# Patient Record
Sex: Female | Born: 1965 | Race: Black or African American | Hispanic: No | Marital: Single | Smoking: Former smoker
Health system: Southern US, Community
[De-identification: ages and names within clinical notes are randomized; demographics above are authoritative.]

## PROBLEM LIST (undated history)

## (undated) DIAGNOSIS — K219 Gastro-esophageal reflux disease without esophagitis: Secondary | ICD-10-CM

## (undated) DIAGNOSIS — G63 Polyneuropathy in diseases classified elsewhere: Secondary | ICD-10-CM

## (undated) DIAGNOSIS — Z21 Asymptomatic human immunodeficiency virus [HIV] infection status: Secondary | ICD-10-CM

## (undated) DIAGNOSIS — B20 Human immunodeficiency virus [HIV] disease: Secondary | ICD-10-CM

## (undated) DIAGNOSIS — F1411 Cocaine abuse, in remission: Secondary | ICD-10-CM

## (undated) DIAGNOSIS — R51 Headache: Secondary | ICD-10-CM

## (undated) DIAGNOSIS — I219 Acute myocardial infarction, unspecified: Secondary | ICD-10-CM

## (undated) DIAGNOSIS — I639 Cerebral infarction, unspecified: Secondary | ICD-10-CM

## (undated) DIAGNOSIS — D649 Anemia, unspecified: Secondary | ICD-10-CM

## (undated) DIAGNOSIS — K430 Incisional hernia with obstruction, without gangrene: Secondary | ICD-10-CM

## (undated) DIAGNOSIS — I10 Essential (primary) hypertension: Secondary | ICD-10-CM

## (undated) DIAGNOSIS — B029 Zoster without complications: Secondary | ICD-10-CM

## (undated) DIAGNOSIS — R519 Headache, unspecified: Secondary | ICD-10-CM

## (undated) DIAGNOSIS — M9989 Other biomechanical lesions of abdomen and other regions: Secondary | ICD-10-CM

## (undated) DIAGNOSIS — M62838 Other muscle spasm: Secondary | ICD-10-CM

## (undated) DIAGNOSIS — R221 Localized swelling, mass and lump, neck: Secondary | ICD-10-CM

## (undated) HISTORY — DX: Anemia, unspecified: D64.9

## (undated) HISTORY — DX: Human immunodeficiency virus (HIV) disease: B20

## (undated) HISTORY — DX: Localized swelling, mass and lump, neck: R22.1

## (undated) HISTORY — DX: Polyneuropathy in diseases classified elsewhere: G63

## (undated) HISTORY — DX: Other biomechanical lesions of abdomen and other regions: M99.89

## (undated) HISTORY — DX: Cerebral infarction, unspecified: I63.9

## (undated) HISTORY — DX: Other muscle spasm: M62.838

---

## 1986-11-01 HISTORY — PX: TONSILLECTOMY: SUR1361

## 1991-11-02 HISTORY — PX: ABDOMINAL HYSTERECTOMY: SHX81

## 2004-01-02 ENCOUNTER — Encounter (INDEPENDENT_AMBULATORY_CARE_PROVIDER_SITE_OTHER): Payer: Self-pay | Admitting: Infectious Diseases

## 2004-01-02 ENCOUNTER — Encounter (INDEPENDENT_AMBULATORY_CARE_PROVIDER_SITE_OTHER): Payer: Self-pay | Admitting: *Deleted

## 2004-01-02 ENCOUNTER — Encounter: Admission: RE | Admit: 2004-01-02 | Discharge: 2004-01-02 | Payer: Self-pay | Admitting: Infectious Diseases

## 2004-01-02 ENCOUNTER — Inpatient Hospital Stay (HOSPITAL_COMMUNITY): Admission: RE | Admit: 2004-01-02 | Discharge: 2004-01-20 | Payer: Self-pay | Admitting: Infectious Diseases

## 2004-01-02 LAB — CONVERTED CEMR LAB
CD4 Count: 60 microliters
CD4 T Cell Abs: 60

## 2004-01-04 IMAGING — CR DG CHEST 2V
2 series · 2 of 2 positions shown · non-contrast
Comparison: none

CLINICAL DATA: Chest pain with cough. 
 PA AND LATERAL CHEST [DATE]
 Without comparison studies. 
 Bronchial thickening with bibasilar subsegmental atelectasis.  Negative for focal infiltrates or effusions.  The heart is normal.  Mediastinum and hila are negative for adenopathy.  
 IMPRESSION
 Bronchitis, acute versus chronic with basilar subsegmental atelectasis.

[view not recorded (1 of 2)]
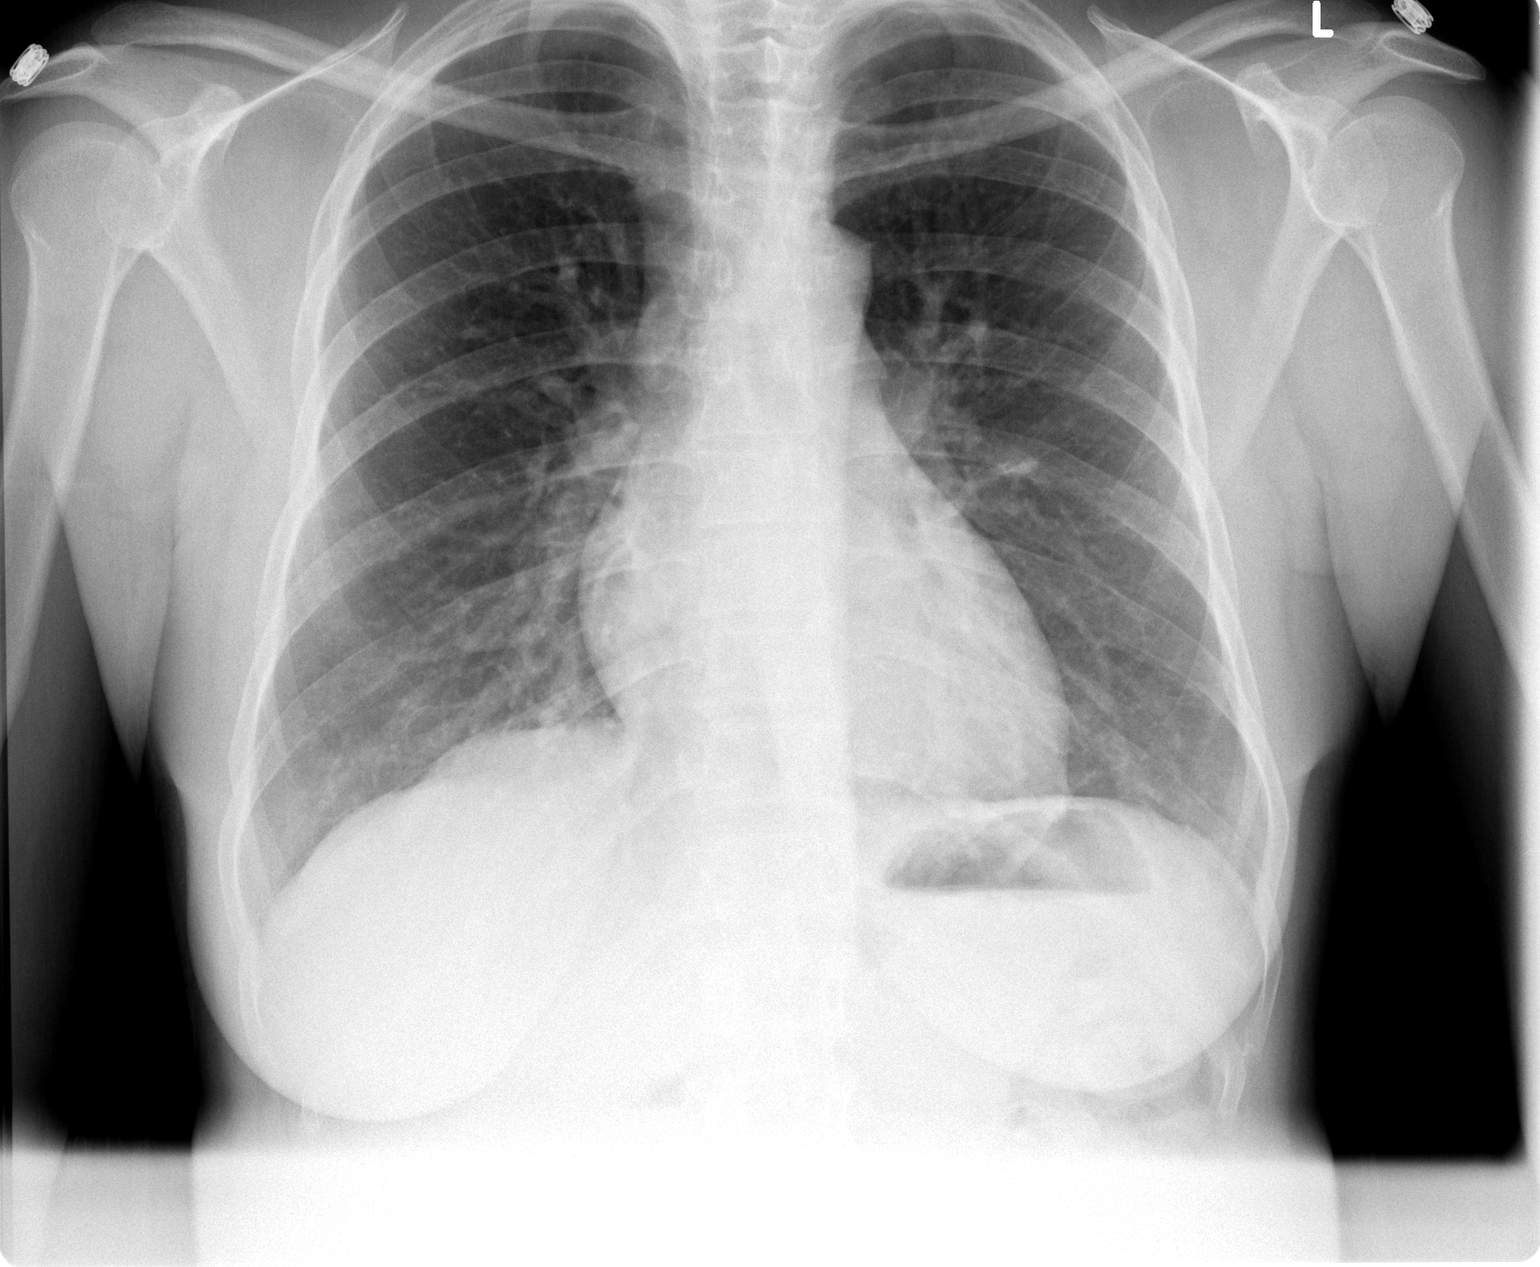

[view not recorded (2 of 2)]
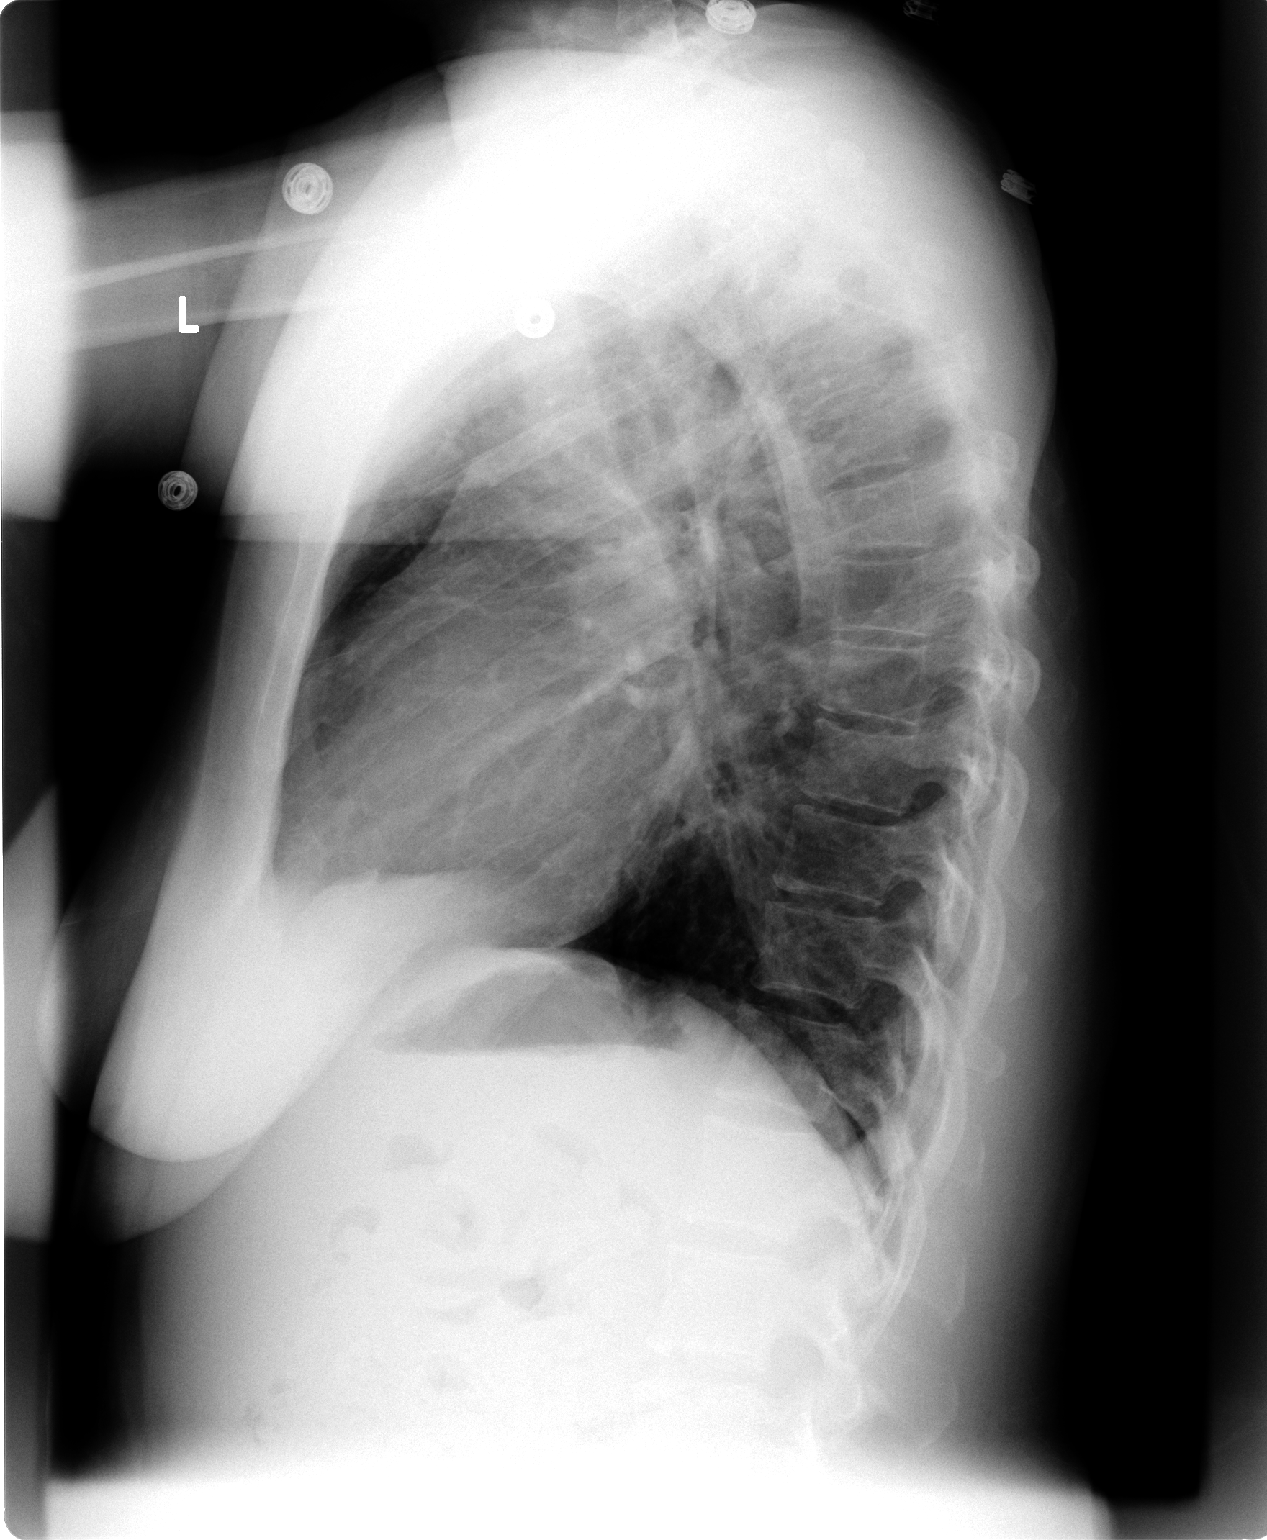

[2 of 2 positions shown; findings below may reference images not displayed]

## 2004-01-10 IMAGING — CR DG CHEST 2V
2 series · 2 of 2 positions shown · non-contrast
Comparison: none

CLINICAL DATA: Staph infection.
 PA AND LATERAL CHEST, [DATE]
 Compare study [DATE]
 The lungs are clear.  Cardiac and mediastinal contours are normal.  Osseous structures unremarkable.
 IMPRESSION 
 Negative for acute cardiac or pulmonary process.

[view not recorded (1 of 2)]
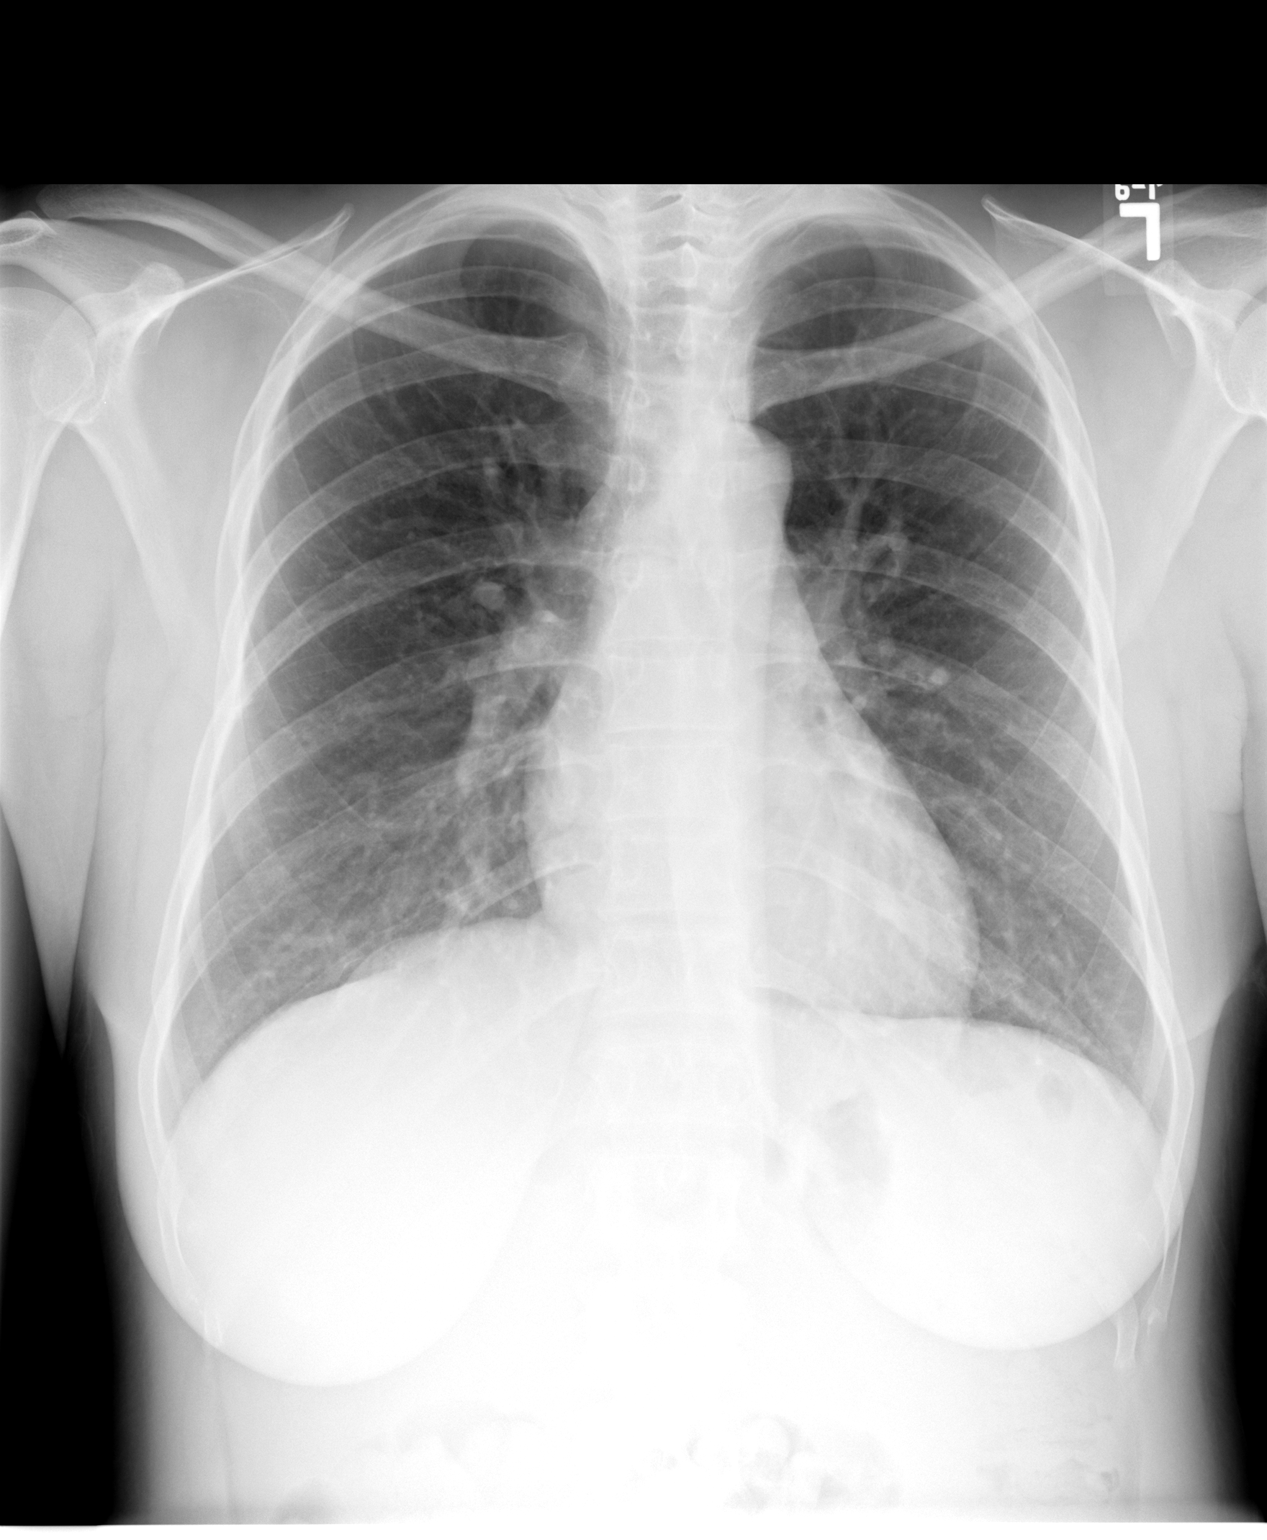

[view not recorded (2 of 2)]
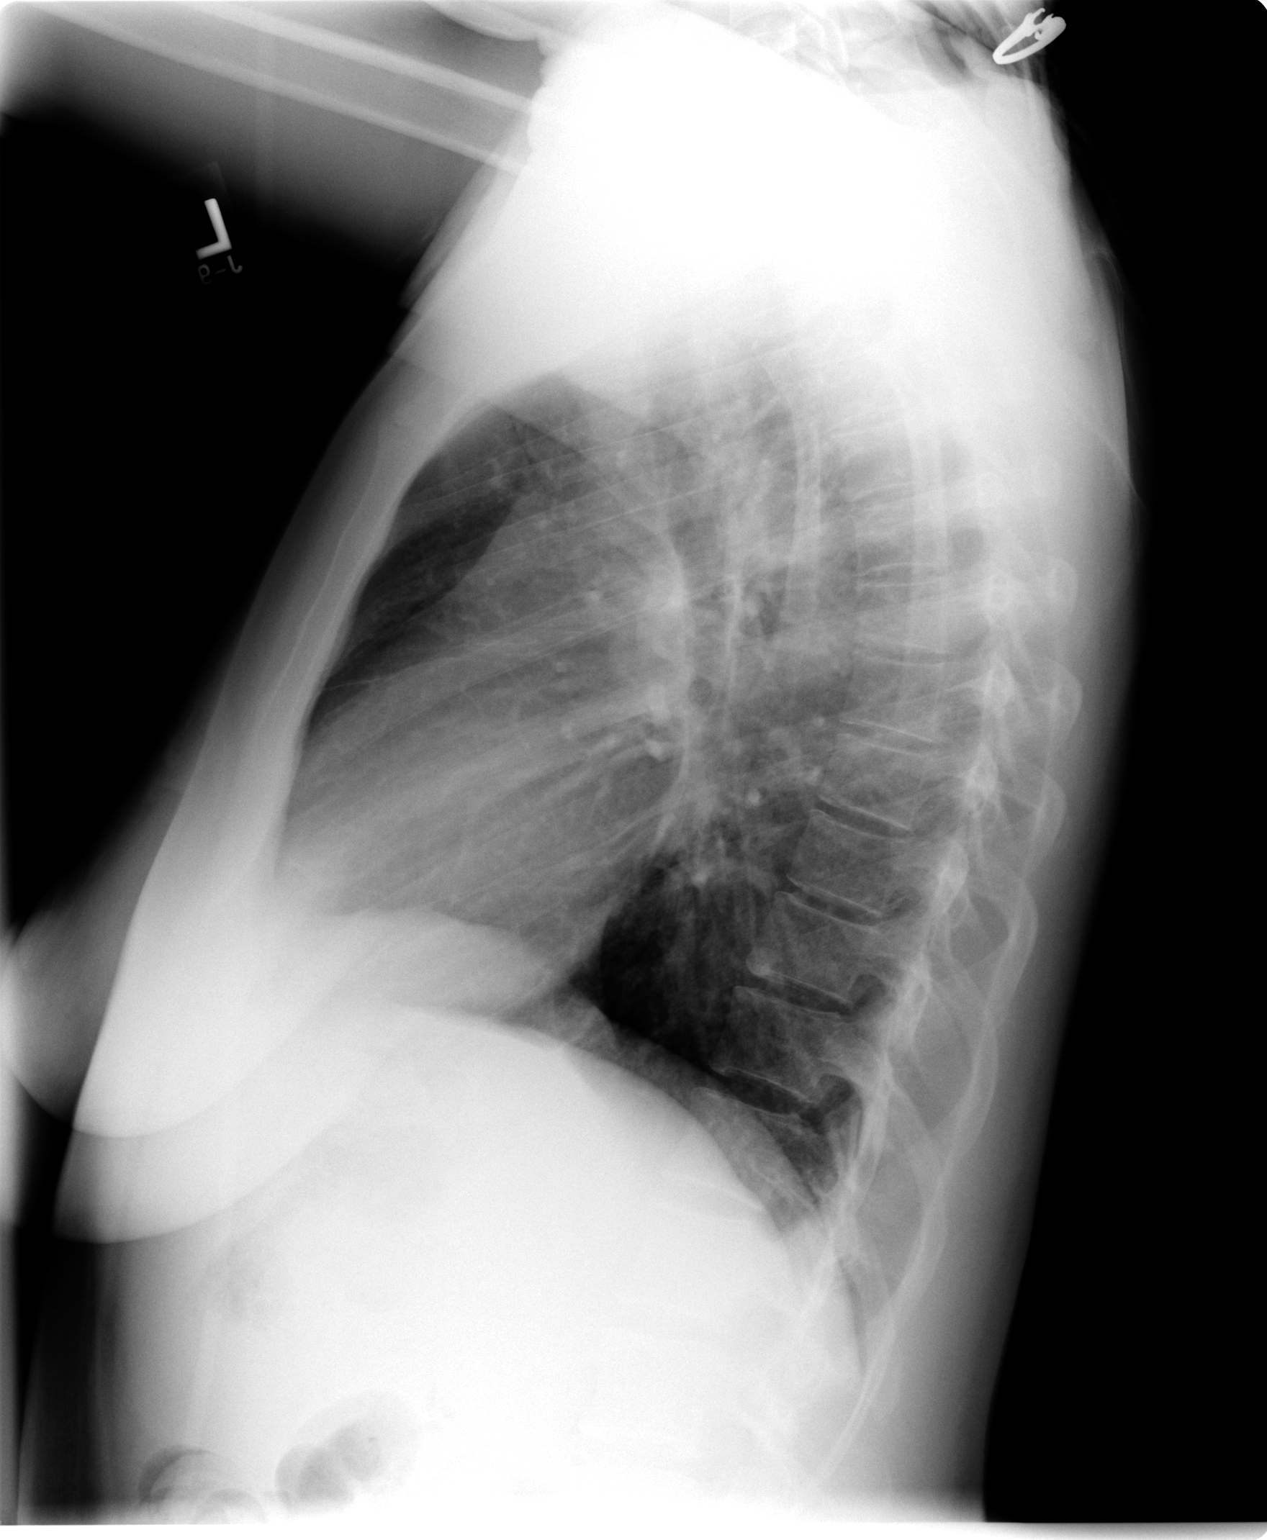

[2 of 2 positions shown; findings below may reference images not displayed]

## 2004-01-11 IMAGING — US US ABDOMEN COMPLETE
1 series · 14 of 25 positions shown · non-contrast
Comparison: none

CLINICAL DATA: Right upper abdominal pain.  
 ABDOMEN ULTRASOUND 
 There is no evidence of gallstones or gallbladder wall thickening. There is no evidence of biliary ductal dilatation. The liver is within normal limits in echogenicity and no focal parenchymal lesions are identified. The visualized portion of the pancreas is unremarkable in appearance.

[Series 1: abdomen · 0.27mm/px · 14 of 68 slices shown]
[im 1/68]
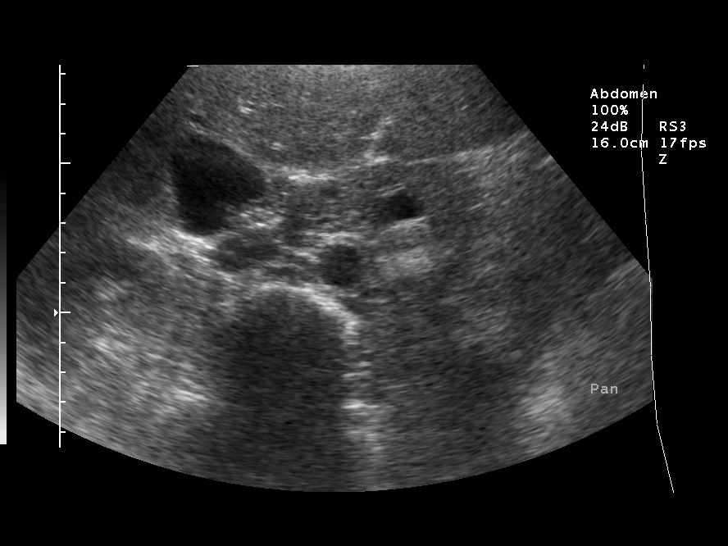
[im 6/68]
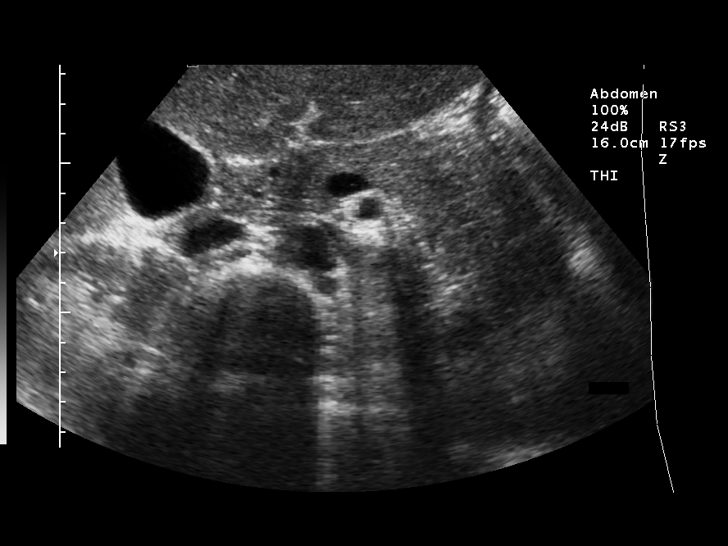
[im 12/68]
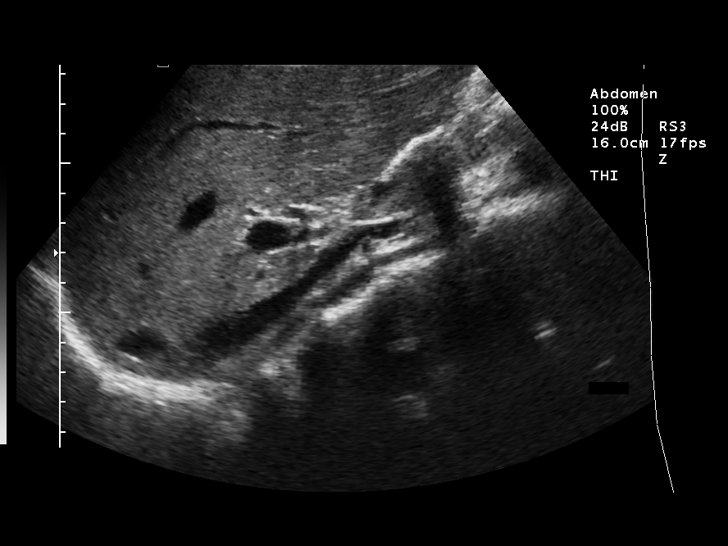
[im 17/68]
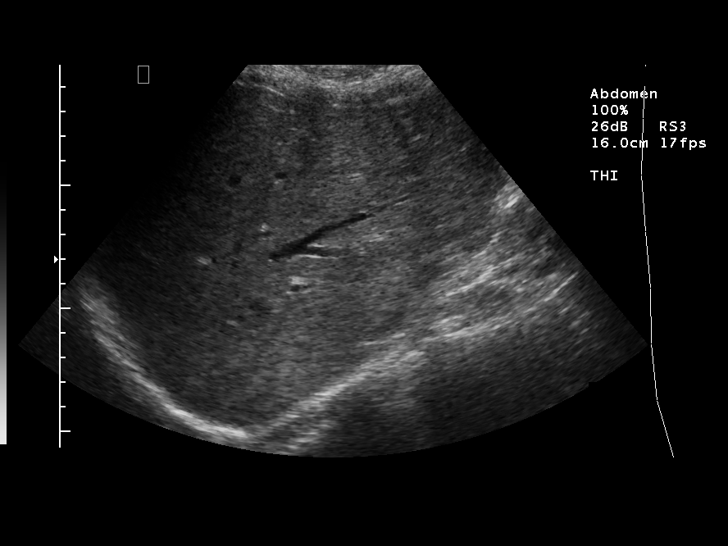
[im 23/68]
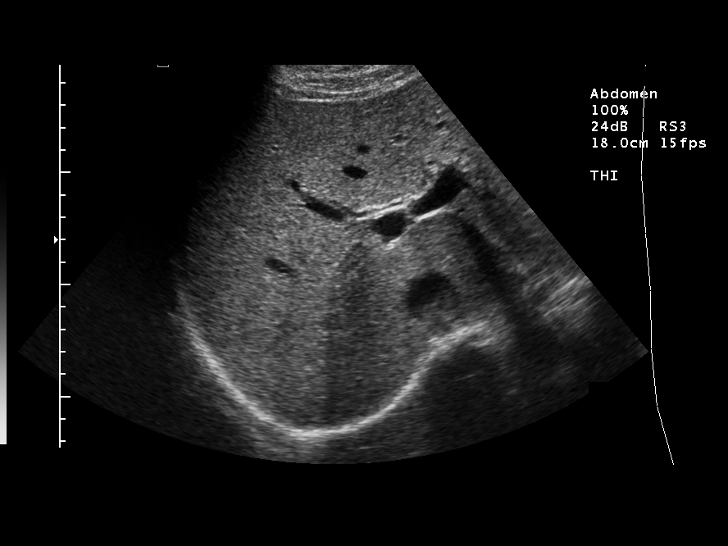
[im 26/68]
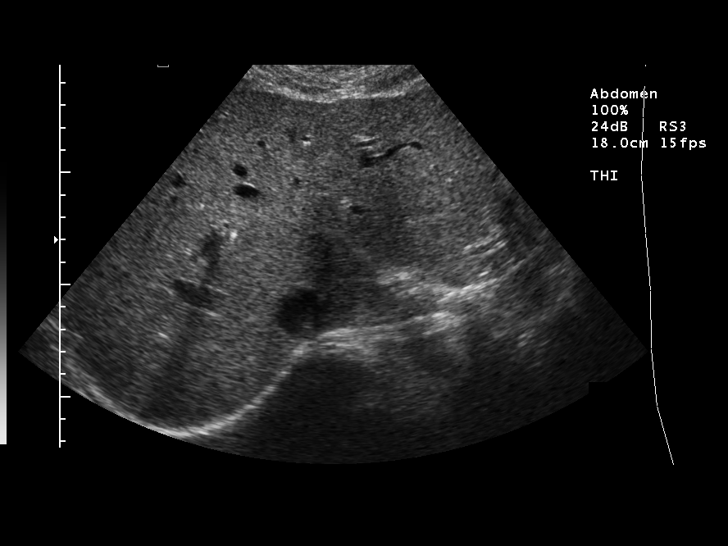
[im 31/68]
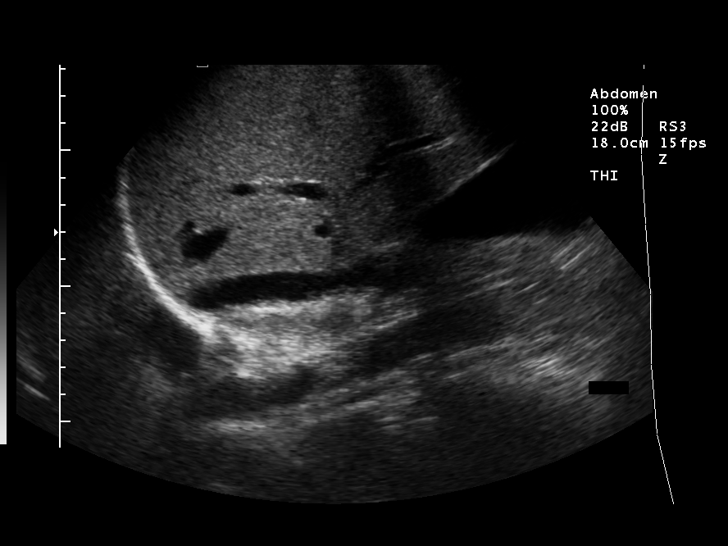
[im 37/68]
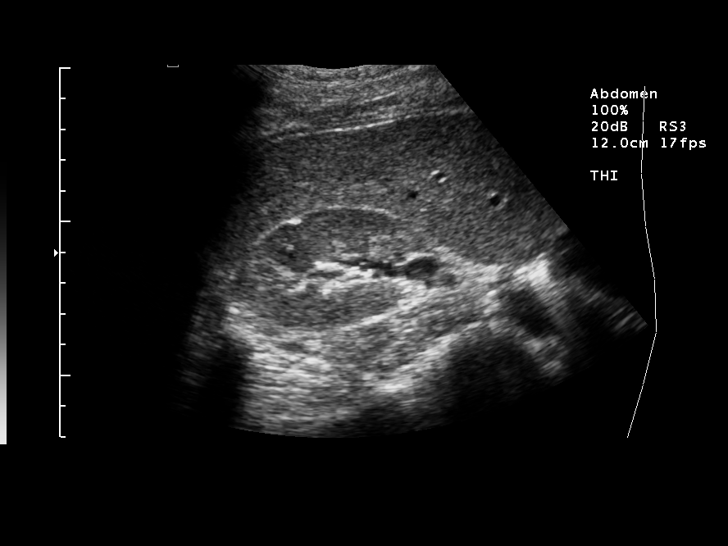
[im 42/68]
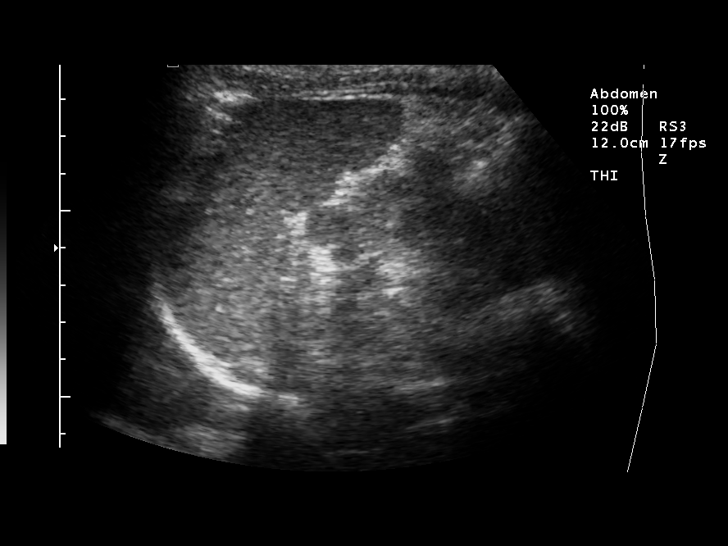
[im 45/68]
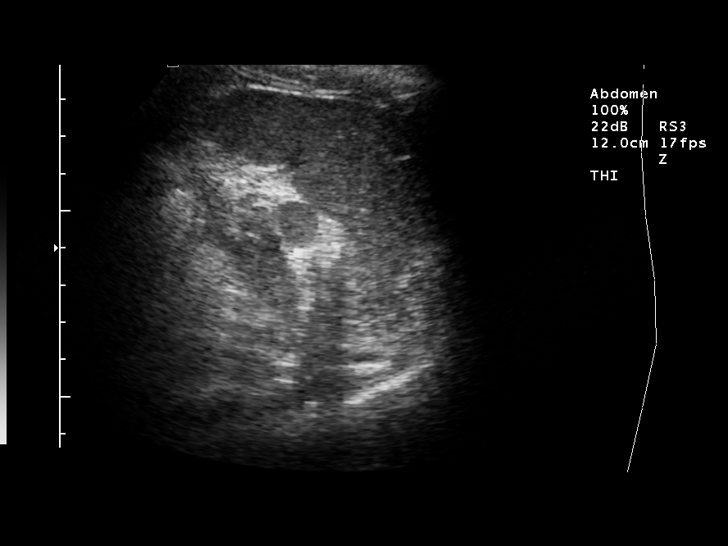
[im 51/68]
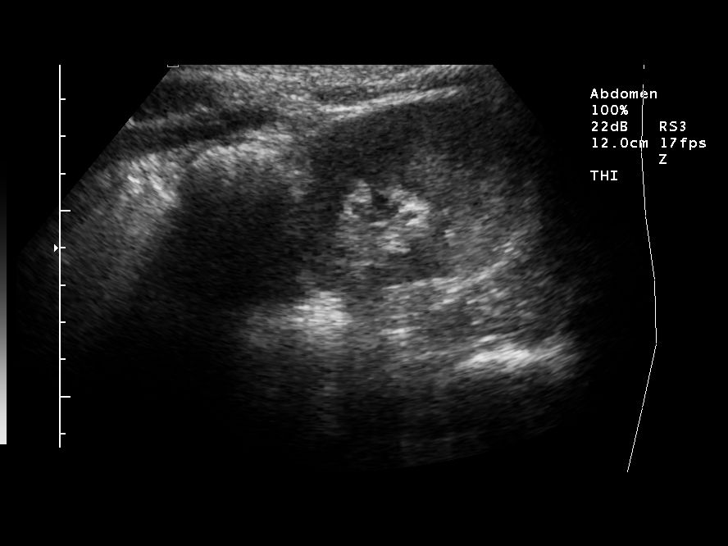
[im 56/68]
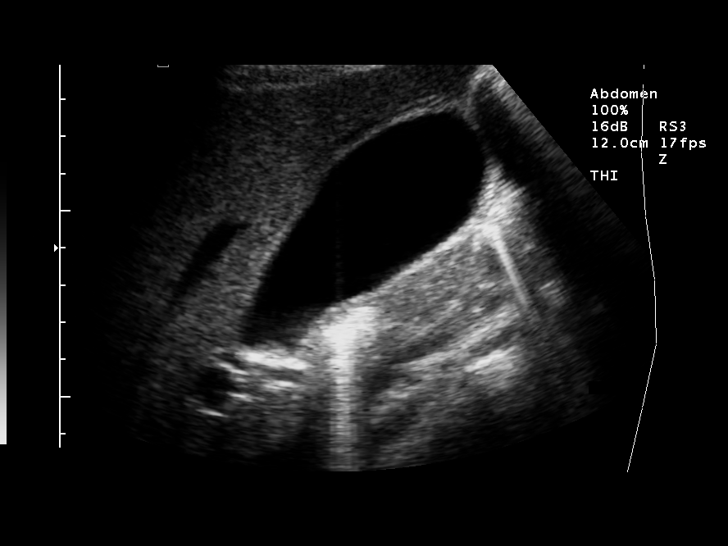
[im 62/68]
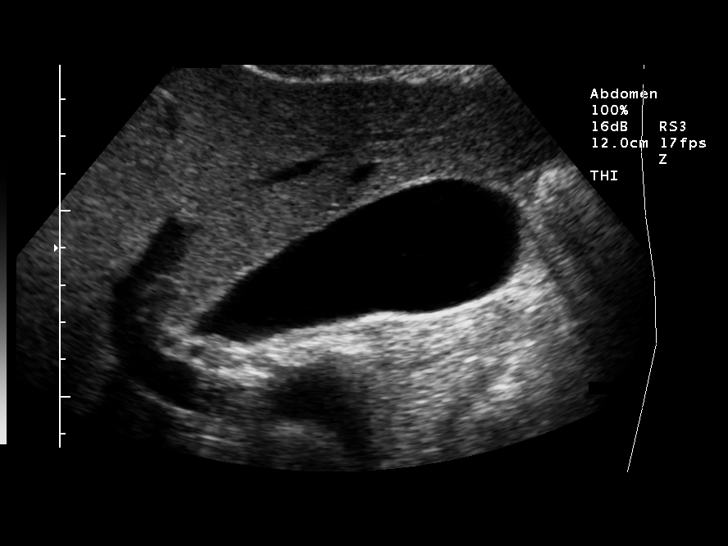
[im 68/68]
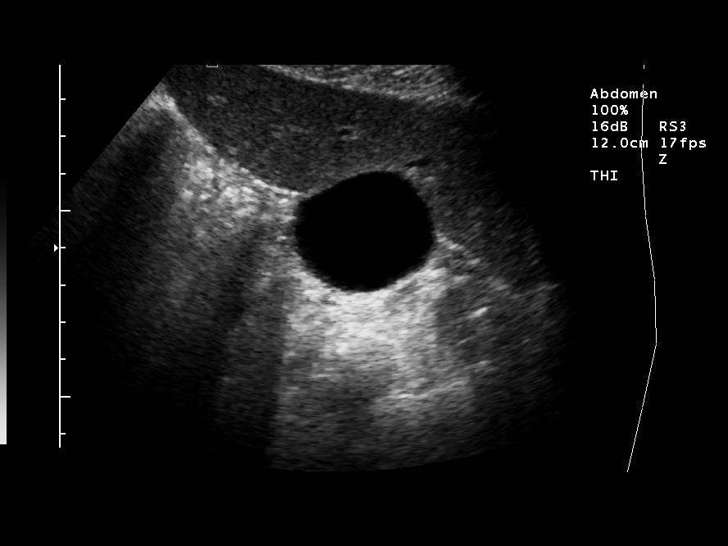

[14 of 25 positions shown; findings below may reference images not displayed]

The kidneys are within normal limits in size and echogenicity and there is no evidence of masses or hydronephrosis.  There is no evidence of splenomegaly or abdominal aortic aneurysm.  Images of the inferior vena cava are unremarkable, and there is no evidence of ascites.

 IMPRESSION
 Negative abdominal ultrasound.

## 2004-01-14 IMAGING — CT CT PELVIS W/ CM
1 series · 15 of 32 positions shown, 19 images · IV contrast (GASTROGRAFIN & 100 ML OMNI 300)
Comparison: none

CLINICAL DATA: Liver dysfunction and lymphadenopathy.  
CT ABDOMEN AND PELVIS WITH CONTRAST
TECHNIQUE: Multidetector helical imaging carried out through the abdomen and pelvis utilizing oral and IV contrast (100 cc Omnipaque 300).  No prior studies for comparison.  
After scanning the patient initially, the decision was made to re-scan her after additional oral contrast.  
ABDOMEN WITH CONTRAST
Lung bases clear.  No focal lesions of the liver or spleen.  No calcified gallstones or bile duct dilatation.  Adrenals and kidneys unremarkable.  I do not see any significant retroperitoneal adenopathy.  Initially there was a question of adenopathy but the delayed CT with additional oral contrast showed that these are small bowel loops that are now contrast filled.  No ascites.  
IMPRESSION
No significant findings in the upper abdomen ? there was initially a question of retroperitoneal adenopathy, but delayed scans after additional oral contrast show that these areas are bowel. 
PELVIS WITH CONTRAST
Just above the bladder, seen on images 63 through 70 of the initial scan, there appear to be multiloculated cystic lesions.  These are likely ovarian in etiology.  Cannot rule out multiloculated pelvic or tubo-ovarian  abscesses.  Pelvic ultrasound is indicated to further assess these abnormalities.  No free ascites or other focal abnormality.  
Multiseptated cystic lesions in the deep pelvis bilaterally.  Probably ovarian.  Pelvic ultrasound is needed for further assessment.

[Series 2: abd pelvis · axial · 0.62mm/px · z∈[-428,-93]mm · 15 of 175 slices shown, 19 images]
[im 12/175  soft-tissue]
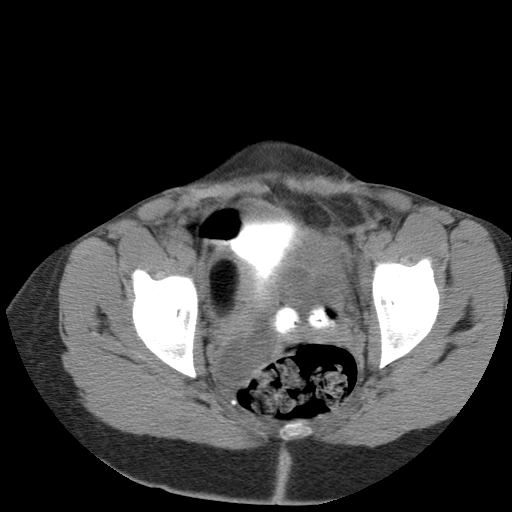
[im 12/175  bone]
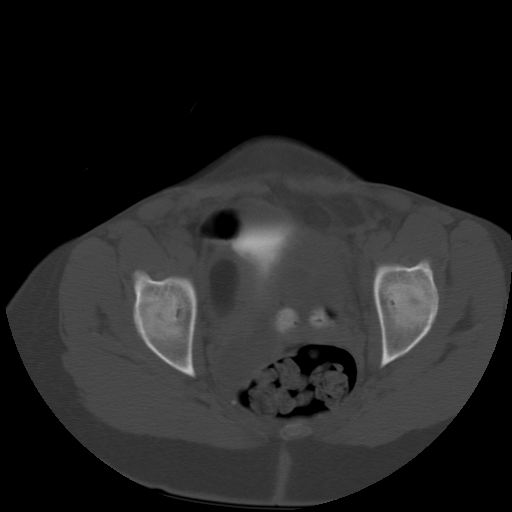
[im 23/175  soft-tissue]
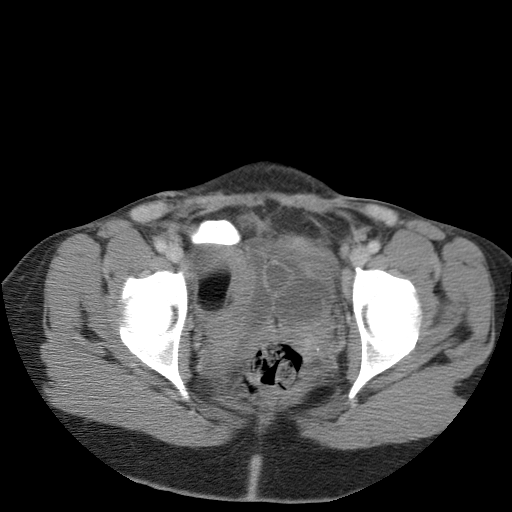
[im 34/175  soft-tissue]
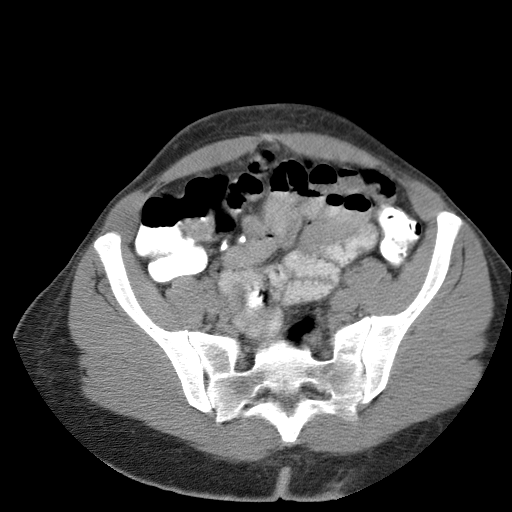
[im 51/175  soft-tissue]
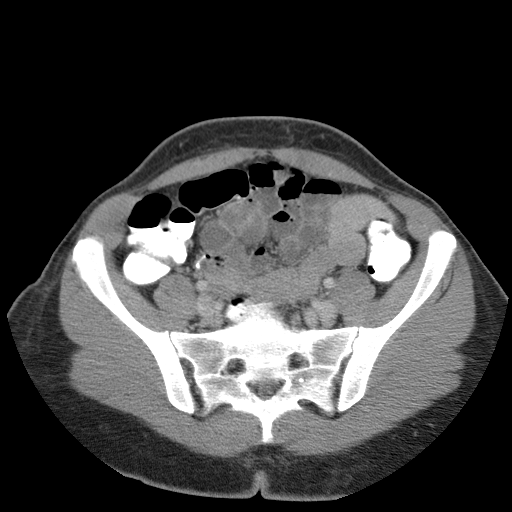
[im 62/175  soft-tissue]
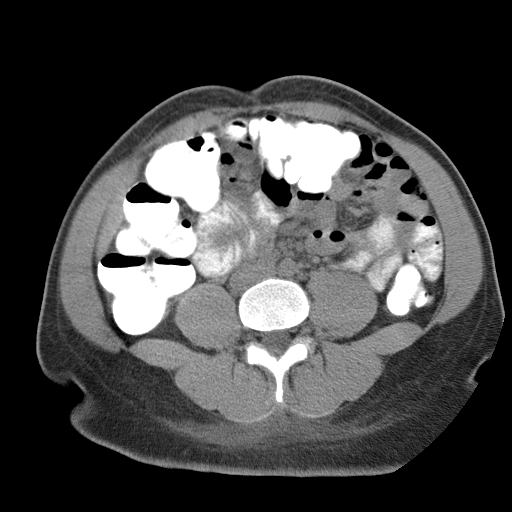
[im 73/175  soft-tissue]
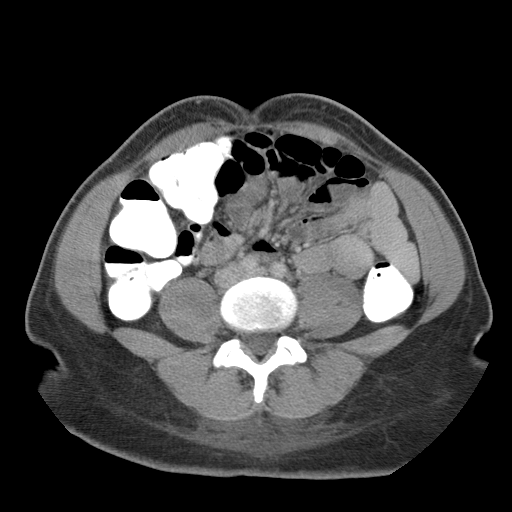
[im 90/175  soft-tissue]
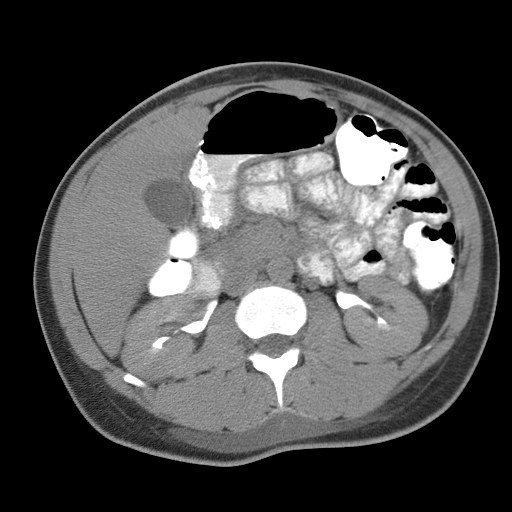
[im 102/175  soft-tissue]
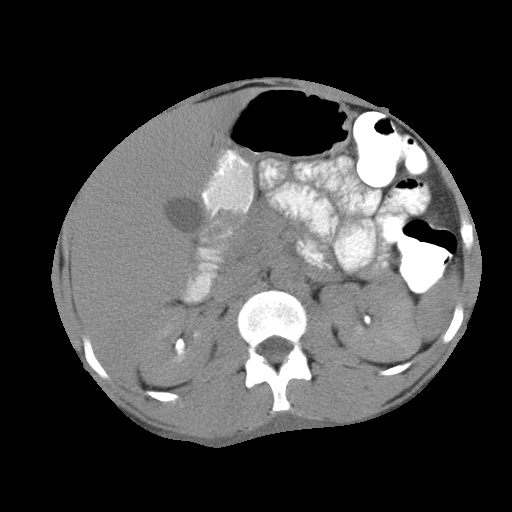
[im 113/175  soft-tissue]
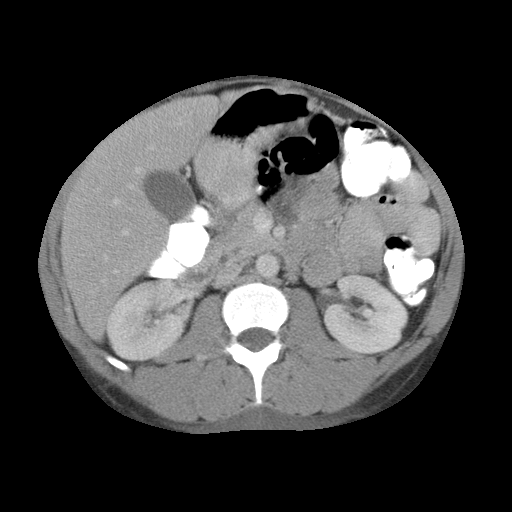
[im 113/175  bone]
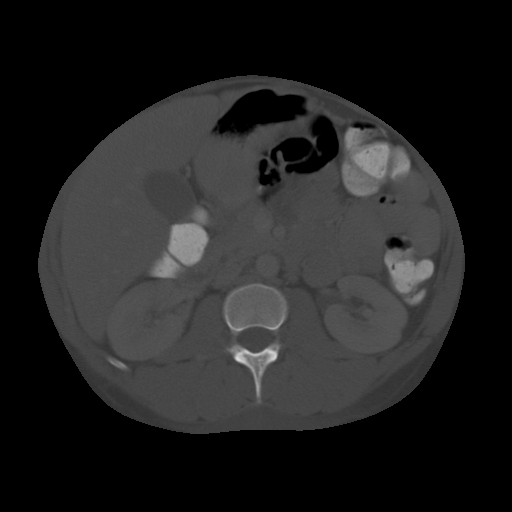
[im 124/175  soft-tissue]
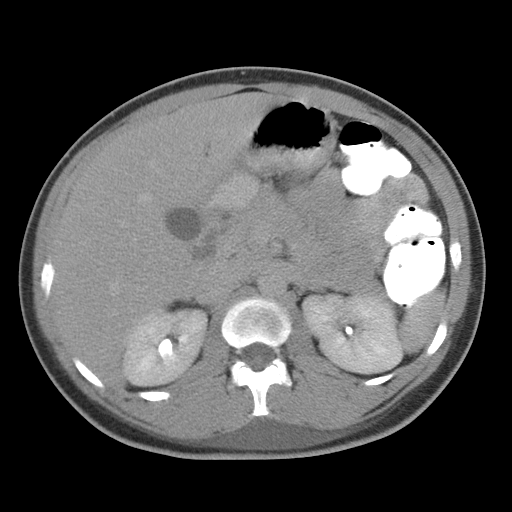
[im 141/175  soft-tissue]
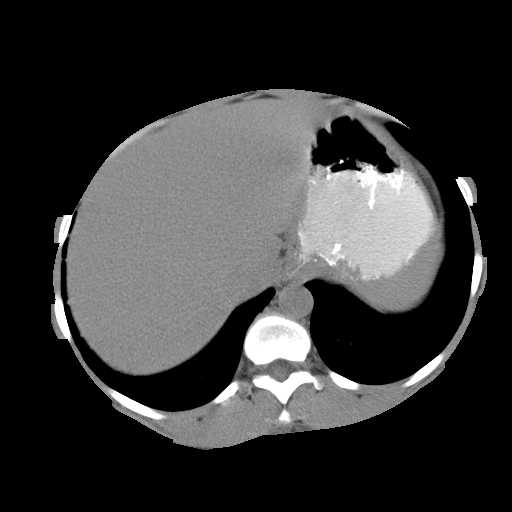
[im 152/175  soft-tissue]
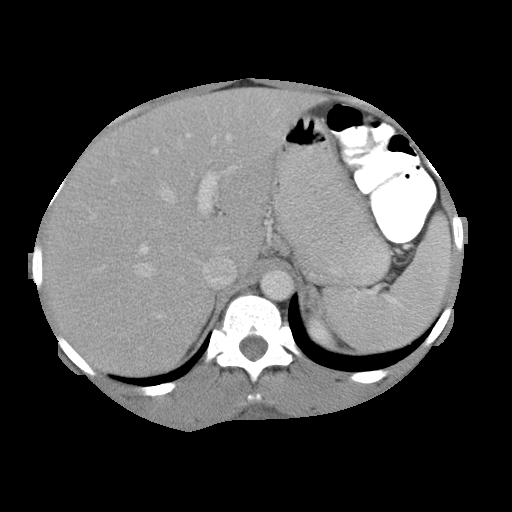
[im 152/175  lung]
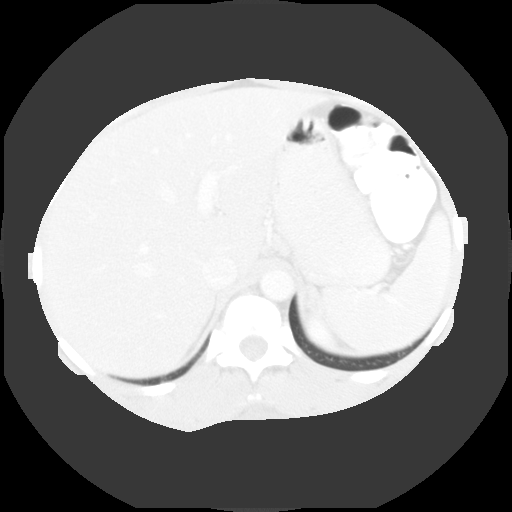
[im 158/175  lung]
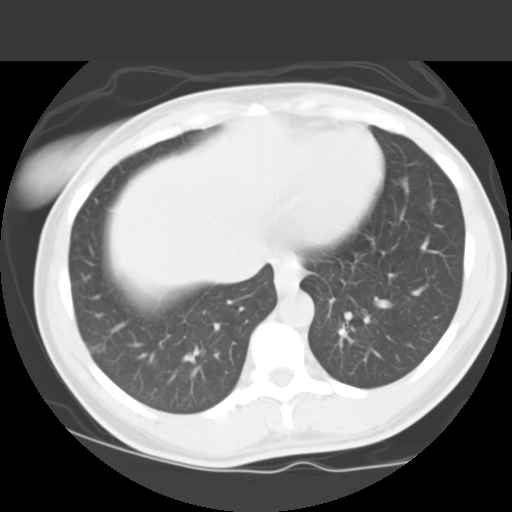
[im 163/175  soft-tissue]
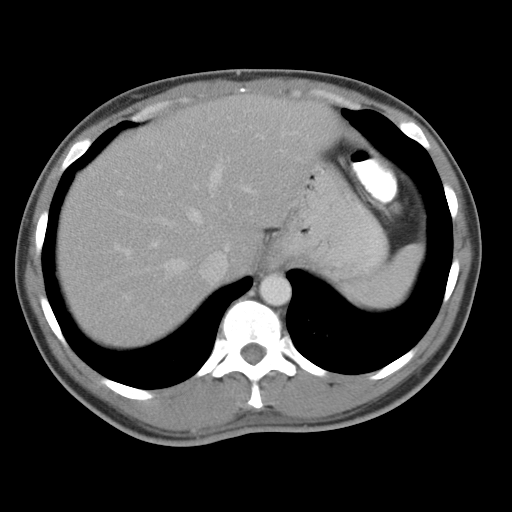
[im 163/175  lung]
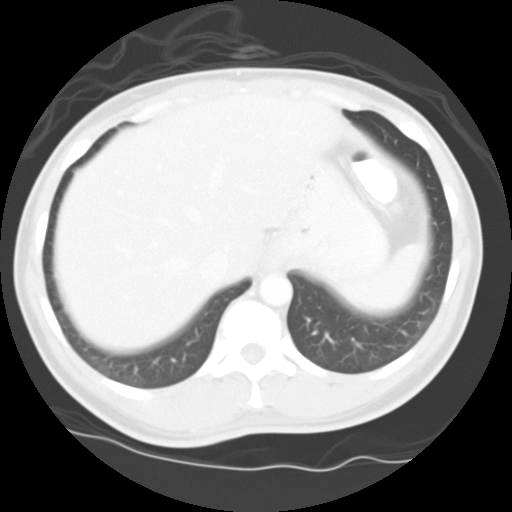
[im 169/175  lung]
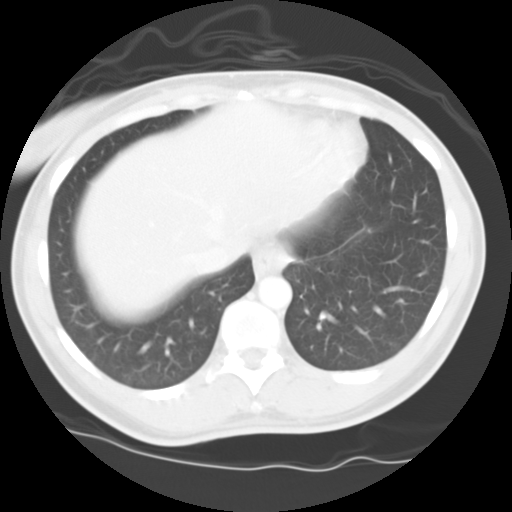

[15 of 32 positions shown; findings below may reference images not displayed]

## 2004-01-14 IMAGING — XA IR CV CATH FLUORO GUIDE
1 series · 1 of 1 positions shown · non-contrast
Comparison: none

CLINICAL DATA: HIV and MRSA infection and sepsis.  The patient requires PICC line for long-term IV antibiotics. 
 Procedure:  
 SINGLE LUMEN RIGHT UPPER EXTREMITY PICC LINE WITH FLUOROSCOPIC AND ULTRASOUND GUIDANCE
TECHNIQUE: The right arm was prepped with Betadine, draped in the usual sterile fashion, and infiltrated locally with 1% Lidocaine.  Ultrasound demonstrated patency of the right basilic vein.  Under real-time ultrasound guidance, this vein was accessed with a 21 gauge micropuncture needle.  Ultrasound image documentation was performed.  The needle was exchanged over a guidewire for a peel-away sheath, through which a 5 French single lumen PICC catheter trimmed to 36 cm was advanced, positioned with its tip at the distal SVC/right atrial junction.  Fluoroscopy during the procedure and fluoro spot radiograph confirms appropriate catheter tip position.  The catheter was flushed, secured to the skin with Prolene sutures, and covered with a sterile dressing.  No immediate complication.   Fluoro time:  0.6 minutes. 
 IMPRESSION
 Technically successful right arm PICC placement with ultrasound and fluoroscopic guidance.  Ready for routine use.

[Series 1: run · 1 of 1 slices shown]
[im 1/1]
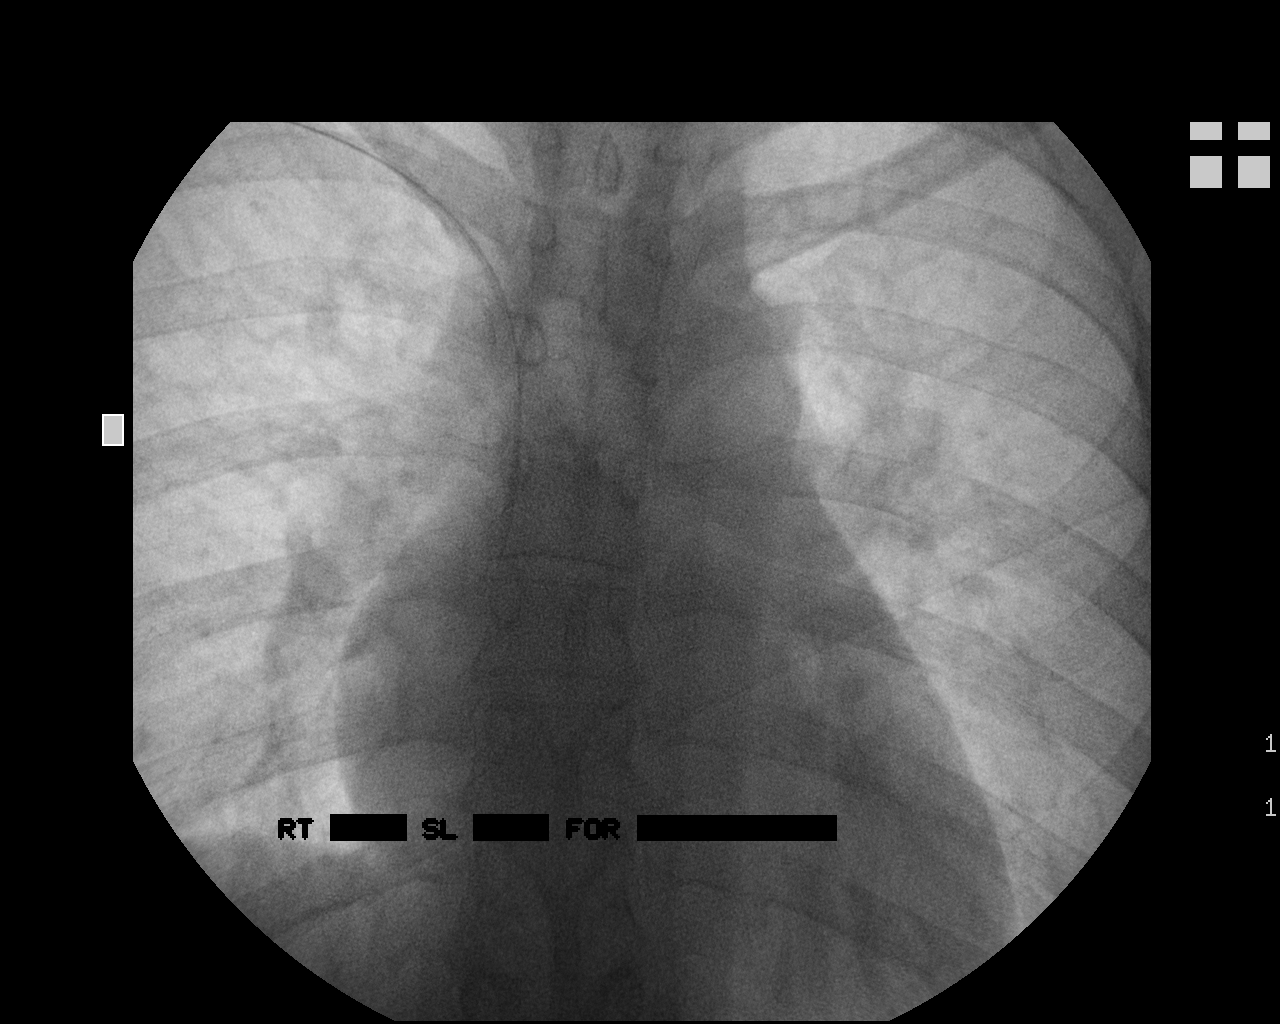

[1 of 1 positions shown; findings below may reference images not displayed]

## 2004-01-15 ENCOUNTER — Encounter (INDEPENDENT_AMBULATORY_CARE_PROVIDER_SITE_OTHER): Payer: Self-pay | Admitting: *Deleted

## 2004-01-15 IMAGING — US US TRANSVAGINAL NON-OB
1 series · 14 of 25 positions shown · non-contrast
Comparison: none

CLINICAL DATA: Cystic left pelvic structure on CT examination yesterday. 
 TRANSABDOMINAL AND TRANSVAGINAL PELVIC ULTRASOUND 
 Initially, transabdominal imaging was performed using the bladder as an acoustical window.  There is a tubular structure in the left adnexa consistent with a fluid-filled fallopian tube.  No adnexal masses or free fluid are identified. 
 Subsequently, the bladder was emptied and transvaginal imaging was performed.  The uterus is surgically absent.  The transvaginal images confirm a hydrosalpinx on the left.  The left ovary was not identified.  The right ovary was identified and contains a complex cyst which measures 3.3 x 1.6 x 1.9 cm.  The ovary itself is normal in size measuring 3.8 x 2.5 x 2.7 cm.  
 IMPRESSION
 1.  Left hydrosalpinx.
 2.  Probable hemorrhagic cyst involving the right ovary. 
 3.  Nonvisualization of the left ovary.
 4.  Status-post hysterectomy.

[Series 1: unknown · 0.35mm/px · 14 of 47 slices shown]
[im 1/47]
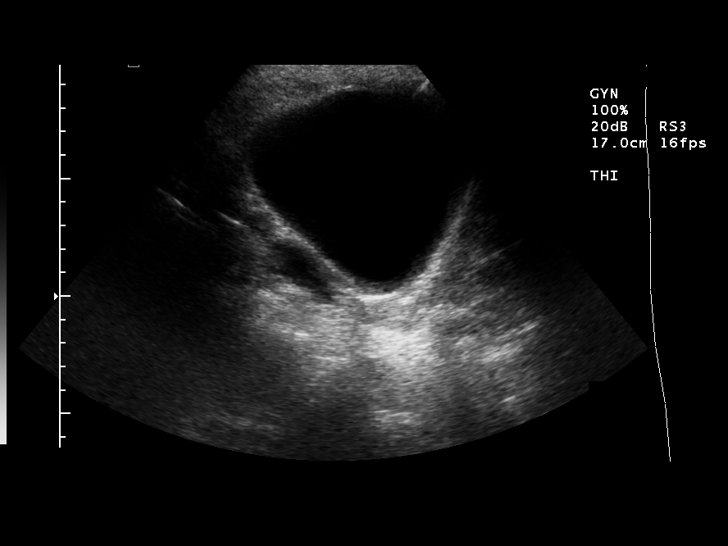
[im 4/47]
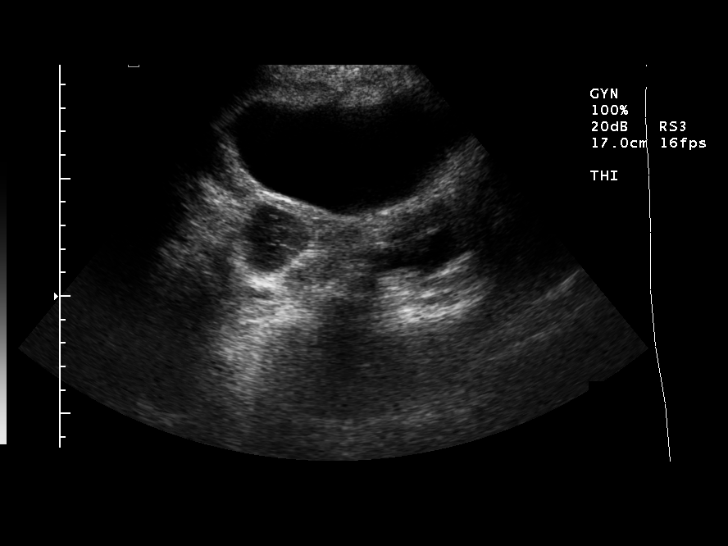
[im 8/47]
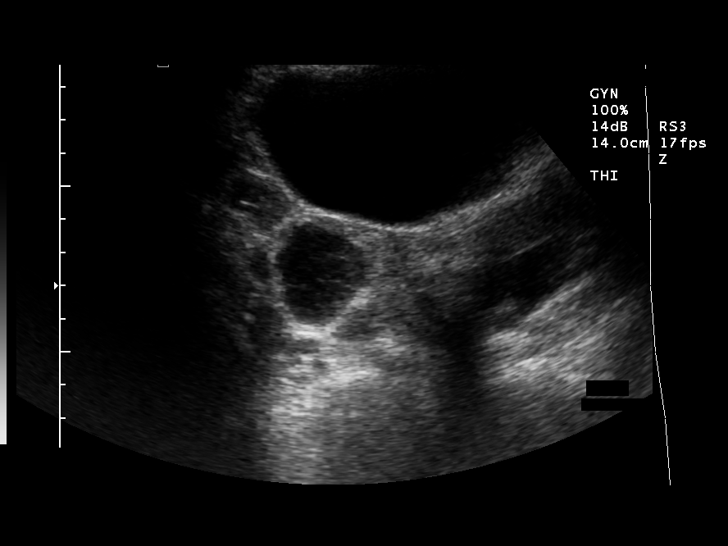
[im 12/47]
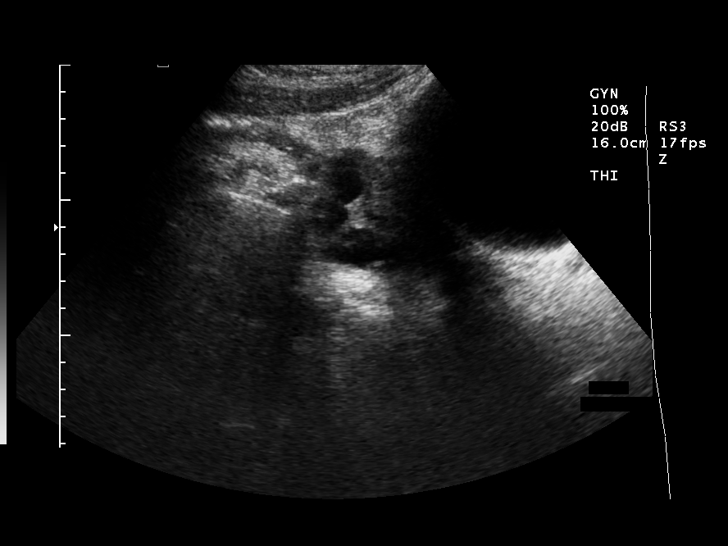
[im 16/47]
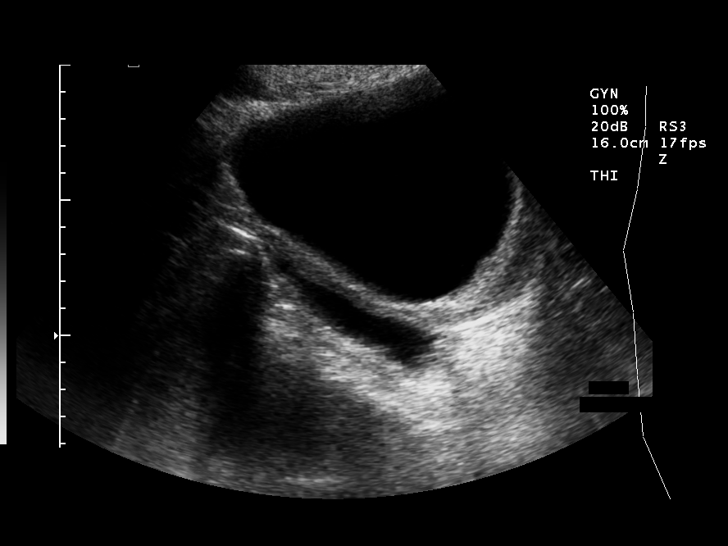
[im 18/47]
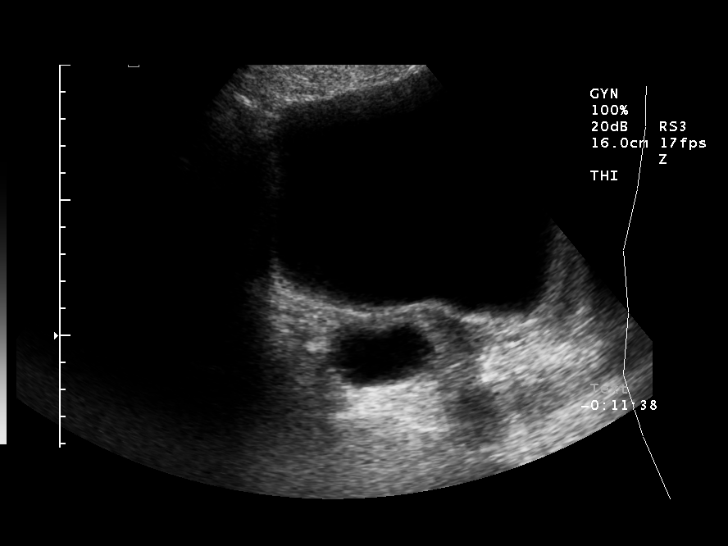
[im 22/47]
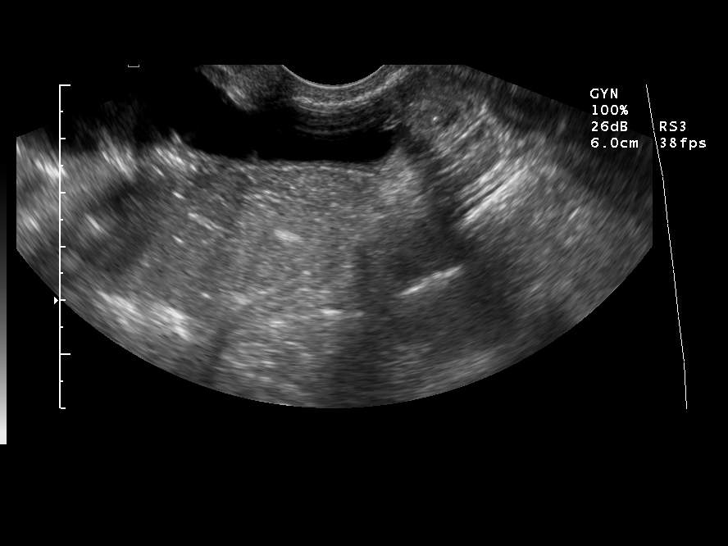
[im 25/47]
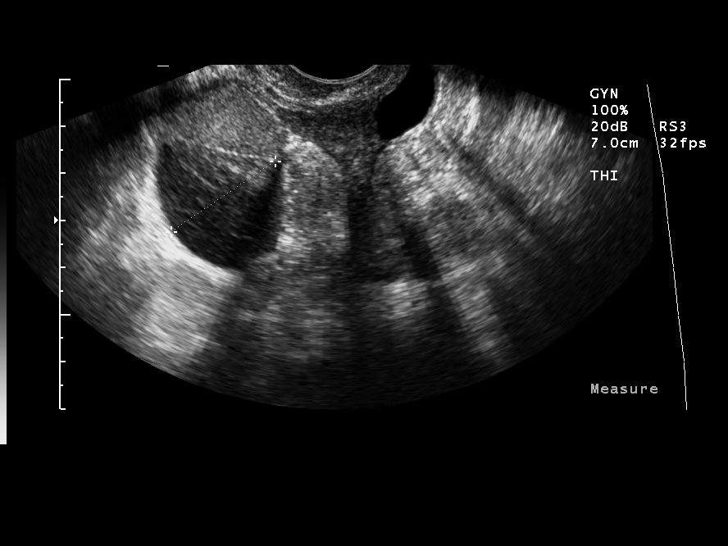
[im 29/47]
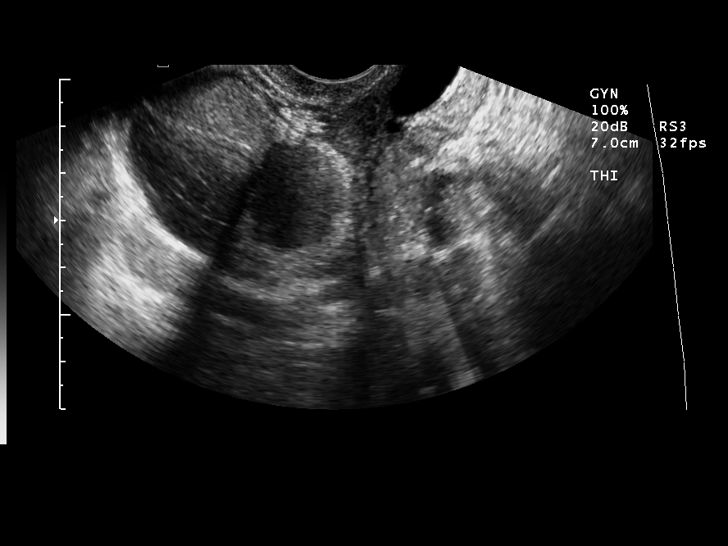
[im 31/47]
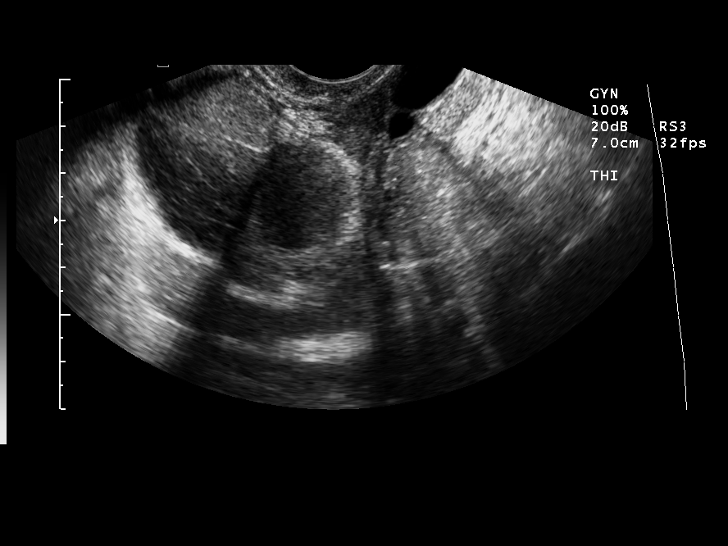
[im 35/47]
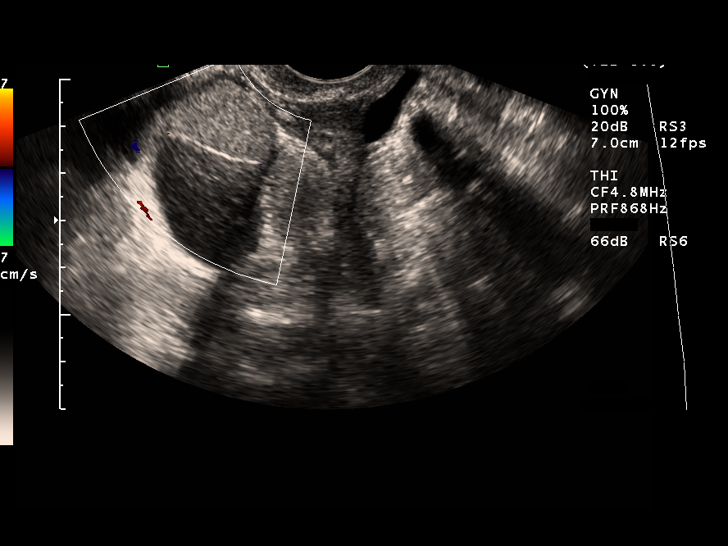
[im 39/47]
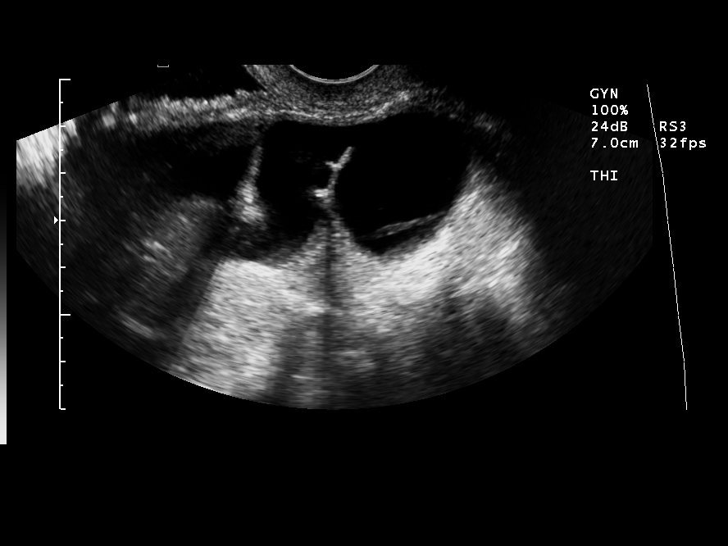
[im 43/47]
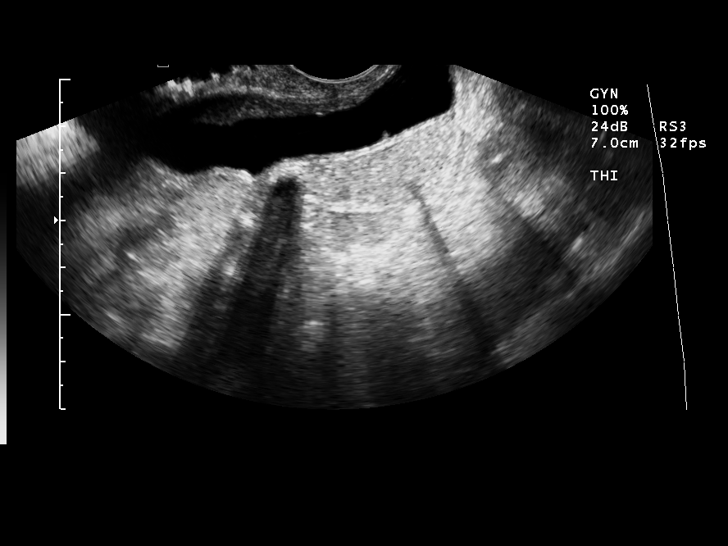
[im 47/47]
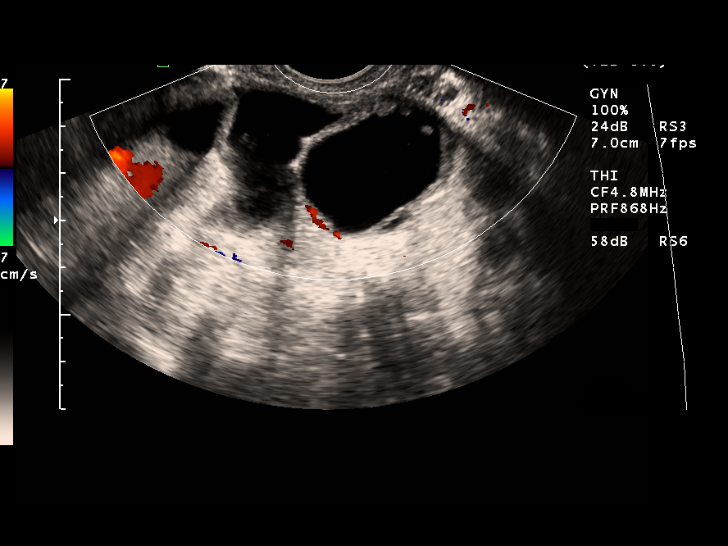

[14 of 25 positions shown; findings below may reference images not displayed]

## 2004-03-11 ENCOUNTER — Encounter: Admission: RE | Admit: 2004-03-11 | Discharge: 2004-03-11 | Payer: Self-pay | Admitting: Infectious Diseases

## 2004-04-06 ENCOUNTER — Encounter: Admission: RE | Admit: 2004-04-06 | Discharge: 2004-04-06 | Payer: Self-pay | Admitting: Infectious Diseases

## 2004-08-03 ENCOUNTER — Ambulatory Visit: Payer: Self-pay | Admitting: Infectious Diseases

## 2004-08-03 ENCOUNTER — Ambulatory Visit (HOSPITAL_COMMUNITY): Admission: RE | Admit: 2004-08-03 | Discharge: 2004-08-03 | Payer: Self-pay | Admitting: Infectious Diseases

## 2004-08-17 ENCOUNTER — Ambulatory Visit: Payer: Self-pay | Admitting: Infectious Diseases

## 2004-09-23 ENCOUNTER — Ambulatory Visit: Payer: Self-pay | Admitting: Internal Medicine

## 2004-09-23 ENCOUNTER — Ambulatory Visit: Payer: Self-pay | Admitting: Infectious Diseases

## 2004-09-23 ENCOUNTER — Inpatient Hospital Stay (HOSPITAL_COMMUNITY): Admission: AD | Admit: 2004-09-23 | Discharge: 2004-09-28 | Payer: Self-pay | Admitting: Internal Medicine

## 2004-09-24 ENCOUNTER — Ambulatory Visit: Payer: Self-pay | Admitting: Infectious Diseases

## 2004-09-24 IMAGING — CR DG CHEST 2V
2 series · 2 of 2 positions shown · non-contrast
Comparison: none

CLINICAL DATA: MRSA, chest pain, dyspnea.  
 TWO VIEW CHEST ? [DATE]:
 Comparison to [DATE].

[view not recorded (1 of 2)]
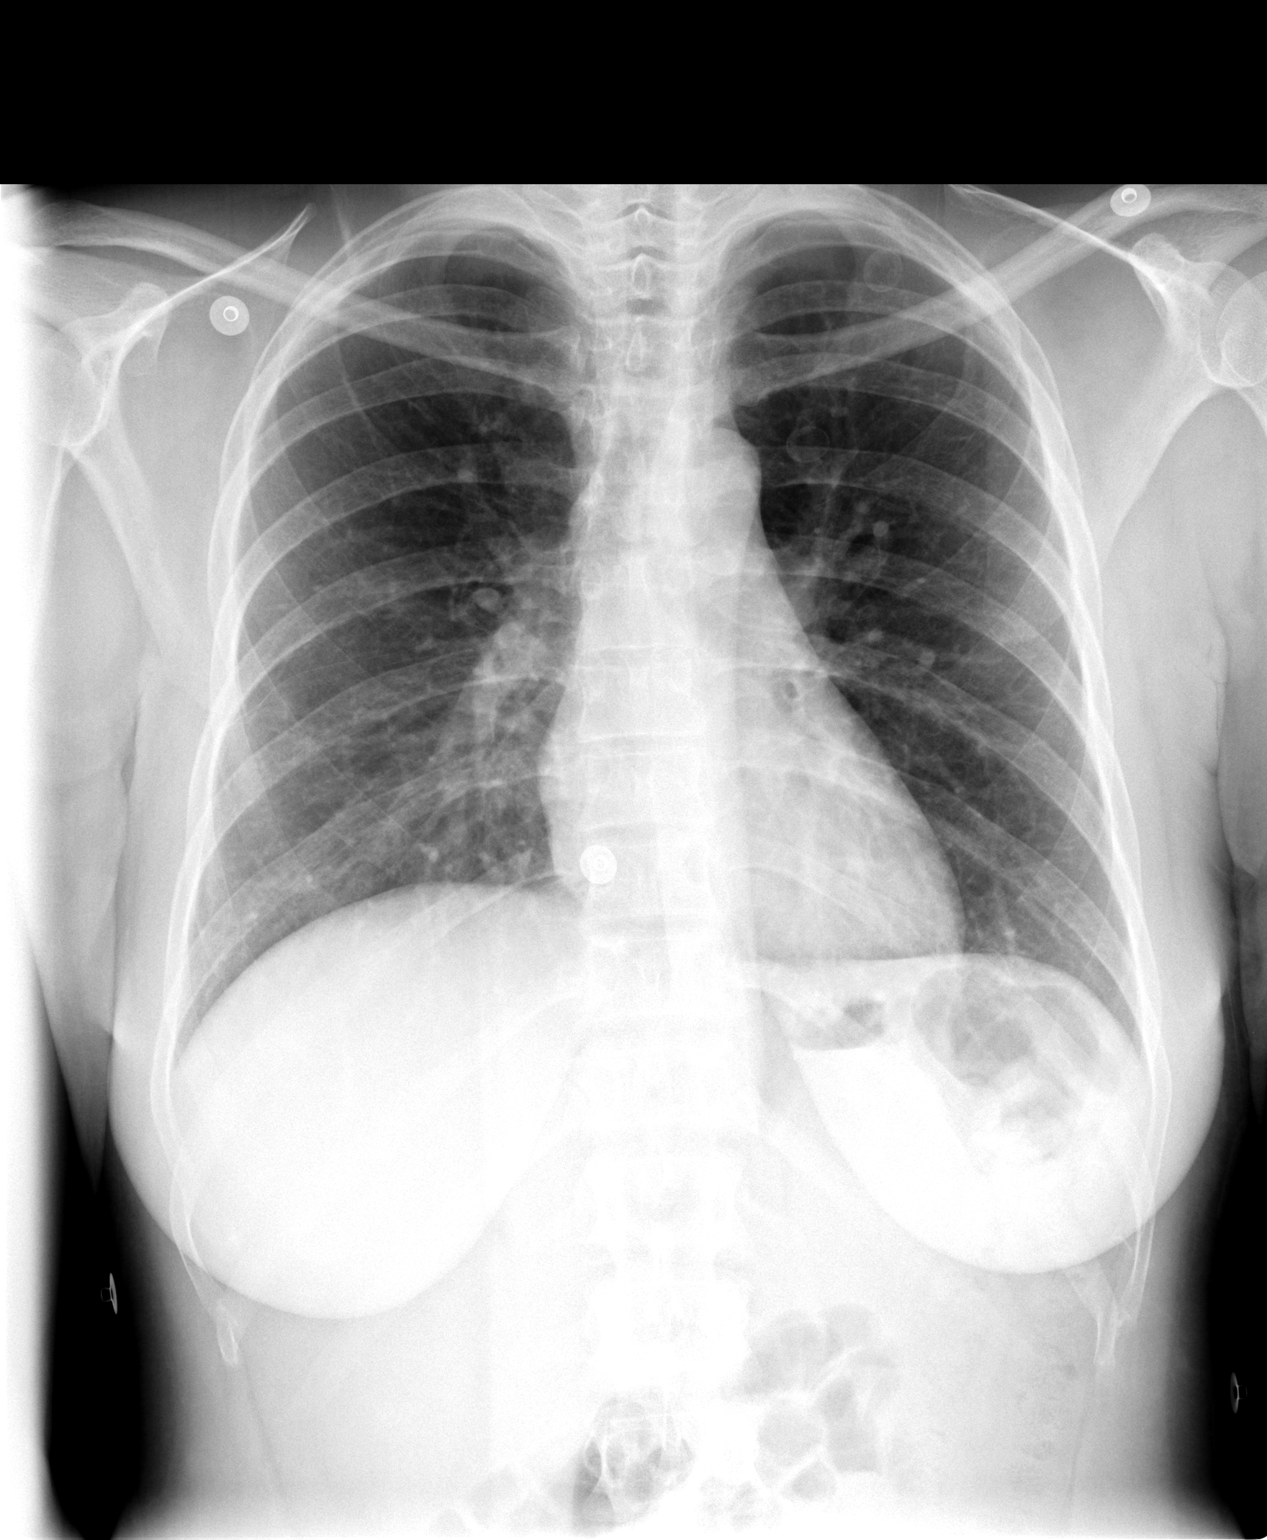

[view not recorded (2 of 2)]
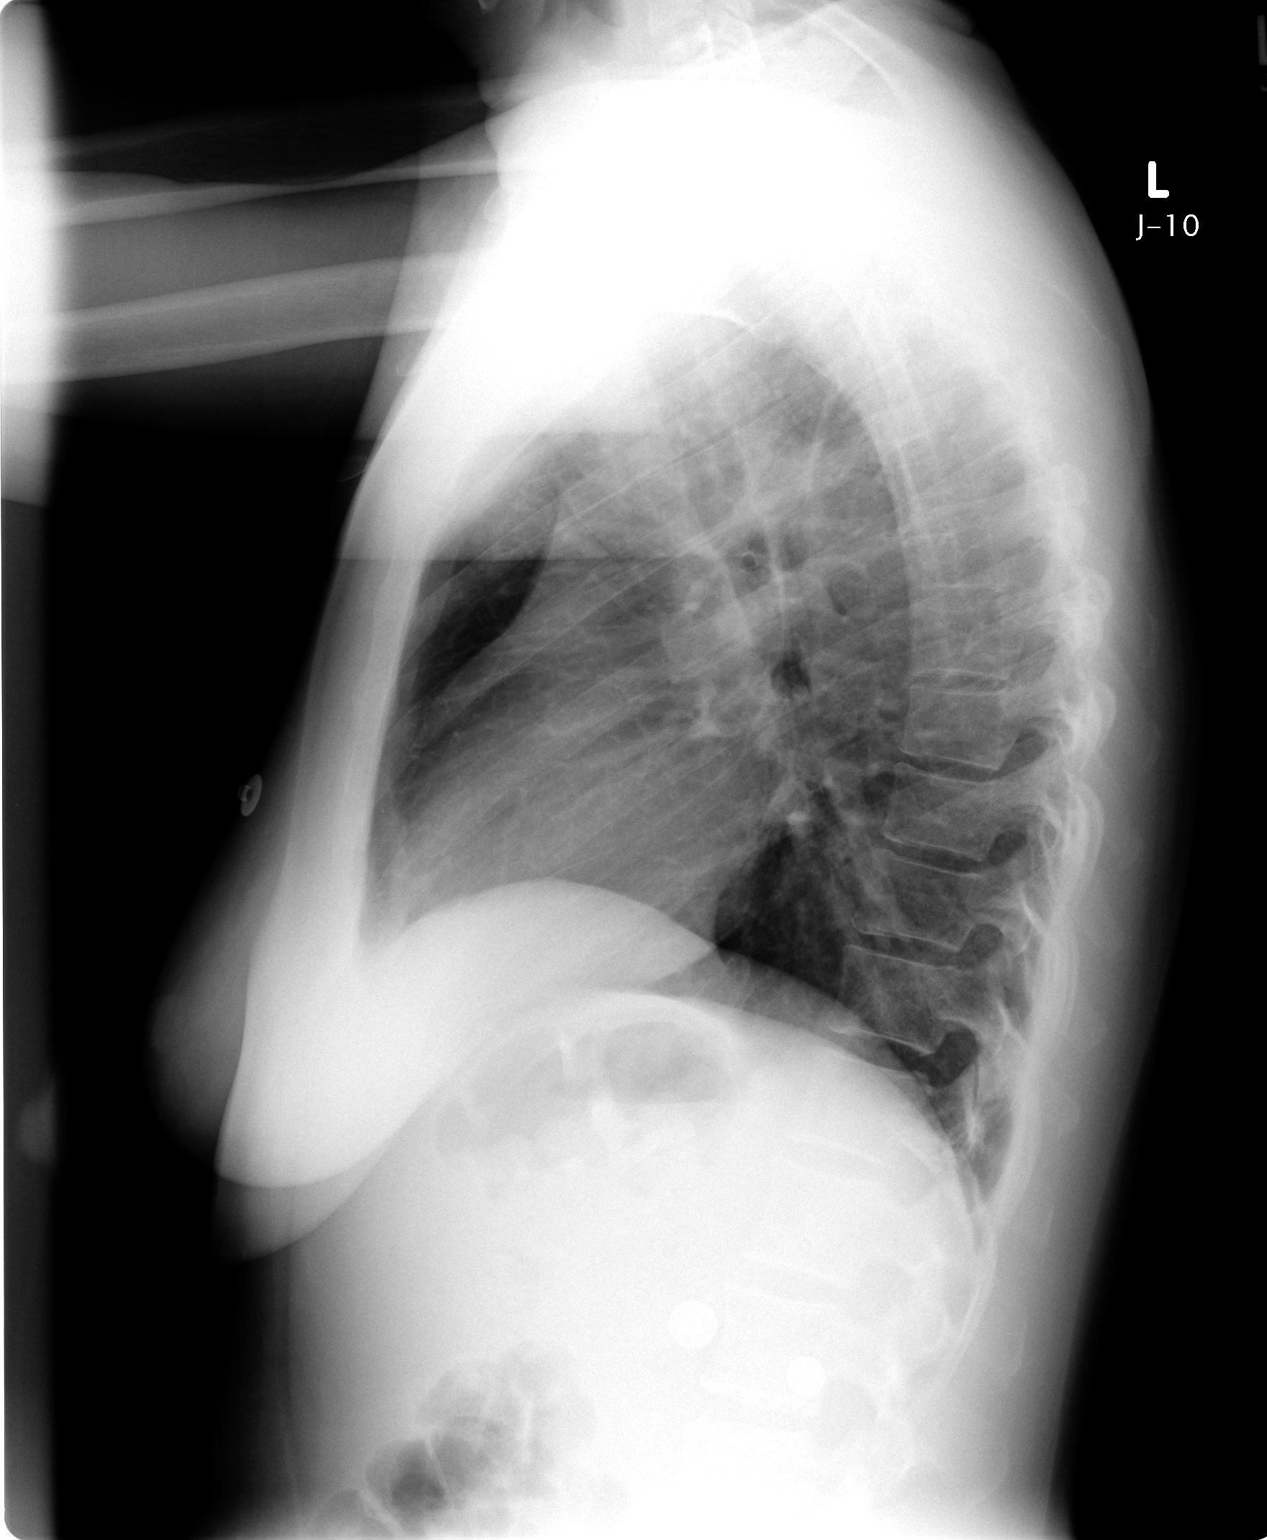

[2 of 2 positions shown; findings below may reference images not displayed]

FINDINGS: Normal cardiomediastinal silhouette.  Mild interstitial prominence is stable.  The lungs are otherwise clear and unchanged.  The bony thorax and upper abdomen are unremarkable.
IMPRESSION: 1.  No evidence of acute cardiopulmonary disease.
 2.  Stable mild interstitial prominence.

## 2004-10-12 ENCOUNTER — Ambulatory Visit: Payer: Self-pay | Admitting: Infectious Diseases

## 2004-11-09 ENCOUNTER — Ambulatory Visit: Payer: Self-pay | Admitting: Infectious Diseases

## 2004-11-09 ENCOUNTER — Ambulatory Visit (HOSPITAL_COMMUNITY): Admission: RE | Admit: 2004-11-09 | Discharge: 2004-11-09 | Payer: Self-pay | Admitting: Infectious Diseases

## 2004-11-30 ENCOUNTER — Ambulatory Visit: Payer: Self-pay | Admitting: Infectious Diseases

## 2005-02-24 ENCOUNTER — Ambulatory Visit (HOSPITAL_COMMUNITY): Admission: RE | Admit: 2005-02-24 | Discharge: 2005-02-24 | Payer: Self-pay | Admitting: Infectious Diseases

## 2005-02-24 ENCOUNTER — Ambulatory Visit: Payer: Self-pay | Admitting: Infectious Diseases

## 2005-03-03 IMAGING — CR DG CHEST 1V PORT
1 series · 1 of 1 positions shown · non-contrast
Comparison: none

CLINICAL DATA: 37-year-old, staph infection.  Fever.
 PORTABLE CHEST 
 A single portable view of the chest is compared to previous study from [DATE].  The cardiac silhouette, mediastinal and hilar contours are within normal limits and stable.  No acute pulmonary findings.
 IMPRESSION
 No acute cardiopulmonary findings.

[view not recorded]
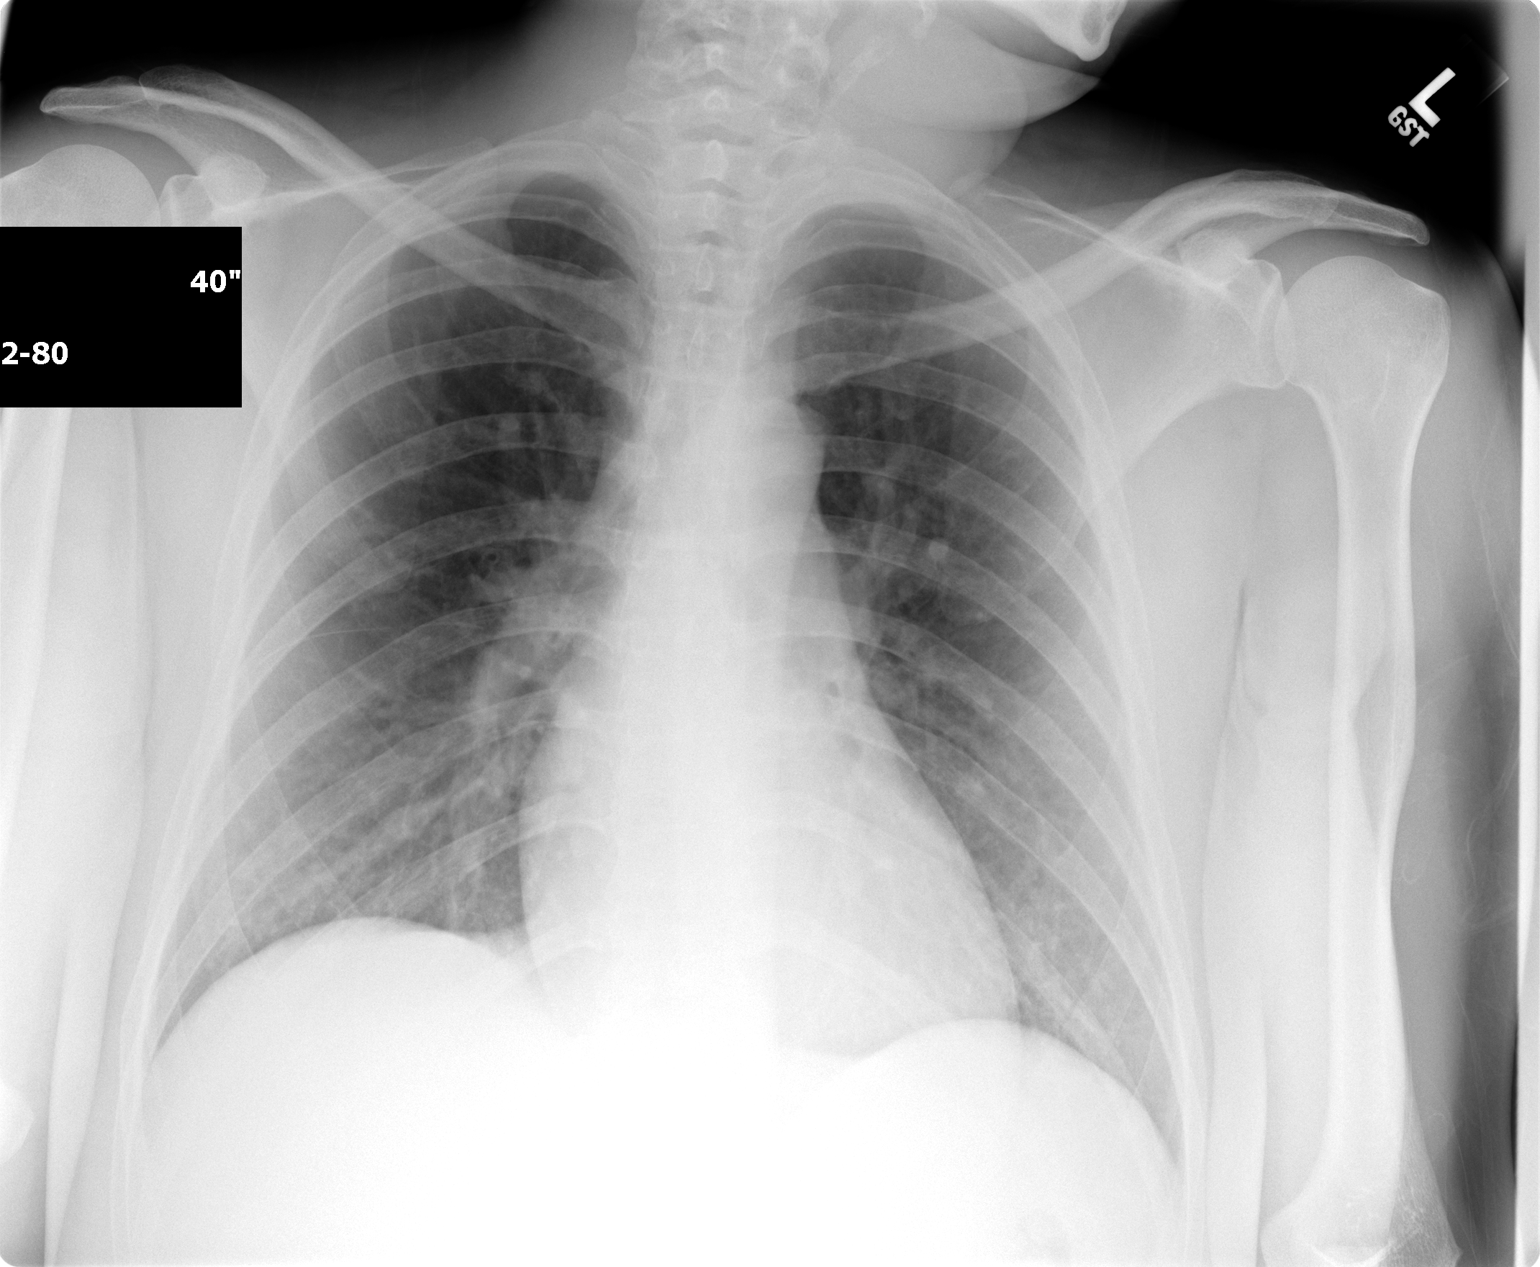

[1 of 1 positions shown; findings below may reference images not displayed]

## 2005-03-10 ENCOUNTER — Ambulatory Visit: Payer: Self-pay | Admitting: Infectious Diseases

## 2005-05-17 ENCOUNTER — Ambulatory Visit (HOSPITAL_COMMUNITY): Admission: RE | Admit: 2005-05-17 | Discharge: 2005-05-17 | Payer: Self-pay | Admitting: Infectious Diseases

## 2005-05-17 ENCOUNTER — Ambulatory Visit: Payer: Self-pay | Admitting: Infectious Diseases

## 2005-05-17 ENCOUNTER — Encounter (INDEPENDENT_AMBULATORY_CARE_PROVIDER_SITE_OTHER): Payer: Self-pay | Admitting: *Deleted

## 2005-05-17 LAB — CONVERTED CEMR LAB: CD4 Count: 330 microliters

## 2005-07-14 ENCOUNTER — Ambulatory Visit: Payer: Self-pay | Admitting: Infectious Diseases

## 2005-07-23 ENCOUNTER — Ambulatory Visit (HOSPITAL_COMMUNITY): Admission: RE | Admit: 2005-07-23 | Discharge: 2005-07-23 | Payer: Self-pay | Admitting: *Deleted

## 2005-09-28 ENCOUNTER — Encounter (INDEPENDENT_AMBULATORY_CARE_PROVIDER_SITE_OTHER): Payer: Self-pay | Admitting: *Deleted

## 2005-09-28 ENCOUNTER — Ambulatory Visit: Payer: Self-pay | Admitting: Infectious Diseases

## 2005-09-28 ENCOUNTER — Ambulatory Visit (HOSPITAL_COMMUNITY): Admission: RE | Admit: 2005-09-28 | Discharge: 2005-09-28 | Payer: Self-pay | Admitting: Infectious Diseases

## 2005-09-28 LAB — CONVERTED CEMR LAB
CD4 Count: 420 microliters
HIV 1 RNA Quant: 49 copies/mL

## 2006-04-06 ENCOUNTER — Encounter: Admission: RE | Admit: 2006-04-06 | Discharge: 2006-04-06 | Payer: Self-pay | Admitting: Infectious Diseases

## 2006-04-06 ENCOUNTER — Ambulatory Visit: Payer: Self-pay | Admitting: Infectious Diseases

## 2006-04-06 ENCOUNTER — Encounter (INDEPENDENT_AMBULATORY_CARE_PROVIDER_SITE_OTHER): Payer: Self-pay | Admitting: *Deleted

## 2006-04-06 LAB — CONVERTED CEMR LAB
CD4 Count: 340 microliters
HIV 1 RNA Quant: 8590 copies/mL

## 2006-05-30 ENCOUNTER — Ambulatory Visit: Payer: Self-pay | Admitting: Infectious Diseases

## 2006-05-30 ENCOUNTER — Encounter (INDEPENDENT_AMBULATORY_CARE_PROVIDER_SITE_OTHER): Payer: Self-pay | Admitting: *Deleted

## 2006-05-30 LAB — CONVERTED CEMR LAB: HIV 1 RNA Quant: 11000 copies/mL

## 2006-07-27 ENCOUNTER — Encounter (INDEPENDENT_AMBULATORY_CARE_PROVIDER_SITE_OTHER): Payer: Self-pay | Admitting: Infectious Diseases

## 2006-12-26 ENCOUNTER — Encounter (INDEPENDENT_AMBULATORY_CARE_PROVIDER_SITE_OTHER): Payer: Self-pay | Admitting: *Deleted

## 2006-12-26 LAB — CONVERTED CEMR LAB

## 2007-01-08 ENCOUNTER — Encounter (INDEPENDENT_AMBULATORY_CARE_PROVIDER_SITE_OTHER): Payer: Self-pay | Admitting: *Deleted

## 2007-01-30 ENCOUNTER — Encounter: Admission: RE | Admit: 2007-01-30 | Discharge: 2007-01-30 | Payer: Self-pay | Admitting: Infectious Diseases

## 2007-01-30 ENCOUNTER — Ambulatory Visit: Payer: Self-pay | Admitting: Infectious Diseases

## 2007-01-30 DIAGNOSIS — B009 Herpesviral infection, unspecified: Secondary | ICD-10-CM | POA: Insufficient documentation

## 2007-01-30 DIAGNOSIS — B20 Human immunodeficiency virus [HIV] disease: Secondary | ICD-10-CM | POA: Insufficient documentation

## 2007-02-13 ENCOUNTER — Telehealth (INDEPENDENT_AMBULATORY_CARE_PROVIDER_SITE_OTHER): Payer: Self-pay | Admitting: Infectious Diseases

## 2007-04-03 ENCOUNTER — Ambulatory Visit: Payer: Self-pay | Admitting: Infectious Diseases

## 2007-04-03 LAB — CONVERTED CEMR LAB

## 2007-04-18 ENCOUNTER — Telehealth (INDEPENDENT_AMBULATORY_CARE_PROVIDER_SITE_OTHER): Payer: Self-pay | Admitting: Infectious Diseases

## 2007-07-04 ENCOUNTER — Telehealth: Payer: Self-pay | Admitting: Internal Medicine

## 2008-02-29 ENCOUNTER — Encounter: Payer: Self-pay | Admitting: Infectious Disease

## 2008-04-22 ENCOUNTER — Encounter: Admission: RE | Admit: 2008-04-22 | Discharge: 2008-04-22 | Payer: Self-pay | Admitting: Infectious Disease

## 2008-04-22 ENCOUNTER — Ambulatory Visit: Payer: Self-pay | Admitting: Infectious Disease

## 2008-04-22 DIAGNOSIS — B957 Other staphylococcus as the cause of diseases classified elsewhere: Secondary | ICD-10-CM | POA: Insufficient documentation

## 2008-04-22 DIAGNOSIS — R21 Rash and other nonspecific skin eruption: Secondary | ICD-10-CM | POA: Insufficient documentation

## 2008-04-22 DIAGNOSIS — N951 Menopausal and female climacteric states: Secondary | ICD-10-CM | POA: Insufficient documentation

## 2008-04-22 DIAGNOSIS — R61 Generalized hyperhidrosis: Secondary | ICD-10-CM | POA: Insufficient documentation

## 2008-04-22 LAB — CONVERTED CEMR LAB
ALT: 18 units/L (ref 0–35)
AST: 16 units/L (ref 0–37)
Basophils Absolute: 0 10*3/uL (ref 0.0–0.1)
Basophils Relative: 0 % (ref 0–1)
Bilirubin Urine: NEGATIVE
Chlamydia, Swab/Urine, PCR: NEGATIVE
Chloride: 105 meq/L (ref 96–112)
Creatinine, Ser: 0.87 mg/dL (ref 0.40–1.20)
Eosinophils Relative: 6 % — ABNORMAL HIGH (ref 0–5)
GC Probe Amp, Urine: NEGATIVE
HIV 1 RNA Quant: 23200 copies/mL — ABNORMAL HIGH (ref <50)
HIV-1 RNA Quant, Log: 4.37 — ABNORMAL HIGH (ref <1.70)
Hemoglobin: 12.9 g/dL (ref 12.0–15.0)
Lymphocytes Relative: 38 % (ref 12–46)
MCHC: 32.6 g/dL (ref 30.0–36.0)
Monocytes Absolute: 0.6 10*3/uL (ref 0.1–1.0)
Neutro Abs: 3.2 10*3/uL (ref 1.7–7.7)
Platelets: 373 10*3/uL (ref 150–400)
RDW: 13.8 % (ref 11.5–15.5)
Sodium: 141 meq/L (ref 135–145)
Total Bilirubin: 0.3 mg/dL (ref 0.3–1.2)
Total Protein: 7.5 g/dL (ref 6.0–8.3)
Urine Glucose: NEGATIVE mg/dL
pH: 6 (ref 5.0–8.0)

## 2008-05-08 ENCOUNTER — Telehealth: Payer: Self-pay | Admitting: Infectious Disease

## 2008-05-16 ENCOUNTER — Encounter: Payer: Self-pay | Admitting: Infectious Disease

## 2008-05-16 ENCOUNTER — Ambulatory Visit: Payer: Self-pay | Admitting: Infectious Disease

## 2008-05-16 LAB — CONVERTED CEMR LAB: Pap Smear: NORMAL

## 2008-06-03 ENCOUNTER — Ambulatory Visit (HOSPITAL_COMMUNITY): Admission: RE | Admit: 2008-06-03 | Discharge: 2008-06-03 | Payer: Self-pay | Admitting: Infectious Disease

## 2008-06-03 ENCOUNTER — Encounter: Admission: RE | Admit: 2008-06-03 | Discharge: 2008-06-03 | Payer: Self-pay | Admitting: Infectious Disease

## 2008-06-03 ENCOUNTER — Ambulatory Visit: Payer: Self-pay | Admitting: Infectious Disease

## 2008-06-03 DIAGNOSIS — K644 Residual hemorrhoidal skin tags: Secondary | ICD-10-CM | POA: Insufficient documentation

## 2008-06-03 DIAGNOSIS — L293 Anogenital pruritus, unspecified: Secondary | ICD-10-CM | POA: Insufficient documentation

## 2008-06-03 DIAGNOSIS — A599 Trichomoniasis, unspecified: Secondary | ICD-10-CM | POA: Insufficient documentation

## 2008-06-03 LAB — CONVERTED CEMR LAB
AST: 21 units/L (ref 0–37)
Albumin: 4.2 g/dL (ref 3.5–5.2)
Alkaline Phosphatase: 86 units/L (ref 39–117)
BUN: 10 mg/dL (ref 6–23)
Basophils Absolute: 0.1 10*3/uL (ref 0.0–0.1)
Basophils Relative: 1 % (ref 0–1)
Eosinophils Absolute: 0.5 10*3/uL (ref 0.0–0.7)
MCHC: 32.1 g/dL (ref 30.0–36.0)
MCV: 85.9 fL (ref 78.0–100.0)
Monocytes Relative: 8 % (ref 3–12)
Neutrophils Relative %: 42 % — ABNORMAL LOW (ref 43–77)
Platelets: 376 10*3/uL (ref 150–400)
Potassium: 4.3 meq/L (ref 3.5–5.3)
RDW: 14.5 % (ref 11.5–15.5)
Sodium: 138 meq/L (ref 135–145)

## 2008-06-03 IMAGING — MG MM DIGITAL SCREENING BILAT
4 series · 4 of 4 positions shown · non-contrast
Comparison: Prior studies.

DG SCREEN MAMMOGRAM BILATERAL
Bilateral CC and MLO view(s) were taken.
Prior study comparison: [DATE], bilateral screening mammogram.

DIGITAL SCREENING MAMMOGRAM WITH CAD:

[R CC]
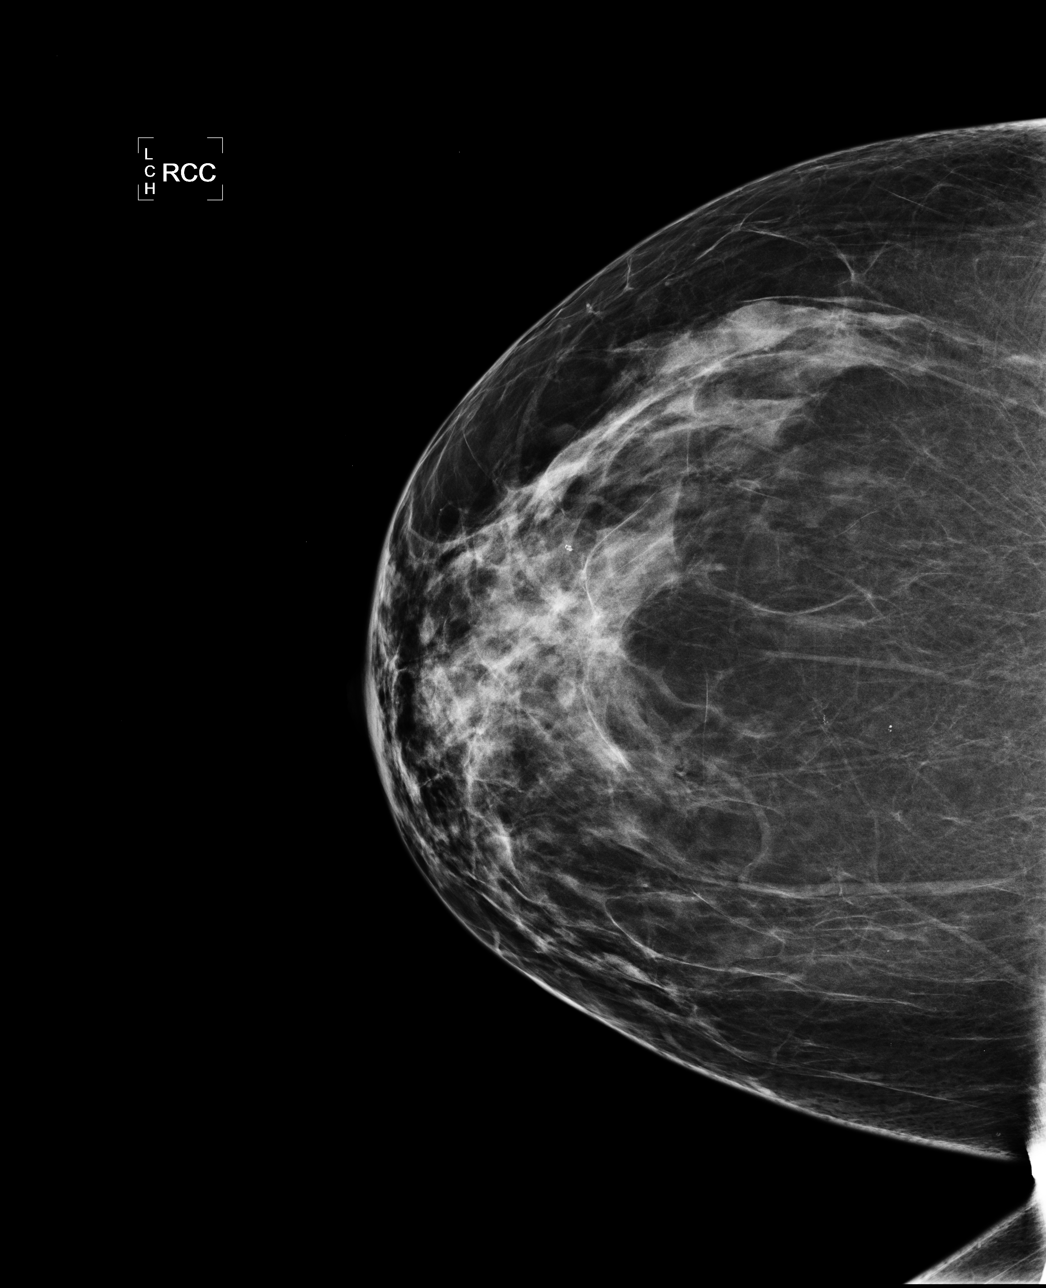

[R MLO]
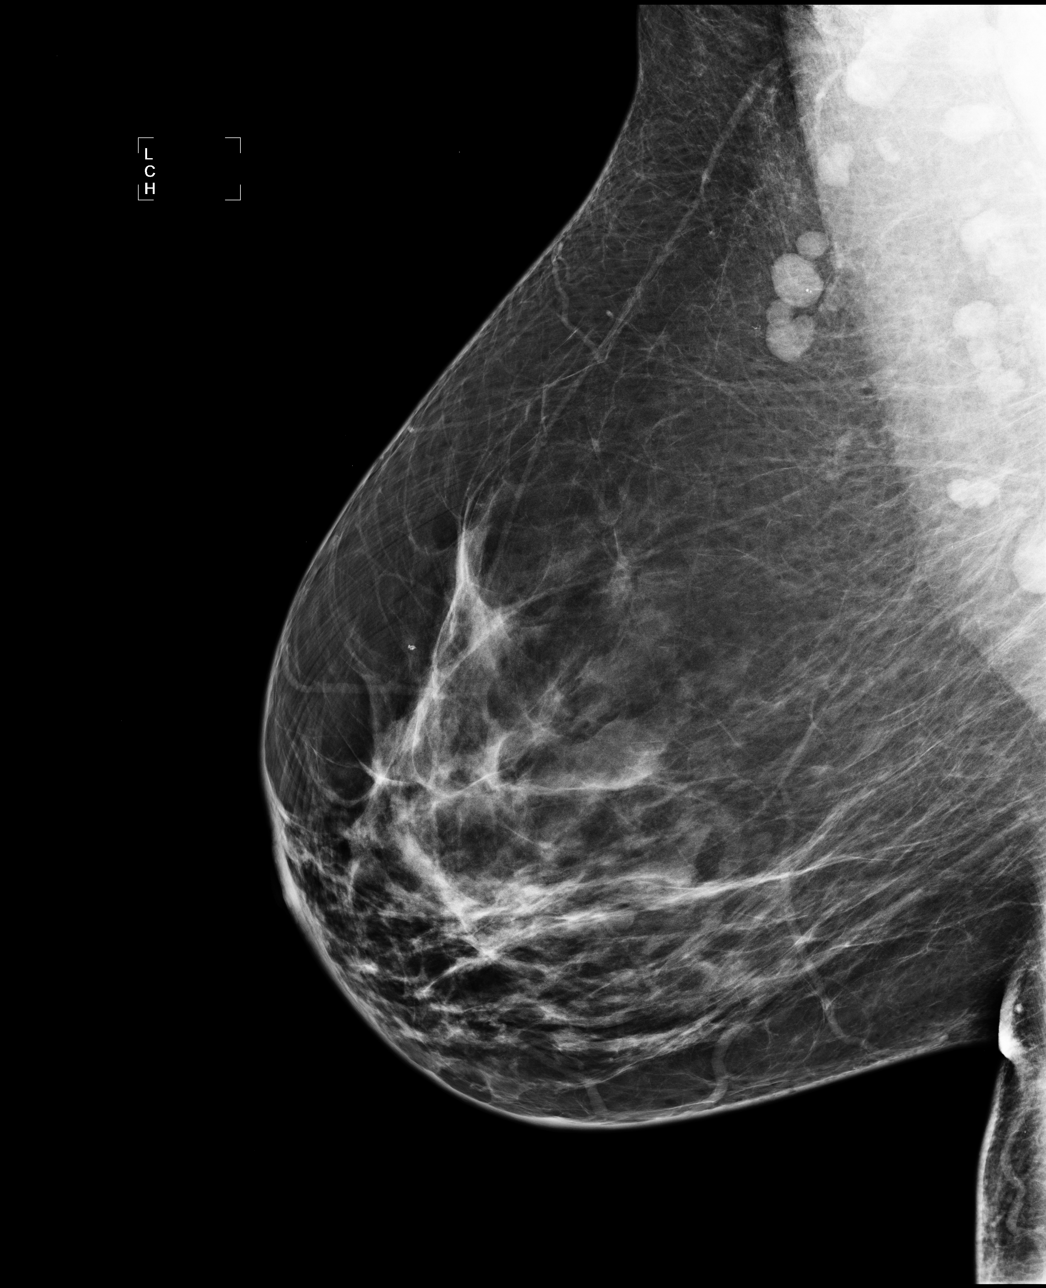

[L CC]
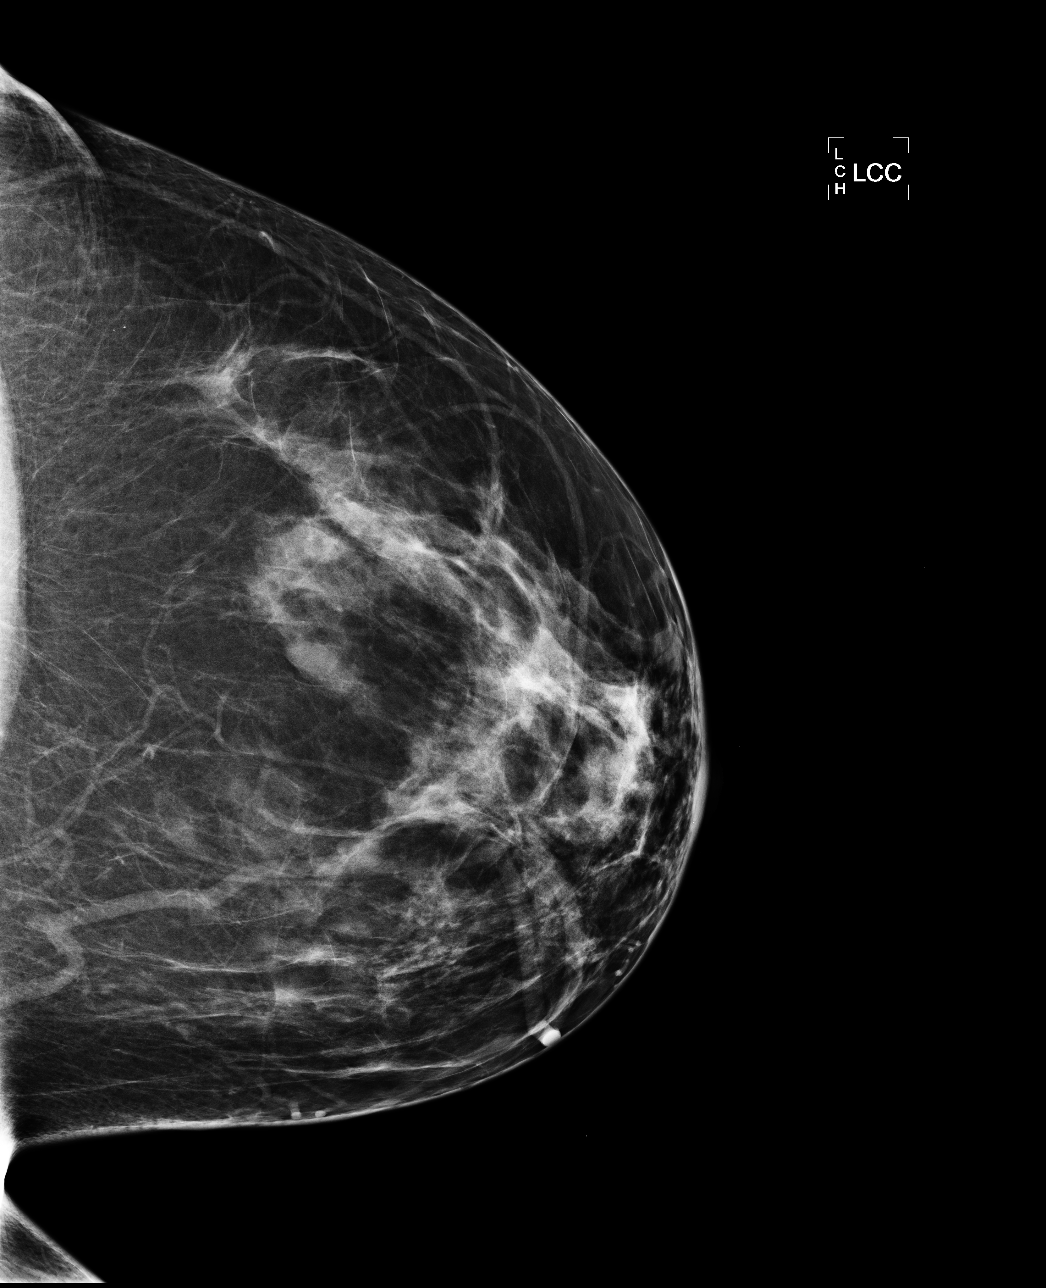

[L MLO]
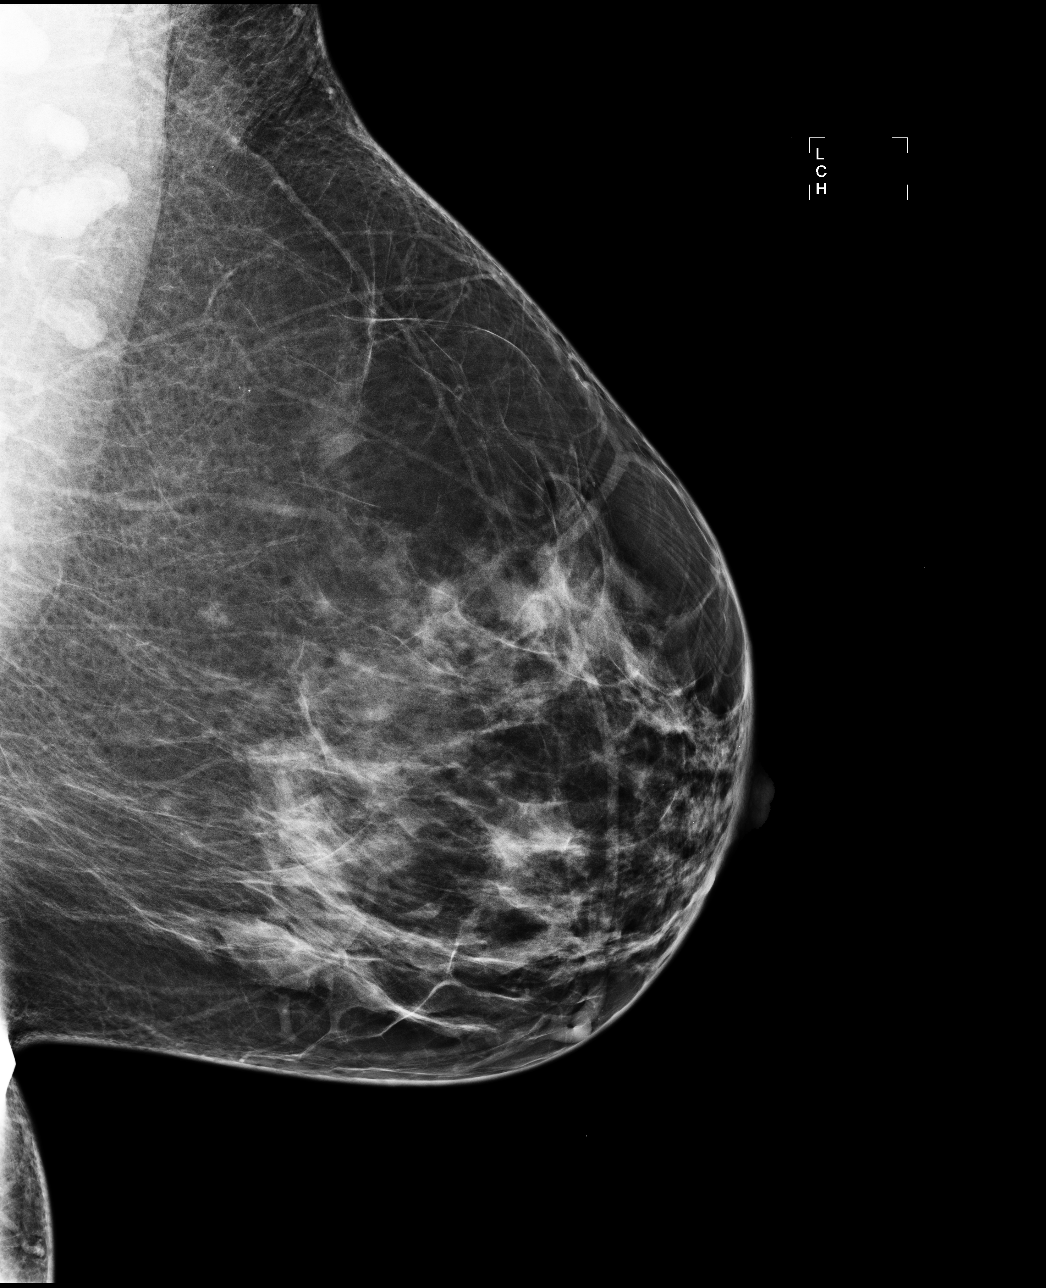

[4 of 4 positions shown; findings below may reference images not displayed]

The breast tissue is heterogeneously dense.  There is no dominant mass, architectural distortion or
calcification to suggest malignancy.
IMPRESSION: No mammographic evidence of malignancy.  Suggest yearly screening mammography.

ASSESSMENT: Negative - BI-RADS 1

Screening mammogram in 1 year.
ANALYZED BY COMPUTER AIDED DETECTION. , THIS PROCEDURE WAS A DIGITAL MAMMOGRAM.

## 2008-06-04 ENCOUNTER — Encounter: Payer: Self-pay | Admitting: Infectious Disease

## 2008-08-14 ENCOUNTER — Telehealth: Payer: Self-pay

## 2008-11-28 ENCOUNTER — Telehealth: Payer: Self-pay | Admitting: Infectious Disease

## 2008-12-02 ENCOUNTER — Telehealth: Payer: Self-pay

## 2008-12-04 ENCOUNTER — Ambulatory Visit: Payer: Self-pay | Admitting: Infectious Disease

## 2008-12-04 LAB — CONVERTED CEMR LAB
ALT: 26 units/L (ref 0–35)
AST: 18 units/L (ref 0–37)
Albumin: 4.1 g/dL (ref 3.5–5.2)
Alkaline Phosphatase: 77 units/L (ref 39–117)
BUN: 9 mg/dL (ref 6–23)
Basophils Absolute: 0 10*3/uL (ref 0.0–0.1)
Basophils Relative: 1 % (ref 0–1)
CO2: 21 meq/L (ref 19–32)
Calcium: 8.9 mg/dL (ref 8.4–10.5)
Chloride: 105 meq/L (ref 96–112)
Creatinine, Ser: 0.87 mg/dL (ref 0.40–1.20)
Eosinophils Absolute: 0.6 10*3/uL (ref 0.0–0.7)
Eosinophils Relative: 10 % — ABNORMAL HIGH (ref 0–5)
Glucose, Bld: 86 mg/dL (ref 70–99)
HCT: 40.3 % (ref 36.0–46.0)
Hemoglobin: 12.9 g/dL (ref 12.0–15.0)
Lymphocytes Relative: 37 % (ref 12–46)
Lymphs Abs: 2.2 10*3/uL (ref 0.7–4.0)
MCHC: 32 g/dL (ref 30.0–36.0)
MCV: 86.1 fL (ref 78.0–100.0)
Monocytes Absolute: 0.7 10*3/uL (ref 0.1–1.0)
Monocytes Relative: 13 % — ABNORMAL HIGH (ref 3–12)
Neutro Abs: 2.4 10*3/uL (ref 1.7–7.7)
Neutrophils Relative %: 40 % — ABNORMAL LOW (ref 43–77)
Platelets: 330 10*3/uL (ref 150–400)
Potassium: 4.3 meq/L (ref 3.5–5.3)
RBC: 4.68 M/uL (ref 3.87–5.11)
RDW: 13.1 % (ref 11.5–15.5)
Sodium: 140 meq/L (ref 135–145)
Total Bilirubin: 0.3 mg/dL (ref 0.3–1.2)
Total Protein: 7.1 g/dL (ref 6.0–8.3)
WBC: 5.9 10*3/uL (ref 4.0–10.5)

## 2008-12-11 ENCOUNTER — Encounter: Payer: Self-pay | Admitting: Infectious Disease

## 2008-12-11 LAB — CONVERTED CEMR LAB: HIV-1 RNA Quant, Log: 4.85 — ABNORMAL HIGH (ref <1.68)

## 2008-12-23 ENCOUNTER — Ambulatory Visit: Payer: Self-pay | Admitting: Infectious Disease

## 2008-12-23 ENCOUNTER — Other Ambulatory Visit: Admission: RE | Admit: 2008-12-23 | Discharge: 2008-12-23 | Payer: Self-pay | Admitting: Infectious Disease

## 2008-12-23 ENCOUNTER — Encounter: Payer: Self-pay | Admitting: Infectious Disease

## 2009-01-14 ENCOUNTER — Encounter: Payer: Self-pay | Admitting: Infectious Disease

## 2009-01-14 LAB — CONVERTED CEMR LAB
Candida species: NEGATIVE
GC Probe Amp, Genital: NEGATIVE

## 2009-01-15 ENCOUNTER — Encounter (INDEPENDENT_AMBULATORY_CARE_PROVIDER_SITE_OTHER): Payer: Self-pay | Admitting: Licensed Clinical Social Worker

## 2009-08-29 ENCOUNTER — Ambulatory Visit: Payer: Self-pay | Admitting: Infectious Disease

## 2009-08-29 LAB — CONVERTED CEMR LAB
ALT: 29 units/L (ref 0–35)
BUN: 10 mg/dL (ref 6–23)
Basophils Relative: 1 % (ref 0–1)
CO2: 24 meq/L (ref 19–32)
Creatinine, Ser: 1.14 mg/dL (ref 0.40–1.20)
Eosinophils Absolute: 1.4 10*3/uL — ABNORMAL HIGH (ref 0.0–0.7)
Eosinophils Relative: 15 % — ABNORMAL HIGH (ref 0–5)
HIV-1 RNA Quant, Log: 5.24 — ABNORMAL HIGH (ref <1.68)
MCHC: 31.6 g/dL (ref 30.0–36.0)
MCV: 86.1 fL (ref >=78.0)
Monocytes Relative: 8 % (ref 3–12)
Neutrophils Relative %: 52 % (ref 43–77)
Platelets: 492 10*3/uL — ABNORMAL HIGH (ref 150–400)
Total Bilirubin: 0.2 mg/dL — ABNORMAL LOW (ref 0.3–1.2)

## 2009-09-18 ENCOUNTER — Ambulatory Visit: Payer: Self-pay | Admitting: Infectious Disease

## 2009-09-18 DIAGNOSIS — F0630 Mood disorder due to known physiological condition, unspecified: Secondary | ICD-10-CM | POA: Insufficient documentation

## 2009-09-18 DIAGNOSIS — L538 Other specified erythematous conditions: Secondary | ICD-10-CM | POA: Insufficient documentation

## 2009-09-19 ENCOUNTER — Encounter: Payer: Self-pay | Admitting: Infectious Disease

## 2009-09-23 ENCOUNTER — Ambulatory Visit: Payer: Self-pay | Admitting: Infectious Disease

## 2009-09-23 ENCOUNTER — Other Ambulatory Visit: Admission: RE | Admit: 2009-09-23 | Discharge: 2009-09-23 | Payer: Self-pay | Admitting: Infectious Disease

## 2009-09-23 ENCOUNTER — Ambulatory Visit: Payer: Self-pay | Admitting: Internal Medicine

## 2009-09-23 ENCOUNTER — Encounter: Payer: Self-pay | Admitting: Internal Medicine

## 2009-09-23 ENCOUNTER — Inpatient Hospital Stay (HOSPITAL_COMMUNITY): Admission: AD | Admit: 2009-09-23 | Discharge: 2009-09-26 | Payer: Self-pay | Admitting: Infectious Disease

## 2009-09-23 DIAGNOSIS — K602 Anal fissure, unspecified: Secondary | ICD-10-CM | POA: Insufficient documentation

## 2009-09-23 DIAGNOSIS — B373 Candidiasis of vulva and vagina: Secondary | ICD-10-CM | POA: Insufficient documentation

## 2009-09-24 ENCOUNTER — Encounter: Payer: Self-pay | Admitting: Internal Medicine

## 2009-09-24 IMAGING — US US EXTREM LOW NON VASC*L*
1 series · 14 of 16 positions shown · non-contrast
Comparison: None

CLINICAL DATA: History given of previous abscess in the axillary
region.  History given of staphylococcal infection.  Evaluation of
axillary area for abscess.

LEFT LOWER EXTREMITY SOFT TISSUE ULTRASOUND
TECHNIQUE: Ultrasound examination of the soft tissues was
performed in the area of clinical concern.

[Series 1: us extrem low non vasc*left* · 0.10mm/px · 14 of 16 slices shown]
[im 1/16]
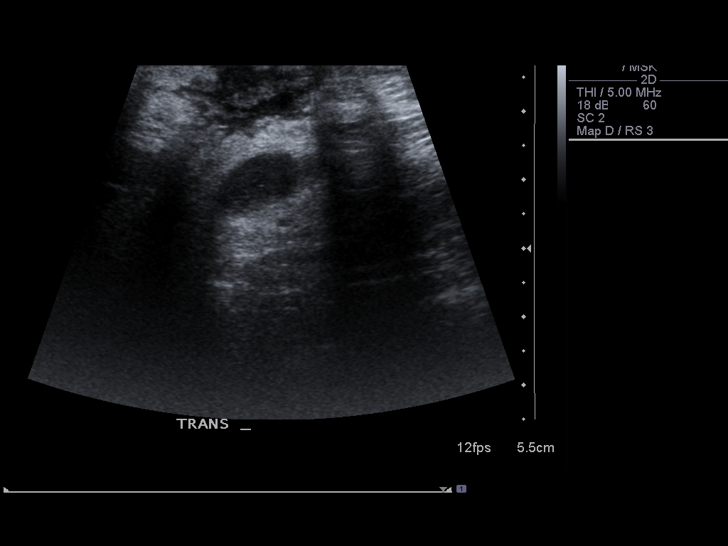
[im 2/16]
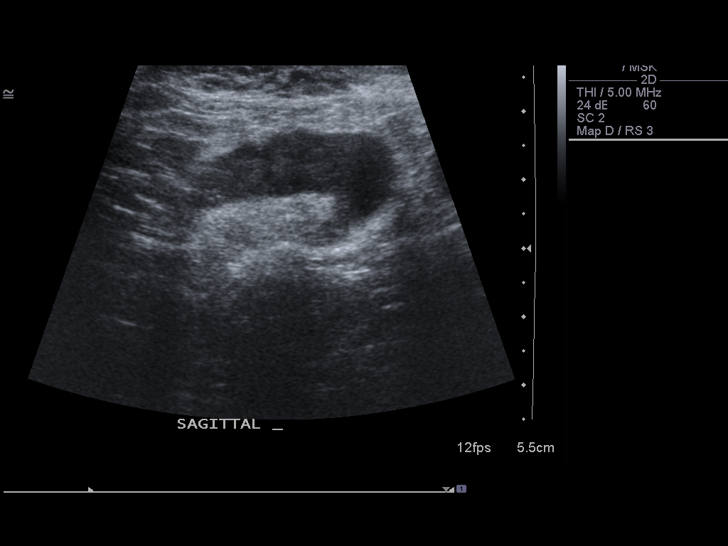
[im 3/16]
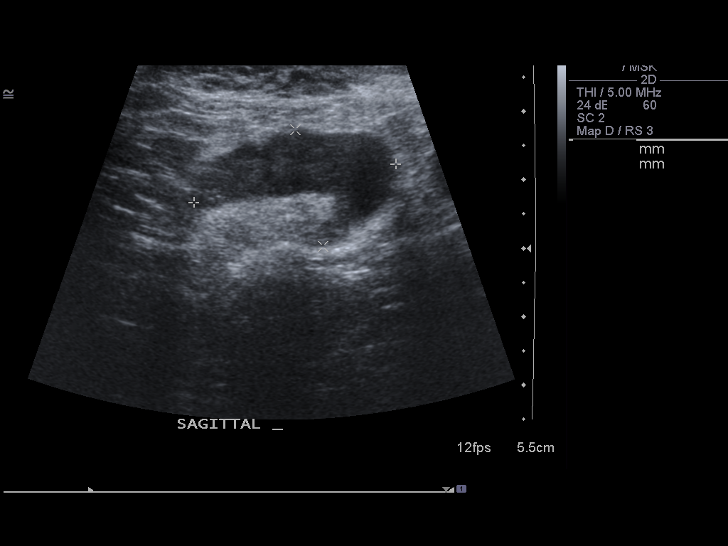
[im 5/16]
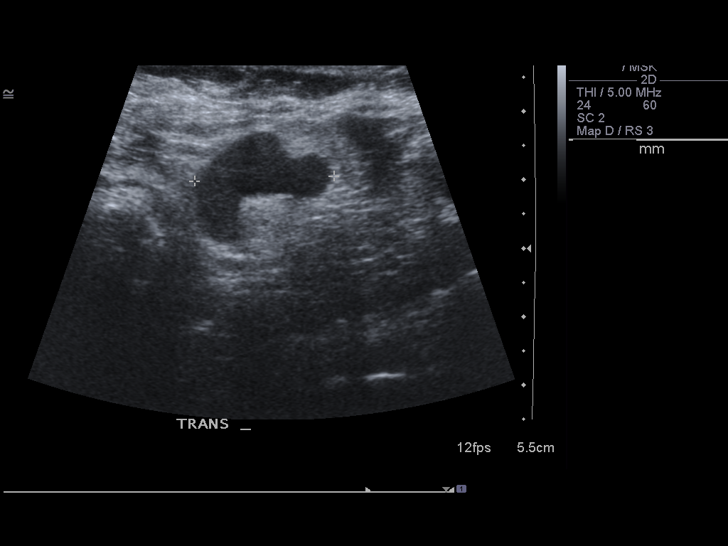
[im 6/16]
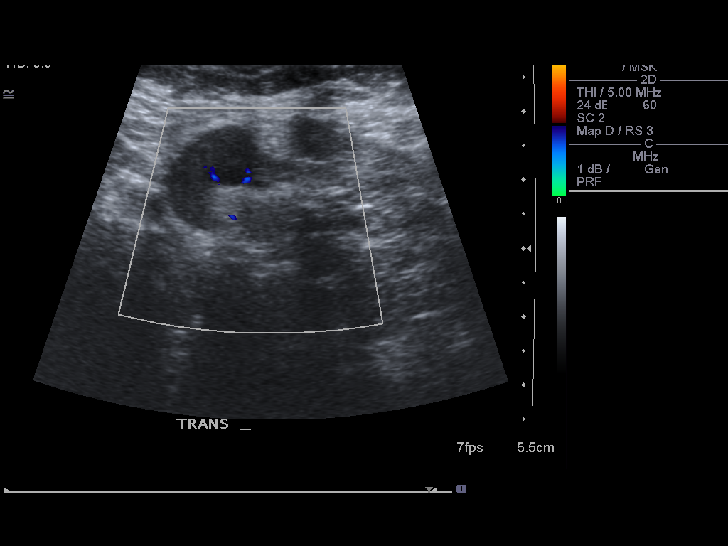
[im 7/16]
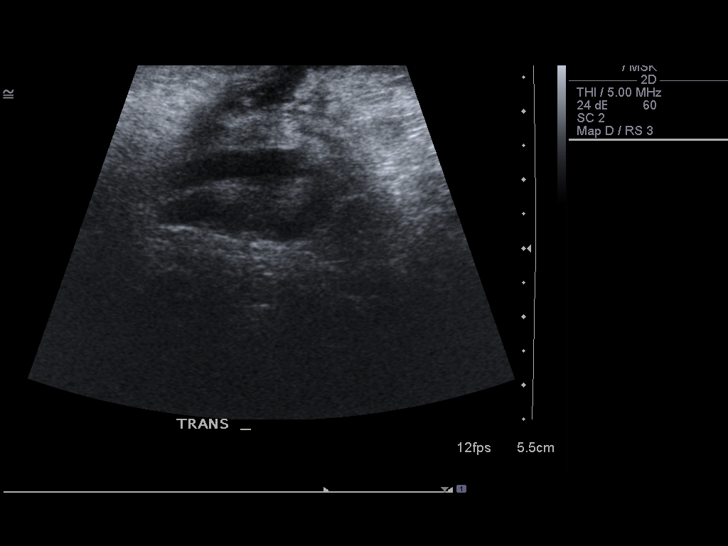
[im 8/16]
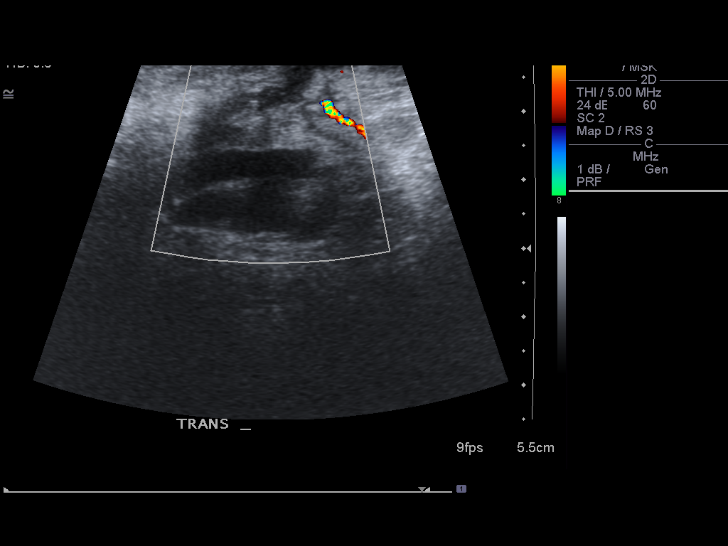
[im 9/16]
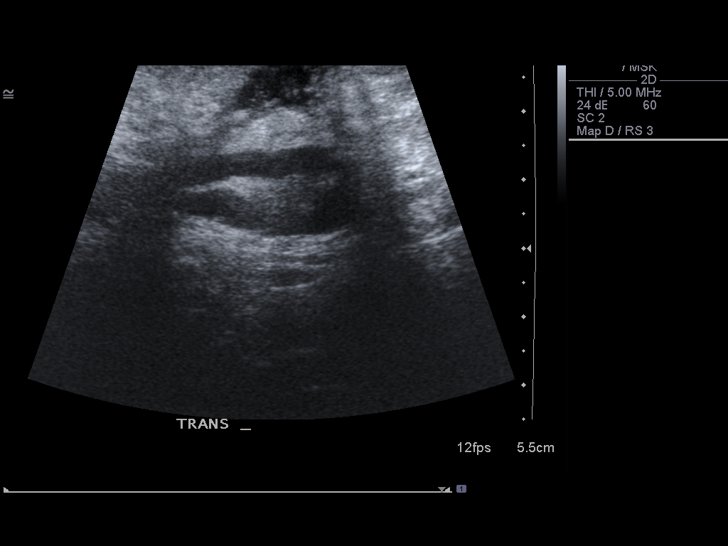
[im 10/16]
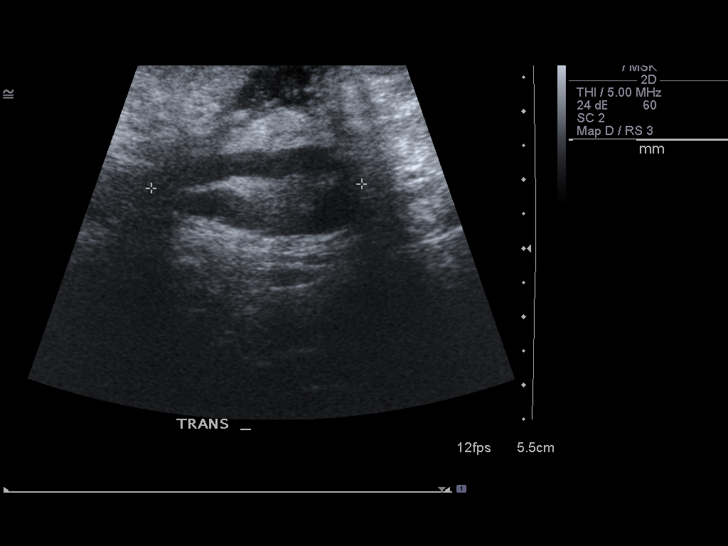
[im 11/16]
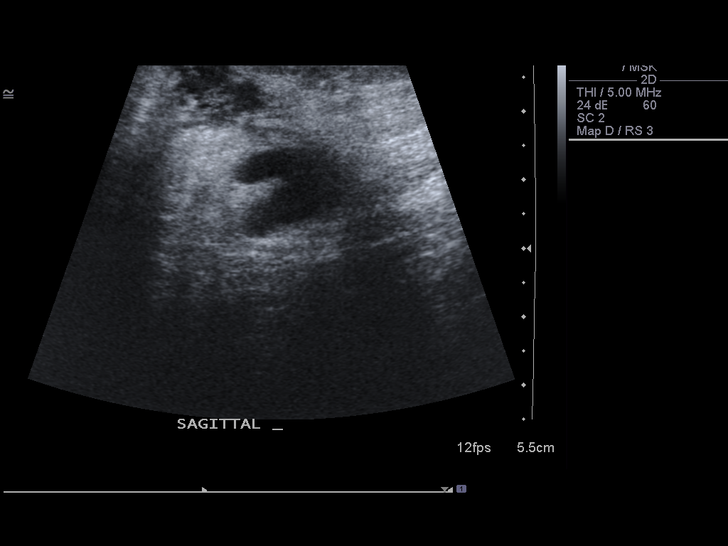
[im 13/16]
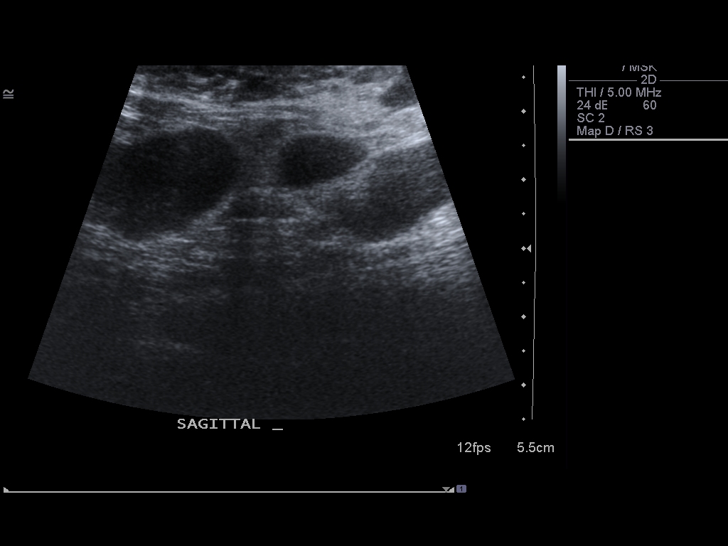
[im 14/16]
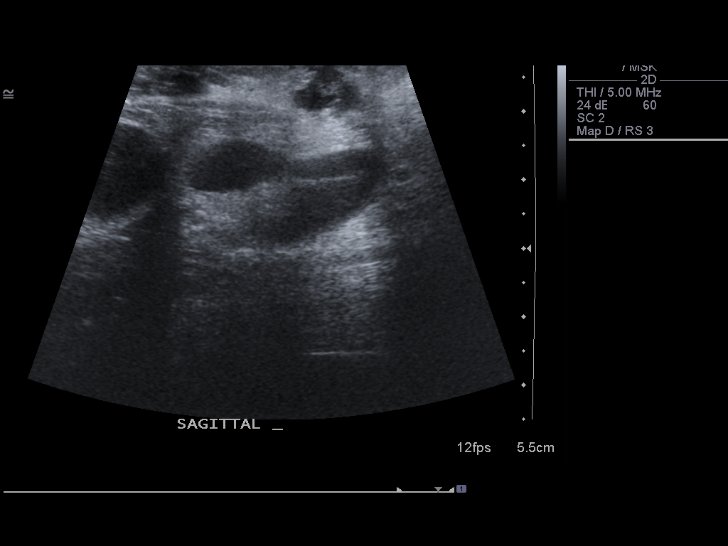
[im 15/16]
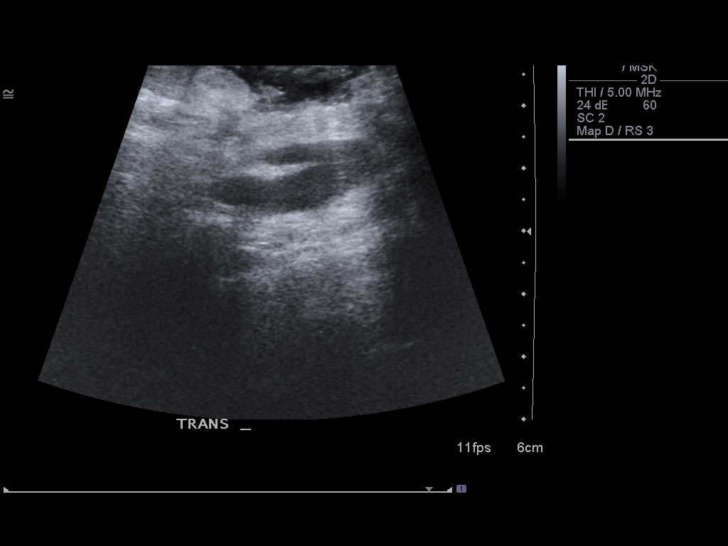
[im 16/16]
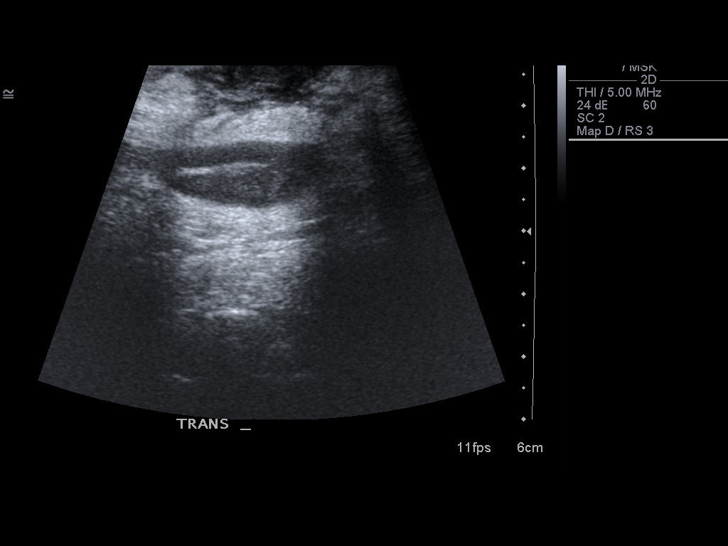

[14 of 16 positions shown; findings below may reference images not displayed]

FINDINGS: No discrete abscess could be identified and axillary
region.  Multiple lymph nodes are identified.  The largest measured
at 3.1 x 1.8 x 2.0 cm. There are several other smaller lymph nodes
present.
IMPRESSION: There are some prominent lymph nodes in the axillary region.  No
discrete abscess could be identified.

## 2009-09-25 IMAGING — CT CT CHEST W/ CM
3 series · 17 of 30 positions shown, 18 images · IV contrast (80 ml omni 300)
Comparison: Chest radiograph [DATE]

CLINICAL DATA: Staphylococcus infection.  Question abscess in the
left axilla.

CT CHEST WITH CONTRAST
TECHNIQUE: Multidetector CT imaging of the chest was performed
following the standard protocol during bolus administration of
intravenous contrast.
Contrast: 280 ml [V5]

[Series 2: routine chest · axial · 0.91mm/px · z∈[-55,+45]mm · 3 of 61 slices shown, 4 images]
[im 21/61  mediastinal]
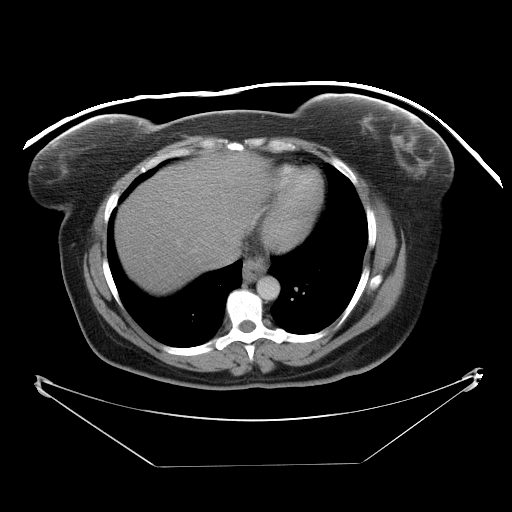
[im 21/61  lung]
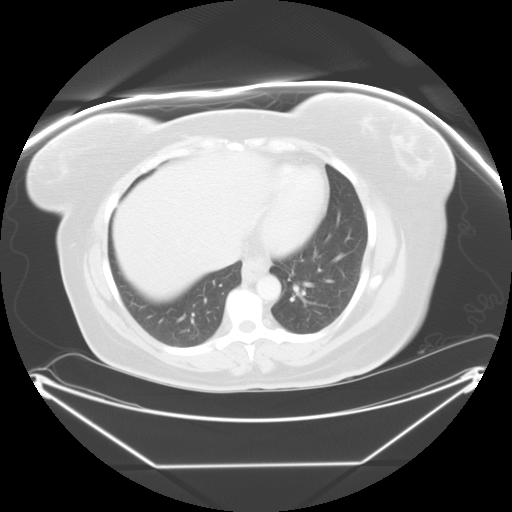
[im 38/61  lung]
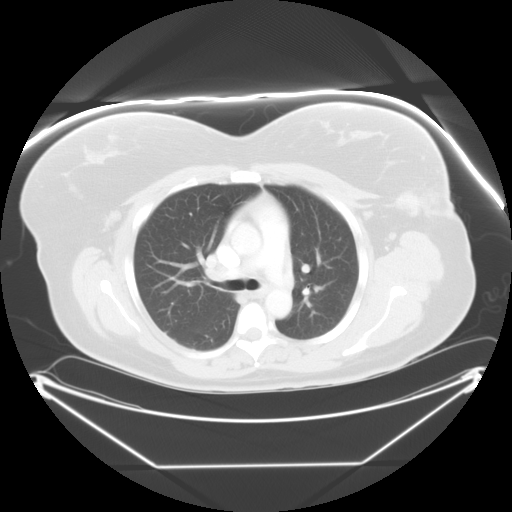
[im 41/61  lung]
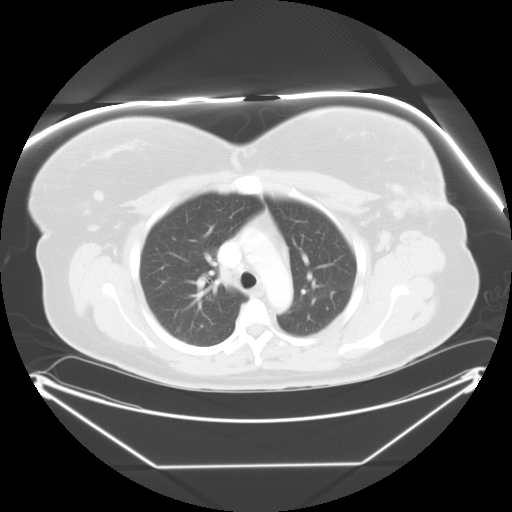

[Series 400: reformatted · sagittal · 0.91mm/px · 8 of 226 slices shown (1 of 2)]
[im 18/226  lung]
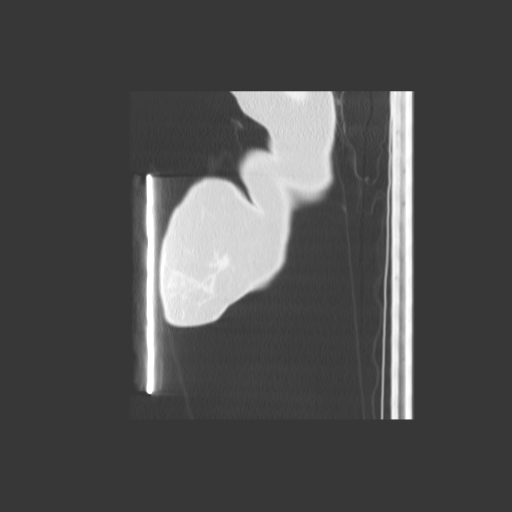
[im 52/226  lung]
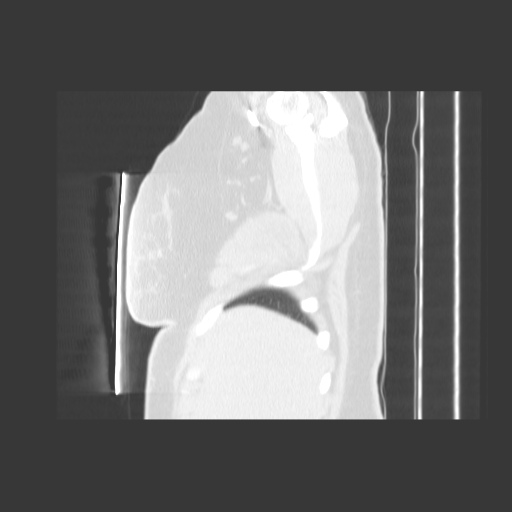
[im 87/226  lung]
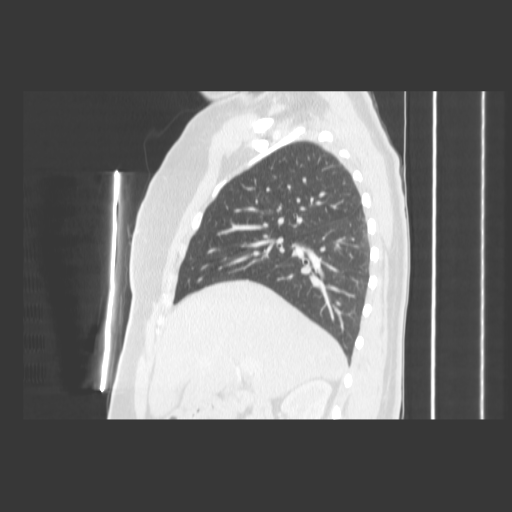
[im 104/226  lung]
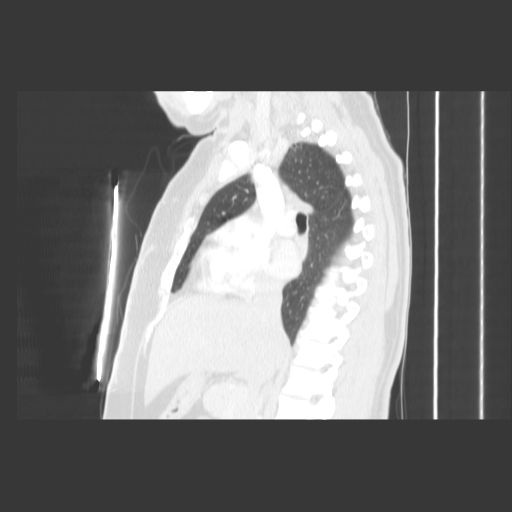
[im 122/226  lung]
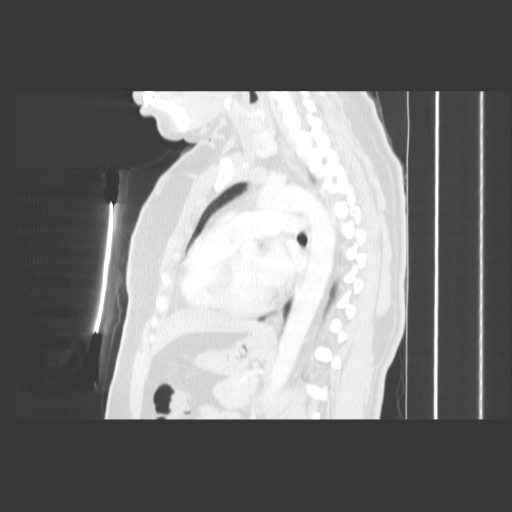
[im 139/226  lung]
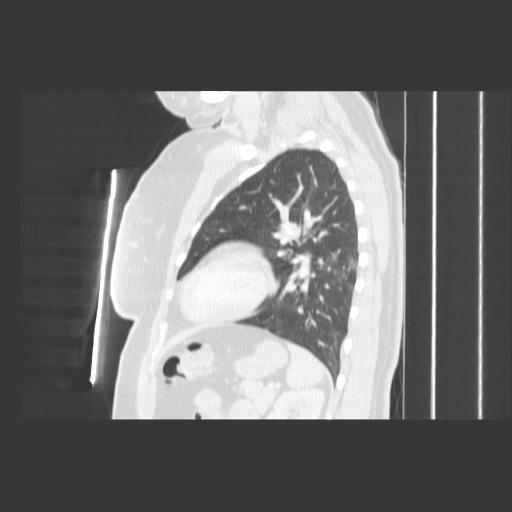
[im 174/226  lung]
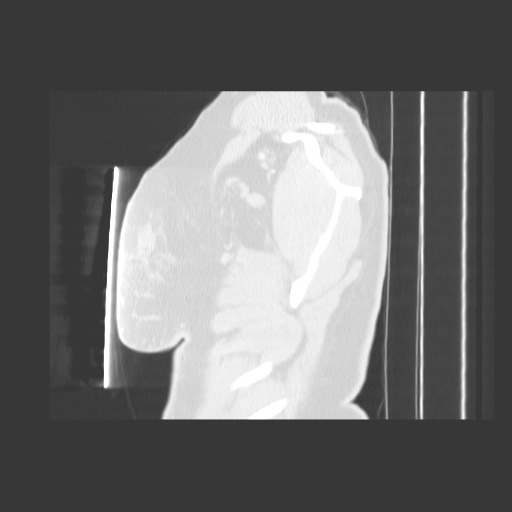
[im 208/226  lung]
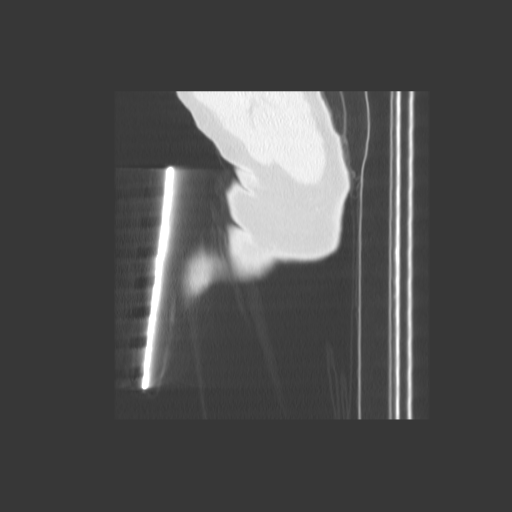

[Series 401: reformatted · coronal · 0.91mm/px · 6 of 163 slices shown (2 of 2)]
[im 19/163  lung]
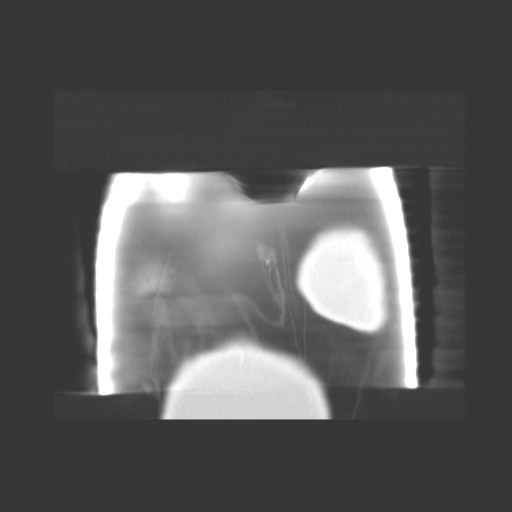
[im 37/163  lung]
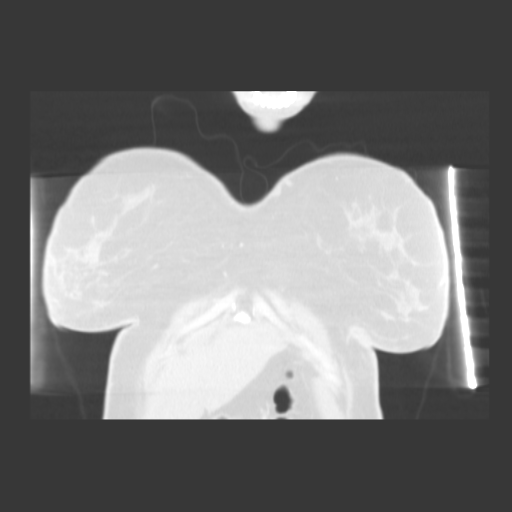
[im 55/163  lung]
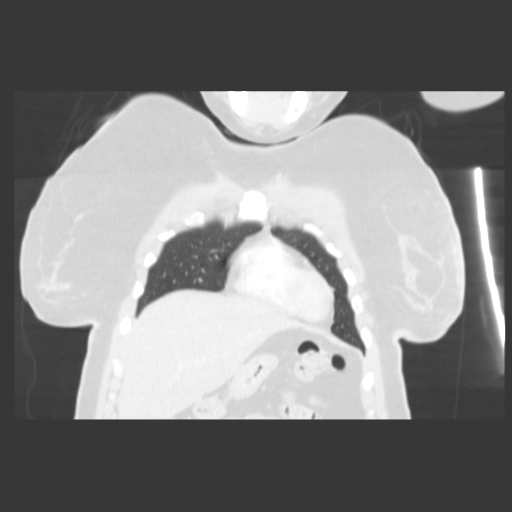
[im 73/163  lung]
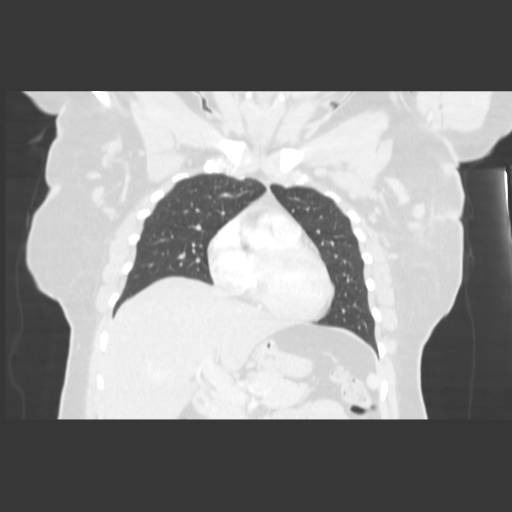
[im 91/163  lung]
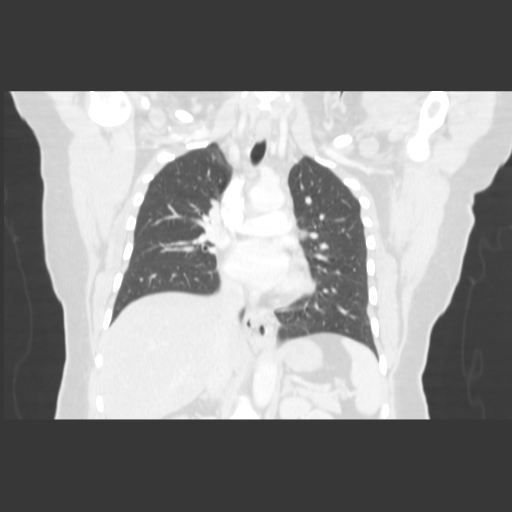
[im 109/163  lung]
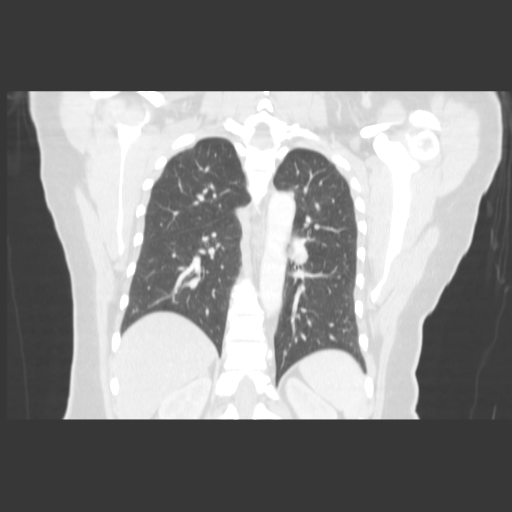

[17 of 30 positions shown; findings below may reference images not displayed]

FINDINGS: Bilateral supraclavicular lymph nodes are mildly
prominent number but not enlarged by size criteria.  Right
supraclavicular lymph node measures 7 mm.  The largest left
supraclavicular lymph node is 5 mm.

The there is bilateral axillary lymphadenopathy, left greater than
right. The overall number of lymph nodes is prominent bilaterally,
and there are several enlarged lymph nodes bilaterally.  On the
left, enlarged lymph nodes involve the level I, level II and level
III stations. Largest left retropectoral lymph node measures 10 mm
short axis.  The largest level 1 lymph node in the left axilla
measures approximately 18 mm short axis (image #25).  Adjacent to
this largest lymph node in the inferior left axilla there is
subcutaneous fat stranding which extends inferiorly and laterally,
involving the inferior left axilla/upper outer quadrant of the left
breast.  No drainable fluid collection or rim enhancing fluid
collection to suggest abscess is seen.

The largest right axillary lymph node measures 11 mm short axis.

Thyroid gland unremarkable.  No mediastinal or hilar
lymphadenopathy.  Heart size normal.

Negative for pleural or pericardial effusion.

Small hiatal hernia.

Except for mild dependent atelectasis, the lungs are clear.  The
trachea and mainstem bronchi are patent.

No suspicious osseous lesion.  Vertebral bodies are normal in
height and alignment.

Visualized portion of the upper abdomen demonstrates fatty
infiltration of the liver.
IMPRESSION: 1.  Bilateral axillary lymphadenopathy, left greater than right.
2.  Inflammatory changes of the subcutaneous fat of the inferior
left axilla/ upper outer quadrant of the left breast.  This may
reflect cellulitis/mastitis.  No drainable fluid collection or
discrete abscess is identified.
3.  Supraclavicular lymph nodes are mildly prominent in number but
not enlarged.

## 2009-09-26 ENCOUNTER — Encounter (INDEPENDENT_AMBULATORY_CARE_PROVIDER_SITE_OTHER): Payer: Self-pay | Admitting: Internal Medicine

## 2009-10-02 ENCOUNTER — Telehealth: Payer: Self-pay

## 2009-10-06 ENCOUNTER — Ambulatory Visit: Payer: Self-pay | Admitting: Infectious Disease

## 2009-10-06 ENCOUNTER — Telehealth (INDEPENDENT_AMBULATORY_CARE_PROVIDER_SITE_OTHER): Payer: Self-pay | Admitting: *Deleted

## 2009-11-03 ENCOUNTER — Ambulatory Visit: Payer: Self-pay | Admitting: Infectious Disease

## 2009-11-04 ENCOUNTER — Encounter: Payer: Self-pay | Admitting: Infectious Disease

## 2009-11-04 LAB — CONVERTED CEMR LAB
ALT: 41 units/L — ABNORMAL HIGH (ref 0–35)
AST: 26 units/L (ref 0–37)
CO2: 21 meq/L (ref 19–32)
Calcium: 9.5 mg/dL (ref 8.4–10.5)
Chloride: 107 meq/L (ref 96–112)
Lymphocytes Relative: 36 % (ref 12–46)
Lymphs Abs: 2.7 10*3/uL (ref 0.7–4.0)
MCV: 87.6 fL (ref >=78.0)
Monocytes Relative: 10 % (ref 3–12)
Neutro Abs: 2.6 10*3/uL (ref 1.7–7.7)
Neutrophils Relative %: 36 % — ABNORMAL LOW (ref 43–77)
Platelets: 425 10*3/uL — ABNORMAL HIGH (ref 150–400)
Potassium: 4.3 meq/L (ref 3.5–5.3)
RBC: 4.34 M/uL (ref 3.87–5.11)
Sodium: 143 meq/L (ref 135–145)
Total Protein: 7.2 g/dL (ref 6.0–8.3)
WBC: 7.3 10*3/uL (ref 4.0–10.5)

## 2010-02-10 ENCOUNTER — Ambulatory Visit: Payer: Self-pay | Admitting: Infectious Disease

## 2010-02-10 ENCOUNTER — Encounter (INDEPENDENT_AMBULATORY_CARE_PROVIDER_SITE_OTHER): Payer: Self-pay | Admitting: Licensed Clinical Social Worker

## 2010-02-10 LAB — CONVERTED CEMR LAB
AST: 15 units/L (ref 0–37)
Albumin: 4.5 g/dL (ref 3.5–5.2)
Alkaline Phosphatase: 89 units/L (ref 39–117)
Basophils Relative: 1 % (ref 0–1)
CO2: 25 meq/L (ref 19–32)
Chloride: 104 meq/L (ref 96–112)
Eosinophils Absolute: 0.4 10*3/uL (ref 0.0–0.7)
Eosinophils Relative: 8 % — ABNORMAL HIGH (ref 0–5)
HCT: 39.5 % (ref 36.0–46.0)
HIV 1 RNA Quant: 63200 copies/mL — ABNORMAL HIGH (ref <48)
HIV-1 RNA Quant, Log: 4.8 — ABNORMAL HIGH (ref <1.68)
Lymphs Abs: 2.6 10*3/uL (ref 0.7–4.0)
MCHC: 32.2 g/dL (ref 30.0–36.0)
MCV: 84.2 fL (ref 78.0–100.0)
Monocytes Relative: 10 % (ref 3–12)
Neutrophils Relative %: 32 % — ABNORMAL LOW (ref 43–77)
Platelets: 368 10*3/uL (ref 150–400)
Total Protein: 7.2 g/dL (ref 6.0–8.3)

## 2010-02-23 ENCOUNTER — Ambulatory Visit: Payer: Self-pay | Admitting: Infectious Disease

## 2010-04-02 ENCOUNTER — Ambulatory Visit: Payer: Self-pay | Admitting: Infectious Disease

## 2010-04-02 LAB — CONVERTED CEMR LAB
AST: 14 units/L (ref 0–37)
Alkaline Phosphatase: 114 units/L (ref 39–117)
BUN: 11 mg/dL (ref 6–23)
Basophils Absolute: 0.1 10*3/uL (ref 0.0–0.1)
Basophils Relative: 1 % (ref 0–1)
Calcium: 10 mg/dL (ref 8.4–10.5)
Creatinine, Ser: 0.94 mg/dL (ref 0.40–1.20)
Eosinophils Absolute: 0.6 10*3/uL (ref 0.0–0.7)
Eosinophils Relative: 11 % — ABNORMAL HIGH (ref 0–5)
HCT: 39.7 % (ref 36.0–46.0)
HIV 1 RNA Quant: 253 copies/mL — ABNORMAL HIGH (ref <48)
HIV-1 RNA Quant, Log: 2.4 — ABNORMAL HIGH (ref <1.68)
MCHC: 32 g/dL (ref 30.0–36.0)
MCV: 84.8 fL (ref 78.0–100.0)
Platelets: 381 10*3/uL (ref 150–400)
RDW: 14.5 % (ref 11.5–15.5)

## 2010-04-16 ENCOUNTER — Ambulatory Visit: Payer: Self-pay | Admitting: Infectious Disease

## 2010-04-16 DIAGNOSIS — R519 Headache, unspecified: Secondary | ICD-10-CM | POA: Insufficient documentation

## 2010-04-16 DIAGNOSIS — R112 Nausea with vomiting, unspecified: Secondary | ICD-10-CM | POA: Insufficient documentation

## 2010-04-16 DIAGNOSIS — R51 Headache: Secondary | ICD-10-CM

## 2010-04-16 LAB — CONVERTED CEMR LAB: Chlamydia, Swab/Urine, PCR: NEGATIVE

## 2010-05-08 ENCOUNTER — Encounter: Payer: Self-pay | Admitting: Infectious Disease

## 2010-08-06 ENCOUNTER — Emergency Department (HOSPITAL_BASED_OUTPATIENT_CLINIC_OR_DEPARTMENT_OTHER): Admission: EM | Admit: 2010-08-06 | Discharge: 2010-08-06 | Payer: Self-pay | Admitting: Emergency Medicine

## 2010-08-06 ENCOUNTER — Ambulatory Visit: Payer: Self-pay | Admitting: Interventional Radiology

## 2010-08-06 IMAGING — CR DG ABDOMEN ACUTE W/ 1V CHEST
4 series · 4 of 4 positions shown · non-contrast
Comparison: [DATE], [DATE]

CLINICAL DATA: Nausea, vomiting, HIV positive

ACUTE ABDOMEN SERIES (ABDOMEN 2 VIEW & CHEST 1 VIEW)

[w chest pa]
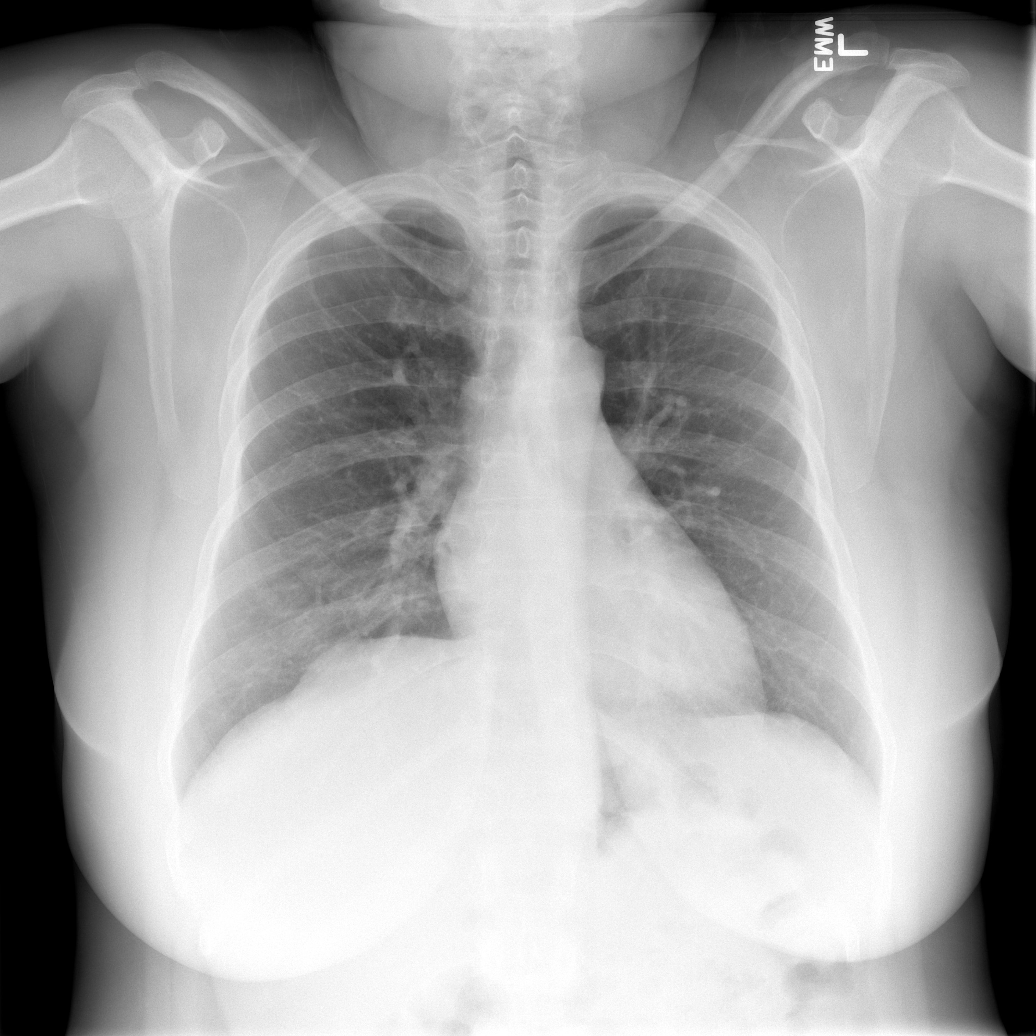

[w abdomen upright]
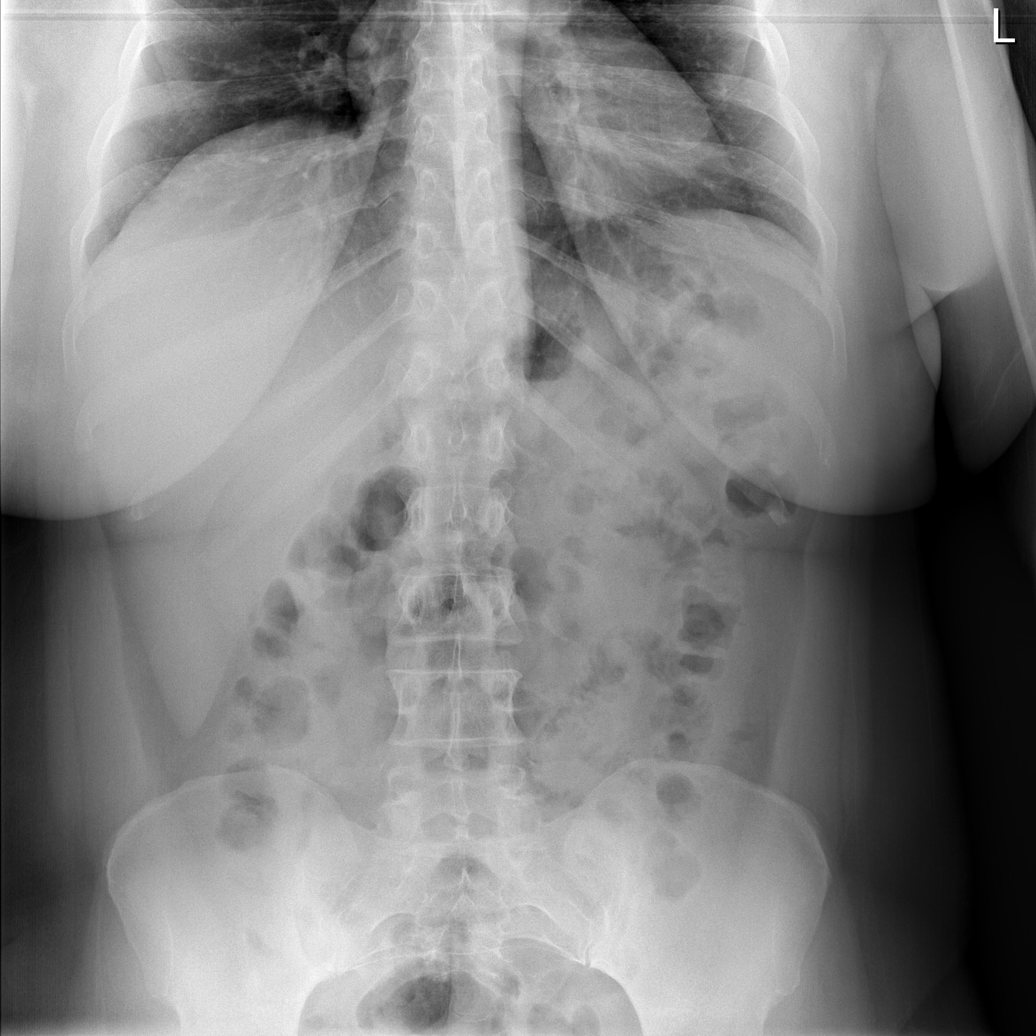

[t abdomen supine (1 of 2)]
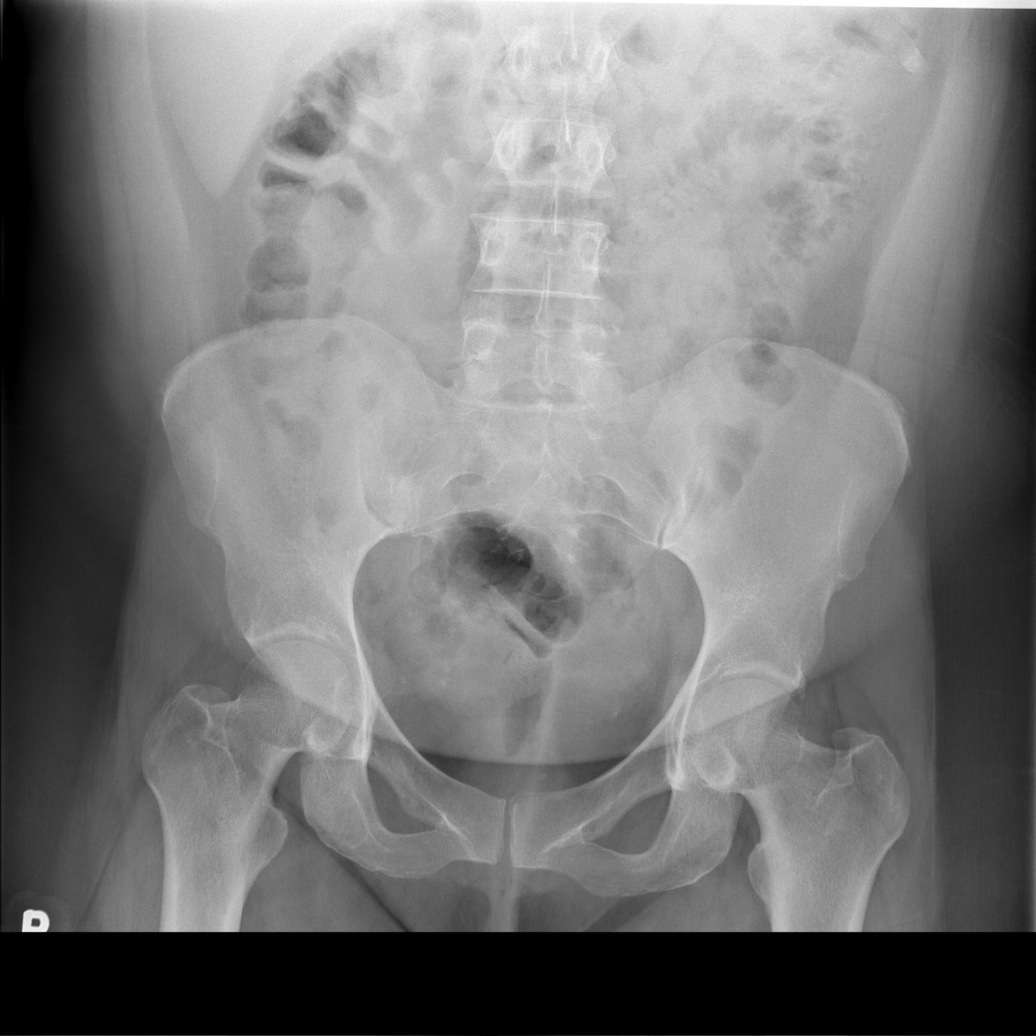

[t abdomen supine (2 of 2)]
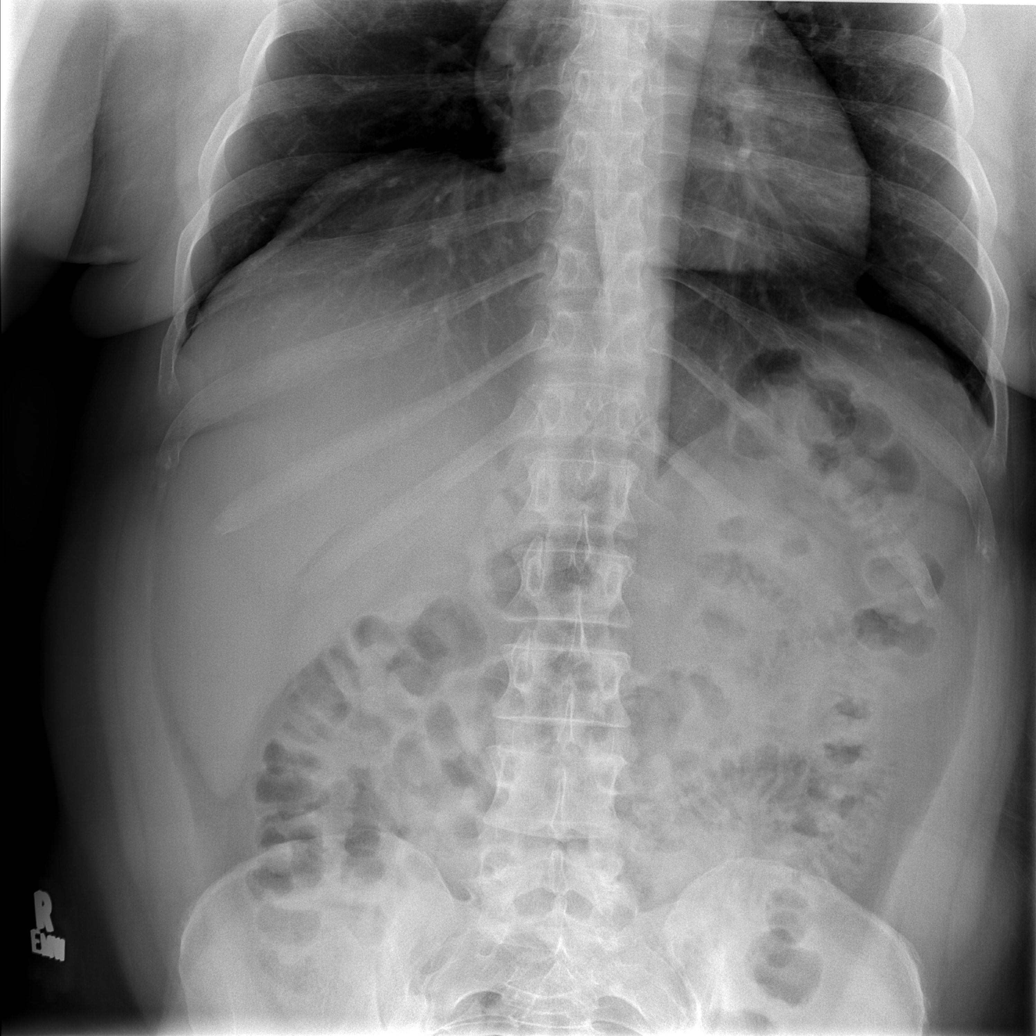

[4 of 4 positions shown; findings below may reference images not displayed]

FINDINGS: Normal heart size and vascularity.  No focal pneumonia,
edema, effusion or pneumothorax.  Midline trachea.

Normal bowel gas pattern.  Negative for obstruction, dilatation, or
ileus.  No free air demonstrated.  No abnormal calcifications.  No
acute osseous finding.
IMPRESSION: No acute finding by plain radiography.

## 2010-09-29 ENCOUNTER — Encounter (INDEPENDENT_AMBULATORY_CARE_PROVIDER_SITE_OTHER): Payer: Self-pay | Admitting: *Deleted

## 2010-11-20 ENCOUNTER — Encounter (INDEPENDENT_AMBULATORY_CARE_PROVIDER_SITE_OTHER): Payer: Self-pay | Admitting: *Deleted

## 2010-11-29 LAB — CONVERTED CEMR LAB
ALT: 19 units/L (ref 0–35)
AST: 15 units/L (ref 0–37)
Albumin: 4.5 g/dL (ref 3.5–5.2)
Alkaline Phosphatase: 75 units/L (ref 39–117)
BUN: 9 mg/dL (ref 6–23)
Basophils Absolute: 0 10*3/uL (ref 0.0–0.1)
Basophils Relative: 0 % (ref 0–1)
CD4 Count: 70 microliters
CO2: 23 meq/L (ref 19–32)
Calcium: 9.3 mg/dL (ref 8.4–10.5)
Chloride: 104 meq/L (ref 96–112)
Cholesterol: 179 mg/dL (ref 0–200)
Creatinine, Ser: 0.99 mg/dL (ref 0.40–1.20)
Eosinophils Absolute: 0.1 10*3/uL (ref 0.0–0.7)
Eosinophils Relative: 3 % (ref 0–5)
Glucose, Bld: 92 mg/dL (ref 70–99)
HCT: 45.8 % (ref 36.0–46.0)
HDL: 36 mg/dL — ABNORMAL LOW (ref >39)
HIV 1 RNA Quant: 33400 copies/mL — ABNORMAL HIGH (ref <50)
HIV-1 RNA Quant, Log: 4.52 — ABNORMAL HIGH (ref <1.70)
Hemoglobin: 14.9 g/dL (ref 12.0–15.0)
LDL Cholesterol: 131 mg/dL — ABNORMAL HIGH (ref 0–99)
Lymphocytes Relative: 26 % (ref 12–46)
Lymphs Abs: 1.2 10*3/uL (ref 0.7–3.3)
MCHC: 32.5 g/dL (ref 30.0–36.0)
MCV: 86.4 fL (ref 78.0–100.0)
Monocytes Absolute: 0.4 10*3/uL (ref 0.2–0.7)
Monocytes Relative: 10 % (ref 3–11)
Neutro Abs: 2.8 10*3/uL (ref 1.7–7.7)
Neutrophils Relative %: 61 % (ref 43–77)
Platelets: 334 10*3/uL (ref 150–400)
Potassium: 3.7 meq/L (ref 3.5–5.3)
RBC: 5.3 M/uL — ABNORMAL HIGH (ref 3.87–5.11)
RDW: 12.7 % (ref 11.5–14.0)
Sodium: 140 meq/L (ref 135–145)
Total Bilirubin: 0.5 mg/dL (ref 0.3–1.2)
Total CHOL/HDL Ratio: 5
Total Protein: 7.6 g/dL (ref 6.0–8.3)
Triglycerides: 62 mg/dL (ref <150)
VLDL: 12 mg/dL (ref 0–40)
WBC: 4.6 10*3/uL (ref 4.0–10.5)

## 2010-12-01 NOTE — Letter (Signed)
Summary: [facility]  [address]   [place], [place] [address]   Phone: [phone]  Fax: [phone]          May 08, 2010  [facility] [address] [place], [place]  [address]  Dear Ms. Toppin,  I attempted to call you by phone to schedule your appointment for your annual PAP smear but was unable to reach you.  Please call the number above to make an appointment for your PAP smear.   Dr. Daiva Eves and I hope to see you soon.  Thank you.   Sincerely,    Jennet Maduro [facility]

## 2010-12-01 NOTE — Miscellaneous (Signed)
  Clinical Lists Changes  Observations: Added new observation of YEARAIDSPOS: 2005  (09/29/2010 9:41)
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
## 2010-12-01 NOTE — Assessment & Plan Note (Signed)
Summary: added orders/TY
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
## 2010-12-03 NOTE — Miscellaneous (Signed)
Summary: [facility] referral  Clinical Lists Changes  Orders: Added new Referral order of Misc. Referral (Misc. Ref) - Signed 

## 2010-12-23 ENCOUNTER — Telehealth: Payer: Self-pay | Admitting: Infectious Disease

## 2010-12-25 ENCOUNTER — Encounter: Payer: Self-pay | Admitting: *Deleted

## 2010-12-29 NOTE — Progress Notes (Signed)
Summary: Patient needs FLU shot and labs drawn  Phone Note Outgoing Call   Call placed by: Acey Lav MD,  December 23, 2010 3:20 PM Details for Reason: Update Pneumococcal and flu vaccine status Summary of Call: [name] this is one of the patients who is due for flu shot. She has not been seen since June. Can you call her to come in for labs and a flu shot.? Initial call taken by: Acey Lav MD,  December 23, 2010 3:20 PM  Follow-up for Phone Call        Patient's phone was disconnected, Kennon Rounds, RN will be sending her a letter. Follow-up by: Starleen Arms CMA,  December 25, 2010 9:28 AM

## 2010-12-29 NOTE — Letter (Signed)
Summary: Generic Letter  [facility]  [address]   [place], [place] [address]   Phone: [phone]  Fax: [phone]    12/25/2010  [facility] [address] [place], [place]  [address]  Dear Ms. Platas,           Sincerely,   Golden Circle RN

## 2010-12-29 NOTE — Letter (Signed)
Summary: Generic Letter  [facility]  [address]   [place], [place] [address]   Phone: [phone]  Fax: [phone]    12/25/2010  [facility] [address] [place], [place]  [address]  Dear Ms. Presas,    I have been unable to reach you by phone. I do not see that you had your flu shot this year. Please call the office & let us know if you already had one elsewhere or if you can swing by here & get a flu shot. Thanks for your time & attention to this.       Sincerely,   Golden Circle RN

## 2011-01-07 ENCOUNTER — Encounter: Payer: Self-pay | Admitting: Infectious Disease

## 2011-01-14 ENCOUNTER — Encounter: Payer: Self-pay | Admitting: Infectious Disease

## 2011-01-14 LAB — DIFFERENTIAL
Basophils Absolute: 0.1 10*3/uL (ref 0.0–0.1)
Basophils Relative: 1 % (ref 0–1)
Eosinophils Absolute: 0.8 10*3/uL — ABNORMAL HIGH (ref 0.0–0.7)
Monocytes Relative: 5 % (ref 3–12)
Neutro Abs: 5.4 10*3/uL (ref 1.7–7.7)
Neutrophils Relative %: 65 % (ref 43–77)

## 2011-01-14 LAB — CBC
HCT: 46 % (ref 36.0–46.0)
Hemoglobin: 14.8 g/dL (ref 12.0–15.0)
MCHC: 32.2 g/dL (ref 30.0–36.0)
MCV: 88.7 fL (ref 78.0–100.0)
RDW: 12.6 % (ref 11.5–15.5)
WBC: 8.4 10*3/uL (ref 4.0–10.5)

## 2011-01-14 LAB — COMPREHENSIVE METABOLIC PANEL
ALT: 36 U/L — ABNORMAL HIGH (ref 0–35)
Alkaline Phosphatase: 120 U/L — ABNORMAL HIGH (ref 39–117)
BUN: 10 mg/dL (ref 6–23)
CO2: 26 mEq/L (ref 19–32)
GFR calc non Af Amer: >60 mL/min (ref >60)
Glucose, Bld: 86 mg/dL (ref 70–99)
Potassium: 4.6 mEq/L (ref 3.5–5.1)
Total Bilirubin: 0.9 mg/dL (ref 0.3–1.2)
Total Protein: 8.9 g/dL — ABNORMAL HIGH (ref 6.0–8.3)

## 2011-01-14 LAB — URINE CULTURE
Culture  Setup Time: 201110061830
Culture: NO GROWTH

## 2011-01-14 LAB — URINALYSIS, ROUTINE W REFLEX MICROSCOPIC
Bilirubin Urine: NEGATIVE
Hgb urine dipstick: NEGATIVE
Ketones, ur: NEGATIVE mg/dL
Specific Gravity, Urine: 1.009 (ref 1.005–1.030)
Urobilinogen, UA: 0.2 mg/dL (ref 0.0–1.0)
pH: 6 (ref 5.0–8.0)

## 2011-01-17 LAB — T-HELPER CELL (CD4) - (RCID CLINIC ONLY)
CD4 % Helper T Cell: 4 % — ABNORMAL LOW (ref 33–55)
CD4 T Cell Abs: 110 uL — ABNORMAL LOW (ref 400–2700)

## 2011-01-18 LAB — T-HELPER CELL (CD4) - (RCID CLINIC ONLY): CD4 T Cell Abs: 170 uL — ABNORMAL LOW (ref 400–2700)

## 2011-01-20 ENCOUNTER — Other Ambulatory Visit: Payer: Medicare Other | Admitting: Infectious Disease

## 2011-01-20 DIAGNOSIS — B20 Human immunodeficiency virus [HIV] disease: Secondary | ICD-10-CM

## 2011-01-20 LAB — CBC WITH DIFFERENTIAL/PLATELET
Basophils Relative: 1 % (ref 0–1)
Eosinophils Absolute: 0.5 10*3/uL (ref 0.0–0.7)
Eosinophils Relative: 10 % — ABNORMAL HIGH (ref 0–5)
Lymphs Abs: 1.4 10*3/uL (ref 0.7–4.0)
MCH: 27.3 pg (ref 26.0–34.0)
MCHC: 31.4 g/dL (ref 30.0–36.0)
MCV: 87 fL (ref 78.0–100.0)
Neutrophils Relative %: 51 % (ref 43–77)
Platelets: 305 10*3/uL (ref 150–400)
RDW: 13.3 % (ref 11.5–15.5)

## 2011-01-20 LAB — T-HELPER CELL (CD4) - (RCID CLINIC ONLY)
CD4 % Helper T Cell: 2 % — ABNORMAL LOW (ref 33–55)
CD4 T Cell Abs: 50 uL — ABNORMAL LOW (ref 400–2700)

## 2011-01-21 LAB — COMPLETE METABOLIC PANEL WITH GFR
ALT: 41 U/L — ABNORMAL HIGH (ref 0–35)
Alkaline Phosphatase: 96 U/L (ref 39–117)
CO2: 25 mEq/L (ref 19–32)
Creat: 0.82 mg/dL (ref 0.40–1.20)
GFR, Est African American: >60 mL/min (ref >60)
GFR, Est Non African American: >60 mL/min (ref >60)
Sodium: 144 mEq/L (ref 135–145)
Total Bilirubin: 0.4 mg/dL (ref 0.3–1.2)
Total Protein: 7.1 g/dL (ref 6.0–8.3)

## 2011-01-21 LAB — T-HELPER CELLS (CD4) COUNT (NOT AT ARMC)
Absolute CD4: 28 per uL — ABNORMAL LOW (ref 381–1469)
CD4 T Helper %: 2 % — ABNORMAL LOW (ref 32–62)
Total Lymphocyte: 29 % (ref 12–46)
Total lymphocyte count: 1421 per uL (ref 700–3300)
WBC, lymph enumeration: 4.9 10*3/uL (ref 4.0–10.5)

## 2011-01-22 LAB — HIV-1 RNA QUANT-NO REFLEX-BLD: HIV-1 RNA Quant, Log: 4.97 {Log} — ABNORMAL HIGH (ref <1.30)

## 2011-02-03 ENCOUNTER — Ambulatory Visit (INDEPENDENT_AMBULATORY_CARE_PROVIDER_SITE_OTHER): Payer: Medicare Other | Admitting: Infectious Disease

## 2011-02-03 VITALS — BP 135/95 | HR 91 | Temp 98.2°F | Wt 189.0 lb

## 2011-02-03 DIAGNOSIS — B009 Herpesviral infection, unspecified: Secondary | ICD-10-CM

## 2011-02-03 DIAGNOSIS — R61 Generalized hyperhidrosis: Secondary | ICD-10-CM

## 2011-02-03 DIAGNOSIS — B20 Human immunodeficiency virus [HIV] disease: Secondary | ICD-10-CM

## 2011-02-03 DIAGNOSIS — F0630 Mood disorder due to known physiological condition, unspecified: Secondary | ICD-10-CM

## 2011-02-03 DIAGNOSIS — R51 Headache: Secondary | ICD-10-CM

## 2011-02-03 DIAGNOSIS — Z780 Asymptomatic menopausal state: Secondary | ICD-10-CM | POA: Insufficient documentation

## 2011-02-03 LAB — COMPREHENSIVE METABOLIC PANEL
BUN: 8 mg/dL (ref 6–23)
CO2: 26 mEq/L (ref 19–32)
Calcium: 9.1 mg/dL (ref 8.4–10.5)
Chloride: 106 mEq/L (ref 96–112)
Creatinine, Ser: 0.99 mg/dL (ref 0.4–1.2)
GFR calc non Af Amer: >60 mL/min (ref >60)
Total Bilirubin: 0.2 mg/dL — ABNORMAL LOW (ref 0.3–1.2)

## 2011-02-03 LAB — BASIC METABOLIC PANEL
BUN: 5 mg/dL — ABNORMAL LOW (ref 6–23)
BUN: 7 mg/dL (ref 6–23)
CO2: 26 mEq/L (ref 19–32)
Calcium: 9.1 mg/dL (ref 8.4–10.5)
Chloride: 105 mEq/L (ref 96–112)
Chloride: 110 mEq/L (ref 96–112)
Creatinine, Ser: 1.22 mg/dL — ABNORMAL HIGH (ref 0.4–1.2)
Creatinine, Ser: 1.24 mg/dL — ABNORMAL HIGH (ref 0.4–1.2)
GFR calc Af Amer: 58 mL/min — ABNORMAL LOW (ref >60)
GFR calc Af Amer: >60 mL/min (ref >60)
Potassium: 4.1 mEq/L (ref 3.5–5.1)

## 2011-02-03 LAB — DIFFERENTIAL
Basophils Absolute: 0 10*3/uL (ref 0.0–0.1)
Eosinophils Relative: 14 % — ABNORMAL HIGH (ref 0–5)
Lymphocytes Relative: 32 % (ref 12–46)
Neutro Abs: 2.3 10*3/uL (ref 1.7–7.7)
Neutrophils Relative %: 42 % — ABNORMAL LOW (ref 43–77)

## 2011-02-03 LAB — URINALYSIS, MICROSCOPIC ONLY
Bilirubin Urine: NEGATIVE
Glucose, UA: NEGATIVE mg/dL
Nitrite: NEGATIVE
Specific Gravity, Urine: 1.025 (ref 1.005–1.030)
pH: 6 (ref 5.0–8.0)

## 2011-02-03 LAB — CBC
HCT: 34.9 % — ABNORMAL LOW (ref 36.0–46.0)
HCT: 36.2 % (ref 36.0–46.0)
MCHC: 33.3 g/dL (ref 30.0–36.0)
MCHC: 33.6 g/dL (ref 30.0–36.0)
MCHC: 33.8 g/dL (ref 30.0–36.0)
MCV: 85.1 fL (ref 78.0–100.0)
MCV: 85.8 fL (ref 78.0–100.0)
MCV: 85.9 fL (ref 78.0–100.0)
Platelets: 344 10*3/uL (ref 150–400)
RBC: 4.06 MIL/uL (ref 3.87–5.11)
RBC: 4.22 MIL/uL (ref 3.87–5.11)
RBC: 4.28 MIL/uL (ref 3.87–5.11)
WBC: 4.9 10*3/uL (ref 4.0–10.5)
WBC: 5.3 10*3/uL (ref 4.0–10.5)
WBC: 5.5 10*3/uL (ref 4.0–10.5)

## 2011-02-03 LAB — URINE CULTURE

## 2011-02-03 LAB — CULTURE, BLOOD (ROUTINE X 2): Culture: NO GROWTH

## 2011-02-03 MED ORDER — HYDROCODONE-ACETAMINOPHEN 5-500 MG PO TABS: 1.0000 | ORAL_TABLET | Freq: Two times a day (BID) | ORAL | Status: DC | PRN

## 2011-02-03 MED ORDER — EMTRICITABINE-TENOFOVIR DF 200-300 MG PO TABS: 1.0000 | ORAL_TABLET | Freq: Every day | ORAL | Status: DC

## 2011-02-03 MED ORDER — RALTEGRAVIR POTASSIUM 400 MG PO TABS: 400.0000 mg | ORAL_TABLET | Freq: Two times a day (BID) | ORAL | Status: DC

## 2011-02-03 MED ORDER — ESTROGENS CONJUGATED 0.3 MG PO TABS: 0.3000 mg | ORAL_TABLET | Freq: Every day | ORAL | Status: DC

## 2011-02-03 NOTE — Progress Notes (Signed)
  Subjective:    Patient ID: [name], female    DOB: 04-Apr-1966, 45 y.o.   MRN: [id number]  HPI 45 year old [place] American lady with HIV, AIDS who has been intermittently compliant with ARVwho llost her 1 yr old son this past summer. She had been compliant when numbers were last checked but she has since stopped taking her ARVs. She notes that she develops headaches when she takes these medications and in the past has taken hydrocodone for them. She also complains of hot flashes and what she interprets as postmenopausal ssx. She continues to have a diffuse rash which she thinks is HSV but which I have explained is more likely due to her HIV itself. We spent quite a bit of time to discuss an alternative antiretroviral options. I explained that while the current therapy she was on was more likely cause side effects of of gastrointestinal distress but would be more for giving of noncompliance. After further discussion we felt that a triple once daily at  [place] . poor choice for her due to her the recent death of her son and concerns that she might have the nightmares about his demise. I discussed the option of changing her to isentress twice daily which her vital once daily. This regimen we've much more free of side effects. It will have a lower threat they're threshold for element of resistance. I emphasized to her that she be hyaline here to this regimen I prescribed for her but would only one refill.   Review of Systems Patient's 12 point review of systems as per history of present illness otherwise her review of systems was negative on 12 point review for any cardiovascular hematologic endocrinologic gastrointestinal or cutaneous symptoms or psychiatric or neurological symptoms as mentioned in history of present illness    Objective:   Physical Exam [name] was alert and oriented x4 her hCG exam is also normocephalic her pupils equal round and reactive to light, oropharynx is clear  without erythema or exudate her lungs were clear to auscultation bilaterally without wheeze or rales abdomen soft nondistended nontender positive bowel sounds. Her extremities with 1+ pretibial edema. Her psychiatric exam revealed her to have normal affect. She was alert and oriented x4. A neurological exam was nonfocal. Her skin was exam was significant for an acneiform rash on her abdomen as well as more confluent macular lesions on her lower extremities.       Assessment & Plan:  HIV DISEASE As described above unwilling to change her to twice daily as  well isentress daily Truvada. She needs to be highly highly adherent to this regimen. Will bring her back to clinic in one month's time to recheck her viral load and CD4 count. If she cannot control her virus with isentress and Truvada I will change  her back to 8 for ACE inhibitor-based regimen. In the interim I am also willing to give her Vicodin for headaches although she should not have headaches with this regimen.  HSV I don't find her rash to be consistent with herpes simplex bilateral happy to continue her Valtrex for the interim  MOOD DISORDER IN CONDITIONS CLASSIFIED ELSEWHERE She appears to be coping while under the circumstances. She has support in her church. She still is grieving the loss of her son.  NIGHT SWEATS These may indeed be related to postmenopausal symptoms or related to her HIV virus infection itself. I am happy to prescribe her a low dose conjugated estrogen.

## 2011-02-03 NOTE — Patient Instructions (Signed)
We are changing you to isentress twice daily and truvada once daily IT IS VERY IMPORTANT THAT YOU TAKE THESE MEDS rtc in a month for labs and then fu with Dr Zenaida Niece dam

## 2011-02-03 NOTE — Assessment & Plan Note (Signed)
She appears to be coping while under the circumstances. She has support in her church. She still is grieving the loss of her son.

## 2011-02-03 NOTE — Assessment & Plan Note (Signed)
These may indeed be related to postmenopausal symptoms or related to her HIV virus infection itself. I am happy to prescribe her a low dose conjugated estrogen.

## 2011-02-03 NOTE — Assessment & Plan Note (Signed)
As described above I will prescribe her a low-dose estrogen

## 2011-02-03 NOTE — Assessment & Plan Note (Signed)
As described above unwilling to change her to twice daily as  well isentress daily Truvada. She needs to be highly highly adherent to this regimen. Will bring her back to clinic in one month's time to recheck her viral load and CD4 count. If she cannot control her virus with isentress and Truvada I will change  her back to 8 for ACE inhibitor-based regimen. In the interim I am also willing to give her Vicodin for headaches although she should not have headaches with this regimen.

## 2011-02-03 NOTE — Assessment & Plan Note (Signed)
I don't find her rash to be consistent with herpes simplex bilateral happy to continue her Valtrex for the interim

## 2011-02-04 LAB — T-HELPER CELL (CD4) - (RCID CLINIC ONLY)
CD4 % Helper T Cell: 2 % — ABNORMAL LOW (ref 33–55)
CD4 T Cell Abs: 50 uL — ABNORMAL LOW (ref 400–2700)

## 2011-03-08 ENCOUNTER — Ambulatory Visit: Payer: Medicare Other | Admitting: Infectious Disease

## 2011-03-15 ENCOUNTER — Ambulatory Visit: Payer: Medicare Other | Admitting: Infectious Disease

## 2011-03-19 NOTE — Consult Note (Signed)
NAMEJONITA, HIROTA                        ACCOUNT NO.:  [id number]   MEDICAL RECORD NO.:  [id number]                   PATIENT TYPE:  INP   LOCATION:  5009                                 FACILITY:  [facility]   PHYSICIAN:  Naima A. Dillard, M.D.              DATE OF BIRTH:  [birth year]   DATE OF CONSULTATION:  01/15/2004  DATE OF DISCHARGE:                                   CONSULTATION   Ms. Kretzschmar is a 45 year old gravida 1, para 1, whose last menstrual  period was in 1993 secondary to a hysterectomy.   Dictation ended at this point.                                               [name], M.D.    NAD/MEDQ  D:  01/16/2004  T:  01/16/2004  Job:  [id number]

## 2011-03-19 NOTE — Discharge Summary (Signed)
[name], [name]                        ACCOUNT NO.:  [id number]   MEDICAL RECORD NO.:  [id number]                   PATIENT TYPE:  INP   LOCATION:  5009                                 FACILITY:  [facility]   PHYSICIAN:  Ursula Beath, MD               DATE OF BIRTH:  12-Jul-1966   DATE OF ADMISSION:  01/02/2004  DATE OF DISCHARGE:  01/20/2004                                 DISCHARGE SUMMARY   FINAL DIAGNOSES:  1. Human immunodeficiency virus infection.  2. Methicillin resistant Staphylococcus aureus abscesses.  3. Trichomonas.  4. Candidiasis.  5. Elevated liver function tests.   DISCHARGE MEDICATIONS:  1. Doxycycline 100 mg 1 p.o. b.i.d.  2. Sarna lotion apply b.i.d.  3. Percocet 1-2 tablets q.4-6h. p.r.n. pain.   DISPOSITION:  Discharge to home.  Follow up will be with Dr. [name] in the  infectious disease clinic on Wednesday, February 05, 2004 at 10:15 a.m. Activity  is as tolerated.  Diet is normal. Wound care will be provided by [facility].   PROCEDURE:  1. PICC line placement.  2. Incision and drainage of right back abscess.  3. CT scan of the abdomen and pelvis showing no upper abdomen findings and     multi-septated cystic lesions in the deep pelvis bilaterally probably     ovarian.  4. A pelvic ultrasound showing a left hydrosalpinx probable hemorrhagic cyst     involving the right ovary, inability to visualize the left ovary, status     post hysterectomy.  5. She also had chest x-rays which were negative for acute cardiac or     pulmonary process.   CONSULTATIONS:  1. Gynecology with Dr. Normand Sloop.  2. Surgery with Dr. Aris Georgia.  3. Infectious disease Dr. [name].  4. Care management.  5. Physical therapy and occupational therapy.   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 Her most recent hospitalization was January, 2005  at [facility].  The patient reports being placed on antibiotics,  notably minocycline and then doing well until three weeks prior to admission  when she had a recurrence of boils on her back, buttocks and breasts.  The  patient complains of chills and fever.  However, she mentioned that given  her recent HIV diagnosis, she has increased alcohol consumption, increased  tobacco abuse and recent crack consumption.   PAST MEDICAL HISTORY:  1. Includes those listed in HPI.  2. Status post hysterectomy.  3. Status post tonsillectomy.   MEDICATIONS:  Multivitamins, minocycline and Goody's powders.  She is a  current half pack per day smoker, she uses alcohol and crack cocaine.   REVIEW OF SYSTEMS:  Positive for chills, fatigue and cough with clear  sputum.   PHYSICAL EXAMINATION:  VITAL SIGNS: Temperature on admission 98.5, pulse 78,  respirations 22, blood pressure 125/84, saturating 100% on room air.  GENERAL: She has had minimal distress secondary to pain, alert and oriented  times four.  HEENT: Pupils are equal, round and reactive to light, extraocular motions  are intact.  The oropharynx is clear.  NECK: The neck is supple with no JVD or thyromegaly.  LUNGS: Clear to auscultation bilaterally, good air movement.  HEART: Regular rate and rhythm, no murmurs, rubs or gallops noted.  ABDOMEN: Soft, nontender and nondistended with normoactive bowel sounds.  EXTREMITIES:  No clubbing, cyanosis or edema.  SKIN:  Multiple areas of abscess, one open wound with purulent discharge  approximately 1 cm in diameter and 1 cm deep on her left back and scapular  region.  She has __________ closed abscesses, one on the right buttock and  one on her left lower back and one on the right medial aspect of her breast.  She also has large area of her right inguinal region.  It is indurated and  tender.  NEUROLOGIC: Nonfocal.  PSYCHIATRIC:  She appears to be somewhat anxious.   HOSPITAL  COURSE:  1. Human immunodeficiency virus (HIV) infection.  The patient was initially     started on treatment for PCP prophylaxis.  She was not started on HAART     treatment during this hospitalization and is to be deferred to outpatient     clinic.  2. MRSA abscesses.  She was started on vancomycin and did well for the first     week or so of this.  She apparently had a reaction about two weeks into     her vancomycin therapy and was changed to clindamycin as her MRSA     organism is susceptible to this.  After she was seen by gynecology for a     questionable abscess versus cyst in her pelvis, she was changed to     doxycycline as this would cover both for recurrence of PID although this     was not thought to be the case and MRSA.  This is the medication the     patient will be discharged on.  3. Trichomonas. The patient was found to have Trichomonas on a Papanicolaou     smear performed by Dr. Normand Sloop.  The Papanicolaou smear was within normal     limits and the patient was treated with a 2 gram dose of Flagyl.  4. Candidiasis.  The patient was treated with Diflucan and Miconazole cream.  5. Elevated LFTs, the patient's liver function.  The patient developed a     fever at some point in her hospitalization and her liver function tests     became elevated.  On January 14, 2004 her alkaline phosphatase was 322,     total bilirubin 0.6, SGOT 219, SGPT 187, total protein 6.1, albumin 3.     Workup was done to assess for hepatitis.  All tests were negative.  She     was also tested for influenzae A and B.  These were negative.  Malachi Carl virus was also negative.  There was some concern for lymphoma given     her HIV diagnosis.  CT scan did not reveal any lymphadenopathy.  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  Her  liver function tests  trended down over the     course of her hospitalization and at discharge were still mildly elevated     total bilirubin 0.3, alkaline phosphatase 215, SGOT 34, SGPT 52, total     protein 5.8 and albumin 2.9.  It should also be noted     that the patient was started on Bactrim and when this was stopped, her     LFTs began to trend down.  It is possible that the mass was the cause of     this as well.  It is suggested that follow up liver function tests be     obtained as an outpatient.                                                Ursula Beath, MD    JT/MEDQ  D:  01/20/2004  T:  01/22/2004  Job:  [id number]   cc:   [facility], [place] [facility]

## 2011-03-19 NOTE — Discharge Summary (Signed)
NAMEANGELLY, SPEARING            ACCOUNT NO.:  [id number]   MEDICAL RECORD NO.:  [id number]          PATIENT TYPE:  INP   LOCATION:  3743                         FACILITY:  [facility]   PHYSICIAN:  Alvester Morin, M.D.  DATE OF BIRTH:  1966-04-03   DATE OF ADMISSION:  09/23/2004  DATE OF DISCHARGE:  09/28/2004                                 DISCHARGE SUMMARY   DISCHARGE DIAGNOSIS:  1.  Methicillin resistant Staph aureus abscesses on abdomen, both upper      extremities, and back.  2.  HIV diagnosed in March 2005.  3.  History of Methicillin resistant Staph aureus in March 2005.  4.  History of partial hysterectomy for fibroids in 1993.  5.  Status post tonsillectomy.  6.  History of Trichomoniasis and Candidiasis in March 2005.   DISCHARGE MEDICATIONS:  The patient was discharged on Bactrim DS 1 tablet  b.i.d. for seven days followed by one tablet daily, Kaletra four tablets  daily, Truvada once daily, Valtrex 500 mg b.i.d.   FOLLOW UP:  The patient has follow up with Dr. Roxan Hockey in clinic on  December 12 at 10 a.m., at this time, Dr. Roxan Hockey will decide any change in  HIV meds.   PROCEDURE:  I&D was done on two abscesses, one below the right breast and  the other on the left side of the back by Dr. Eliane Decree and Dr. Harvie Junior.  As  the patient was uncooperative punch biopsy for skin lesions was not  attempted.   CONSULTATIONS:  There were no consultations for this patient.   HISTORY OF PRESENT ILLNESS:  [name] is a 45 year old African American  female patient with HIV  and MRSA infection presented to the clinic to Dr.  Roxan Hockey with complaint of multiple boils over abdomen, both hands, and back  in varying sizes.  First started on the left hand seven days prior to  admission then developed on the abdomen, was associated with fever and  itching.  The abscesses were in various stages of evolution with some  surrounded by an area of induration, some popping off and did not  notice any  fever.  No history of sick contact, nausea and vomiting.  The patient had  stopped taking her HIV meds for two weeks prior to this admission.   PHYSICAL EXAMINATION:  On examination, temperature 97.7, pulse 81,  respiratory rate 16, blood pressure 129/88, saturating 100% on room air.  General:  Moderately built female in no acute distress.  Eyes:  Pupils round  and reactive to light.  Extraocular movements intact.  ENT:  No congestion  present.  Respiratory:  Good air movement, air entry bilaterally equal, no  rales, rhonchi, or wheezing.  Cardiovascular:  S1 and S2 normal, no rubs,  murmurs, or gallops.  Abdomen:  Soft, nontender, bowel sounds present.  Multiple abscesses present over abdominal wall, one below the right breast 2  by 2 cm, indurated, the rest are 0.5 cm, multiple old scars present.  Skin  dry.  Extremities:  __________ bilaterally.  Skin dry with multiple old  healed scar.  Neuro grossly  nonfocal.  Musculoskeletal:  Normal range of  movement. Psychiatric appropriate.   LABS ON ADMISSION:  Sodium 138, potassium 4.1, chloride 109, bicarb 20, BUN  5, creatinine 0.9, glucose 104.  Hemoglobin 13.9, WBC 7.2, platelets 386.  ANT 4.3 which, on repeat, her RPR was negative.  Liver function tests were  normal.  Wound culture grew Methicillin resistant Staph aureus sensitive to  Doxycycline and Bactrim.   HOSPITAL COURSE:  Problem 1:  MRSA, multiple abscesses.  The patient was started on IV  vancomycin considering the past history of MRSA history that the patient has  had multiple times.  The patient did respond to it, her abscesses were  getting small and drying up.  The one below her right breast and on her back  continued to be the same size as on admission.  I&D was done for abscess on  back and the one below the right breast.  The patient was discharged on  Bactrim DS b.i.d. for seven days.  On discharge, most of her abscesses were  dry, no oozing was present  from the abscesses that had I&D.   Problem 2:  HIV.  HIV medication was not started while the patient was in  the hospital.  When the patient was discharged on November 28, they were  started and she is to follow up with Dr. Roxan Hockey for management of that.   Problem 3:  Tachycardia with elevated blood pressure.  On the night of  admission, the patient had an episode of elevated heart rate 140 sinus with  increased blood pressure.  The patient was saturating 100%.  Cardiac enzymes  were cycled which were negative.  EKG was negative.  There was no repeat  episode of this.       SP/MEDQ  D:  09/29/2004  T:  09/29/2004  Job:  [id number]   cc:   Dr. Roxan Hockey

## 2011-03-19 NOTE — Consult Note (Signed)
NAMEMARGAREE, [name]                        ACCOUNT NO.:  [id number]   MEDICAL RECORD NO.:  [id number]                   PATIENT TYPE:  OBV   LOCATION:  5009                                 FACILITY:  [facility]   PHYSICIAN:  Velora Heckler, M.D.                DATE OF BIRTH:  05-Jun-1966   DATE OF CONSULTATION:  01/03/2004  DATE OF DISCHARGE:                                   CONSULTATION   REFERRING PHYSICIAN:  Hettie Holstein, D.O.   PRIMARY CARE PHYSICIAN:  Ileana Roup, M.D.   CHIEF COMPLAINT:  Multiple cutaneous lesions consistent with Staph  infection, rule out abscess.   HISTORY OF PRESENT ILLNESS:  The patient is a 45 year old black female  admitted on the medical teaching service on January 02, 2004, for newly  diagnosed HIV infection and recurrent Staph infection at multiple sites on  the skin.   The patient has had lesions dating for several months.  Some of these she  has drained herself.  Some have been drained at [facility].  The patient now is admitted on the teaching service for  management.  She is on antibiotic therapy.  However, there is a painful  lesion particularly on the right mid back and left medial thigh for which  they seek surgical evaluation for possible need for incision and drainage.   PAST MEDICAL HISTORY:  1. History of recurrent Staph infections of the skin.  2. Status post partial hysterectomy.  3. Status post tonsillectomy.  4. Recent diagnosis of HIV positivity.   MEDICATIONS:  Goody's powders p.r.n.   ALLERGIES:  No known drug allergies.   SOCIAL HISTORY:  The patient is from [facility].  She is unemployed.  She  has multiple substance abuse.   PHYSICAL EXAMINATION:  GENERAL:  A 45 year old black female in no acute  distress.  VITAL SIGNS: Temperature 97.7, pulse 75, blood pressure 115/68, O2  saturation 99% on room air.  HEENT:  Normocephalic.  Sclerae clear.  Conjunctivae are clear.  Dentition  is fair.  Mucous  membranes are moist.  NECK:  Supple without mass.  LUNGS:  Clear to auscultation.  CARDIAC:  Regular rate and rhythm.  ABDOMEN:  Soft.  There are multiple small petechial lesions across the lower  abdominal wall.  BACK:  A 1 cm open wound in the left upper back.  There is 1/4 inch Iodoform  packing in place with purulent drainage.  There is no sign of cellulitis.  There is a firm indurated area in the right mid back extending for  approximately 5 to 7 cm over the paraspinous muscle.  It is quite tender.  There is no ulcer.  There is no drainage.  There is no fluctuance.  EXTREMITIES:  A 2 cm indurated tender lesion left proximal thigh medially.  BREASTS:  A 1 cm ulcerated lesion right medial breast with drainage.   LABORATORY  DATA:  White count 6.7.  Differential shows 74% segmented  neutrophils.  Cultures of cutaneous lesions are sent and pending.   IMPRESSION:  Multiple cutaneous lesions likely secondary to recurrent Staph  infection with methicillin-resistant Staphylococcus aureus in a compromised  host.   PLAN:  1. Agree with antibiotic treatment.  2. Check culture results.  3. Antibacterial soap showers daily.  4. Local wound care.  5. We will follow closely, and if indicated, perform incision and drainage     of abscessed lesions as they occur.                                               Velora Heckler, M.D.    TMG/MEDQ  D:  01/03/2004  T:  01/05/2004  Job:  [id number]   cc:   Ileana Roup, M.D.  [address], [place] [address]  Fax: [phone]   Hettie Holstein, D.O.  Fax: [phone]

## 2011-03-19 NOTE — Discharge Summary (Signed)
[name], [name]                        ACCOUNT NO.:  [id number]   MEDICAL RECORD NO.:  [id number]                   PATIENT TYPE:  INP   LOCATION:  5009                                 FACILITY:  [facility]   PHYSICIAN:  Naima A. Dillard, M.D.              DATE OF BIRTH:  [birth year]   DATE OF ADMISSION:  01/02/2004  DATE OF DISCHARGE:                                 DISCHARGE SUMMARY   HISTORY:  [name] is a 45 year old gravida 1, para 1 whose last  menstrual period was 1993 secondary to a hysterectomy for fibroids. I was  called to see the patient secondary to ovarian cyst and left hydrosalpinx  found on CT and ultrasound. She denies any severe pelvic pain, nausea,  vomiting, or urinary tract infection symptoms. Her last intercourse was in  December 2004 and she denies any history of PID or STDs.   PAST MEDICAL HISTORY:  Significant for menarche age 33, returning every  month lasting for five days. Denies any history of an abnormal Pap smear.  Her last Pap was in 2000. Her last intercourse was December 2004. Past  medical history is significant for newly diagnosed AIDS.   ALLERGIES:  No known drug allergies.   PAST SURGICAL HISTORY:  Significant for a total abdominal hysterectomy in  1993.   PAST OB HISTORY:  Significant for a C-section x1 in 1982.   SOCIAL HISTORY:  Significant for a pack a day of tobacco and cocaine use.  She says she has been clean for two years, but reused when she found out she  was HIV positive.   FAMILY HISTORY:  Unremarkable for GYN cancer.   MEDICATIONS:  1. Zithromax,  2. Clindamycin.  3. Benadryl.  4. Tylenol.  5. Phenergan.  6. Folic acid.   PHYSICAL EXAMINATION:  VITAL SIGNS:  Her T-max was 103, T-current 97.1.  Pulse 79, respiratory rate 99, blood pressure 122/84.  HEART:  Regular rate and rhythm.  ABDOMEN:  Soft and nontender with no rebound and no guarding.  VAGINAL:  Vaginal exam is without lesion. Uterus and cervix are  surgically  absent. The vaginal is within normal limits. There was no adnexal tenderness  bilaterally.  BREAST:  Her left breast exam was within normal limits without any masses or  skin retraction. Her right breast there is a 2 cm mobile, tender mass on the  outer quadrant of the breast. No nipple discharge bilaterally. No skin  retraction bilaterally. The patient had axillary pain and I was unable to do  an adequate axillary exam.   LABORATORY DATA:  Ultrasound was significant for a left hydrosalpinx. The  left ovary was not visualized. The right ovary measured 3.8x2.5x2.7 cm with  a 3.3 cyst, a hemorrhagic cyst.   Her labs were 9.0. Hemoglobin 13.3, platelets 298. Her alk phos was elevated  at 272. AST 141, ALT 136. Hepatitis B  surface antigen was negative. HIV RNA  count was 87,700. Gonorrhea and Chlamydia culture done on March 4 were  negative.   ASSESSMENT:  1. Acquired immune deficiency syndrome.  2. Right breast mass.  3. Left hydrosalpinx and right ovarian cyst with febrile morbidity.   This is unlikely pelvic inflammatory disease. The patient has no cervix and  uterus to introduce the pathogens into the pelvic cavity, so it is very  unlikely to be PID that is normally seen. Her last intercourse, also was in  December 2004, which would be a very late presentation. It could be  reactivation of previous pelvic inflammatory disease, however, the patient  denies any history of PID or sexually transmitted diseases. From this point  I would just recommend a repeat ultrasound to evaluate the hydrosalpinx and  hemorrhagic cyst in six to twelve weeks. The cyst especially will hopefully  resolve at that point. If you desire to cover for PID I would suggest either  ampicillin, gentamicin, and clindamycin or cefotetan and doxycycline. You  can also consult with ID since the patient is already on Zithromax and  clindamycin. I did do a Pap smear and the patient should have a Pap smear   every six months. The patient was also found to have a right solid breast  mass on the outer quadrant of the breast. I recommend a mammogram and  ultrasound and depending on the findings the patient may need a surgery  consult or may need a biopsy of the mass. I would not recommend surgery at  this time to evaluate the patient's pelvis and I doubt that this is the  cause of her febrile morbidity. I appreciate the consult. Please call us if  you have any further consultations. The patient can follow-up with [place] in the  office or at [facility]. Our number is [phone].                                                [name], M.D.    NAD/MEDQ  D:  01/16/2004  T:  01/16/2004  Job:  [id number]

## 2011-04-05 ENCOUNTER — Other Ambulatory Visit: Payer: Medicare Other

## 2011-04-19 ENCOUNTER — Ambulatory Visit (INDEPENDENT_AMBULATORY_CARE_PROVIDER_SITE_OTHER): Payer: Medicare Other | Admitting: Infectious Disease

## 2011-04-19 DIAGNOSIS — Z23 Encounter for immunization: Secondary | ICD-10-CM

## 2011-04-19 DIAGNOSIS — B20 Human immunodeficiency virus [HIV] disease: Secondary | ICD-10-CM

## 2011-04-19 DIAGNOSIS — Z780 Asymptomatic menopausal state: Secondary | ICD-10-CM

## 2011-04-19 DIAGNOSIS — Z207 Contact with and (suspected) exposure to pediculosis, acariasis and other infestations: Secondary | ICD-10-CM | POA: Insufficient documentation

## 2011-04-19 DIAGNOSIS — R61 Generalized hyperhidrosis: Secondary | ICD-10-CM

## 2011-04-19 DIAGNOSIS — Z113 Encounter for screening for infections with a predominantly sexual mode of transmission: Secondary | ICD-10-CM

## 2011-04-19 DIAGNOSIS — Z2089 Contact with and (suspected) exposure to other communicable diseases: Secondary | ICD-10-CM | POA: Insufficient documentation

## 2011-04-19 DIAGNOSIS — R21 Rash and other nonspecific skin eruption: Secondary | ICD-10-CM

## 2011-04-19 DIAGNOSIS — N951 Menopausal and female climacteric states: Secondary | ICD-10-CM

## 2011-04-19 DIAGNOSIS — R51 Headache: Secondary | ICD-10-CM

## 2011-04-19 LAB — CBC WITH DIFFERENTIAL/PLATELET
Basophils Relative: 1 % (ref 0–1)
Eosinophils Absolute: 0.5 10*3/uL (ref 0.0–0.7)
Eosinophils Relative: 9 % — ABNORMAL HIGH (ref 0–5)
Hemoglobin: 13.7 g/dL (ref 12.0–15.0)
MCH: 29 pg (ref 26.0–34.0)
MCHC: 33.3 g/dL (ref 30.0–36.0)
Monocytes Relative: 13 % — ABNORMAL HIGH (ref 3–12)
Neutrophils Relative %: 40 % — ABNORMAL LOW (ref 43–77)

## 2011-04-19 LAB — COMPREHENSIVE METABOLIC PANEL
ALT: 37 U/L — ABNORMAL HIGH (ref 0–35)
CO2: 25 mEq/L (ref 19–32)
Calcium: 9.9 mg/dL (ref 8.4–10.5)
Chloride: 105 mEq/L (ref 96–112)
Sodium: 140 mEq/L (ref 135–145)
Total Bilirubin: 0.4 mg/dL (ref 0.3–1.2)
Total Protein: 7.6 g/dL (ref 6.0–8.3)

## 2011-04-19 MED ORDER — ESTROGENS CONJUGATED 0.3 MG PO TABS: 0.3000 mg | ORAL_TABLET | Freq: Every day | ORAL | Status: DC

## 2011-04-19 MED ORDER — HYDROCODONE-ACETAMINOPHEN 5-500 MG PO TABS: 1.0000 | ORAL_TABLET | Freq: Two times a day (BID) | ORAL | Status: DC | PRN

## 2011-04-19 MED ORDER — PERMETHRIN 5 % EX CREA: TOPICAL_CREAM | CUTANEOUS | Status: DC

## 2011-04-19 NOTE — Assessment & Plan Note (Signed)
Continue prezista, norvir and truvada, OI prophylaxis with dapsone

## 2011-04-19 NOTE — Assessment & Plan Note (Signed)
Continue to treat her HIV. Could use trial of topical triamcinolone

## 2011-04-19 NOTE — Assessment & Plan Note (Signed)
I have rx her some premarin, understanding the levels will be reduced by norvir and this will not be reliable for birth control--which is not the desired purpose here

## 2011-04-19 NOTE — Assessment & Plan Note (Signed)
Will treat for scabies x 2 doses of permethrin

## 2011-04-19 NOTE — Progress Notes (Signed)
  Subjective:    Patient ID: [name], female    DOB: 02/05/1966, 45 y.o.   MRN: [id number]  HPI  45 year old [place] American lady with HIV, AIDS who has been intermittently compliant with ARV. At her last visit during which she refused influnenza vaccination, I wrote scripts to change her to isentress and truvada with warning that this regimen was less forgiving. Apparently my caution had made her concerned enough that she went back to her prezista,norvir and truvada. She brought in the unopened bottle of isentress to me.   She is concerned that she might have contracted scabies from a family friend. She has been suffering from intense pruritis. On exam she has her chronic skin findings and perhaps a few areas on her writs for example where there was some cracked skin that could have been c/w scabies. I spent over 45 minutes with the patient including greater than 50% of time in counselling the patient face to face.     Review of Systems    as in HPI otherwise negative on 12 point review Objective:   Physical Exam  Constitutional: She is oriented to person, place, and time. She appears well-developed. No distress.  HENT:  Head: Normocephalic.  Nose: Nose normal.  Mouth/Throat: Oropharynx is clear and moist. No oropharyngeal exudate.  Eyes: Conjunctivae and EOM are normal. Pupils are equal, round, and reactive to light. No scleral icterus.  Neck: Normal range of motion. Neck supple. No JVD present.  Cardiovascular: Normal rate, regular rhythm and normal heart sounds.  Exam reveals no gallop and no friction rub.   No murmur heard. Pulmonary/Chest: Effort normal and breath sounds normal. No respiratory distress. She has no wheezes. She has no rales. She exhibits no tenderness.  Abdominal: She exhibits no distension. There is no tenderness.  Musculoskeletal: She exhibits no edema and no tenderness.  Lymphadenopathy:    She has no cervical adenopathy.  Neurological: She is alert  and oriented to person, place, and time. She exhibits normal muscle tone. Coordination normal.  Skin: Rash noted. She is not diaphoretic. No erythema. No pallor.  Psychiatric: Judgment normal. Her mood appears anxious. Her affect is not angry and not inappropriate.          Assessment & Plan:  HIV DISEASE Continue prezista, norvir and truvada, OI prophylaxis with dapsone  MENOPAUSAL SYNDROME I have rx her some premarin, understanding the levels will be reduced by norvir and this will not be reliable for birth control--which is not the desired purpose here  RASH AND OTHER NONSPECIFIC SKIN ERUPTION Continue to treat her HIV. Could use trial of topical triamcinolone  Scabies exposure Will treat for scabies x 2 doses of permethrin

## 2011-04-20 LAB — HIV-1 RNA ULTRAQUANT REFLEX TO GENTYP+: HIV 1 RNA Quant: 79100 copies/mL — ABNORMAL HIGH (ref <20)

## 2011-04-21 ENCOUNTER — Other Ambulatory Visit (INDEPENDENT_AMBULATORY_CARE_PROVIDER_SITE_OTHER): Payer: Medicare Other | Admitting: Licensed Clinical Social Worker

## 2011-04-21 DIAGNOSIS — B009 Herpesviral infection, unspecified: Secondary | ICD-10-CM

## 2011-04-21 MED ORDER — VALACYCLOVIR HCL 1 G PO TABS: 1000.0000 mg | ORAL_TABLET | Freq: Every day | ORAL | Status: DC

## 2011-04-22 ENCOUNTER — Other Ambulatory Visit (INDEPENDENT_AMBULATORY_CARE_PROVIDER_SITE_OTHER): Payer: Medicare Other | Admitting: Licensed Clinical Social Worker

## 2011-04-22 DIAGNOSIS — Z113 Encounter for screening for infections with a predominantly sexual mode of transmission: Secondary | ICD-10-CM

## 2011-04-22 DIAGNOSIS — N951 Menopausal and female climacteric states: Secondary | ICD-10-CM

## 2011-04-22 DIAGNOSIS — B009 Herpesviral infection, unspecified: Secondary | ICD-10-CM

## 2011-04-22 DIAGNOSIS — B20 Human immunodeficiency virus [HIV] disease: Secondary | ICD-10-CM

## 2011-04-22 DIAGNOSIS — R61 Generalized hyperhidrosis: Secondary | ICD-10-CM

## 2011-04-22 DIAGNOSIS — R21 Rash and other nonspecific skin eruption: Secondary | ICD-10-CM

## 2011-04-22 DIAGNOSIS — R51 Headache: Secondary | ICD-10-CM

## 2011-04-22 DIAGNOSIS — Z23 Encounter for immunization: Secondary | ICD-10-CM

## 2011-04-22 DIAGNOSIS — Z207 Contact with and (suspected) exposure to pediculosis, acariasis and other infestations: Secondary | ICD-10-CM

## 2011-04-22 DIAGNOSIS — Z2089 Contact with and (suspected) exposure to other communicable diseases: Secondary | ICD-10-CM

## 2011-04-22 MED ORDER — DARUNAVIR ETHANOLATE 400 MG PO TABS: 400.0000 mg | ORAL_TABLET | Freq: Two times a day (BID) | ORAL | Status: DC

## 2011-04-22 MED ORDER — RITONAVIR 100 MG PO TABS: 100.0000 mg | ORAL_TABLET | Freq: Every day | ORAL | Status: DC

## 2011-04-22 MED ORDER — VALACYCLOVIR HCL 1 G PO TABS: 1000.0000 mg | ORAL_TABLET | Freq: Every day | ORAL | Status: DC

## 2011-04-22 MED ORDER — EMTRICITABINE-TENOFOVIR DF 200-300 MG PO TABS: 1.0000 | ORAL_TABLET | Freq: Every day | ORAL | Status: DC

## 2011-04-26 ENCOUNTER — Other Ambulatory Visit (INDEPENDENT_AMBULATORY_CARE_PROVIDER_SITE_OTHER): Payer: Medicare Other | Admitting: *Deleted

## 2011-04-26 DIAGNOSIS — Z2089 Contact with and (suspected) exposure to other communicable diseases: Secondary | ICD-10-CM

## 2011-04-26 DIAGNOSIS — Z113 Encounter for screening for infections with a predominantly sexual mode of transmission: Secondary | ICD-10-CM

## 2011-04-26 DIAGNOSIS — Z23 Encounter for immunization: Secondary | ICD-10-CM

## 2011-04-26 DIAGNOSIS — R51 Headache: Secondary | ICD-10-CM

## 2011-04-26 DIAGNOSIS — R21 Rash and other nonspecific skin eruption: Secondary | ICD-10-CM

## 2011-04-26 DIAGNOSIS — N951 Menopausal and female climacteric states: Secondary | ICD-10-CM

## 2011-04-26 DIAGNOSIS — Z207 Contact with and (suspected) exposure to pediculosis, acariasis and other infestations: Secondary | ICD-10-CM

## 2011-04-26 DIAGNOSIS — B009 Herpesviral infection, unspecified: Secondary | ICD-10-CM

## 2011-04-26 DIAGNOSIS — R61 Generalized hyperhidrosis: Secondary | ICD-10-CM

## 2011-04-26 DIAGNOSIS — B20 Human immunodeficiency virus [HIV] disease: Secondary | ICD-10-CM

## 2011-04-26 MED ORDER — DARUNAVIR ETHANOLATE 400 MG PO TABS: 400.0000 mg | ORAL_TABLET | Freq: Two times a day (BID) | ORAL | Status: DC

## 2011-04-26 MED ORDER — EMTRICITABINE-TENOFOVIR DF 200-300 MG PO TABS: 1.0000 | ORAL_TABLET | Freq: Every day | ORAL | Status: DC

## 2011-04-26 MED ORDER — VALACYCLOVIR HCL 1 G PO TABS: 1000.0000 mg | ORAL_TABLET | Freq: Every day | ORAL | Status: DC

## 2011-04-26 MED ORDER — RITONAVIR 100 MG PO TABS: 100.0000 mg | ORAL_TABLET | Freq: Every day | ORAL | Status: DC

## 2011-05-13 ENCOUNTER — Other Ambulatory Visit: Payer: Self-pay | Admitting: *Deleted

## 2011-05-13 DIAGNOSIS — B20 Human immunodeficiency virus [HIV] disease: Secondary | ICD-10-CM

## 2011-05-13 DIAGNOSIS — Z207 Contact with and (suspected) exposure to pediculosis, acariasis and other infestations: Secondary | ICD-10-CM

## 2011-05-13 DIAGNOSIS — Z23 Encounter for immunization: Secondary | ICD-10-CM

## 2011-05-13 DIAGNOSIS — N951 Menopausal and female climacteric states: Secondary | ICD-10-CM

## 2011-05-13 DIAGNOSIS — Z2089 Contact with and (suspected) exposure to other communicable diseases: Secondary | ICD-10-CM

## 2011-05-13 DIAGNOSIS — R61 Generalized hyperhidrosis: Secondary | ICD-10-CM

## 2011-05-13 DIAGNOSIS — R21 Rash and other nonspecific skin eruption: Secondary | ICD-10-CM

## 2011-05-13 DIAGNOSIS — Z113 Encounter for screening for infections with a predominantly sexual mode of transmission: Secondary | ICD-10-CM

## 2011-05-13 DIAGNOSIS — R51 Headache: Secondary | ICD-10-CM

## 2011-05-13 MED ORDER — HYDROCODONE-ACETAMINOPHEN 5-500 MG PO TABS: 1.0000 | ORAL_TABLET | Freq: Two times a day (BID) | ORAL | Status: DC | PRN

## 2011-05-13 NOTE — Telephone Encounter (Signed)
States she is out of her pain med. Last filled #30 on 04/19/11. She takes 2 a day & is out. Stated if med is for 2 a day, she should have been given #60. Asked for a refill based on 2 per day. I will chek with NP as her md is not here & will call her [phone]

## 2011-05-13 NOTE — Telephone Encounter (Signed)
NP approved & signed rx for #30 with a pain contract. I lm for pt to come pick it up

## 2011-07-08 ENCOUNTER — Other Ambulatory Visit: Payer: Self-pay | Admitting: Licensed Clinical Social Worker

## 2011-07-08 DIAGNOSIS — R21 Rash and other nonspecific skin eruption: Secondary | ICD-10-CM

## 2011-07-08 DIAGNOSIS — B20 Human immunodeficiency virus [HIV] disease: Secondary | ICD-10-CM

## 2011-07-08 DIAGNOSIS — Z113 Encounter for screening for infections with a predominantly sexual mode of transmission: Secondary | ICD-10-CM

## 2011-07-08 DIAGNOSIS — N951 Menopausal and female climacteric states: Secondary | ICD-10-CM

## 2011-07-08 DIAGNOSIS — R51 Headache: Secondary | ICD-10-CM

## 2011-07-08 DIAGNOSIS — R61 Generalized hyperhidrosis: Secondary | ICD-10-CM

## 2011-07-08 DIAGNOSIS — Z23 Encounter for immunization: Secondary | ICD-10-CM

## 2011-07-08 DIAGNOSIS — Z2089 Contact with and (suspected) exposure to other communicable diseases: Secondary | ICD-10-CM

## 2011-07-08 DIAGNOSIS — Z207 Contact with and (suspected) exposure to pediculosis, acariasis and other infestations: Secondary | ICD-10-CM

## 2011-07-08 MED ORDER — HYDROCODONE-ACETAMINOPHEN 5-500 MG PO TABS: 1.0000 | ORAL_TABLET | Freq: Two times a day (BID) | ORAL | Status: DC | PRN

## 2011-07-29 LAB — T-HELPER CELL (CD4) - (RCID CLINIC ONLY): CD4 T Cell Abs: 80 — ABNORMAL LOW

## 2011-07-30 LAB — T-HELPER CELL (CD4) - (RCID CLINIC ONLY): CD4 % Helper T Cell: 4 — ABNORMAL LOW

## 2011-10-14 ENCOUNTER — Telehealth: Payer: Self-pay | Admitting: Licensed Clinical Social Worker

## 2011-10-14 DIAGNOSIS — B20 Human immunodeficiency virus [HIV] disease: Secondary | ICD-10-CM

## 2011-10-14 DIAGNOSIS — R61 Generalized hyperhidrosis: Secondary | ICD-10-CM

## 2011-10-14 DIAGNOSIS — Z23 Encounter for immunization: Secondary | ICD-10-CM

## 2011-10-14 DIAGNOSIS — Z113 Encounter for screening for infections with a predominantly sexual mode of transmission: Secondary | ICD-10-CM

## 2011-10-14 DIAGNOSIS — R21 Rash and other nonspecific skin eruption: Secondary | ICD-10-CM

## 2011-10-14 DIAGNOSIS — Z207 Contact with and (suspected) exposure to pediculosis, acariasis and other infestations: Secondary | ICD-10-CM

## 2011-10-14 DIAGNOSIS — R51 Headache: Secondary | ICD-10-CM

## 2011-10-14 DIAGNOSIS — Z2089 Contact with and (suspected) exposure to other communicable diseases: Secondary | ICD-10-CM

## 2011-10-14 DIAGNOSIS — N951 Menopausal and female climacteric states: Secondary | ICD-10-CM

## 2011-10-14 MED ORDER — HYDROCODONE-ACETAMINOPHEN 5-500 MG PO TABS: 1.0000 | ORAL_TABLET | Freq: Two times a day (BID) | ORAL | Status: DC | PRN

## 2011-10-14 NOTE — Telephone Encounter (Signed)
That's fine

## 2011-10-14 NOTE — Telephone Encounter (Signed)
Can I refill this patient's rx for vicodin. Her reason for pain is headaches.  [facility] [place]

## 2011-10-28 ENCOUNTER — Telehealth: Payer: Self-pay | Admitting: *Deleted

## 2011-10-28 NOTE — Telephone Encounter (Signed)
Patient needs to schedule an appointment

## 2011-11-25 ENCOUNTER — Telehealth: Payer: Self-pay | Admitting: *Deleted

## 2011-11-25 ENCOUNTER — Encounter: Payer: Self-pay | Admitting: *Deleted

## 2011-11-25 NOTE — Telephone Encounter (Signed)
Needs MD, lab work and PAP smear appts.  Message left

## 2011-12-20 ENCOUNTER — Telehealth: Payer: Self-pay | Admitting: *Deleted

## 2011-12-20 ENCOUNTER — Ambulatory Visit: Payer: Medicare Other | Admitting: Adult Health

## 2011-12-20 NOTE — Telephone Encounter (Signed)
She called crying. States she has shingles back again. Located beneath breast & around to back (r). She can come now. Asked Mary at front to put her with the NP at 3:15. States she has nothing for the pain  Either rx or OTC. Crying heavily. Has someone to bring her in

## 2011-12-21 ENCOUNTER — Inpatient Hospital Stay (HOSPITAL_COMMUNITY)
Admission: AD | Admit: 2011-12-21 | Discharge: 2011-12-28 | DRG: 976 | Disposition: A | Discharge status: Home / Self Care | Payer: Medicare Other | Source: Ambulatory Visit | Attending: Internal Medicine | Admitting: Internal Medicine

## 2011-12-21 ENCOUNTER — Encounter (HOSPITAL_COMMUNITY): Payer: Self-pay | Admitting: Internal Medicine

## 2011-12-21 ENCOUNTER — Ambulatory Visit (INDEPENDENT_AMBULATORY_CARE_PROVIDER_SITE_OTHER): Payer: Medicare Other | Admitting: Adult Health

## 2011-12-21 ENCOUNTER — Ambulatory Visit: Payer: Medicare Other | Admitting: Adult Health

## 2011-12-21 DIAGNOSIS — B003 Herpesviral meningitis: Secondary | ICD-10-CM | POA: Diagnosis present

## 2011-12-21 DIAGNOSIS — K5900 Constipation, unspecified: Secondary | ICD-10-CM | POA: Diagnosis not present

## 2011-12-21 DIAGNOSIS — B029 Zoster without complications: Secondary | ICD-10-CM | POA: Insufficient documentation

## 2011-12-21 DIAGNOSIS — R519 Headache, unspecified: Secondary | ICD-10-CM | POA: Diagnosis present

## 2011-12-21 DIAGNOSIS — Z87891 Personal history of nicotine dependence: Secondary | ICD-10-CM

## 2011-12-21 DIAGNOSIS — B20 Human immunodeficiency virus [HIV] disease: Secondary | ICD-10-CM

## 2011-12-21 DIAGNOSIS — R51 Headache: Secondary | ICD-10-CM | POA: Diagnosis present

## 2011-12-21 DIAGNOSIS — Z23 Encounter for immunization: Secondary | ICD-10-CM

## 2011-12-21 DIAGNOSIS — B009 Herpesviral infection, unspecified: Secondary | ICD-10-CM | POA: Diagnosis present

## 2011-12-21 DIAGNOSIS — A878 Other viral meningitis: Secondary | ICD-10-CM

## 2011-12-21 HISTORY — DX: Human immunodeficiency virus (HIV) disease: B20

## 2011-12-21 HISTORY — DX: Asymptomatic human immunodeficiency virus (hiv) infection status: Z21

## 2011-12-21 HISTORY — DX: Zoster without complications: B02.9

## 2011-12-21 LAB — CBC
MCH: 27.9 pg (ref 26.0–34.0)
MCHC: 33.3 g/dL (ref 30.0–36.0)
MCV: 83.6 fL (ref 78.0–100.0)
Platelets: 264 10*3/uL (ref 150–400)
RDW: 12.5 % (ref 11.5–15.5)
WBC: 7.7 10*3/uL (ref 4.0–10.5)

## 2011-12-21 LAB — DIFFERENTIAL
Basophils Absolute: 0 10*3/uL (ref 0.0–0.1)
Basophils Relative: 1 % (ref 0–1)
Eosinophils Absolute: 0.6 10*3/uL (ref 0.0–0.7)
Eosinophils Relative: 8 % — ABNORMAL HIGH (ref 0–5)

## 2011-12-21 LAB — COMPREHENSIVE METABOLIC PANEL
AST: 57 U/L — ABNORMAL HIGH (ref 0–37)
Albumin: 4 g/dL (ref 3.5–5.2)
Alkaline Phosphatase: 110 U/L (ref 39–117)
BUN: 10 mg/dL (ref 6–23)
Chloride: 102 mEq/L (ref 96–112)
Potassium: 3.9 mEq/L (ref 3.5–5.1)
Total Bilirubin: 0.3 mg/dL (ref 0.3–1.2)

## 2011-12-21 MED ORDER — ONDANSETRON HCL 4 MG/2ML IJ SOLN
4.0000 mg | Freq: Three times a day (TID) | INTRAMUSCULAR | Status: DC | PRN
  Administered 2011-12-22 (×2): 4 mg via INTRAVENOUS
  Filled 2011-12-21 (×3): qty 2

## 2011-12-21 MED ORDER — SODIUM CHLORIDE 0.9 % IV SOLN
INTRAVENOUS | Status: DC
  Administered 2011-12-21 – 2011-12-24 (×6): via INTRAVENOUS

## 2011-12-21 MED ORDER — INFLUENZA VIRUS VACC SPLIT PF IM SUSP
0.5000 mL | INTRAMUSCULAR | Status: AC
  Administered 2011-12-22: 0.5 mL via INTRAMUSCULAR
  Filled 2011-12-21: qty 0.5

## 2011-12-21 MED ORDER — HYDROCODONE-ACETAMINOPHEN 5-325 MG PO TABS
1.0000 | ORAL_TABLET | ORAL | Status: DC | PRN
  Administered 2011-12-21 – 2011-12-22 (×3): 2 via ORAL
  Filled 2011-12-21 (×3): qty 2

## 2011-12-21 MED ORDER — ENOXAPARIN SODIUM 40 MG/0.4ML ~~LOC~~ SOLN
40.0000 mg | SUBCUTANEOUS | Status: DC
  Administered 2011-12-21 – 2011-12-27 (×6): 40 mg via SUBCUTANEOUS
  Filled 2011-12-21 (×8): qty 0.4

## 2011-12-21 MED ORDER — DEXTROSE 5 % IV SOLN
600.0000 mg | Freq: Three times a day (TID) | INTRAVENOUS | Status: DC
  Administered 2011-12-21 – 2011-12-22 (×2): 600 mg via INTRAVENOUS
  Filled 2011-12-21 (×4): qty 12

## 2011-12-21 MED ORDER — PNEUMOCOCCAL VAC POLYVALENT 25 MCG/0.5ML IJ INJ
0.5000 mL | INJECTION | INTRAMUSCULAR | Status: AC
  Administered 2011-12-22: 0.5 mL via INTRAMUSCULAR
  Filled 2011-12-21: qty 0.5

## 2011-12-21 MED ORDER — HYDROCERIN EX CREA
TOPICAL_CREAM | CUTANEOUS | Status: DC | PRN
  Administered 2011-12-21: 19:00:00 via TOPICAL
  Filled 2011-12-21 (×2): qty 113

## 2011-12-21 MED ORDER — AZITHROMYCIN 600 MG PO TABS
1200.0000 mg | ORAL_TABLET | ORAL | Status: DC
  Administered 2011-12-21: 1200 mg via ORAL
  Filled 2011-12-21 (×2): qty 2

## 2011-12-21 MED ORDER — SULFAMETHOXAZOLE-TRIMETHOPRIM 400-80 MG PO TABS
1.0000 | ORAL_TABLET | Freq: Every day | ORAL | Status: DC
  Administered 2011-12-22 – 2011-12-28 (×7): 1 via ORAL
  Filled 2011-12-21 (×8): qty 1

## 2011-12-21 NOTE — Assessment & Plan Note (Signed)
Should resume PCP prophylaxis and MAC prophylaxis. Staging labs to be performed during admission and a discussion regarding resuming HIV. Therapy should also be initiated.

## 2011-12-21 NOTE — H&P (Signed)
Hospital Admission Note Date: 12/21/2011  Patient name: [name] Medical record number: [id number] Date of birth: 1966/01/22 Age: 46 y.o. Gender: female PCP: Kelly Lav, MD, MD  Medical Service: Internal Medicine Teaching Service - [facility]  Attending physician: Dr. Drue Shields     1st Contact: [name], MS4  Pager: [phone] 2nd Contact: Dr. Lars Shields   Pager: [phone] After 5 pm or weekends: 1st Contact:      Pager: [phone] 2nd Contact:      Pager: [phone]  Chief Complaint: Shingles  History of Present Illness: [name] is a 46 year old lady with PMH significant for HIV (last CD4 28 on 01/20/11 and HIV viral load of 79,100 on 04/19/11) who presents presents from clinic with a painful rash that tracks under her right breast around to her back. She first noticed her back itching last Friday. Yesterday, she noticed a painful raised rash, that has progressively gotten worse. Patient has previously had shingles in 2000 along the left side of her chest that felt similar. She does not remember how that episode was treated. Patient says she has taken Valtrex in the past without any side effects.   Patient was diagnosed with HIV in 2005. She remembers being on "a white pill", "two orange pills", and truvada. However, she has never been compliant with her medications. Patient endorses an occasional cough productive of light green sputum. She has intermittent hot flashes, but no overt fevers. Patient endorses chronic loose stools without blood or melena. Patient also endorsing stabbing headache along the right side of her head. The headache improves when she lies down. It is not associated with photophobia or neck rigidity. Patient has lost 5 pounds since her last visit with Dr. Daiva Shields. She says her appetite has been decreased lately. Patient denies any rashes, but endorses pruritis between abdominal folds.   Past Medical History: 1. Shingles - 2000 2. HIV - since 2005  Past  Surgical History: 1. C-section - 1982 2. Tonsilectomy - age 53s.   Medications Prior to Admission: Intermittent Goody's powder for headache.  Allergies: 1. Morphine: "Felt like I was on fire". However, able to take vicodin, but not percocet.  2. Doxycycline: unknown reaction.  Social History:  Patient lives in [place], in a house with her friend She quit smoking 5 years ago, with a 20 pack year history overall. Patient used to smoke crack, but stopped with her HIV diagnosis in 2005. Patient denies current alcohol use. Patient denies current sexual intercourse. Patient says her son was shot and killed in 2011. She has been struggling with this. She says depression from this event has caused her to not take care of herself.   Family History:  Many family members with HTN and DM. Maternal grandmother with Alzheimers in Age 79s. No family history of MI, stroke, malignancy, or HIV.   Review of Systems: Pertinent items are noted in HPI.  Physical Exam: Blood pressure 153/92, pulse 83, temperature 99.4 F (37.4 C), temperature source Oral, resp. rate 20, height 5\' 6"  (1.676 m), weight 86.047 kg (189 lb 11.2 oz), SpO2 100.00%. Head: Atraumatic. Normocephalic. No herpetic lesions noted.  Eyes: Slight hyperemia along exposed sclera. No herpetic lesions. Extraocular eye movements intact. Pupils equally round and reactive to light and accomodation.  Mouth: Moist mucus membranes. No plaques noted.  Neck: Trachea midline. No thyroidmegaly. No cervical lymphadenopathy. No JVD Pulmonary: Normal respiratory expansion. Clear to auscultation bilaterally. No wheezing, rales, rhonchii. Cardiac: Regular rate and rhythm. No  murmurs, rubs, or gallops. Normal S1 and S2.  GI: Normoactive bowel sounds. Extremities: Acyanotic. No clubbing. 2+ radial and pedal pulses. Capillary refill < 2 s. No LE edema.  Derm: Round, raised, painful papules along along T5/6 distribution on the right, from under the breast,  around the back. Multiple hypopigmented escoriations in abdominal fold. Hyperpigmented patches on the lower extremities bilaterally.  Neuro: 5/5 grip and foot extension bilaterally. Toes down and in.   Lab results: WBCs 7.7, HGB 13.3, PLTs 264, Neutrophils 4.7, Lymphocytes 1.7 CMP Pending.  CD4 and HIV viral load pending.   Assessment & Plan by Problem: 1. Shingles: Patient presents with painful papules along a T5/6 dermatomal distribution. These papules are not yet vesicular, ruptured, or ulcerated. They are exquisitely tender. Patient has a history of shingles in 2000, that feels similar. Upon admission, the patient had a noted "allergy" to acyclovir. This allergy was discussed with the patient. She does not remember any adverse events to acyclovir. She does remember taking Valtrex in the past without difficulty. Patient says during a prior hospitalization she was given "several pills" on of which caused nausea and vomiting. Given the patient's ability to tolerate valtrex, will restart acyclovir, and have advised the patient to note any adverse events.  -- Hydrocodone/Acetominophen 5/325 1-2 tabs po prn for pain. -- Start Acyclovir therapy 600mg  iv q8h. Appreciate pharmacy recommendations.  -- Provide NS at 132ml/hr -- Monitor Cr  2. HIV: Patient was diagnosed with HIV in 2005, however, she may have had HIV longer as she was never tested during her shingles breakout in 2000. Her most recent CD4+ on 01/20/11 was 28. Her most recent viral load was 79,100 on 04/19/11. Patient does not take her antiretroviral therapy. Patient is followed by Dr. Daiva Shields in the ID clinic. Patient says depression from her sons passing has caused her to not take care of herself. Given low CD4 count, we will initiate prophylaxis for PCP, Toxoplasma, and MAC. Patient says she is motivated to take her medications in the future. On admission, patient's chart indicated an "allergy" to trimethoprim/sulfamethosazole. Patient does  not remember any adverse reactions to this medication. Prior notes also question this allergy, and note the reaction was nausea and vomiting. Will initiate Bactrim therapy.  -- Start Azithromycin 1200mg  po Weekly (First dose Tuesday, 12/20/10) -- Start Trimethoprim/Sulfamethoxazole 80/400 1 tab daily -- Initiate ART therapy in morning.  -- Measure CD4 and HIV viral load.   3. Headache: Patient has a chronic history of headaches. She says she has had head imaging in the past which has not revealed pathology. Currently her headaches are increasing in frequency. She denies headache or neck rigidity. Pain improves when she lies down. Patient does not have any focal neurologic deficits. Patient does not have any rashes or lesions on her scalp. Her eyes are a little irritated, but do not show any signs of herpetic lesions. Will continue to monitor.  4. 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 This is a chronic problem. Currently the skin is escoriated, but is not red or inflamed.  -- Eucerin Cream  5. VTE Prophylaxis: Lovenox  Signed: Wilmer Floor 12/21/2011, 4:54 PM   Patient was seen, examined and discussed in detail with MS4.  HPI: Patient was sent from ID clinic after they found to have Shingles breakout in her chest region. She was in excruciating pain which is the reason for admission. Patient has not been complaint with any of her  HIV meds. She has history of shingles in the past but stopped taking valcyclovir prophylaxis which was denied by her insurance in 2005.  14 point review of system was positive for headaches but otherwise negative.  Rest of the H and P as above.  BP 153/92  Pulse 83  Temp(Src) 99.4 F (37.4 C) (Oral)  Resp 20  Ht 5\' 6"  (1.676 m)  Wt 189 lb 11.2 oz (86.047 kg)  BMI 30.62 kg/m2  SpO2 100%   BP 153/92  Pulse 83  Temp(Src) 99.4 F (37.4 C) (Oral)  Resp 20  Ht 5\' 6"  (1.676 m)  Wt 189 lb 11.2 oz (86.047 kg)   BMI 30.62 kg/m2  SpO2 100%  General Appearance:    Alert, cooperative, no distress, appears stated age  Head:    Normocephalic, without obvious abnormality, atraumatic  Eyes:    PERRL, conjunctiva/corneas clear, EOM's intact, fundi    benign, both eyes  Ears:    Normal TM's and external ear canals, both ears  Nose:   Nares normal, septum midline, mucosa normal, no drainage    or sinus tenderness  Throat:   Lips, mucosa, and tongue normal; teeth and gums normal  Neck:   Supple, symmetrical, trachea midline, no adenopathy;    thyroid:  no enlargement/tenderness/nodules; no carotid   bruit or JVD  Back:     Symmetric, no curvature, ROM normal, no CVA tenderness  Lungs:     Clear to auscultation bilaterally, respirations unlabored  Chest Wall:    Extremely tender lesions over right inframammary area, scabbed, non vesicular, raised lesions.   Heart:    Regular rate and rhythm, S1 and S2 normal, no murmur, rub   or gallop     Abdomen:     Soft, non-tender, bowel sounds active all four quadrants,    no masses, no organomegaly  Genitalia:    Normal female without lesion, discharge or tenderness  Rectal:    Normal tone, normal prostate, no masses or tenderness;   guaiac negative stool  Extremities:   Extremities normal, atraumatic, no cyanosis or edema  Pulses:   2+ and symmetric all extremities  Skin:   Skin color, texture, turgor normal, no rashes or lesions  Lymph nodes:   Cervical, supraclavicular, and axillary nodes normal  Neurologic:   CNII-XII intact, normal strength, sensation and reflexes    throughout    Patient does not have any labs at this time.  A and P. 1. Shingles 2. HIV uncontrolled  We will start her on acyclovir at this time. Order prophylaxis for PCP and MAC. Order HIV labs and restart HIV meds as appropriate.   Kelly Mage MD R3 Internal Medicine Resident Pager [phone] 7:04 PM

## 2011-12-21 NOTE — Progress Notes (Signed)
ANTIBIOTIC CONSULT NOTE - INITIAL  Pharmacy Consult for Acyclovir Indication:  Herpes Zoster   Allergies  Allergen Reactions  . Morphine Other (See Comments)    Burning under the skin  . Doxycycline Hyclate Rash    Patient Measurements: Height: 5\' 6"  (167.6 cm) Weight: 189 lb 11.2 oz (86.047 kg) IBW/kg (Calculated) : 59.3 kg Dosing Weight: 59.3 kg  Vital Signs: Temp: 99.4 F (37.4 C) (02/19 1557) Temp src: Oral (02/19 1557) BP: 153/92 mmHg (02/19 1557) Pulse Rate: 83  (02/19 1557)   Labs: Estimated Creatinine Clearance: 86.3 ml/min (by C-G formula based on Cr of 0.9).  Labs from this admission pending.   Medical History: Past Medical History  Diagnosis Date  . HIV infection   . Shingles     2000   Hx Acyclovir resistance noted  Assessment:   Extensive rash noted. T4-T5 distribution.  Goal of Therapy:     Appropriate dose for renal function and infection.  Plan:     Will begin Acyclovir 600 mg IV q8hrs (~ 10 mg/kg ideal body weight/dose).     Will follow-up admission labs to confirm renal function.    Will follow-up for change to oral therapy when appropriate.  Dennie Fetters, RPh 12/21/2011,5:17 PM

## 2011-12-21 NOTE — Progress Notes (Signed)
Internal Medicine Night Float Intern: Called by nursing 8pm for patient vomiting.  Scant blood in the vomit.  Ordered Zofran Q8HPRN for nausea/vomiting relief.  Also ordered a CBC recheck for the morning.  Will recheck with nursing later tonight for update.

## 2011-12-21 NOTE — Assessment & Plan Note (Signed)
Extensively involving the T4-T5 dermatomal distribution. Both anterior and posterior presentations. Pain is significant and patient is having severe anxiety associated with the pain. Given the extent of her anxiety, and the degree of involvement in the T4, T5 distribution, as well as a documented history of acyclovir resistance, we recommend admission to the hospital for further evaluation and pain management. Report was given to Dr. Ilsa Iha and medical admitting team.

## 2011-12-21 NOTE — Progress Notes (Signed)
Subjective:  Two-day history of painful blistering rash involving right mid back, radiating to right mid chest under breasts. Has been off HIV. Therapy for "several months." Last. CD4 count was 40 with a viral load greater than 79,000 copies. Currently, she is complaining of severe pain at the area of her rash as well as fevers and headaches. Review of Systems - General ROS: positive for  - fatigue, fever, hot flashes and malaise Psychological ROS: positive for - anxiety, depression, memory difficulties and mood swings ENT ROS: negative for - dysphagia Endocrine ROS: negative Breast ROS: See history of present illness Respiratory ROS: no cough, shortness of breath, or wheezing Cardiovascular ROS: no chest pain or dyspnea on exertion Gastrointestinal ROS: no abdominal pain, change in bowel habits, or black or bloody stools Genito-Urinary ROS: no dysuria, trouble voiding, or hematuria Musculoskeletal ROS: negative Neurological ROS: no TIA or stroke symptoms Dermatological ROS: See history of present illness   Objective:  Blistering rash in a zosteriform configuration involving the T4-T5 dermatomal distribution, radiating posteriorly on the right to anterior. Under right breast.  General appearance: alert, cooperative and moderate distress Head: Normocephalic, without obvious abnormality, atraumatic Throat: lips, mucosa, and tongue normal; teeth and gums normal Resp: clear to auscultation bilaterally Breasts: See above Cardio: Tachycardia pulse Skin: See above Neurologic: Grossly normal Psych:    Markedly anxious excessively tearful.   Assessment/Plan:  HIV DISEASE Should resume PCP prophylaxis and MAC prophylaxis. Staging labs to be performed during admission and a discussion regarding resuming HIV. Therapy should also be initiated.  Herpes zoster Extensively involving the T4-T5 dermatomal distribution. Both anterior and posterior presentations. Pain is significant and patient is  having severe anxiety associated with the pain. Given the extent of her anxiety, and the degree of involvement in the T4, T5 distribution, as well as a documented history of acyclovir resistance, we recommend admission to the hospital for further evaluation and pain management. Report was given to Dr. Ilsa Iha and medical admitting team.      Malvin Johns A. Sundra Aland, MS, [facility] [phone]  12/21/2011,  3:09 PM

## 2011-12-22 ENCOUNTER — Inpatient Hospital Stay (HOSPITAL_COMMUNITY): Payer: Medicare Other

## 2011-12-22 LAB — CBC
HCT: 37.8 % (ref 36.0–46.0)
MCHC: 33.1 g/dL (ref 30.0–36.0)
MCV: 83.3 fL (ref 78.0–100.0)
RDW: 12.7 % (ref 11.5–15.5)

## 2011-12-22 LAB — T-HELPER CELLS (CD4) COUNT (NOT AT ARMC): CD4 T Cell Abs: 10 uL — ABNORMAL LOW (ref 400–2700)

## 2011-12-22 IMAGING — CR DG CHEST 2V
2 series · 2 of 2 positions shown · non-contrast
Comparison: [DATE]

CLINICAL DATA: Cough and fever

CHEST - 2 VIEW

[w chest pa]
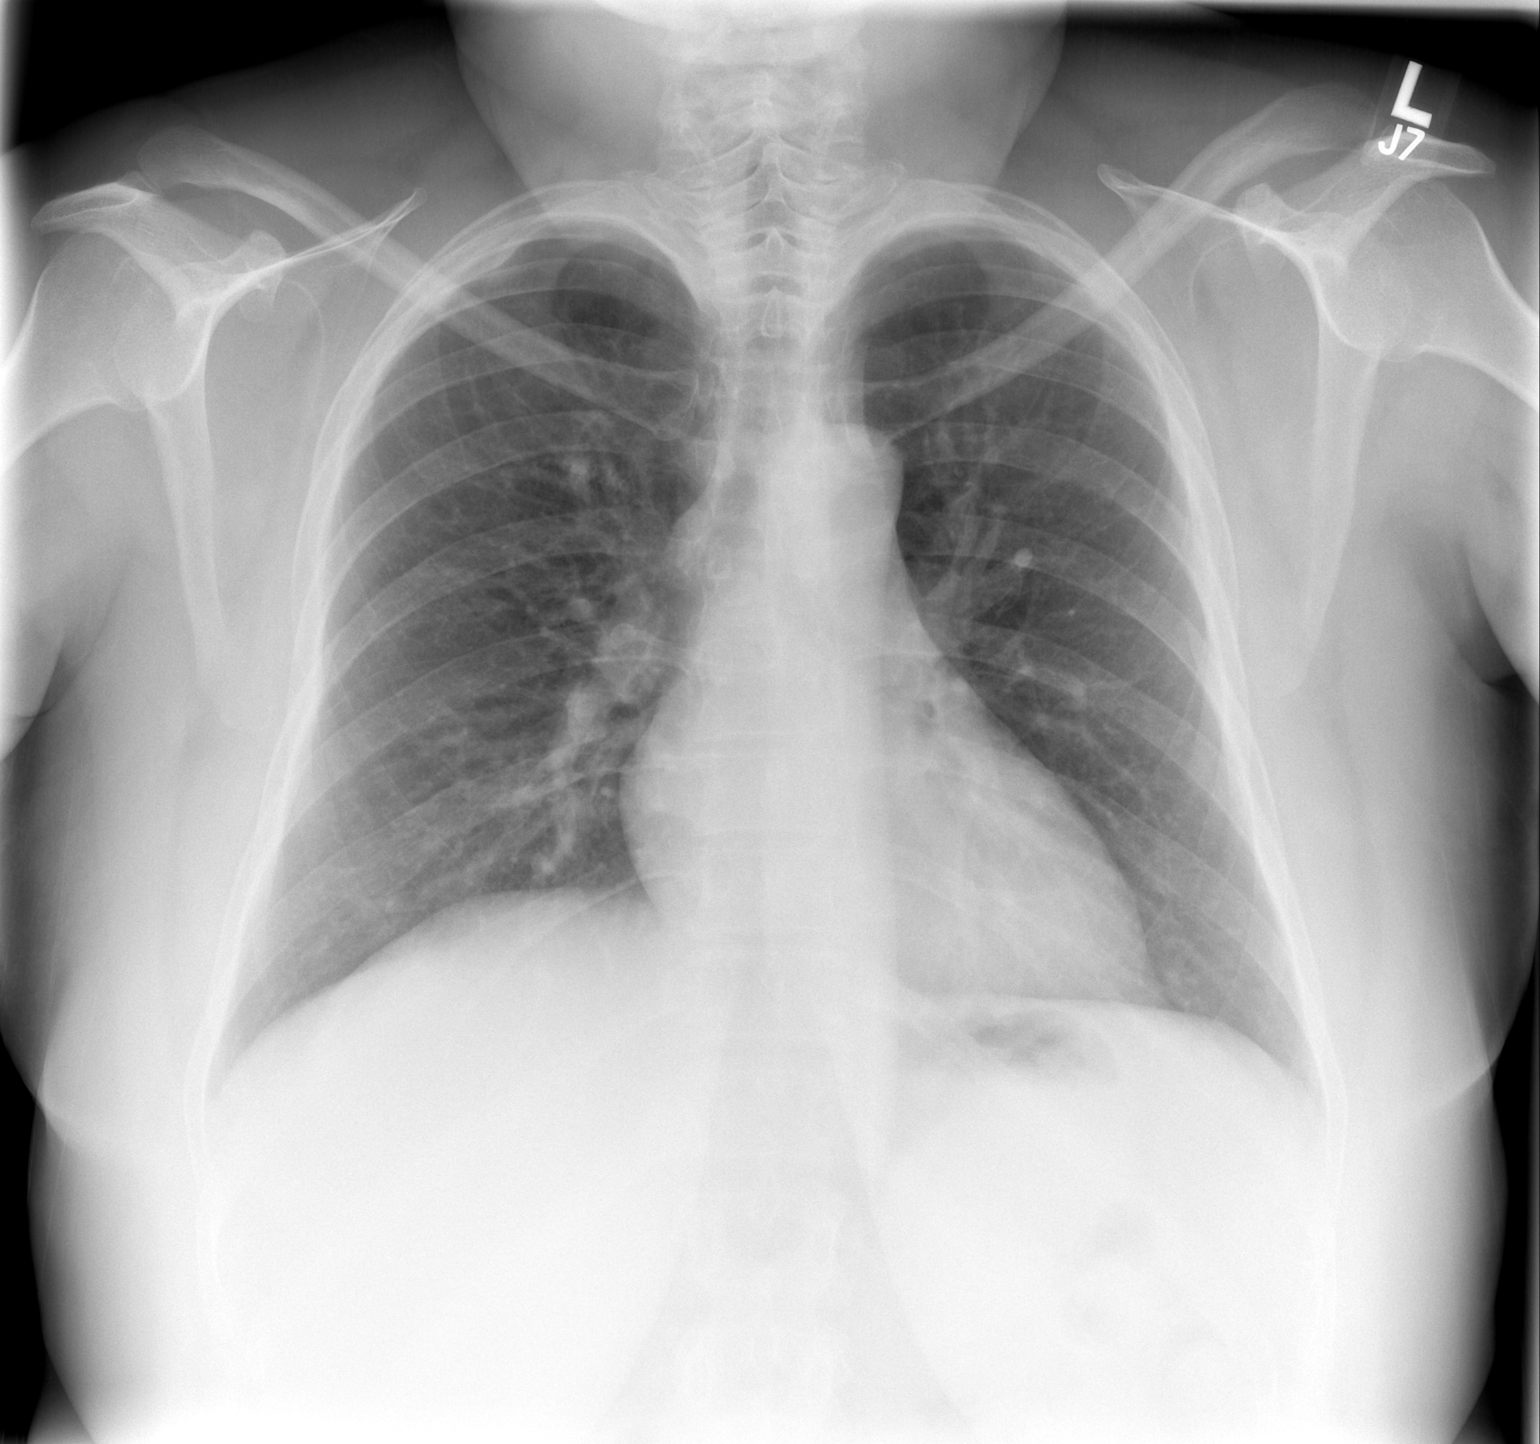

[w chest lat]
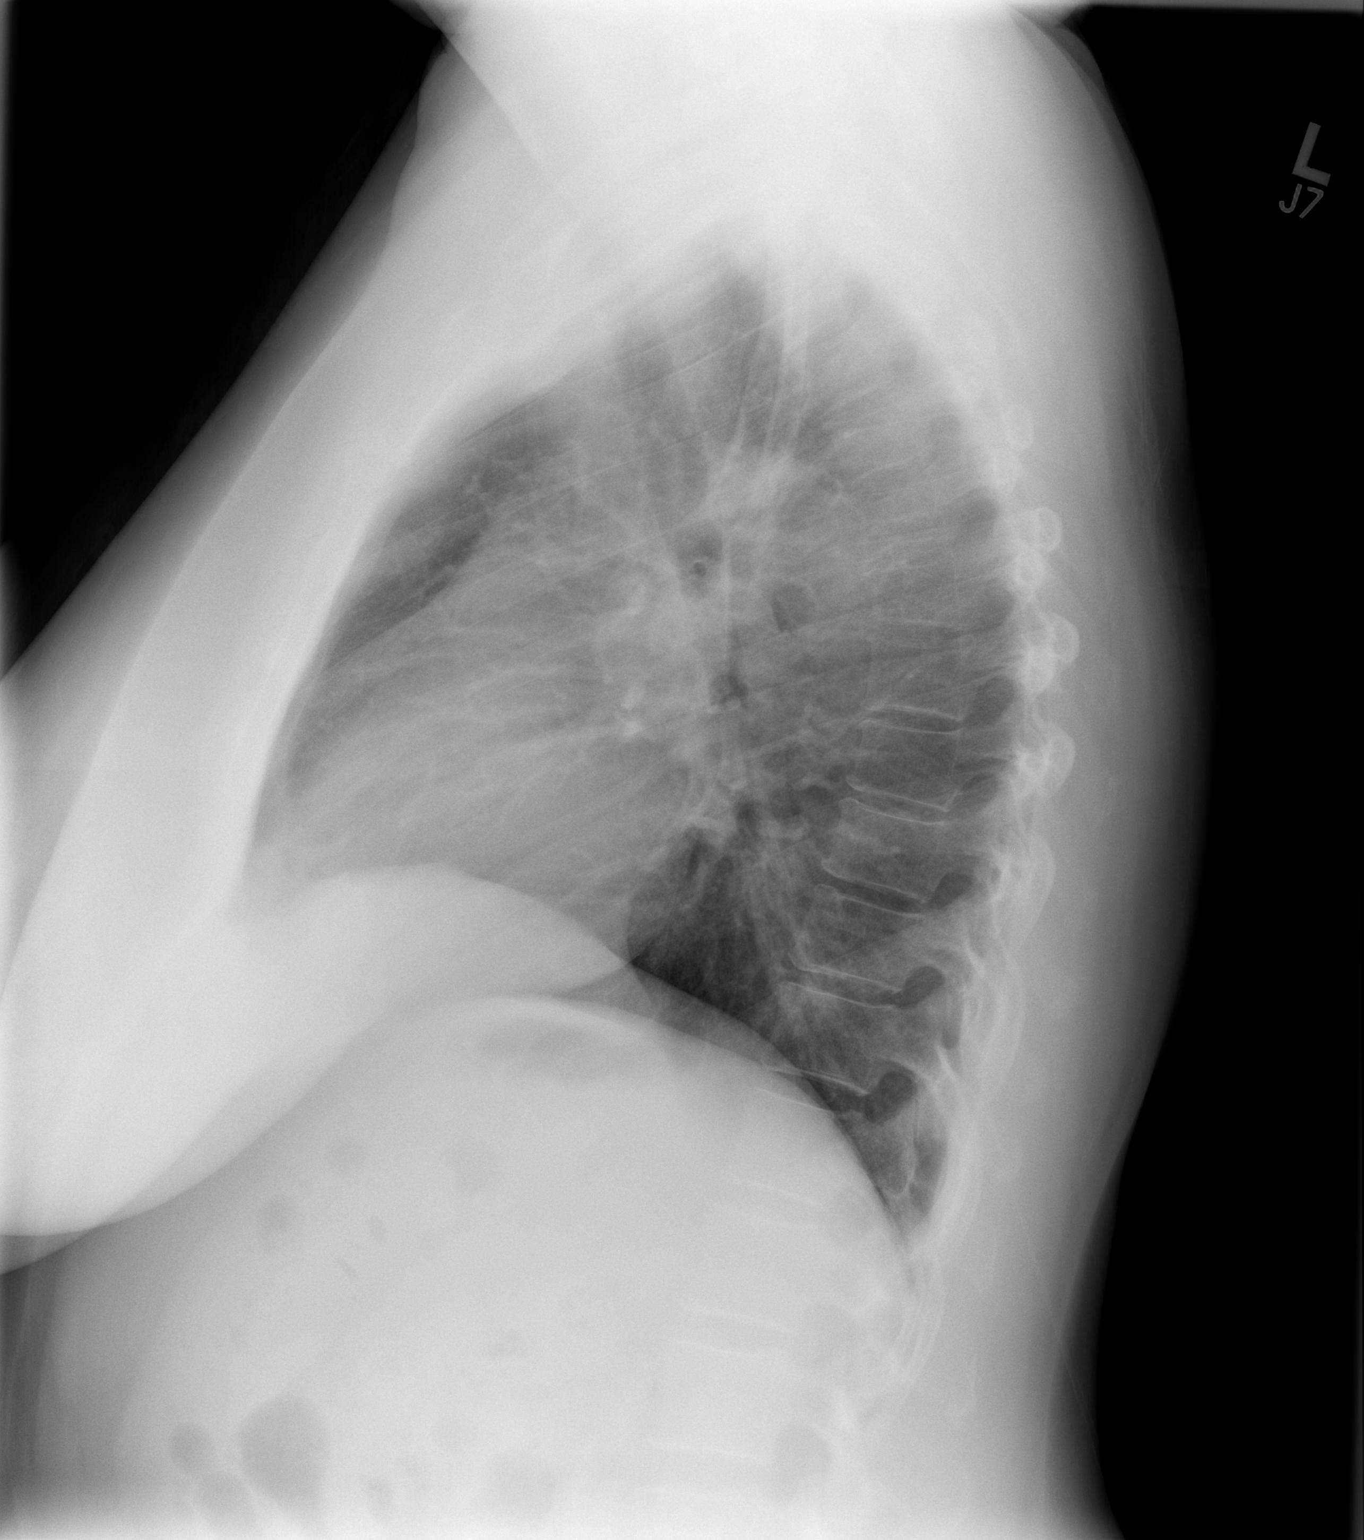

[2 of 2 positions shown; findings below may reference images not displayed]

FINDINGS: Heart size is upper normal.  Vascularity is normal.
Lungs are clear without infiltrate or effusion.  Negative for mass
lesion.  No bony abnormality in the thoracic spine.
IMPRESSION: Negative

## 2011-12-22 MED ORDER — EMTRICITABINE-TENOFOVIR DF 200-300 MG PO TABS
1.0000 | ORAL_TABLET | Freq: Every day | ORAL | Status: DC
  Administered 2011-12-22 – 2011-12-27 (×6): 1 via ORAL
  Filled 2011-12-22 (×7): qty 1

## 2011-12-22 MED ORDER — SENNOSIDES-DOCUSATE SODIUM 8.6-50 MG PO TABS
2.0000 | ORAL_TABLET | Freq: Two times a day (BID) | ORAL | Status: DC
  Administered 2011-12-22 – 2011-12-28 (×13): 2 via ORAL
  Filled 2011-12-22: qty 2
  Filled 2011-12-22: qty 1
  Filled 2011-12-22: qty 2
  Filled 2011-12-22: qty 1
  Filled 2011-12-22 (×2): qty 2
  Filled 2011-12-22: qty 1
  Filled 2011-12-22 (×2): qty 2
  Filled 2011-12-22: qty 1
  Filled 2011-12-22 (×5): qty 2

## 2011-12-22 MED ORDER — DARUNAVIR ETHANOLATE 600 MG PO TABS
600.0000 mg | ORAL_TABLET | Freq: Two times a day (BID) | ORAL | Status: DC
  Administered 2011-12-22 – 2011-12-28 (×12): 600 mg via ORAL
  Filled 2011-12-22 (×16): qty 1

## 2011-12-22 MED ORDER — EMTRICITABINE-TENOFOVIR DF 200-300 MG PO TABS
1.0000 | ORAL_TABLET | Freq: Every day | ORAL | Status: DC
  Administered 2011-12-22: 1 via ORAL
  Filled 2011-12-22: qty 1

## 2011-12-22 MED ORDER — RITONAVIR 100 MG PO TABS
100.0000 mg | ORAL_TABLET | Freq: Two times a day (BID) | ORAL | Status: DC
  Administered 2011-12-23 – 2011-12-28 (×11): 100 mg via ORAL
  Filled 2011-12-22 (×15): qty 1

## 2011-12-22 MED ORDER — GI COCKTAIL ~~LOC~~
30.0000 mL | ORAL | Status: DC | PRN
Start: 2011-12-22 — End: 2011-12-28
  Filled 2011-12-22 (×2): qty 30

## 2011-12-22 MED ORDER — PROMETHAZINE HCL 25 MG/ML IJ SOLN
12.5000 mg | Freq: Four times a day (QID) | INTRAMUSCULAR | Status: DC | PRN
  Administered 2011-12-22 – 2011-12-23 (×2): 12.5 mg via INTRAVENOUS
  Administered 2011-12-24: 25 mg via INTRAVENOUS
  Administered 2011-12-25 – 2011-12-26 (×2): 12.5 mg via INTRAVENOUS
  Administered 2011-12-26: 25 mg via INTRAVENOUS
  Administered 2011-12-27 (×2): 12.5 mg via INTRAVENOUS
  Filled 2011-12-22 (×9): qty 1

## 2011-12-22 MED ORDER — PANTOPRAZOLE SODIUM 40 MG PO TBEC
40.0000 mg | DELAYED_RELEASE_TABLET | Freq: Every day | ORAL | Status: DC
  Administered 2011-12-22 – 2011-12-28 (×7): 40 mg via ORAL
  Filled 2011-12-22 (×7): qty 1

## 2011-12-22 MED ORDER — EMTRICITABINE-TENOFOVIR DF 200-300 MG PO TABS: 1.0000 | ORAL_TABLET | Freq: Every day | ORAL | Status: DC

## 2011-12-22 MED ORDER — HYDROCODONE-ACETAMINOPHEN 5-325 MG PO TABS
1.0000 | ORAL_TABLET | ORAL | Status: DC | PRN
  Administered 2011-12-22: 2 via ORAL
  Administered 2011-12-23: 1 via ORAL
  Administered 2011-12-23: 2 via ORAL
  Administered 2011-12-23: 1 via ORAL
  Administered 2011-12-23 – 2011-12-24 (×2): 2 via ORAL
  Filled 2011-12-22: qty 2
  Filled 2011-12-22 (×2): qty 1
  Filled 2011-12-22 (×3): qty 2

## 2011-12-22 MED ORDER — HYDROCODONE-ACETAMINOPHEN 5-325 MG PO TABS: 1.0000 | ORAL_TABLET | ORAL | Status: DC | PRN

## 2011-12-22 MED ORDER — DEXTROSE 5 % IV SOLN
600.0000 mg | Freq: Three times a day (TID) | INTRAVENOUS | Status: DC
  Administered 2011-12-22 – 2011-12-27 (×15): 600 mg via INTRAVENOUS
  Filled 2011-12-22 (×16): qty 12

## 2011-12-22 NOTE — H&P (Signed)
Internal Medicine Teaching Service Attending Note Date: 12/22/2011  Patient name: [name]  Medical record number: [id number]  Date of birth: 02/23/1966   I have seen and evaluated [name] and discussed their care with the Residency Team.  [name] is a 46yo F with HIV/AIDS, CD 4 count of 28, VL 79,1000 currently not on ART regimen, presents with rash consistent with recurrent shingles.  Physical Exam: Blood pressure 138/87, pulse 75, temperature 98 F (36.7 C), temperature source Oral, resp. rate 19, height 5\' 6"  (1.676 m), weight 189 lb 11.2 oz (86.047 kg), SpO2 99.00%. gen = a x o by 3 in NAD HEENT: AT/[place], PERRLA, EOMI, no scleral icterus, and no injected sclera Neck = suplle no lad, no jvd, no thyroimegaly Pulm= CTAB, no w/c/r Cards= nl s1,s2, no g/m/r Abd= NTND, BS+ , no HSM Ext= no c/c/e Skin= >50 small sub centimeter papules, non-vesicular wrapping around back and underneath right breast, equisitily tender  Lab results: Results for orders placed during the hospital encounter of 12/21/11 (from the past 24 hour(s))  CBC     Status: Normal   Collection Time   12/22/11  5:23 AM      Component Value Range   WBC 5.8  4.0 - 10.5 (K/uL)   RBC 4.54  3.87 - 5.11 (MIL/uL)   Hemoglobin 12.5  12.0 - 15.0 (g/dL)   HCT 29.5  28.4 - 13.2 (%)   MCV 83.3  78.0 - 100.0 (fL)   MCH 27.5  26.0 - 34.0 (pg)   MCHC 33.1  30.0 - 36.0 (g/dL)   RDW 44.0  10.2 - 72.5 (%)   Platelets 263  150 - 400 (K/uL)    Imaging results:  Dg Chest 2 View  12/22/2011  *RADIOLOGY REPORT*  Clinical Data: Cough and fever  CHEST - 2 VIEW  Comparison: 09/24/2004  Findings: Heart size is upper normal.  Vascularity is normal. Lungs are clear without infiltrate or effusion.  Negative for mass lesion.  No bony abnormality in the thoracic spine.  IMPRESSION: Negative  Original Report Authenticated By: Camelia Phenes, M.D.    Assessment and Plan: I agree with the formulated Assessment and Plan with  the following changes: will continue with acyclovir and pain medications to treat shingles. Will plan to re-initiate ART regimen of truvada, boosted darunavir if patient is willing to be adherent and start back on therapy.

## 2011-12-22 NOTE — Progress Notes (Addendum)
Subjective: Patient's chief complaint is headache. She says headache is bandlike across the middle of her forehead. She denies photophobia, neck stiffness, vision changes, muscle weakness, fever, or chills. Patient says her shingles is sensitive but manageable with the current pain medications. Patient had some nausea with acyclovir last night. She had moderate improvement with Zofran. She was able to eat apple sauce in the morning, but more solid foods make her nauseous. She has vomited twice, once which was possible blood tinged.   Objective: Vital signs in last 24 hours: Filed Vitals:   12/21/11 1557 12/22/11 0617  BP: 153/92 147/92  Pulse: 83 73  Temp: 99.4 F (37.4 C) 98.7 F (37.1 C)  TempSrc: Oral   Resp: 20 20  Height: 5\' 6"  (1.676 m)   Weight: 86.047 kg (189 lb 11.2 oz)   SpO2: 100% 96%   Physical Exam: Head: Atraumatic. Normocephalic. No herpetic lesions noted. Non-tender to palpation. No frontal or maxillary sinus tenderness. No tooth tenderness. No rashes present. Eyes: Slight hyperemia along exposed sclera. No herpetic lesions. Extraocular eye movements intact. Pupils equally round and reactive to light and accomodation.  Mouth: Moist mucus membranes. No plaques noted.  Neck: Trachea midline. No thyroidmegaly. No cervical lymphadenopathy. No JVD  Pulmonary: Normal respiratory expansion. Clear to auscultation bilaterally. No wheezing, rales, rhonchii.  Cardiac: Regular rate and rhythm. No murmurs, rubs, or gallops. Normal S1 and S2.  GI: Normoactive bowel sounds.  Extremities: Acyanotic. No clubbing. 2+ radial and pedal pulses. Capillary refill < 2 s. No LE edema.  Derm: Round, raised, painful papules/vesicles along T5/6 distribution on the right, from under the breast, around the back. > 50 lesions.  Multiple hypopigmented escoriations in abdominal fold. Hyperpigmented patches on the lower extremities bilaterally.  Neuro: 5/5 grip and foot extension bilaterally. Normal  sensation to fine touch of hands and feet bilaterally. Toes down and in.   Lab Results: Na 137, K 3.9, Cl 102, CO2 26, BUN 10, Cr 1.02, Glucose 96, Ca 9.7, Alk Phose 110, Albumin 4.0, AST 57, ALT 86, Total protein 7.7, Tbili 0.3 WBCs 5.8, HGB 13.3 --> 12.5, PLTs 263, ANC 4.7, ALC 1.7  Studies/Results: Dg Chest 2 View (12/22/2011)  *RADIOLOGY REPORT*  Clinical Data: Cough and fever  CHEST - 2 VIEW  Comparison: 09/24/2004  Findings: Heart size is upper normal.  Vascularity is normal. Lungs are clear without infiltrate or effusion.  Negative for mass lesion.  No bony abnormality in the thoracic spine.  IMPRESSION: Negative  Original Report Authenticated By: Camelia Phenes, M.D.   Medications: I have reviewed the patient's current medications. Scheduled Meds:   . acyclovir  600 mg Intravenous Q8H  . azithromycin  1,200 mg Oral Weekly  . enoxaparin  40 mg Subcutaneous Q24H  . influenza  inactive virus vaccine  0.5 mL Intramuscular Tomorrow-1000  . pneumococcal 23 valent vaccine  0.5 mL Intramuscular Tomorrow-1000  . senna-docusate  2 tablet Oral BID  . sulfamethoxazole-trimethoprim  1 tablet Oral Daily  . DISCONTD: acyclovir  600 mg Intravenous Q8H   Continuous Infusions:   . sodium chloride 150 mL/hr at 12/22/11 1047   PRN Meds:.hydrocerin, HYDROcodone-acetaminophen, ondansetron (ZOFRAN) IV, DISCONTD: HYDROcodone-acetaminophen, DISCONTD: HYDROcodone-acetaminophen Assessment/Plan: 1. Shingles: Patient presents with painful papules along a T5/6 dermatomal distribution. Some of the papules look more vesicular than yesterday. No ulcerations noted. > 50 lesions. Watching patient for disseminated disease. Patient does complain of occasional cough, but CXR is negative for airspace disease. Patient has slightly elevated LFTs, but negative  Hep A/B/C panel on 12/2006.   -- Increase to Hydrocodone/Acetominophen 5/325 1-2 tabs q4h po prn for pain.  -- Maintain Acyclovir therapy 600mg  iv q8h. Appreciate  pharmacy recommendations. -- Premedicate Acyclovir therapy with Zofran.   -- Provide NS at 164ml/hr  -- Monitor Cr  -- Monitor LFTs  2. HIV: Patient was diagnosed with HIV in 2005, however, she may have had HIV longer as she was never tested during her shingles breakout in 2000. Her most recent CD4+ on 01/20/11 was 28. Her most recent viral load was 79,100 on 04/19/11. Patient does not take her antiretroviral therapy. Dr. Daiva Eves has previously attempted Truvada, Prezista, Norvir therapy, which we will resume. Given low CD4 count, we will initiate prophylaxis for PCP, Toxoplasma, and MAC -- Maintain Azithromycin 1200mg  po Weekly (First dose Tuesday, 12/20/10)  -- Maintain Trimethoprim/Sulfamethoxazole 80/400 1 tab daily  -- CD4 and HIV viral load pending.  -- ART therapy today.   3. Headache: Patient has a chronic history of headaches. She says she has had head imaging in the past which has not revealed pathology. Currently her headaches are increasing in frequency. She denies headache or neck rigidity. Pain improves when she lies down. Patient does not have any focal neurologic deficits. Patient does not have any rashes or lesions on her scalp. Her eyes are a little irritated, but do not show any signs of herpetic lesions. Will continue to monitor. Patient does not have any focal neurologic deficits.  -- Increase to Hydrocodone/Acetominophen 5/325 1-2 tabs q4h po prn for pain.   4. 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 This is a chronic problem. Currently the skin is escoriated, but is not red or inflamed.  -- Eucerin Cream   5. VTE Prophylaxis: Lovenox   LOS: 1 day   Wilmer Floor 12/22/2011, 11:31 AM  .addendum   Patient was seen examined and discussed with Wylene Men MS$.  Subjective: patient is doing much better as compared to day of admission. She still has pain and taking pain medications every 8 hours although she has them every 4  hours as needed. We discussed that she could ask for pain medications more frequently. Also complains of constipation.  BP 153/89  Pulse 82  Temp(Src) 97.7 F (36.5 C) (Oral)  Resp 18  Ht 5\' 6"  (1.676 m)  Wt 189 lb 11.2 oz (86.047 kg)  BMI 30.62 kg/m2  SpO2 95%  Head: Atraumatic. Normocephalic. No herpetic lesions noted.  Eyes: Slight hyperemia along exposed sclera. No herpetic lesions. Extraocular eye movements intact. Pupils equally round and reactive to light and accomodation.  Mouth: Moist mucus membranes. No plaques noted.  Neck: Trachea midline. No thyroidmegaly. No cervical lymphadenopathy. No JVD  Pulmonary: Normal respiratory expansion. Clear to auscultation bilaterally. No wheezing, rales, rhonchii.  Cardiac: Regular rate and rhythm. No murmurs, rubs, or gallops. Normal S1 and S2.  GI: Normoactive bowel sounds.  Extremities: Acyanotic. No clubbing. 2+ radial and pedal pulses. Capillary refill < 2 s. No LE edema.  Derm: Round, raised, painful papules along along T5/6 distribution on the right, from under the breast, around the back. Multiple hypopigmented escoriations in abdominal fold. Hyperpigmented patches on the lower extremities bilaterally.  Neuro: 5/5 grip and foot extension bilaterally. Toes down and in.   Images and labs reviewed.  CXR does not show any abnormalities:  A and P:  1. 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 Headache 4. Constipation  We have restarted HIV meds  and OI prophylaxis meds at this time. Continue to treat shingles with acyclovir and pain meds. Add sennakot-s today to med regimen for constipation.  Lars Mage MD [facility] Internal [facility] Resident Pager [phone] 8:23 AM

## 2011-12-22 NOTE — Progress Notes (Signed)
INITIAL ADULT NUTRITION ASSESSMENT Date: 12/22/2011   Time: 9:50 AM Reason for Assessment: Consult  ASSESSMENT: Female 46 y.o.  Dx: Shingles, HIV uncontrolled  Hx:  Past Medical History  Diagnosis Date  . HIV infection   . Shingles     2000    Related Meds:     . acyclovir  600 mg Intravenous Q8H  . azithromycin  1,200 mg Oral Weekly  . enoxaparin  40 mg Subcutaneous Q24H  . influenza  inactive virus vaccine  0.5 mL Intramuscular Tomorrow-1000  . pneumococcal 23 valent vaccine  0.5 mL Intramuscular Tomorrow-1000  . sulfamethoxazole-trimethoprim  1 tablet Oral Daily     Ht: 5\' 6"  (167.6 cm)  Wt: 189 lb 11.2 oz (86.047 kg)  Ideal Wt: 59.3 kg  % Ideal Wt: 145%  Usual Wt: 193 lbs ( 87.7 kg) per patient report Wt Readings from Last 3 Encounters:  12/21/11 189 lb 11.2 oz (86.047 kg)  12/21/11 188 lb 12 oz (85.616 kg)  04/19/11 191 lb (86.637 kg)    % Usual Wt: 98%  Body mass index is 30.62 kg/(m^2). Obesity unspecified  Food/Nutrition Related Hx: patient has 2 day hx of N/V, reports weight loss in that time. Some difficulty with swallowing related to sore throat. Weight/appetite was stable prior to recent N/V.   Labs:  CMP     Component Value Date/Time   NA 137 12/21/2011 1723   K 3.9 12/21/2011 1723   CL 102 12/21/2011 1723   CO2 26 12/21/2011 1723   GLUCOSE 96 12/21/2011 1723   BUN 10 12/21/2011 1723   CREATININE 1.02 12/21/2011 1723   CREATININE 0.90 04/19/2011 1108   CALCIUM 9.7 12/21/2011 1723   PROT 7.7 12/21/2011 1723   ALBUMIN 4.0 12/21/2011 1723   AST 57* 12/21/2011 1723   ALT 86* 12/21/2011 1723   ALKPHOS 110 12/21/2011 1723   BILITOT 0.3 12/21/2011 1723   GFRNONAA 65* 12/21/2011 1723   GFRAA 75* 12/21/2011 1723    Intake/Output Summary (Last 24 hours) at 12/22/11 0954 Last data filed at 12/22/11 0450  Gross per 24 hour  Intake   1135 ml  Output      0 ml  Net   1135 ml     Diet Order: General  Supplements/Tube Feeding: none  IVF:    sodium  chloride Last Rate: 150 mL/hr at 12/22/11 0450    Estimated Nutritional Needs:   Kcal: 1950-2150  Protein:95-105 gm Fluid: > 2 L  Patient with 4 lb weight loss (2%) in 2 days. This is likely related to fluid loss from emesis.  Patient is obese and some weight loss is desired for this patient. Per reports patient is non-compliant with her HIV medications and uses marijuana to increase appetite.   NUTRITION DIAGNOSIS: -Predicted suboptimal energy intake (NI-1.6).  Status: Ongoing  RELATED TO: N/V, sore throat  AS EVIDENCE BY: patient only able to tolerate a few bites of current meal tray.   MONITORING/EVALUATION(Goals): Goal: PO intake will meet >90% of estimated nutrition needs Monitor: PO intake, weight, labs, I/O's  EDUCATION NEEDS: -No education needs identified at this time  INTERVENTION: 1. Encourage PO intake as able, order appropriate foods from back of the menu for best tolerance.  2. RD will follow  Dietitian [phone]  DOCUMENTATION CODES Per approved criteria  -Obesity Unspecified    Clarene Duke MARIE 12/22/2011, 9:50 AM

## 2011-12-23 ENCOUNTER — Inpatient Hospital Stay (HOSPITAL_COMMUNITY): Payer: Medicare Other

## 2011-12-23 ENCOUNTER — Telehealth: Payer: Self-pay | Admitting: *Deleted

## 2011-12-23 LAB — COMPREHENSIVE METABOLIC PANEL
Albumin: 3.3 g/dL — ABNORMAL LOW (ref 3.5–5.2)
Alkaline Phosphatase: 83 U/L (ref 39–117)
BUN: 6 mg/dL (ref 6–23)
Calcium: 8.8 mg/dL (ref 8.4–10.5)
Potassium: 3.6 mEq/L (ref 3.5–5.1)
Sodium: 140 mEq/L (ref 135–145)
Total Protein: 6.8 g/dL (ref 6.0–8.3)

## 2011-12-23 IMAGING — CT CT HEAD WO/W CM
1 of 3 series · 10 of 30 positions shown, 13 images · IV contrast (omnipaque)
Comparison: None.

CLINICAL DATA: Headaches.  Severely compromised AIDS patient.

CT HEAD WITHOUT AND WITH CONTRAST
TECHNIQUE: Contiguous axial images were obtained from the base of
the skull through the vertex without and with intravenous contrast.
Contrast: 80mL OMNIPAQUE IOHEXOL 300 MG/ML IV SOLN

[Series 4: head with 4.8 h37s · axial · 0.43mm/px · z∈[-112,+4]mm · 10 of 30 slices shown, 13 images]
[im 3/30  brain]
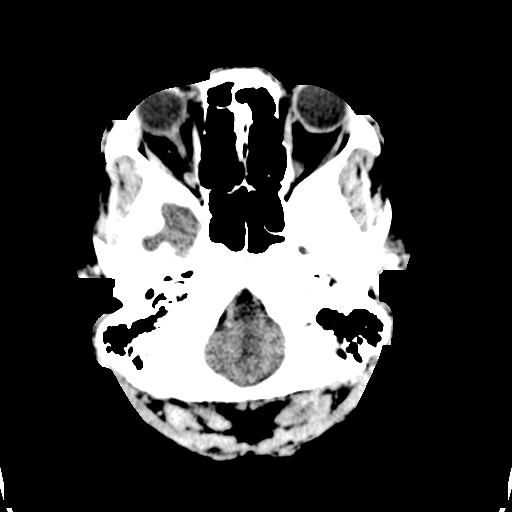
[im 3/30  bone]
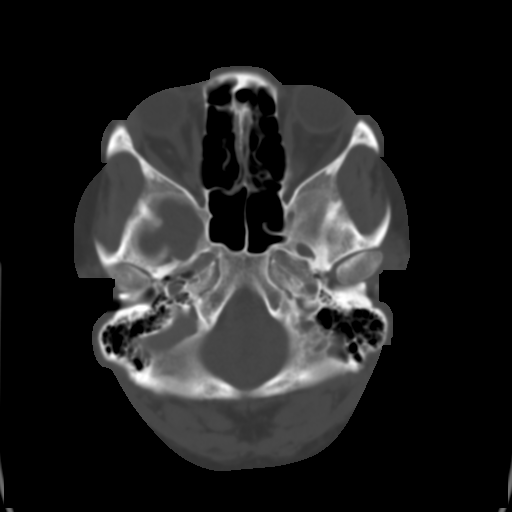
[im 6/30  brain]
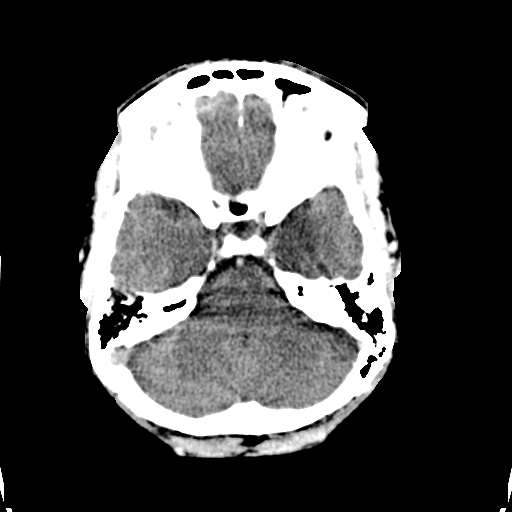
[im 8/30  brain]
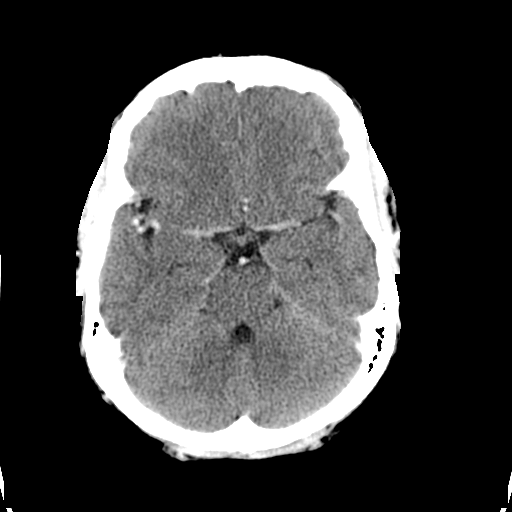
[im 11/30  brain]
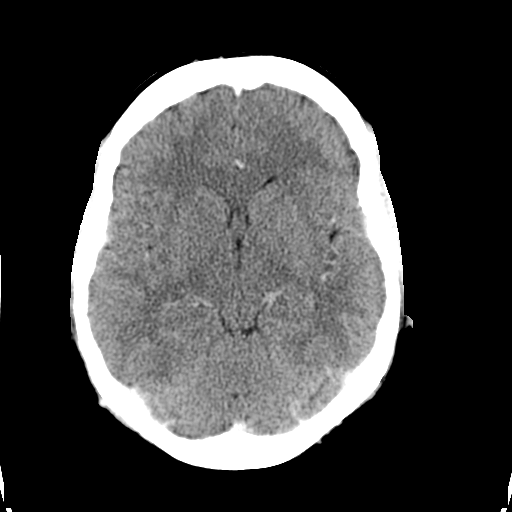
[im 14/30  brain]
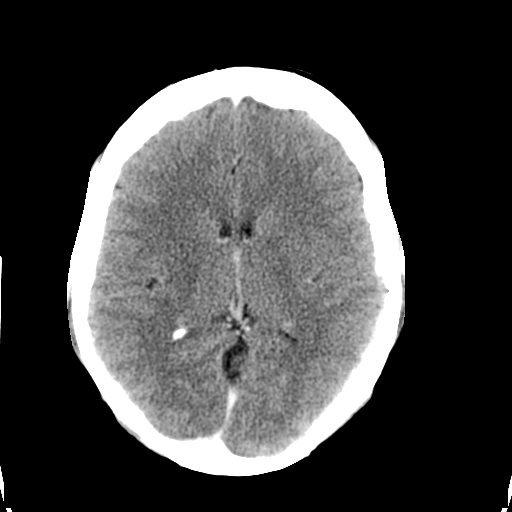
[im 14/30  bone]
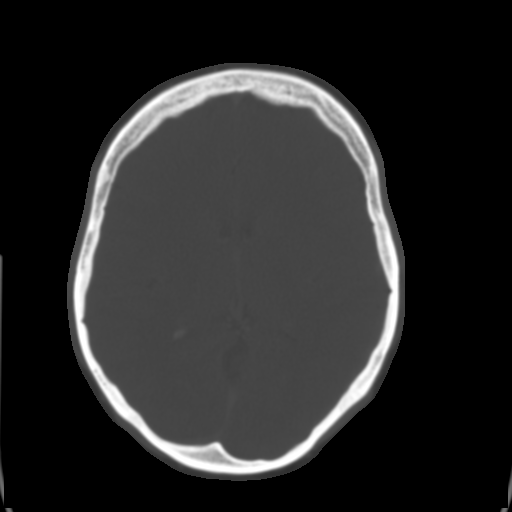
[im 16/30  brain]
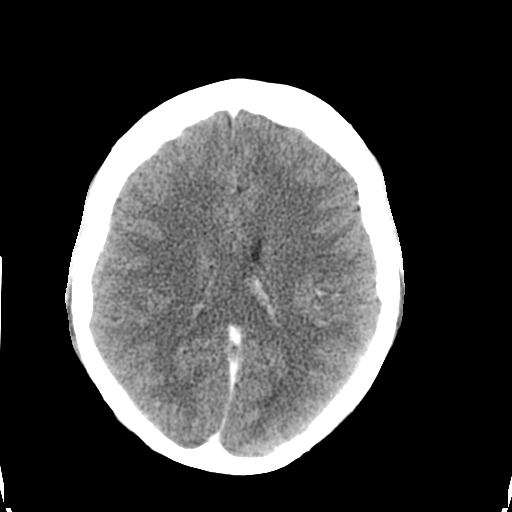
[im 19/30  brain]
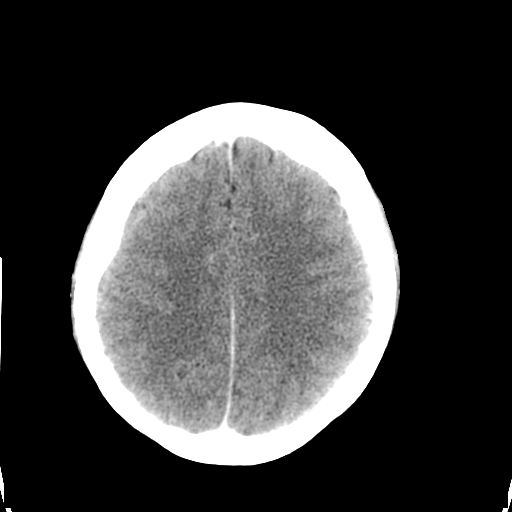
[im 22/30  brain]
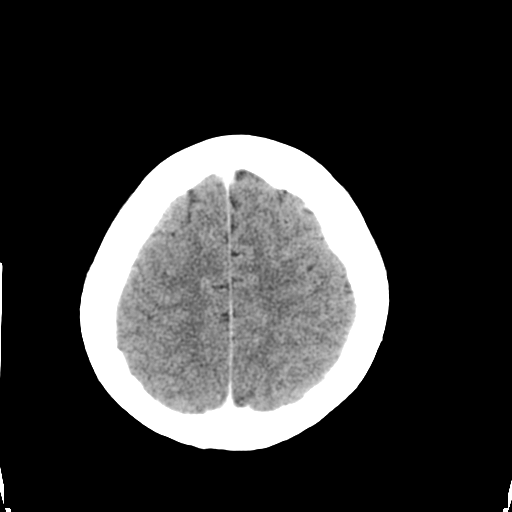
[im 24/30  brain]
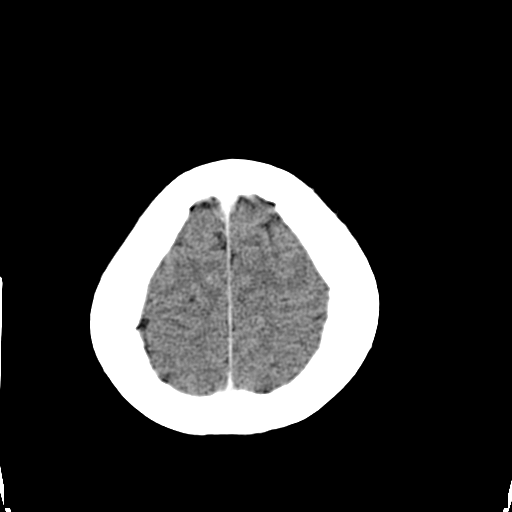
[im 24/30  bone]
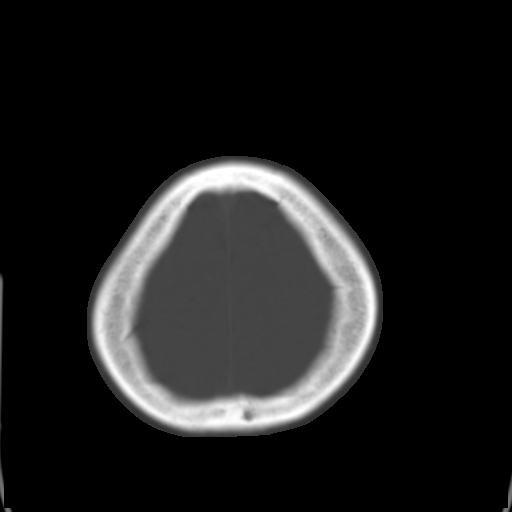
[im 27/30  brain]
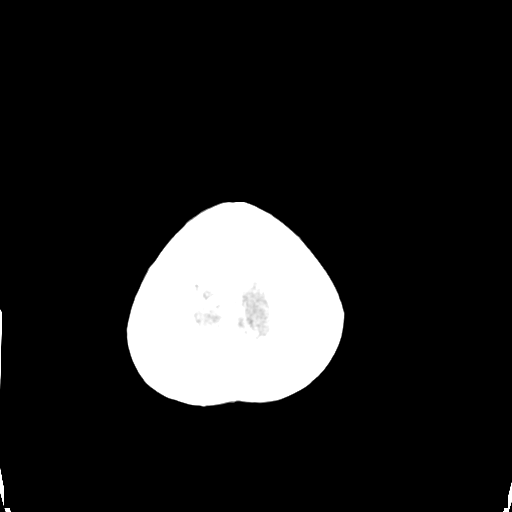

[10 of 30 positions shown; findings below may reference images not displayed]

FINDINGS: Motion degraded examination without obvious intracranial
hemorrhage or CT evidence of large acute infarct.

No hydrocephalus.

No intracranial enhancing lesion.

Cerebellar tonsils minimally low-lying.

Mastoid air cells, middle ear cavities and visualized sinuses are
clear with exception of a small polyp medial aspect right sphenoid
sinus.
IMPRESSION: Motion degraded examination without evidence of intracranial
hemorrhage or mass.

Cerebellar tonsils minimally low-lying.

## 2011-12-23 MED ORDER — IOHEXOL 300 MG/ML  SOLN
80.0000 mL | Freq: Once | INTRAMUSCULAR | Status: AC | PRN
  Administered 2011-12-23: 80 mL via INTRAVENOUS

## 2011-12-23 NOTE — Progress Notes (Signed)
12-23-11 UR completed. Ronny Flurry RN BSN

## 2011-12-23 NOTE — Telephone Encounter (Signed)
Pt crying when she called.  Tests has been scheduled for her and she didn't know why they were being ordered.  Pt encouraged to ask for explanations about tests that are being scheduled.  Pt agreed to ask for explanations.

## 2011-12-23 NOTE — Progress Notes (Signed)
Subjective: Patient's chief is intractable "Tight/piercing" frontal headaches. They are associated with nausea, are minimally relieved by narcotics, made worse with coughing, and wake her from sleep. They have been getting worse since admission. She denies photophobia, neck stiffness, fever, or chills. She has not vomited since starting phenergan.   Patient says the pain associated with her shingles is well controlled with Norco.   Objective: Vital signs in last 24 hours: Filed Vitals:   12/22/11 1631 12/22/11 2149 12/23/11 0500 12/23/11 1500  BP: 134/82 138/87 153/89 148/82  Pulse: 89 75 82 82  Temp: 98.7 F (37.1 C) 98 F (36.7 C) 97.7 F (36.5 C) 98.2 F (36.8 C)  TempSrc: Oral Oral Oral Oral  Resp: 20 19 18 20   Height:      Weight:      SpO2: 99% 99% 95% 99%   Physical Exam: Head: Atraumatic. Normocephalic. No herpetic lesions noted. Non-tender to palpation. No frontal or maxillary sinus tenderness. No tooth tenderness. No rashes present.  Eyes: Slight hyperemia along exposed sclera. No herpetic lesions. Extraocular eye movements intact. Pupils equally round and reactive to light and accomodation.  Mouth: Moist mucus membranes. No plaques noted.  Neck: Trachea midline. No thyroidmegaly. No cervical lymphadenopathy. No JVD  Pulmonary: Normal respiratory expansion. Clear to auscultation bilaterally. No wheezing, rales, rhonchii.  Cardiac: Regular rate and rhythm. No murmurs, rubs, or gallops. Normal S1 and S2.  GI: Normoactive bowel sounds. Non-tender to palpation.  Extremities: Acyanotic. No clubbing. 2+ radial and pedal pulses. Capillary refill < 2 s. No LE edema.  Derm: Round, raised, painful papules along T5/6 distribution on the right, from under the breast, around the back. > 50 lesions. Multiple hypopigmented escoriations in abdominal fold. Largely unchaged from yesterday. Hyperpigmented patches on the lower extremities bilaterally.  Neuro: 5/5 grip and foot extension  bilaterally. Normal sensation to fine touch of hands and feet bilaterally. Toes down and in.   Lab Results: Na 140, K 3.6, Cl 107, CO2 24, BUN 6, Cr 1.02, Glucose 113,  Alk Phos 83, Albumin 3.3, AST 31, ALT 59, Tprotein 6.8, Tbili 0.3  Studies/Results: Dg Chest 2 View (12/22/2011) FINDINGS: Heart size is upper normal.  Vascularity is normal. Lungs are clear without infiltrate or effusion.  Negative for mass lesion.  No bony abnormality in the thoracic spine.  IMPRESSION: Negative    Ct Head W Wo Contrast (12/23/2011) FINDINGS: Motion degraded examination without obvious intracranial hemorrhage or CT evidence of large acute infarct.  No hydrocephalus.  No intracranial enhancing lesion.  Cerebellar tonsils minimally low-lying.  Mastoid air cells, middle ear cavities and visualized sinuses are clear with exception of a small polyp medial aspect right sphenoid sinus.  IMPRESSION: Motion degraded examination without evidence of intracranial hemorrhage or mass.  Cerebellar tonsils minimally low-lying.    Medications: I have reviewed the patient's current medications. Scheduled Meds:   . acyclovir  600 mg Intravenous Q8H  . azithromycin  1,200 mg Oral Weekly  . darunavir  600 mg Oral BID WC  . emtricitabine-tenofovir  1 tablet Oral Daily  . enoxaparin  40 mg Subcutaneous Q24H  . pantoprazole  40 mg Oral Q1200  . ritonavir  100 mg Oral BID WC  . senna-docusate  2 tablet Oral BID  . sulfamethoxazole-trimethoprim  1 tablet Oral Daily  . DISCONTD: emtricitabine-tenofovir  1 tablet Oral Daily  . DISCONTD: emtricitabine-tenofovir  1 tablet Oral Daily   Continuous Infusions:   . sodium chloride 150 mL/hr at 12/23/11 1001  PRN Meds:.gi cocktail, hydrocerin, HYDROcodone-acetaminophen, iohexol, ondansetron (ZOFRAN) IV, promethazine Assessment/Plan: 1. Headaches: Patient's headaches are worrisome because they are persistent, waking her from sleep, are worse with valsalva. Given her CD4 count of 10, this  is worrisome for opportunistic infections. Contrasted CT of the head did not reveal any ring enhancing lesions, or hypodense area. Patient is at risk for typical intracerebral infections, plus toxoplasma, lymphoma, and cryptococcus. Patient currently does not have any b-symptoms. Will evaluate her risk of cryptococcal meningitis with a lumbar puncture. Will continue to monitor vital signs in the mean time. -- LP in the morning with cell count, opening pressure, gram stain/culture, cryptococcus pcr, HSV PCR  2. Shingles: Patient presents with painful papules along a T5/6 dermatomal distribution. These papules look minimally vesicular and have yet to rupture. > 50 lesions, but the number of lesions appears stable. Watching patient for disseminated disease. LFTs are trending down compared to admission. Her Cr is stable.  -- Maintain Hydrocodone/Acetominophen 5/325 1-2 tabs q4h po prn for pain.  -- Maintain Acyclovir therapy 600mg  iv q8h. Appreciate pharmacy recommendations.  -- Premedicate Acyclovir therapy with Phenergan -- Provide NS at 124ml/hr  -- Monitor Cr  -- Monitor LFTs   3. HIV: Patient was diagnosed with HIV in 2005, however, she may have had HIV longer as she was never tested during her shingles breakout in 2000. Her most recent CD4+ on 12/22/11 was 10. Her most recent viral load was 79,100 on 04/19/11. We have resumed ART, and opportunistic infection prophylaxis -- Maintain Azithromycin 1200mg  po Weekly (First dose Tuesday, 12/20/10)  -- Maintain Trimethoprim/Sulfamethoxazole 80/400 1 tab daily  -- HIV viral load pending.  -- Maintain Truvada 200-300, 1 tab daily -- Maintain Norvir 100mg  po bid -- Maintain Prezista 600mg  po bid  4. 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 This is a chronic problem. Currently the skin is escoriated, but is not red or inflamed.  -- Eucerin Cream   5. VTE Prophylaxis: Lovenox   LOS: 2 days   Wilmer Floor 12/23/2011, 4:12 PM

## 2011-12-23 NOTE — Clinical Documentation Improvement (Signed)
HIV Documentation Clarification Query  THIS DOCUMENT IS NOT A PERMANENT PART OF THE MEDICAL RECORD  TO RESPOND TO THE THIS QUERY, FOLLOW THE INSTRUCTIONS BELOW:  1. If needed, update documentation for the patient's encounter via the notes activity.  2. Access this query again and click edit on the In [employer].  3. After updating, or not, click F2 to complete all highlighted (required) fields concerning your review. Select "additional documentation in the medical record" OR "no additional documentation provided".  4. Click Sign note button.  5. The deficiency will fall out of your In Basket *Please let us know if you are not able to complete this workflow by phone or e-mail (listed below).         12/23/11  Dear Dr. [facility]  In an effort to better capture your patient's severity of illness, reflect appropriate length of stay and utilization of resources, a review of the patient medical record has revealed the following indicators.    Based on your clinical judgment, please clarify and document in a progress note and/or discharge summary the clinical condition associated with the following supporting information:  In responding to this query please exercise your independent judgment.  The fact that a query is asked, does not imply that any particular answer is desired or expected.  Please clarify pt's current HIV status and document it Progress Notes and Disharge summary.  Possible Clinical Conditions?  ______AIDS  ______HIV Disease  ______AIDS Related Complex  _____Other Condition  _____Cannot Clinically Determine   Supporting Information:  Risk Factors: "Patient was diagnosed with HIV in 2005, however, she may have had HIV longer" ~per notes  Diagnostics: "CD4 count:  most recent CD4+ on 01/20/11 was 28" ~per notes "Most recent viral load was 79,100 on 04/19/11" "Patient does not take her antiretroviral therapy"  Treatment ritonavir (NORVIR)  tablet 100 mg emtricitabine-tenofovir (TRUVADA) 200-300 MG per tablet 1 tablet    darunavir (PREZISTA) tablet 600 mg   You may use possible, probable, or suspect with inpatient documentation. Possible, probable, suspected diagnoses MUST be documented at the time of discharge.   Reviewed: additional documentation in the medical record  Thank You,    Rossie Muskrat RN, BSN Clinical Documentation Specialist Pager:  [phone] [email or web address]  Health Information Management [place]

## 2011-12-24 ENCOUNTER — Inpatient Hospital Stay (HOSPITAL_COMMUNITY): Payer: Medicare Other

## 2011-12-24 LAB — GRAM STAIN

## 2011-12-24 LAB — CSF CELL COUNT WITH DIFFERENTIAL
Lymphs, CSF: 89 % — ABNORMAL HIGH (ref 40–80)
Monocyte-Macrophage-Spinal Fluid: 10 % — ABNORMAL LOW (ref 15–45)
Segmented Neutrophils-CSF: 1 % (ref 0–6)
Tube #: 3

## 2011-12-24 LAB — RPR: RPR Ser Ql: NONREACTIVE

## 2011-12-24 LAB — HERPES SIMPLEX VIRUS(HSV) DNA BY PCR
HSV 1 DNA: NOT DETECTED
HSV 2 DNA: NOT DETECTED

## 2011-12-24 LAB — CRYPTOCOCCAL ANTIGEN, CSF: Crypto Ag: NEGATIVE

## 2011-12-24 LAB — VDRL, CSF: VDRL Quant, CSF: NONREACTIVE

## 2011-12-24 LAB — PROTEIN AND GLUCOSE, CSF: Total  Protein, CSF: 37 mg/dL (ref 15–45)

## 2011-12-24 IMAGING — RF DG FLUORO GUIDE LUMBAR PUNCTURE
1 series · 1 of 1 positions shown · non-contrast
Comparison: None.

CLINICAL DATA: Chronic headaches

LUMBAR PUNCTURE FLUORO GUIDE

[Series 1: run · 1 of 1 slices shown]
[im 1/1]
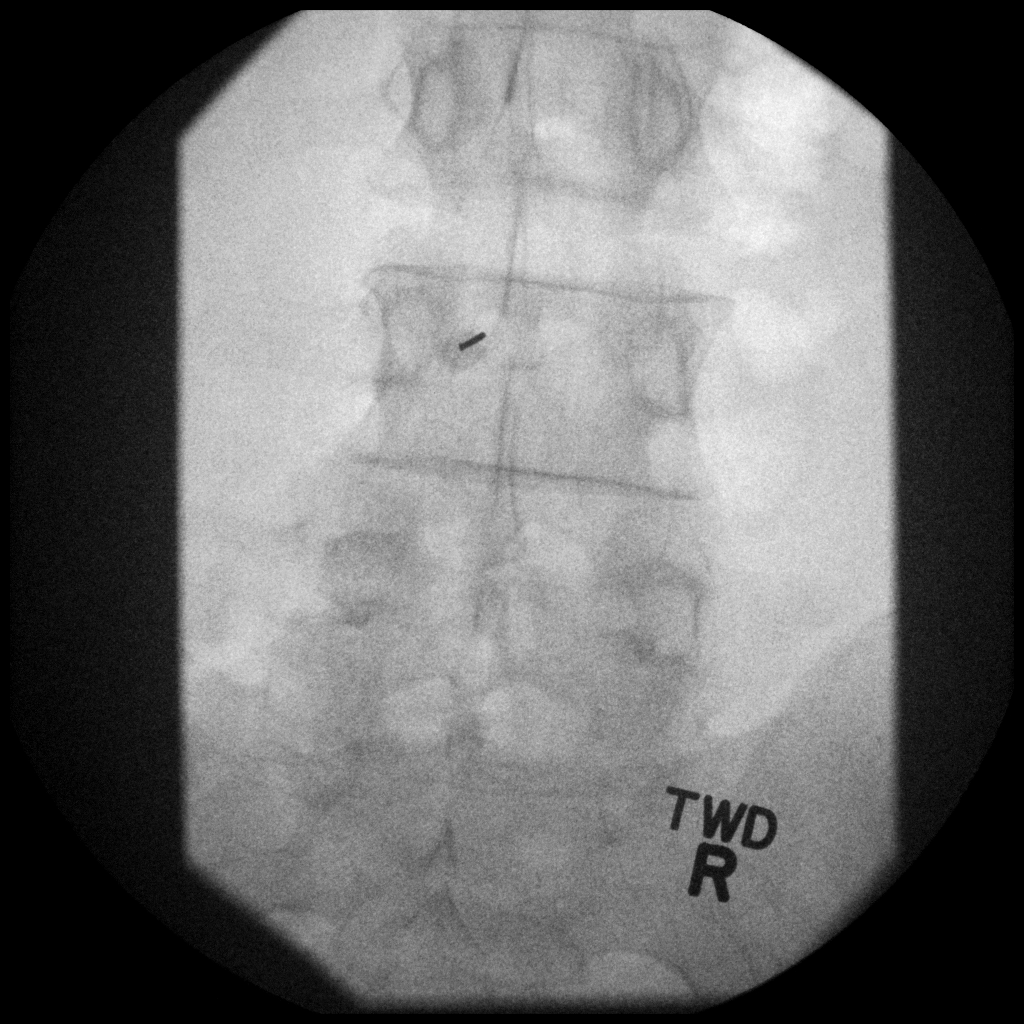

[1 of 1 positions shown; findings below may reference images not displayed]

FINDINGS: Using sterile technique and local anesthesia, a lumbar
puncture was performed at the L3-4 level.  9 ml of clear CSF were
obtained and sent for the requested laboratory studies.  The
opening pressure was measured 25 cm of water.  The patient
tolerated the procedure well.
IMPRESSION: Lumbar puncture performed with 9 ml of clear CSF obtained in
sagittal question laboratory studies.

## 2011-12-24 MED ORDER — OXYCODONE HCL 5 MG PO TABS
10.0000 mg | ORAL_TABLET | ORAL | Status: DC | PRN
  Administered 2011-12-24: 10 mg via ORAL
  Filled 2011-12-24: qty 2

## 2011-12-24 NOTE — Progress Notes (Signed)
CRITICAL VALUE ALERT  Critical value received:  WBC 45 CSF   Date of notification:  12/24/2011  Time of notification:  11:02 AM   Critical value read back:yes  Nurse who received alert:  Driggers, Rae Roam   MD notified (1st page):  Wylene Men, [facility] medical student, teaching service  Time of first page: 11:02 AM   MD notified (2nd page):  Time of second page:  Responding MD: Wylene Men  Time MD responded:  11:02 AM

## 2011-12-24 NOTE — Procedures (Signed)
Using sterile technique and local anesthesia, a lumbar puncture was performed at the L3-4 level.  9 ml of clear CSF were obtained and sent for the requested laboratory studies.  The opening pressure was measured 25 cm of water.  The patient tolerated the procedure well.

## 2011-12-24 NOTE — Progress Notes (Signed)
Subjective: Patient slept well overnight. Her appetite is improving. She has had a bowel movement. She received a lumbar puncture in the morning and tolerated the procedure well. Patient's headache felt better after lumbar puncture. She denies fever, chills, neck stiffness, photophobia, muscle weakness, or changes in her visual field. She says that the shingles pain is well controlled.   Objective: Vital signs in last 24 hours: Filed Vitals:   12/23/11 0500 12/23/11 1500 12/23/11 2126 12/24/11 0600  BP: 153/89 148/82 110/69 139/86  Pulse: 82 82 77 79  Temp: 97.7 F (36.5 C) 98.2 F (36.8 C) 98 F (36.7 C) 98.1 F (36.7 C)  TempSrc: Oral Oral Oral   Resp: 18 20 18 18   Height:      Weight:      SpO2: 95% 99% 99% 99%   Physical Exam: Head: Atraumatic. Normocephalic. No herpetic lesions noted. Non-tender to palpation. No frontal or maxillary sinus tenderness.  Eyes: Sclera erythema is improved today. Extraocular eye movements intact. Pupils equally round and reactive to light and accomodation.  Mouth: Moist mucus membranes. No plaques noted.  Neck: Trachea midline. No thyroidmegaly. No cervical lymphadenopathy. No JVD  Pulmonary: Normal respiratory expansion. Clear to auscultation bilaterally. No wheezing, rales, rhonchii.  Cardiac: Regular rate and rhythm. No murmurs, rubs, or gallops. Normal S1 and S2.  GI: Normoactive bowel sounds. Non-tender to palpation.  Extremities: Acyanotic. No clubbing. 2+ radial and pedal pulses. Capillary refill < 2 s. No LE edema.  Derm: Round, raised, painful vesicles along T5/6 distribution on the right, from under the breast, around the back. > 50 lesions. Multiple hypopigmented escoriations in abdominal fold, which are unchaged from yesterday. Hyperpigmented patches on the lower extremities bilaterally.  Neuro: 5/5 grip and foot extension bilaterally.  Lab Results: CSF Analysis: RBCs 7, WBCs 45, Neutrophils 1, Lymphocytes 89, Appearance-clear,  Color-colorless. Opening Pressure 25 cm H2O. Cryptococcus Ag (negative).  HIV RNA quantification 200,469  Micro Results: Recent Results (from the past 240 hour(s))  MRSA PCR SCREENING     Status: Normal   Collection Time   12/21/11  3:56 PM      Component Value Range Status Comment   MRSA by PCR NEGATIVE  NEGATIVE  Final   GRAM STAIN     Status: Normal   Collection Time   12/24/11  9:28 AM      Component Value Range Status Comment   Specimen Description CSF   Final    Special Requests TUBE 2 2.2CC   Final    Gram Stain     Final    Value: CYTOSPIN PREP     WBC PRESENT, PREDOMINANTLY MONONUCLEAR     NO ORGANISMS SEEN   Report Status 12/24/2011 FINAL   Final    Studies/Results: Dg Fluoro Guide Lumbar Puncture (12/24/2011) FINDINGS: Using sterile technique and local anesthesia, a lumbar puncture was performed at the L3-4 level.  9 ml of clear CSF were obtained and sent for the requested laboratory studies.  The opening pressure was measured 25 cm of water.  The patient tolerated the procedure well.  IMPRESSION: Lumbar puncture performed with 9 ml of clear CSF obtained in sagittal question laboratory studies.  Original Report Authenticated By: Juline Patch, M.D.   Medications: I have reviewed the patient's current medications. Scheduled Meds:   . acyclovir  600 mg Intravenous Q8H  . azithromycin  1,200 mg Oral Weekly  . darunavir  600 mg Oral BID WC  . emtricitabine-tenofovir  1 tablet Oral Daily  .  enoxaparin  40 mg Subcutaneous Q24H  . pantoprazole  40 mg Oral Q1200  . ritonavir  100 mg Oral BID WC  . senna-docusate  2 tablet Oral BID  . sulfamethoxazole-trimethoprim  1 tablet Oral Daily   Continuous Infusions:   . sodium chloride 150 mL/hr at 12/23/11 1755   PRN Meds:.gi cocktail, hydrocerin, HYDROcodone-acetaminophen, ondansetron (ZOFRAN) IV, promethazine Assessment/Plan: 1. Headaches: Patient's LP has a pressure elevated to 25 cm H2O. Her headache was relieved by the lumbar  puncture. CSF cell count shows a WBC count of 45, with an 89% lymphocytic predominance. CSF cryptococcal antigen testing is negative. Patient likely has a viral CNS infection. 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 However, we will also evaluate CSF for HSV, enterovirus, VDRL. We will also assess serum TB, RPR, and toxoplasma antibodies. Patient has not had a fever, but she received significant acetominophen daily as part of her shingles pain control. Will modify her pain control regimen. Her acyclovir regimen for shingles would also be therapeutic for a VZV meningitis.  -- CSF protein, glucose, culture, HSV, Enterovirus, VDRL pending -- Serum Quantiferon TB gold assay, Toxoplasma IgG/IgM, RPR pending  2. Shingles: Patient's papules have turned into vesicles along the T5/6 dermatomal distribution. Many of these vesicles have begun to rupture. Her pain is well controlled. Will alter narcotic regimen to discontinue acetominophen. -- Discontinue Hydrocodone/Acetominophen 5/325 1-2 tabs q4h po prn for pain.  -- Add Oxycodone 10mg  po q4h prn for pain -- Maintain Acyclovir therapy 600mg  iv q8h. Appreciate pharmacy recommendations.  -- Premedicate Acyclovir therapy with Phenergan  -- Provide NS at 142ml/hr   3. HIV: Patient was diagnosed with HIV in 2005. Her CD4 count and viral load on admission were 10 and 200,469 respectively. We have resumed ART, and opportunistic infection prophylaxis  -- Maintain Azithromycin 1200mg  po Weekly (First dose Tuesday, 12/20/10)  -- Maintain Trimethoprim/Sulfamethoxazole 80/400 1 tab daily  -- Maintain Truvada 200-300, 1 tab daily  -- Maintain Norvir 100mg  po bid  -- Maintain Prezista 600mg  po bid   4. 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 This is a chronic problem. Currently the skin is escoriated, but is not red or inflamed.  -- Eucerin Cream   5. VTE Prophylaxis: Lovenox   LOS: 3 days    Wilmer Floor 12/24/2011, 10:57 AM

## 2011-12-24 NOTE — Progress Notes (Signed)
Internal Medicine Teaching Service Attending Note Date: 12/23/2011 Patient name: [name]  Medical record number: [id number]  Date of birth: 1966-03-16    This patient has been seen and discussed with the house staff on 12/23/2011. Please see their note for complete details. I concur with their findings with the following additions/corrections: will get LP to check for opening pressure, CSF cell count, cryptococcal antigen, hsv/vzv pcr, and RPR.   Cavan Bearden

## 2011-12-24 NOTE — Progress Notes (Signed)
Internal Medicine Teaching Service Attending Note Date: 12/24/2011  Patient name: [name]  Medical record number: [id number]  Date of birth: August 05, 1966    This patient has been seen and discussed with the house staff. Please see their note for complete details. I concur with their findings with the following additions/corrections: Patient underwent LP thru IR without difficulty. She had an elevated opening pressure of 26, elevated WBC of 45, consistent with lymphocytic pleocytosis. She states she feels her headache mildly improved since LP. Crypto Ag from CSF is negative. Await other assays from CSF including HSV PCR, VZV PCR, enterovirus pcr, aerobic culture, VDRL, and serum RPR. Patient is surprisingly asymptomatic for aseptic meningitis. 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 Continue with airborne and contact isolation. She is tolerating her HIV medications without difficulty.  Duke Salvia Drue Second MD MPH [facility] [phone]   Judyann Munson 12/24/2011, 3:03 PM

## 2011-12-25 LAB — TOXOPLASMA ANTIBODIES- IGG AND  IGM: Toxoplasma IgG Ratio: 0.8 IU/mL (ref <6.4)

## 2011-12-25 LAB — COMPREHENSIVE METABOLIC PANEL
ALT: 46 U/L — ABNORMAL HIGH (ref 0–35)
Albumin: 3.3 g/dL — ABNORMAL LOW (ref 3.5–5.2)
Alkaline Phosphatase: 84 U/L (ref 39–117)
BUN: 8 mg/dL (ref 6–23)
Calcium: 8.9 mg/dL (ref 8.4–10.5)
GFR calc Af Amer: 81 mL/min — ABNORMAL LOW (ref >90)
Glucose, Bld: 95 mg/dL (ref 70–99)
Potassium: 3.6 mEq/L (ref 3.5–5.1)
Sodium: 141 mEq/L (ref 135–145)
Total Protein: 6.7 g/dL (ref 6.0–8.3)

## 2011-12-25 MED ORDER — HYDROCODONE-ACETAMINOPHEN 5-325 MG PO TABS
1.0000 | ORAL_TABLET | ORAL | Status: DC | PRN
  Administered 2011-12-25 – 2011-12-28 (×7): 2 via ORAL
  Filled 2011-12-25 (×7): qty 2

## 2011-12-25 NOTE — Progress Notes (Signed)
Subjective: Pt c/o nausea and unrest since getting oxycodone yesterday.  Asking for vicodin instead.  HA remains improved.  Objective: Vital signs in last 24 hours: Filed Vitals:   12/24/11 0600 12/24/11 1321 12/24/11 2126 12/25/11 0503  BP: 139/86 147/80 150/91 124/75  Pulse: 79 76 75 75  Temp: 98.1 F (36.7 C) 98 F (36.7 C) 98 F (36.7 C) 98.1 F (36.7 C)  TempSrc:  Oral    Resp: 18 20 20 20   Height:      Weight:      SpO2: 99% 96% 97% 99%   Physical Exam: Head: No herpetic lesions noted. Pulmonary: Normal respiratory expansion. Clear to auscultation bilaterally. No wheezing, rales, rhonchii.  Cardiac: Regular rate and rhythm. No murmurs, rubs, or gallops. Normal S1 and S2.   Derm: Round, raised, vesicles along T5/6 distribution on the right, from under the breast, around the back. > 50 lesions, improved from admission. Multiple hypopigmented escoriations in abdominal fold, which are unchaged. Hyperpigmented patches on the lower extremities bilaterally.   Lab Results: CSF Analysis: RBCs 7, WBCs 45, Neutrophils 1, Lymphocytes 89, Appearance-clear, Color-colorless. Opening Pressure 25 cm H2O. Cryptococcus Ag (negative).  HIV RNA quantification 200,469  Micro Results: Recent Results (from the past 240 hour(s))  MRSA PCR SCREENING     Status: Normal   Collection Time   12/21/11  3:56 PM      Component Value Range Status Comment   MRSA by PCR NEGATIVE  NEGATIVE  Final   GRAM STAIN     Status: Normal   Collection Time   12/24/11  9:28 AM      Component Value Range Status Comment   Specimen Description CSF   Final    Special Requests TUBE 2 2.2CC   Final    Gram Stain     Final    Value: CYTOSPIN PREP     WBC PRESENT, PREDOMINANTLY MONONUCLEAR     NO ORGANISMS SEEN   Report Status 12/24/2011 FINAL   Final    Studies/Results: Dg Fluoro Guide Lumbar Puncture (12/24/2011) FINDINGS: Using sterile technique and local anesthesia, a lumbar puncture was performed at the L3-4 level.   9 ml of clear CSF were obtained and sent for the requested laboratory studies.  The opening pressure was measured 25 cm of water.  The patient tolerated the procedure well.  IMPRESSION: Lumbar puncture performed with 9 ml of clear CSF obtained in sagittal question laboratory studies.  Original Report Authenticated By: Juline Patch, M.D.   Medications: I have reviewed the patient's current medications. Scheduled Meds:    . acyclovir  600 mg Intravenous Q8H  . azithromycin  1,200 mg Oral Weekly  . darunavir  600 mg Oral BID WC  . emtricitabine-tenofovir  1 tablet Oral Daily  . enoxaparin  40 mg Subcutaneous Q24H  . pantoprazole  40 mg Oral Q1200  . ritonavir  100 mg Oral BID WC  . senna-docusate  2 tablet Oral BID  . sulfamethoxazole-trimethoprim  1 tablet Oral Daily   Continuous Infusions:    . sodium chloride 150 mL/hr at 12/24/11 1547   PRN Meds:.gi cocktail, hydrocerin, ondansetron (ZOFRAN) IV, oxyCODONE, promethazine, DISCONTD: HYDROcodone-acetaminophen  Assessment/Plan: # Headache Improved after LP, which indicated possible meningitis.  Awaiting final results, but HSV negative, cryto negative, VZV and enterovirus pending.  RPR neg.  We will also assess serum TB, RPR, and toxoplasma antibodies.  Her acyclovir regimen for shingles would also be therapeutic for a VZV meningitis - f/u CSF  studies - con't pain control - con't acyclovir - f/u TB, toxo  # Shingles Much improved from admission.  Had been put on oxycodone to limit APAP, but pt had bad response c nausea/unrest.  LFT's trending down, so will restart hydrocodone with limited apap. Patient's papules have turned into vesicles along the T5/6 dermatomal distribution. Many of these vesicles have begun to rupture. Her pain is well controlled. Will alter narcotic regimen to discontinue acetominophen. - norco 1-2 tabs q4h PRN - con't Acyclovir therapy 600mg  iv q8h. Appreciate pharmacy recommendations.  - premedicate Acyclovir  therapy with Phenergan   # HIV CD4 count and viral load on admission were 10 and 200,469 respectively. Have resumed ART, and OI prophylaxis. 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 This is a chronic problem. Currently the skin is escoriated, but is not red or inflamed.  - Eucerin Cream   # VTE Prophylaxis: Lovenox   LOS: 4 days   WILDMAN-TOBRINER, BEN 12/25/2011, 8:35 AM

## 2011-12-26 ENCOUNTER — Inpatient Hospital Stay (HOSPITAL_COMMUNITY): Payer: Medicare Other

## 2011-12-26 LAB — CSF CELL COUNT WITH DIFFERENTIAL
RBC Count, CSF: 6 /mm3 — ABNORMAL HIGH
Tube #: 1

## 2011-12-26 LAB — CRYPTOCOCCAL ANTIGEN, CSF: Crypto Ag: NEGATIVE

## 2011-12-26 IMAGING — RF DG FLUORO GUIDE LUMBAR PUNCTURE
3 series · 3 of 3 positions shown · non-contrast
Comparison: none

CLINICAL DATA: Increased intracranial pressure and nuchal rigidity

DIAGNOSTIC LUMBAR PUNCTURE UNDER FLUOROSCOPIC GUIDANCE
Fluoroscopy time:  0.17 minutes.
TECHNIQUE: Informed consent was obtained from the patient prior to
the procedure, including potential complications of headache,
allergy, and pain.   With the patient prone, the lower back was
prepped with Betadine.  1% Lidocaine was used for local anesthesia.
Lumbar puncture was performed at the L2-L3 level using a 22 gauge
needle with return of clear CSF with an opening pressure of 11 cm
water.   9 ml of CSF were obtained for laboratory studies.  The
patient tolerated the procedure well and there were no apparent
complications.
Please note that the lumber pressure was initially attempted at L3-
L4 but blood was identified in the needle prior to advancement into
the CSF.  This blood could be related to a vein or a small hematoma
in the subcutaneous tissues related to prior lumbar puncture
attempts.

[Series 1: run · 1 of 1 slices shown (1 of 3)]
[im 1/1]
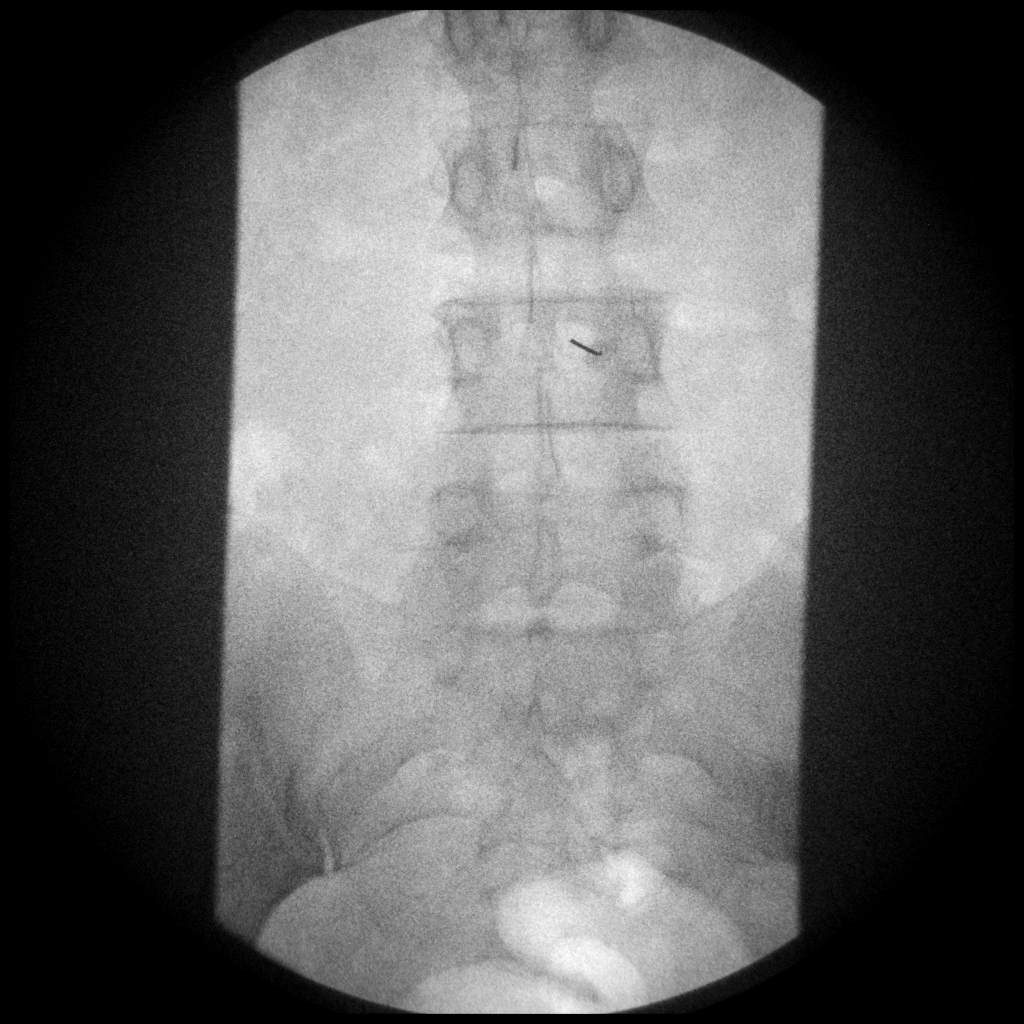

[Series 2: run · 1 of 1 slices shown (2 of 3)]
[im 1/1]
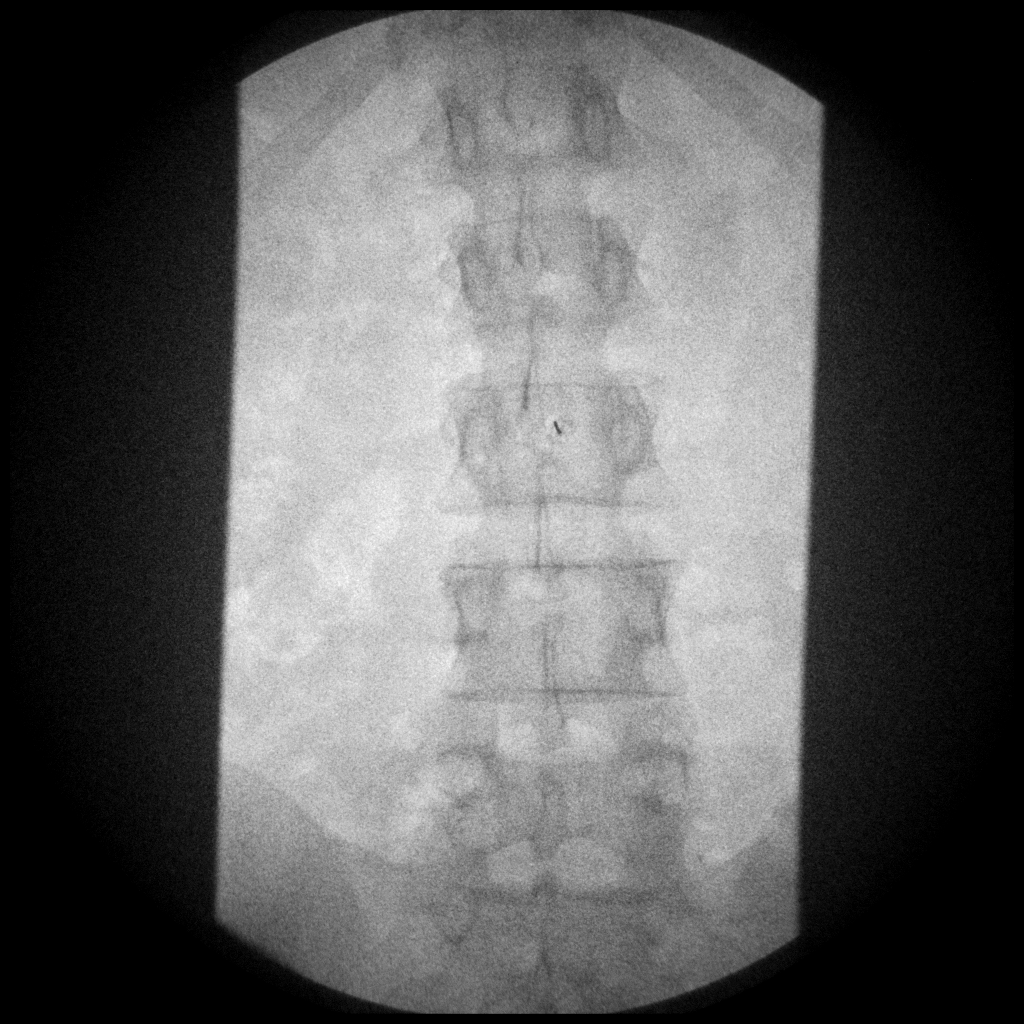

[Series 3: run · 1 of 1 slices shown (3 of 3)]
[im 1/1]
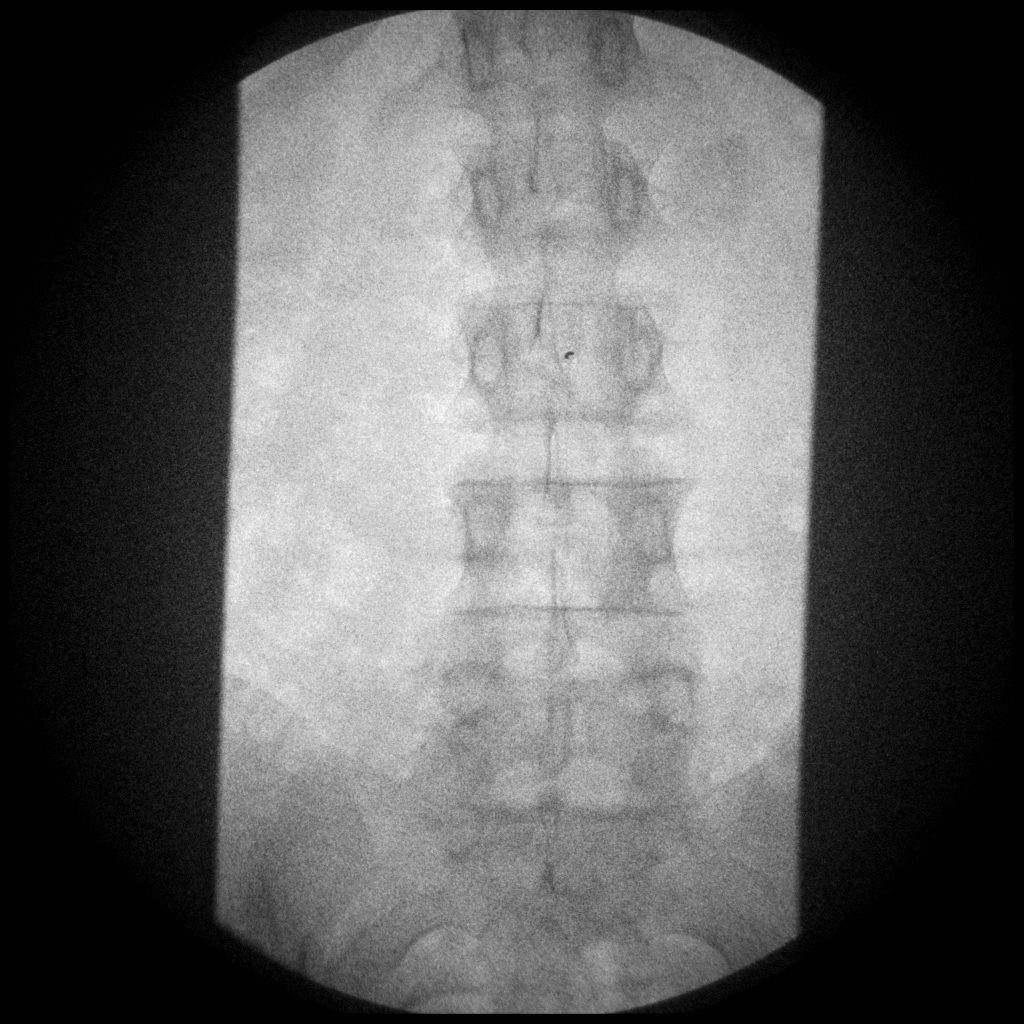

[3 of 3 positions shown; findings below may reference images not displayed]

IMPRESSION: Successful fluoroscopic guided lumbar puncture.

Opening pressure was 11 cm.

## 2011-12-26 MED ORDER — ONDANSETRON HCL 4 MG/2ML IJ SOLN
4.0000 mg | Freq: Once | INTRAMUSCULAR | Status: AC
  Administered 2011-12-26: 4 mg via INTRAVENOUS

## 2011-12-26 MED ORDER — HYDROMORPHONE HCL PF 1 MG/ML IJ SOLN: 0.5000 mg | INTRAMUSCULAR | Status: DC | PRN

## 2011-12-26 MED ORDER — DEXTROSE 5 % IV SOLN: 0.7000 mg/kg | INTRAVENOUS | Status: DC

## 2011-12-26 MED ORDER — LORAZEPAM 2 MG/ML IJ SOLN
INTRAMUSCULAR | Status: AC
  Administered 2011-12-26: 1 mg via INTRAVENOUS
  Filled 2011-12-26: qty 1

## 2011-12-26 MED ORDER — LIDOCAINE HCL (PF) 1 % IJ SOLN
10.0000 mL | Freq: Once | INTRAMUSCULAR | Status: AC
  Administered 2011-12-26: 10 mL
  Filled 2011-12-26: qty 10

## 2011-12-26 MED ORDER — LORAZEPAM 2 MG/ML IJ SOLN
1.0000 mg | Freq: Once | INTRAMUSCULAR | Status: AC
  Administered 2011-12-26: 1 mg via INTRAVENOUS

## 2011-12-26 MED ORDER — LORAZEPAM 2 MG/ML IJ SOLN: 1.0000 mg | Freq: Four times a day (QID) | INTRAMUSCULAR | Status: DC | PRN

## 2011-12-26 MED ORDER — FLUCYTOSINE 500 MG PO CAPS: 1500.0000 mg | ORAL_CAPSULE | Freq: Four times a day (QID) | ORAL | Status: DC

## 2011-12-26 NOTE — Progress Notes (Signed)
Subjective: Patient complaining of severe nausea and had vomited this morning with severe headaches which followed vomiting. She says that her headache has gradually been getting worse ever after getting that LP 2 days ago(which relieved the headache).   Objective: Vital signs in last 24 hours: Filed Vitals:   12/25/11 0503 12/25/11 1436 12/25/11 2115 12/26/11 0556  BP: 124/75 130/76 162/99 125/68  Pulse: 75 81 79 75  Temp: 98.1 F (36.7 C) 98.5 F (36.9 C) 98.1 F (36.7 C) 97.9 F (36.6 C)  TempSrc:  Oral Oral Oral  Resp: 20 18 14 15   Height:      Weight:      SpO2: 99% 100% 100% 100%   Physical Exam: Gen: Patient looks distressed, alert oriented X3 Head: No herpetic lesions noted. Nuchal rigidity present Pulmonary: Normal respiratory expansion. Clear to auscultation bilaterally. No wheezing, rales, rhonchii.  Cardiac: Regular rate and rhythm. No murmurs, rubs, or gallops. Normal S1 and S2.   Derm: Round, raised, vesicles along T5/6 distribution on the right, from under the breast, around the back. > 50 lesions, improved from admission. Multiple hypopigmented escoriations in abdominal fold, which are unchaged. Hyperpigmented patches on the lower extremities bilaterally.   Lab Results: CSF Analysis: RBCs 7, WBCs 45, Neutrophils 1, Lymphocytes 89, Appearance-clear, Color-colorless. Opening Pressure 25 cm H2O. Cryptococcus Ag (negative).  HIV RNA quantification 200,469  Micro Results: Recent Results (from the past 240 hour(s))  MRSA PCR SCREENING     Status: Normal   Collection Time   12/21/11  3:56 PM      Component Value Range Status Comment   MRSA by PCR NEGATIVE  NEGATIVE  Final   BODY FLUID CULTURE     Status: Normal (Preliminary result)   Collection Time   12/24/11  9:28 AM      Component Value Range Status Comment   Specimen Description CSF   Final    Special Requests TUBE 2 2.2CC   Final    Gram Stain     Final    Value: WBC PRESENT, PREDOMINANTLY MONONUCLEAR     NO  ORGANISMS SEEN     Performed at [facility]   Culture NO GROWTH 1 DAY   Final    Report Status PENDING   Incomplete   GRAM STAIN     Status: Normal   Collection Time   12/24/11  9:28 AM      Component Value Range Status Comment   Specimen Description CSF   Final    Special Requests TUBE 2 2.2CC   Final    Gram Stain     Final    Value: CYTOSPIN PREP     WBC PRESENT, PREDOMINANTLY MONONUCLEAR     NO ORGANISMS SEEN   Report Status 12/24/2011 FINAL   Final    Studies/Results:   Medications: I have reviewed the patient's current medications. Scheduled Meds:    . acyclovir  600 mg Intravenous Q8H  . azithromycin  1,200 mg Oral Weekly  . darunavir  600 mg Oral BID WC  . emtricitabine-tenofovir  1 tablet Oral Daily  . enoxaparin  40 mg Subcutaneous Q24H  . pantoprazole  40 mg Oral Q1200  . ritonavir  100 mg Oral BID WC  . senna-docusate  2 tablet Oral BID  . sulfamethoxazole-trimethoprim  1 tablet Oral Daily   Continuous Infusions:   PRN Meds:.gi cocktail, hydrocerin, HYDROcodone-acetaminophen, ondansetron (ZOFRAN) IV, promethazine  Assessment/Plan: # Headache Improved after LP but now again worsening. Patient has  worsening nausea and vomiting and also headache is worse with vomiting and cough which suggests increased ICP. The differential for raised ICP could be IRIS. Awaiting final results, but HSV negative, cryto negative, Toxoplasma negative, RPR is NR, HSV 1, HSV 2 negative, VZV and enterovirus pending.  RPR neg.  We will also assess serum TB.  Her acyclovir regimen for shingles would also be therapeutic for a VZV meningitis - f/u CSF studies - con't pain control - con't acyclovir - Repeat LP today for both therapeutic and diagnostic. Send for repeat cultures and pcr for crypto  # Shingles Much improved from admission.  Patient's papules have turned into vesicles along the T5/6 dermatomal distribution. Many of these vesicles have begun to rupture. Her pain is well  controlled. Will alter narcotic regimen to discontinue acetominophen. - Start dilaudid IV small dose for pain - con't Acyclovir therapy 600mg  iv q8h. Appreciate pharmacy recommendations.  - premedicate Acyclovir therapy with Phenergan   # HIV CD4 count and viral load on admission were 10 and 200,469 respectively. Have resumed ART, and OI prophylaxis. 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 This is a chronic problem. Currently the skin is escoriated, but is not red or inflamed.  - Eucerin Cream   # VTE Prophylaxis: Lovenox   LOS: 5 days   Oswell Say 12/26/2011, 1:29 PM

## 2011-12-26 NOTE — Progress Notes (Signed)
Dr. Eben Burow and Dr. Josem Kaufmann at the bedside performing an LP. Verbal orders for IV zofran x1 and IV ativan x1 given by Dr. Eben Burow prior to procedure. Julien Nordmann [facility]

## 2011-12-26 NOTE — Procedures (Signed)
Fluoro guided LP.  Opening pressure = 11.  9 ml of clear CSF removed.  No immediate complication.

## 2011-12-27 LAB — ENTEROVIRUS PCR: Enterovirus PCR: NOT DETECTED

## 2011-12-27 LAB — PATHOLOGIST SMEAR REVIEW: Tech Review: INCREASED

## 2011-12-27 LAB — VARICELLA-ZOSTER BY PCR: Varicella-Zoster, PCR: DETECTED — AB

## 2011-12-27 LAB — BODY FLUID CULTURE: Culture: NO GROWTH

## 2011-12-27 MED ORDER — ADULT MULTIVITAMIN W/MINERALS CH
1.0000 | ORAL_TABLET | Freq: Every day | ORAL | Status: DC
  Administered 2011-12-27 – 2011-12-28 (×2): 1 via ORAL
  Filled 2011-12-27 (×2): qty 1

## 2011-12-27 MED ORDER — ONDANSETRON HCL 4 MG PO TABS
4.0000 mg | ORAL_TABLET | ORAL | Status: DC | PRN
  Administered 2011-12-27 – 2011-12-28 (×2): 4 mg via ORAL
  Filled 2011-12-27 (×2): qty 1

## 2011-12-27 MED ORDER — VALACYCLOVIR HCL 500 MG PO TABS
1000.0000 mg | ORAL_TABLET | Freq: Three times a day (TID) | ORAL | Status: DC
  Administered 2011-12-27 – 2011-12-28 (×3): 1000 mg via ORAL
  Filled 2011-12-27 (×5): qty 2

## 2011-12-27 NOTE — Progress Notes (Addendum)
Subjective: S/p LP yesterday.  No HA this a.m.  Pt feeling good this morning.  VZV from first LP returned positive.  Objective: Vital signs in last 24 hours: Filed Vitals:   12/26/11 1400 12/26/11 1600 12/26/11 2205 12/27/11 0548  BP: 164/103 133/93 134/76 127/80  Pulse: 88 83 91 87  Temp: 98.3 F (36.8 C)  98.2 F (36.8 C) 98.1 F (36.7 C)  TempSrc: Oral  Oral Oral  Resp: 20 18 20 16   Height:      Weight:      SpO2: 100% 99% 99% 99%   Physical Exam: Head: No herpetic lesions noted. Pulmonary: Normal respiratory expansion. Clear to auscultation bilaterally. No wheezing, rales, rhonchii.  Cardiac: Regular rate and rhythm. No murmurs, rubs, or gallops. Normal S1 and S2.   Derm: Round, raised, vesicles, mostly transitioning towards healing macules and scars in the R chest under the R breast and around to the back in a dermatomal distribution.  Lab Results: CSF from LP yest: RBC 6, WBC 7,  GS negative, crypto negative  CSF Analysis: RBCs 7, WBCs 45, Neutrophils 1, Lymphocytes 89, Appearance-clear, Color-colorless. Opening Pressure 25 cm H2O. Cryptococcus Ag (negative).  HIV RNA quantification 200,469  Micro Results: Recent Results (from the past 240 hour(s))  MRSA PCR SCREENING     Status: Normal   Collection Time   12/21/11  3:56 PM      Component Value Range Status Comment   MRSA by PCR NEGATIVE  NEGATIVE  Final   BODY FLUID CULTURE     Status: Normal (Preliminary result)   Collection Time   12/24/11  9:28 AM      Component Value Range Status Comment   Specimen Description CSF   Final    Special Requests TUBE 2 2.2CC   Final    Gram Stain     Final    Value: WBC PRESENT, PREDOMINANTLY MONONUCLEAR     NO ORGANISMS SEEN     Performed at [facility]   Culture NO GROWTH 2 DAYS   Final    Report Status PENDING   Incomplete   GRAM STAIN     Status: Normal   Collection Time   12/24/11  9:28 AM      Component Value Range Status Comment   Specimen Description CSF    Final    Special Requests TUBE 2 2.2CC   Final    Gram Stain     Final    Value: CYTOSPIN PREP     WBC PRESENT, PREDOMINANTLY MONONUCLEAR     NO ORGANISMS SEEN   Report Status 12/24/2011 FINAL   Final   BODY FLUID CULTURE     Status: Normal (Preliminary result)   Collection Time   12/26/11  5:40 PM      Component Value Range Status Comment   Specimen Description CSF   Final    Special Requests Immunocompromised   Final    Gram Stain     Final    Value: CYTOSPIN SLIDE WBC PRESENT, PREDOMINANTLY MONONUCLEAR     NO ORGANISMS SEEN     Gram Stain Report Called to,Read Back By and Verified With: Gram Stain Report Called to,Read Back By and Verified With: ELLER P RN 12/26/11 1858 BY JONESJ Performed at [facility]   Culture PENDING   Incomplete    Report Status PENDING   Incomplete   GRAM STAIN     Status: Normal   Collection Time   12/26/11  5:47  PM      Component Value Range Status Comment   Specimen Description CSF   Final    Special Requests NONE   Final    Gram Stain     Final    Value: WBC PRESENT, PREDOMINANTLY MONONUCLEAR     NO ORGANISMS SEEN     CYTOSPIN SLIDE     Gram Stain Report Called to,Read Back By and Verified With: ELLER P.,RN 12/26/11 1858 BY JONESJ   Report Status 12/26/2011 FINAL   Final    Studies/Results: Dg Fluoro Guide Lumbar Puncture (12/24/2011) FINDINGS: Using sterile technique and local anesthesia, a lumbar puncture was performed at the L3-4 level.  9 ml of clear CSF were obtained and sent for the requested laboratory studies.  The opening pressure was measured 25 cm of water.  The patient tolerated the procedure well.  IMPRESSION: Lumbar puncture performed with 9 ml of clear CSF obtained in sagittal question laboratory studies.  Original Report Authenticated By: Juline Patch, M.D.   Medications: I have reviewed the patient's current medications. Scheduled Meds:    . acyclovir  600 mg Intravenous Q8H  . azithromycin  1,200 mg Oral Weekly  .  darunavir  600 mg Oral BID WC  . emtricitabine-tenofovir  1 tablet Oral Daily  . enoxaparin  40 mg Subcutaneous Q24H  . lidocaine  10 mL Other Once  . LORazepam  1 mg Intravenous Once  . ondansetron  4 mg Intravenous Once  . pantoprazole  40 mg Oral Q1200  . ritonavir  100 mg Oral BID WC  . senna-docusate  2 tablet Oral BID  . sulfamethoxazole-trimethoprim  1 tablet Oral Daily  . DISCONTD: amphotericin B conventional (FUNGIZONE) IV  0.7 mg/kg (Ideal) Intravenous Q24H  . DISCONTD: flucytosine  1,500 mg Oral Q6H   Continuous Infusions:   PRN Meds:.gi cocktail, hydrocerin, HYDROcodone-acetaminophen, HYDROmorphone (DILAUDID) injection, LORazepam, ondansetron (ZOFRAN) IV, promethazine  Assessment/Plan: # Headache Was worse yesterday with vomiting x 1, prompting repeat LP.  Studies showed improving WBC in CSF, GS negative, crypto negative.  VZV from first LP came back positive.  Pt feels improved this a.m. after LP, no HA now.  Have been treating with acyclovir, so worsening HA/vomiting could be 2/2 meds, post LP HA, or other etiology. - f/u CSF studies - con't pain control -  IV acyclovir will be discontinued and she will start on valtrex 1 GM TID - f/u TB  # Shingles Much improved from admission, now resolving.  Still some residual tenderness, but controlled on norco. - norco 1-2 tabs q4h PRN - starting valtrex today  # HIV CD4 count and viral load on admission were 10 and 200,469 respectively. Have resumed ART, and OI prophylaxis. 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 This is a chronic problem. Currently the skin is escoriated, but is not red or inflamed.  - Eucerin Cream   # VTE Prophylaxis:  Lovenox   LOS: 6 days   Abner Greenspan, BEN 12/27/2011, 8:39 AM  Internal Medicine Teaching Service Attending Note Date: 12/27/2011  Patient name: [name]  Medical record number: [id number]  Date of birth: 1966/04/21    This patient has been seen and discussed with the house staff. Please  see their note for complete details. I concur with their findings with the following additions/corrections: Ms. Shanks is a 46 yo F with HIV/AIDS, CD 4 ct  10, VL 200,469 previously off of HAART presented with rash c/w disseminated zoster and HA. She has undergone 2 LP during this hospitalization. Initial CSF showed lymphocytic pleocytosis with WBC of 46, nl protein, nl glu. 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 2nd LP was done since patient had increasing headache +/- photophobia. Repeat LP shows improvement, now less has WBc(7). Since she is doing better and vesicular rash is now crusted over, we will switch her regimen to valtrex 1gm TID. Continue her on ART of boosted darunavir plus truvada, in addition to azithromycin and bactrim. If doing well today, will discharge home tomorrow.  Judyann Munson 12/27/2011, 1:31 PM

## 2011-12-27 NOTE — Progress Notes (Addendum)
Nutrition Follow-up  Diet Order:  Regular Patient states that she is feeling much better since her lumbar puncture on 2/24. Reports eating 100% of breakfast, appetite is better this morning.  Possible meningitis- awaiting puncture results.   Meds: Scheduled Meds:   . acyclovir  600 mg Intravenous Q8H  . azithromycin  1,200 mg Oral Weekly  . darunavir  600 mg Oral BID WC  . emtricitabine-tenofovir  1 tablet Oral Daily  . enoxaparin  40 mg Subcutaneous Q24H  . lidocaine  10 mL Other Once  . LORazepam  1 mg Intravenous Once  . ondansetron  4 mg Intravenous Once  . pantoprazole  40 mg Oral Q1200  . ritonavir  100 mg Oral BID WC  . senna-docusate  2 tablet Oral BID  . sulfamethoxazole-trimethoprim  1 tablet Oral Daily  . DISCONTD: amphotericin B conventional (FUNGIZONE) IV  0.7 mg/kg (Ideal) Intravenous Q24H  . DISCONTD: flucytosine  1,500 mg Oral Q6H   Continuous Infusions:  PRN Meds:.gi cocktail, hydrocerin, HYDROcodone-acetaminophen, HYDROmorphone (DILAUDID) injection, LORazepam, ondansetron (ZOFRAN) IV, promethazine  Labs:  CMP     Component Value Date/Time   NA 141 12/25/2011 0600   K 3.6 12/25/2011 0600   CL 106 12/25/2011 0600   CO2 26 12/25/2011 0600   GLUCOSE 95 12/25/2011 0600   BUN 8 12/25/2011 0600   CREATININE 0.96 12/25/2011 0600   CREATININE 0.90 04/19/2011 1108   CALCIUM 8.9 12/25/2011 0600   PROT 6.7 12/25/2011 0600   ALBUMIN 3.3* 12/25/2011 0600   AST 28 12/25/2011 0600   ALT 46* 12/25/2011 0600   ALKPHOS 84 12/25/2011 0600   BILITOT 0.2* 12/25/2011 0600   GFRNONAA 70* 12/25/2011 0600   GFRAA 81* 12/25/2011 0600     Intake/Output Summary (Last 24 hours) at 12/27/11 1027 Last data filed at 12/27/11 0900  Gross per 24 hour  Intake    352 ml  Output      0 ml  Net    352 ml    Weight Status:  No new weight obtained since admission  Re-estimated needs:  Unchanged, 1950-2150 kcal, 95-105 gm protein  Nutrition Dx:  Predicted sub-optimal intake, ongoing  Goal:  PO  intake will meet >90% of estimated nutrition needs, progressing  Intervention:   RD encouraged patient to eat as able If patient appetite does not continue to improve, will re-assess need for supplements Patient requests a multivitamin, states she takes only at home-RD will add  Monitor:  PO intake, new weights, labs, I/O's   Rudean Haskell Pager #:  [phone]

## 2011-12-28 LAB — QUANTIFERON TB GOLD ASSAY (BLOOD): Interferon Gamma Release Assay: NEGATIVE

## 2011-12-28 MED ORDER — ZOLPIDEM TARTRATE 5 MG PO TABS: 5.0000 mg | ORAL_TABLET | Freq: Every evening | ORAL | Status: DC | PRN

## 2011-12-28 MED ORDER — EMTRICITABINE-TENOFOVIR DF 200-300 MG PO TABS: 1.0000 | ORAL_TABLET | Freq: Every day | ORAL | Status: DC

## 2011-12-28 MED ORDER — SULFAMETHOXAZOLE-TRIMETHOPRIM 400-80 MG PO TABS: 1.0000 | ORAL_TABLET | Freq: Every day | ORAL | Status: DC

## 2011-12-28 MED ORDER — RITONAVIR 100 MG PO TABS: 100.0000 mg | ORAL_TABLET | Freq: Two times a day (BID) | ORAL | Status: DC

## 2011-12-28 MED ORDER — VALACYCLOVIR HCL 1 G PO TABS: 1000.0000 mg | ORAL_TABLET | Freq: Three times a day (TID) | ORAL | Status: DC

## 2011-12-28 MED ORDER — HYDROCODONE-ACETAMINOPHEN 5-325 MG PO TABS: 1.0000 | ORAL_TABLET | Freq: Four times a day (QID) | ORAL | Status: AC | PRN

## 2011-12-28 MED ORDER — AZITHROMYCIN 600 MG PO TABS: 1200.0000 mg | ORAL_TABLET | ORAL | Status: DC

## 2011-12-28 MED ORDER — DARUNAVIR ETHANOLATE 600 MG PO TABS: 600.0000 mg | ORAL_TABLET | Freq: Two times a day (BID) | ORAL | Status: DC

## 2011-12-28 NOTE — Discharge Summary (Signed)
Internal Medicine Teaching [facility] Discharge Note  Name: [name] MRN: [id number] DOB: 04-29-1966 45 y.o.  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 For cough      azithromycin 600 MG tablet   Commonly known as: ZITHROMAX   Take 2 tablets (1,200 mg total) by mouth once a week.      darunavir 600 MG tablet   Commonly known as: PREZISTA   Take 1 tablet (600 mg total) by mouth 2 (two) times daily with a meal.      emtricitabine-tenofovir 200-300 MG per tablet   Commonly known as: TRUVADA   Take 1 tablet by mouth daily.      HYDROcodone-acetaminophen 5-325 MG per tablet   Commonly known as: NORCO   Take 1-2 tablets by mouth every 6 (six) hours as needed.      ibuprofen 200 MG tablet   Commonly known as: ADVIL,MOTRIN   Take 200 mg by mouth every 6 (six) hours as needed. For pain      ranitidine 75 MG tablet   Commonly known as: ZANTAC   Take 75-150 mg by mouth daily.      ritonavir 100 MG Tabs   Commonly known as: NORVIR   Take 1 tablet (100 mg total) by mouth 2 (two) times daily with a meal.      sulfamethoxazole-trimethoprim 400-80 MG per tablet   Commonly known as: BACTRIM,SEPTRA   Take 1 tablet by mouth daily.      valACYclovir 1000 MG tablet   Commonly known as: VALTREX   Take 1 tablet (1,000 mg total) by mouth 3 (three) times daily.      zolpidem 5 MG tablet   Commonly known as: AMBIEN   Take 1 tablet (5 mg total) by mouth at bedtime as needed for sleep.            Disposition and follow-up:    [name] was discharged from [facility] in Stable condition.    F/U: Pt is trying to become more compliant with HIV meds.  She should have completed a course of valtrex for VZV meningitis and R chest shingles.  She will need routine HIV monitoring and care.  Consider case manager?  Follow-up Appointments: Follow-up Information    Follow up with Kelly Lav, MD on 01/10/2012. (11:15am)    Contact information:   [address] [place] [address] [phone]         Discharge Orders    Future Appointments: Provider: Department: Dept Phone: Center:   01/10/2012 11:15 AM Kelly Lav, MD [facility] [phone] [facility]     Future Orders Please Complete By Expires   Diet - low sodium heart healthy      Increase activity slowly         Consultations:  none  Procedures Performed:  Dg Chest 2 View  12/22/2011  *RADIOLOGY REPORT*  Clinical Data:  Cough and fever  CHEST - 2 VIEW  Comparison: 09/24/2004  Findings: Heart size is upper normal.  Vascularity is normal. Lungs are clear without infiltrate or effusion.  Negative for mass lesion.  No bony abnormality in the thoracic spine.  IMPRESSION: Negative  Original Report Authenticated By: Camelia Phenes, M.D.   Ct Head W Wo Contrast  12/23/2011  *RADIOLOGY REPORT*  Clinical Data: Headaches.  Severely compromised AIDS patient.  CT HEAD WITHOUT AND WITH CONTRAST  Technique:  Contiguous axial images were obtained from the base of the skull through the vertex without and with intravenous contrast.  Contrast: 80mL OMNIPAQUE IOHEXOL 300 MG/ML IV SOLN  Comparison: None.  Findings: Motion degraded examination without obvious intracranial hemorrhage or CT evidence of large acute infarct.  No hydrocephalus.  No intracranial enhancing lesion.  Cerebellar tonsils minimally low-lying.  Mastoid air cells, middle ear cavities and visualized sinuses are clear with exception  of a small polyp medial aspect right sphenoid sinus.  IMPRESSION: Motion degraded examination without evidence of intracranial hemorrhage or mass.  Cerebellar tonsils minimally low-lying.  Original Report Authenticated By: Fuller Canada, M.D.   Dg Fluoro Guide Lumbar Puncture  12/26/2011  *RADIOLOGY REPORT*  Clinical Data:  Increased intracranial pressure and nuchal rigidity  DIAGNOSTIC LUMBAR PUNCTURE UNDER FLUOROSCOPIC GUIDANCE  Fluoroscopy time:  0.17 minutes.  Technique:  Informed consent was obtained from the patient prior to the procedure, including potential complications of headache, allergy, and pain.   With the patient prone, the lower back was prepped with Betadine.  1% Lidocaine was used for local anesthesia. Lumbar puncture was performed at the L2-L3 level using a 22 gauge needle with return of clear CSF with an opening pressure of 11 cm water.   9 ml of CSF were obtained for laboratory studies.  The patient tolerated the procedure well and there were no apparent complications.  Please note that the lumber pressure was initially attempted at L3- L4 but blood was identified in the needle prior to advancement into the CSF.  This blood could be related to a vein or a small hematoma in the subcutaneous tissues related to prior lumbar puncture attempts.  IMPRESSION: Successful fluoroscopic guided lumbar puncture.  Opening pressure was 11 cm.  Original Report Authenticated By: Richarda Overlie, M.D.   Dg Fluoro Guide Lumbar Puncture  12/24/2011  *RADIOLOGY REPORT*  Clinical Data: Chronic headaches  LUMBAR PUNCTURE FLUORO GUIDE  Comparison: None.  Findings: Using sterile technique and local anesthesia, a lumbar puncture was performed at the L3-4 level.  9 ml of clear CSF were obtained and sent for the requested laboratory studies.  The opening pressure was measured 25 cm of water.  The patient tolerated the procedure well.  IMPRESSION: Lumbar puncture performed with 9 ml of clear CSF obtained in sagittal  question laboratory studies.  Original Report Authenticated By: Juline Patch, M.D.    Admission HPI:  [name] is a 46 year old lady with PMH significant for HIV (last CD4 28 on 01/20/11 and HIV viral load of 79,100 on 04/19/11) who presents presents from clinic with a painful rash that tracks under her right breast around to her back. She first noticed her back itching last Friday. Yesterday, she noticed a painful raised rash, that has progressively gotten worse. Patient has previously had shingles in 2000 along the left side of her chest that felt similar. She does not remember how that episode was treated. Patient says she has taken Valtrex in the past  without any side effects.   Patient was diagnosed with HIV in 04-09-04. She remembers being on "a white pill", "two orange pills", and truvada. However, she has never been compliant with her medications. Patient endorses an occasional cough productive of light green sputum. She has intermittent hot flashes, but no overt fevers. Patient endorses chronic loose stools without blood or melena. Patient also endorsing stabbing headache along the right side of her head. The headache improves when she lies down. It is not associated with photophobia or neck rigidity. Patient has lost 5 pounds since her last visit with Dr. Daiva Eves. She says her appetite has been decreased lately. Patient denies any rashes, but endorses pruritis between abdominal folds.    Hospital Course by problem list: # Headache  Pt presented with worsening HA amidst low CD4 count and observed R chest shingles.  Concern for possible meningitis led to LP, which showed mildly elevated opening prsesure, CSF leukocytes of 45, otherwise sterile.  C/w fungal vs viral meningitis.  Initial studies were negative, and the pt's HA was intermittent.  Four days into admission, pt had vomiting with worsening HA, so LP was repeated, showing resolution of white cells to 7.  Shortly after 2nd LP, VZV PCR from  1st LP came back positive.  Pt had already been started on IV acyclovir and eventually had improvement in her sx.  Was transitioned to oral valtrex and was dc'ed with 9 more days of antivirals for a total of 16 days.  Antiviral course was extended b/c of unusual meningitis course.  By the time of d/c she had no HA and was feeling excellent.  # Shingles  Presented with classic dermatomal distribution of R sided shingles on the chest and back.  Significantly improved and resolved with first IV acyclovir and eventually valtrex.  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  # HIV  CD4 count and viral load on admission were 10 and 200,469 respectively.  Pt was noncompliant with meds and openly discussed difficulty adhering b/c of social stress, particularly the death of her son in 04-09-10.  She is eager to get back in a groove with taking her meds, and it seems she might benefit from a case Production designer, theatre/television/film.  She was restarted on ART and OI ppx: - Azithromycin 1200mg  po Weekly (First dose Tuesday, 12/20/10)  - Trimethoprim/Sulfamethoxazole 80/400 1 tab daily  - Truvada 200-300, 1 tab daily  - Norvir 100mg  po bid  - Prezista 600mg  po bid   # Rash in abdominal fold  Patient had chronic itching along an old c-section scar. This is a chronic problem. Skin was escoriated, but was not red or inflamed, and was treated with Eucerin Cream.  Discharge Vitals:  BP 131/88  Pulse 76  Temp(Src) 98.2 F (36.8 C) (Oral)  Resp 20  Ht 5\' 6"  (1.676 m)  Wt 189 lb 11.2 oz (86.047 kg)  BMI 30.62 kg/m2  SpO2 97%  Discharge Labs: No results found for this or any previous visit (from the past 24 hour(s)).  SignedAbner Greenspan, BEN 12/28/2011, 12:02 PM

## 2011-12-28 NOTE — Progress Notes (Signed)
CSW received referral d/t to pt's HIV infected status. Pt is active with ID clinic and has been for 8 years. Pt and CSW discussed recent hospitalization issues and pt's plans for f/u with clinic. Pt describes a strong rapport with ID clinic and admits that she struggles with taking her medications as prescribed by choice. Pt hopes to be more disciplined in taking medications at dc.  CSW suggested talking to social worker in ID clinic to possibly get pt a Programmer, systems for psychosocial support with disease management. Pt was appreciative of CSW call and is looking forward to discharging today. Frederico Hamman, LCSW

## 2011-12-28 NOTE — Progress Notes (Signed)
Subjective: No events overnight.  Pt feels ready for d/c.  Objective: Vital signs in last 24 hours: Filed Vitals:   12/27/11 0548 12/27/11 1305 12/27/11 2137 12/28/11 0603  BP: 127/80 124/85 131/86 131/88  Pulse: 87 80 83 76  Temp: 98.1 F (36.7 C) 98.9 F (37.2 C) 98 F (36.7 C) 98.2 F (36.8 C)  TempSrc: Oral Oral Oral Oral  Resp: 16 20 22 20   Height:      Weight:      SpO2: 99% 97% 98% 97%   Physical Exam: Head: No herpetic lesions noted. Pulmonary: Normal respiratory expansion. Clear to auscultation bilaterally. No wheezing, rales, rhonchii.  Cardiac: Regular rate and rhythm. No murmurs, rubs, or gallops. Normal S1 and S2.   Derm: Round, raised, vesicles, mostly transitioning towards healing macules and scars in the R chest under the R breast and around to the back in a dermatomal distribution.  Lab Results: CSF from LP yest: RBC 6, WBC 7,  GS negative, crypto negative  CSF Analysis: RBCs 7, WBCs 45, Neutrophils 1, Lymphocytes 89, Appearance-clear, Color-colorless. Opening Pressure 25 cm H2O. Cryptococcus Ag (negative).  HIV RNA quantification 200,469  Micro Results: Recent Results (from the past 240 hour(s))  MRSA PCR SCREENING     Status: Normal   Collection Time   12/21/11  3:56 PM      Component Value Range Status Comment   MRSA by PCR NEGATIVE  NEGATIVE  Final   BODY FLUID CULTURE     Status: Normal   Collection Time   12/24/11  9:28 AM      Component Value Range Status Comment   Specimen Description CSF   Final    Special Requests TUBE 2 2.2CC   Final    Gram Stain     Final    Value: WBC PRESENT, PREDOMINANTLY MONONUCLEAR     NO ORGANISMS SEEN     Performed at [facility]   Culture NO GROWTH 3 DAYS   Final    Report Status 12/27/2011 FINAL   Final   GRAM STAIN     Status: Normal   Collection Time   12/24/11  9:28 AM      Component Value Range Status Comment   Specimen Description CSF   Final    Special Requests TUBE 2 2.2CC   Final    Gram  Stain     Final    Value: CYTOSPIN PREP     WBC PRESENT, PREDOMINANTLY MONONUCLEAR     NO ORGANISMS SEEN   Report Status 12/24/2011 FINAL   Final   BODY FLUID CULTURE     Status: Normal (Preliminary result)   Collection Time   12/26/11  5:40 PM      Component Value Range Status Comment   Specimen Description CSF   Final    Special Requests Immunocompromised   Final    Gram Stain     Final    Value: CYTOSPIN SLIDE WBC PRESENT, PREDOMINANTLY MONONUCLEAR     NO ORGANISMS SEEN     Gram Stain Report Called to,Read Back By and Verified With: Gram Stain Report Called to,Read Back By and Verified With: ELLER P RN 12/26/11 1858 BY JONESJ Performed at [facility]   Culture NO GROWTH   Final    Report Status PENDING   Incomplete   GRAM STAIN     Status: Normal   Collection Time   12/26/11  5:47 PM      Component Value Range  Status Comment   Specimen Description CSF   Final    Special Requests NONE   Final    Gram Stain     Final    Value: WBC PRESENT, PREDOMINANTLY MONONUCLEAR     NO ORGANISMS SEEN     CYTOSPIN SLIDE     Gram Stain Report Called to,Read Back By and Verified With: ELLER P.,RN 12/26/11 1858 BY JONESJ   Report Status 12/26/2011 FINAL   Final    Studies/Results: Dg Fluoro Guide Lumbar Puncture (12/24/2011) FINDINGS: Using sterile technique and local anesthesia, a lumbar puncture was performed at the L3-4 level.  9 ml of clear CSF were obtained and sent for the requested laboratory studies.  The opening pressure was measured 25 cm of water.  The patient tolerated the procedure well.  IMPRESSION: Lumbar puncture performed with 9 ml of clear CSF obtained in sagittal question laboratory studies.  Original Report Authenticated By: Juline Patch, M.D.   Medications: I have reviewed the patient's current medications. Scheduled Meds:    . azithromycin  1,200 mg Oral Weekly  . darunavir  600 mg Oral BID WC  . emtricitabine-tenofovir  1 tablet Oral Daily  . enoxaparin  40 mg  Subcutaneous Q24H  . mulitivitamin with minerals  1 tablet Oral Daily  . pantoprazole  40 mg Oral Q1200  . ritonavir  100 mg Oral BID WC  . senna-docusate  2 tablet Oral BID  . sulfamethoxazole-trimethoprim  1 tablet Oral Daily  . valACYclovir  1,000 mg Oral TID  . 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  Still some residual tenderness, but controlled on norco. - norco 1-2 tabs q4h PRN - con't valtrex  # HIV CD4 count and viral load on admission were 10 and 200,469 respectively. Have resumed ART, and OI prophylaxis. 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 This is a chronic problem. Currently the skin is escoriated, but is not red or inflamed.  - Eucerin Cream   # VTE Prophylaxis: Lovenox   LOS: 7 days   WILDMAN-TOBRINER, BEN 12/28/2011, 10:21 AM

## 2011-12-28 NOTE — Progress Notes (Signed)
Patient discharged to home with family, discharged instructions given and reviewed with patient.  Patient verbalized understanding, care notes given for new meds and pertinent education. Skin intact at discharge, but still has dry shingle rash to right side & back.  Patient escorted to car via wheelchair by NS/MT.  Forbes Cellar, RN

## 2011-12-28 NOTE — Procedures (Signed)
PROCEDURE: Lumbar Puncture.  INDICATION: Worsening nausea and vomiting. Raised ICP with coughing and worsening headache  PROCEDURE OPERATOR: Lars Mage, MD; Supervised by Dr. Josem Kaufmann  CONSENT: Obtained from the patient  PROCEDURE SUMMARY: A time-out was performed. The patient was placed in the right  lateral decubitus position in a semi-fetal position with help from the nursing staff. The area was cleansed and draped in usual sterile fashion. Anesthesia was achieved with 1% lidocaine. A 20-gauge 3.5-inch spinal needle was placed in the L4-L5 interspace. After 2 attempts, we could not get to the spinal fluid. We decided to send the patient for a fluoro guided LP.  The patient had no immediate complications and tolerated the procedure well. Dr. Josem Kaufmann was present during the entire procedure.  ESTIMATED BLOOD LOSS: <5cc

## 2011-12-29 ENCOUNTER — Other Ambulatory Visit: Payer: Self-pay | Admitting: *Deleted

## 2011-12-29 DIAGNOSIS — R110 Nausea: Secondary | ICD-10-CM

## 2011-12-29 MED ORDER — ONDANSETRON HCL 4 MG PO TABS: 4.0000 mg | ORAL_TABLET | Freq: Four times a day (QID) | ORAL | Status: DC | PRN

## 2011-12-29 NOTE — Discharge Summary (Signed)
Internal Medicine Teaching Service Attending Note Date: 12/29/2011  Patient name: [name]  Medical record number: [id number]  Date of birth: [birth year]    This patient has been seen and discussed with the house staff. Please see their note for complete details. I concur with their findings and discharge plan as outlined by Dr. Abner Greenspan.  Judyann Munson 12/29/2011, 4:33 PM

## 2011-12-30 ENCOUNTER — Other Ambulatory Visit: Payer: Self-pay | Admitting: Licensed Clinical Social Worker

## 2011-12-30 DIAGNOSIS — R110 Nausea: Secondary | ICD-10-CM

## 2011-12-30 LAB — BODY FLUID CULTURE

## 2011-12-30 MED ORDER — PROMETHAZINE HCL 25 MG PO TABS: 25.0000 mg | ORAL_TABLET | Freq: Four times a day (QID) | ORAL | Status: DC | PRN

## 2011-12-30 NOTE — Telephone Encounter (Signed)
Patient was sent home with a script for zofran and it was not covered by Medicare. She wanted to keep taking the Promethazine that she used to take before her hospital admission. I saw that it was on her history of medications prior to her admission on and taking off on 12/15/2011. I called the promethazine in to her pharmacy and it is covered by her insurance.

## 2012-01-03 ENCOUNTER — Telehealth: Payer: Self-pay | Admitting: *Deleted

## 2012-01-03 NOTE — Telephone Encounter (Signed)
States the zantac she has on her med list does not help. Wants what she got when she was in the hospital. I told her I would check with md & let her know when done

## 2012-01-05 MED ORDER — PANTOPRAZOLE SODIUM 40 MG PO PACK: 40.0000 mg | PACK | Freq: Every day | ORAL | Status: DC

## 2012-01-05 NOTE — Telephone Encounter (Signed)
Wrote for protonix

## 2012-01-10 ENCOUNTER — Ambulatory Visit (INDEPENDENT_AMBULATORY_CARE_PROVIDER_SITE_OTHER): Payer: Medicare Other | Admitting: Infectious Disease

## 2012-01-10 ENCOUNTER — Encounter: Payer: Self-pay | Admitting: Infectious Disease

## 2012-01-10 VITALS — BP 125/82 | HR 81 | Temp 97.5°F | Wt 192.0 lb

## 2012-01-10 DIAGNOSIS — B029 Zoster without complications: Secondary | ICD-10-CM

## 2012-01-10 DIAGNOSIS — R51 Headache: Secondary | ICD-10-CM

## 2012-01-10 DIAGNOSIS — B20 Human immunodeficiency virus [HIV] disease: Secondary | ICD-10-CM

## 2012-01-10 DIAGNOSIS — Z113 Encounter for screening for infections with a predominantly sexual mode of transmission: Secondary | ICD-10-CM

## 2012-01-10 DIAGNOSIS — F0630 Mood disorder due to known physiological condition, unspecified: Secondary | ICD-10-CM

## 2012-01-10 DIAGNOSIS — B009 Herpesviral infection, unspecified: Secondary | ICD-10-CM

## 2012-01-10 DIAGNOSIS — G4700 Insomnia, unspecified: Secondary | ICD-10-CM

## 2012-01-10 DIAGNOSIS — G971 Other reaction to spinal and lumbar puncture: Secondary | ICD-10-CM

## 2012-01-10 DIAGNOSIS — K219 Gastro-esophageal reflux disease without esophagitis: Secondary | ICD-10-CM

## 2012-01-10 LAB — CBC WITH DIFFERENTIAL/PLATELET
Basophils Absolute: 0 10*3/uL (ref 0.0–0.1)
Basophils Relative: 1 % (ref 0–1)
HCT: 39.2 % (ref 36.0–46.0)
Hemoglobin: 12.5 g/dL (ref 12.0–15.0)
Lymphocytes Relative: 37 % (ref 12–46)
MCHC: 31.9 g/dL (ref 30.0–36.0)
Monocytes Absolute: 0.5 10*3/uL (ref 0.1–1.0)
Neutro Abs: 2.4 10*3/uL (ref 1.7–7.7)
Neutrophils Relative %: 41 % — ABNORMAL LOW (ref 43–77)
RDW: 14.2 % (ref 11.5–15.5)
WBC: 6 10*3/uL (ref 4.0–10.5)

## 2012-01-10 LAB — COMPLETE METABOLIC PANEL WITH GFR
ALT: 38 U/L — ABNORMAL HIGH (ref 0–35)
AST: 22 U/L (ref 0–37)
BUN: 10 mg/dL (ref 6–23)
Creat: 0.89 mg/dL (ref 0.50–1.10)
GFR, Est African American: >89 mL/min
Total Bilirubin: 0.3 mg/dL (ref 0.3–1.2)

## 2012-01-10 MED ORDER — ZOLPIDEM TARTRATE 5 MG PO TABS: 5.0000 mg | ORAL_TABLET | Freq: Three times a day (TID) | ORAL | Status: DC | PRN

## 2012-01-10 MED ORDER — RITONAVIR 100 MG PO TABS: 100.0000 mg | ORAL_TABLET | Freq: Every day | ORAL | Status: DC

## 2012-01-10 MED ORDER — OMEPRAZOLE 20 MG PO CPDR: 20.0000 mg | DELAYED_RELEASE_CAPSULE | Freq: Two times a day (BID) | ORAL | Status: DC

## 2012-01-10 MED ORDER — DARUNAVIR ETHANOLATE 800 MG PO TABS: 800.0000 mg | ORAL_TABLET | Freq: Every day | ORAL | Status: DC

## 2012-01-10 MED ORDER — HYDROCODONE-ACETAMINOPHEN 5-500 MG PO TABS: 1.0000 | ORAL_TABLET | Freq: Three times a day (TID) | ORAL | Status: DC | PRN

## 2012-01-10 MED ORDER — VALACYCLOVIR HCL 1 G PO TABS: 1000.0000 mg | ORAL_TABLET | Freq: Every day | ORAL | Status: DC

## 2012-01-10 MED ORDER — HYDROCODONE-ACETAMINOPHEN 5-500 MG PO TABS: 2.0000 | ORAL_TABLET | Freq: Four times a day (QID) | ORAL | Status: DC | PRN

## 2012-01-10 MED ORDER — ZOLPIDEM TARTRATE 5 MG PO TABS: 5.0000 mg | ORAL_TABLET | Freq: Every evening | ORAL | Status: DC | PRN

## 2012-01-10 NOTE — Assessment & Plan Note (Signed)
She will see [facility] counseloer. I am filling her ambien. Consider other adjuntive agents such as SSRI

## 2012-01-10 NOTE — Assessment & Plan Note (Signed)
Recheck labs today continue boosted prezista 800mg  with norvir and truvada, OI prophylaxis

## 2012-01-10 NOTE — Assessment & Plan Note (Signed)
Post spinal headache. I will give her vicodin for this and for her VZV pain (also will help with contstipation_)

## 2012-01-10 NOTE — Patient Instructions (Signed)
Sharunda your new regimen as prescribed by Dr. Daiva Eves is as follows:  Prezista 800mg  ONE ORANGE PILL DAILY  WITH  NORVIR 100MG  TABLET (ONE WHITE PILL DAILY  WITH  TRUVADA ONE BLUE PILL DAILY  PLEASE CONTINUE TO TAKE THE OTHER MEDICINES AS PRESCRIBED  RTC FOR LABS IN A MONTH AND VISIT WITH DR. VAN DAM IN MAY  I ALSO WOULD LIKE [facility]IDGE COUNSELOR INVOLVMENT

## 2012-01-10 NOTE — Assessment & Plan Note (Signed)
With VZV meningitis. Finished 21 days, will change to prophylactic dose of 1 g daily

## 2012-01-10 NOTE — Progress Notes (Signed)
Subjective:    Patient ID: [name], female    DOB: November 18, 1965, 46 y.o.   MRN: [id number]  HPI  46 year old [place] American lady with HIV/AIDS intermittently compliant with ARV recently admitted to B service with disseminated zoster and meningitis. She had been off ARVs and these were resumed in house. She returns for followup visit after stay in the hospital. She is having what sounds like post spinal LP headache with left sided headaches more prominent with standing and relieved by lying down. SHe also is contiuing to have pain alogn VZV eruption site. She has some nausea and loose stools with her ARVs. She is now committed to taking her ARV more religiously. She tells me that by month 4 she gets ssx of abdominal pain and cramping in her arms. I told her this could be IRIS and that we could consider options including steriods to get her thru those ssx vs sympttomatic rx. She is still going thru cycles of depression gin particular with reminders of her sons death. She is tearful during interview. I spent greater than 45 minutes with the patient including greater than 50% of time in face to face counsel of the patient and in coordination of their care.  Review of Systems  Constitutional: Negative for fever, chills, diaphoresis, activity change, appetite change, fatigue and unexpected weight change.  HENT: Negative for congestion, sore throat, rhinorrhea, sneezing, trouble swallowing and sinus pressure.   Eyes: Negative for photophobia and visual disturbance.  Respiratory: Negative for cough, chest tightness, shortness of breath, wheezing and stridor.   Cardiovascular: Negative for chest pain, palpitations and leg swelling.  Gastrointestinal: Negative for nausea, vomiting, abdominal pain, diarrhea, constipation, blood in stool, abdominal distention and anal bleeding.  Genitourinary: Negative for dysuria, hematuria, flank pain and difficulty urinating.  Musculoskeletal: Negative for myalgias,  back pain, joint swelling, arthralgias and gait problem.  Skin: Positive for rash. Negative for color change, pallor and wound.  Neurological: Negative for dizziness, tremors, weakness and light-headedness.  Hematological: Negative for adenopathy. Does not bruise/bleed easily.  Psychiatric/Behavioral: Negative for behavioral problems, confusion, sleep disturbance, dysphoric mood, decreased concentration and agitation.       Objective:   Physical Exam  Constitutional: She is oriented to person, place, and time. She appears well-developed and well-nourished. No distress.  HENT:  Head: Normocephalic and atraumatic.  Mouth/Throat: Oropharynx is clear and moist. No oropharyngeal exudate.  Eyes: Conjunctivae and EOM are normal. Pupils are equal, round, and reactive to light. No scleral icterus.  Neck: Normal range of motion. Neck supple. No JVD present.  Cardiovascular: Normal rate, regular rhythm and normal heart sounds.  Exam reveals no gallop and no friction rub.   No murmur heard. Pulmonary/Chest: Effort normal and breath sounds normal. No respiratory distress. She has no wheezes. She has no rales. She exhibits no tenderness.  Abdominal: She exhibits no distension and no mass. There is no tenderness. There is no rebound and no guarding.  Musculoskeletal: She exhibits no edema and no tenderness.  Lymphadenopathy:    She has no cervical adenopathy.  Neurological: She is alert and oriented to person, place, and time. She has normal reflexes. She exhibits normal muscle tone. Coordination normal.  Skin: Skin is warm and dry. She is not diaphoretic. No erythema. No pallor.     Psychiatric: Her behavior is normal. Judgment and thought content normal. She exhibits a depressed mood.          Assessment & Plan:  HIV DISEASE Recheck labs  today continue boosted prezista 800mg  with norvir and truvada, OI prophylaxis  Shingles With VZV meningitis. Finished 21 days, will change to prophylactic  dose of 1 g daily  Headache Post spinal headache. I will give her vicodin for this and for her VZV pain (also will help with contstipation_)  MOOD DISORDER IN CONDITIONS CLASSIFIED ELSEWHERE She will see [facility] counseloer. I am filling her ambien. Consider other adjuntive agents such as SSRI

## 2012-01-11 LAB — T-HELPER CELL (CD4) - (RCID CLINIC ONLY)
CD4 % Helper T Cell: 1 % — ABNORMAL LOW (ref 33–55)
CD4 T Cell Abs: 30 uL — ABNORMAL LOW (ref 400–2700)

## 2012-01-12 LAB — HIV-1 RNA QUANT-NO REFLEX-BLD
HIV 1 RNA Quant: 1225 copies/mL — ABNORMAL HIGH (ref <20)
HIV-1 RNA Quant, Log: 3.09 {Log} — ABNORMAL HIGH (ref <1.30)

## 2012-02-03 ENCOUNTER — Other Ambulatory Visit: Payer: Self-pay | Admitting: Infectious Disease

## 2012-02-03 DIAGNOSIS — B20 Human immunodeficiency virus [HIV] disease: Secondary | ICD-10-CM

## 2012-02-28 ENCOUNTER — Other Ambulatory Visit: Payer: Medicare Other

## 2012-02-29 ENCOUNTER — Other Ambulatory Visit: Payer: Medicare Other

## 2012-02-29 ENCOUNTER — Other Ambulatory Visit: Payer: Self-pay | Admitting: Licensed Clinical Social Worker

## 2012-02-29 DIAGNOSIS — G971 Other reaction to spinal and lumbar puncture: Secondary | ICD-10-CM

## 2012-02-29 DIAGNOSIS — B20 Human immunodeficiency virus [HIV] disease: Secondary | ICD-10-CM

## 2012-02-29 LAB — COMPLETE METABOLIC PANEL WITH GFR
AST: 21 U/L (ref 0–37)
Albumin: 4.4 g/dL (ref 3.5–5.2)
Alkaline Phosphatase: 102 U/L (ref 39–117)
BUN: 11 mg/dL (ref 6–23)
Creat: 0.87 mg/dL (ref 0.50–1.10)
GFR, Est Non African American: 80 mL/min
Glucose, Bld: 87 mg/dL (ref 70–99)
Potassium: 4.3 mEq/L (ref 3.5–5.3)
Total Bilirubin: 0.3 mg/dL (ref 0.3–1.2)

## 2012-02-29 LAB — CBC WITH DIFFERENTIAL/PLATELET
Basophils Absolute: 0.1 10*3/uL (ref 0.0–0.1)
Eosinophils Relative: 11 % — ABNORMAL HIGH (ref 0–5)
HCT: 38.2 % (ref 36.0–46.0)
Hemoglobin: 12.3 g/dL (ref 12.0–15.0)
Lymphocytes Relative: 50 % — ABNORMAL HIGH (ref 12–46)
Lymphs Abs: 2.9 10*3/uL (ref 0.7–4.0)
MCV: 89.3 fL (ref 78.0–100.0)
Monocytes Absolute: 0.4 10*3/uL (ref 0.1–1.0)
Neutro Abs: 1.9 10*3/uL (ref 1.7–7.7)
RBC: 4.28 MIL/uL (ref 3.87–5.11)
RDW: 15 % (ref 11.5–15.5)
WBC: 5.8 10*3/uL (ref 4.0–10.5)

## 2012-02-29 MED ORDER — HYDROCODONE-ACETAMINOPHEN 5-500 MG PO TABS: 1.0000 | ORAL_TABLET | Freq: Three times a day (TID) | ORAL | Status: DC | PRN

## 2012-03-01 LAB — T-HELPER CELL (CD4) - (RCID CLINIC ONLY): CD4 % Helper T Cell: 2 % — ABNORMAL LOW (ref 33–55)

## 2012-03-02 LAB — HIV-1 RNA QUANT-NO REFLEX-BLD: HIV 1 RNA Quant: 194 copies/mL — ABNORMAL HIGH (ref <20)

## 2012-03-13 ENCOUNTER — Encounter: Payer: Self-pay | Admitting: Infectious Disease

## 2012-03-13 ENCOUNTER — Ambulatory Visit (INDEPENDENT_AMBULATORY_CARE_PROVIDER_SITE_OTHER): Payer: Medicare Other | Admitting: Infectious Disease

## 2012-03-13 VITALS — BP 129/88 | HR 87 | Temp 97.6°F | Wt 198.0 lb

## 2012-03-13 DIAGNOSIS — B20 Human immunodeficiency virus [HIV] disease: Secondary | ICD-10-CM

## 2012-03-13 DIAGNOSIS — B029 Zoster without complications: Secondary | ICD-10-CM

## 2012-03-13 DIAGNOSIS — R61 Generalized hyperhidrosis: Secondary | ICD-10-CM

## 2012-03-13 NOTE — Assessment & Plan Note (Signed)
Excellent job continue her Prezista Norvir and Truvada continue PCP and Mycobacterium avium prophylaxis continue Valtrex

## 2012-03-13 NOTE — Assessment & Plan Note (Signed)
Continue Valtrex 

## 2012-03-13 NOTE — Assessment & Plan Note (Signed)
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

## 2012-03-13 NOTE — Patient Instructions (Signed)
She should have her blood work done towards end of this month and then followup with me in roughly a month

## 2012-03-13 NOTE — Progress Notes (Signed)
  Subjective:    Patient ID: [name], female    DOB: [birth year], 46 y.o.   MRN: [id number]  HPI  46 year old [place] lady with HIV/AIDS intermittently compliant with ARV admitted to B service in February with disseminated zoster and meningitis. She had been off ARVs and these were resumed in house. Her viral load has dropped appropriately now until 100 range. Her CD4 count has increased from 10 although at 50 now. She appears to be suffering from what are likely symptoms consistent with immune reconstitution inflammatory syndrome with some night sweats that are bothering her. She is concerned as is her menopausal symptoms but she had a hysterectomy in 1990s which is certainly remote. I explained the nature of immune reconstitution inflammatory syndrome to the patient and she would to get through this. She also is bothered that she gained weight I again explained that the virus is catabolic and when we  control it can increase a patients weight  Review of Systems  Constitutional: Positive for diaphoresis and unexpected weight change. Negative for fever, chills, activity change, appetite change and fatigue.  HENT: Negative for congestion, sore throat, rhinorrhea, sneezing, trouble swallowing and sinus pressure.   Eyes: Negative for photophobia and visual disturbance.  Respiratory: Negative for cough, chest tightness, shortness of breath, wheezing and stridor.   Cardiovascular: Negative for chest pain, palpitations and leg swelling.  Gastrointestinal: Negative for nausea, vomiting, abdominal pain, diarrhea, constipation, blood in stool, abdominal distention and anal bleeding.  Genitourinary: Negative for dysuria, hematuria, flank pain and difficulty urinating.  Musculoskeletal: Negative for myalgias, back pain, joint swelling, arthralgias and gait problem.  Skin: Negative for color change, pallor, rash and wound.  Neurological: Negative for dizziness, tremors, weakness and  light-headedness.  Hematological: Negative for adenopathy. Does not bruise/bleed easily.  Psychiatric/Behavioral: Negative for behavioral problems, confusion, sleep disturbance, dysphoric mood, decreased concentration and agitation.       Objective:   Physical Exam  Constitutional: She is oriented to person, place, and time. She appears well-developed and well-nourished. No distress.  HENT:  Head: Normocephalic and atraumatic.  Mouth/Throat: Oropharynx is clear and moist. No oropharyngeal exudate.  Eyes: Conjunctivae and EOM are normal. Pupils are equal, round, and reactive to light. No scleral icterus.  Neck: Normal range of motion. Neck supple. No JVD present.  Cardiovascular: Normal rate, regular rhythm and normal heart sounds.  Exam reveals no gallop and no friction rub.   No murmur heard. Pulmonary/Chest: Effort normal and breath sounds normal. No respiratory distress. She has no wheezes. She has no rales. She exhibits no tenderness.  Abdominal: She exhibits no distension and no mass. There is no tenderness. There is no rebound and no guarding.  Musculoskeletal: She exhibits no edema and no tenderness.  Lymphadenopathy:    She has no cervical adenopathy.  Neurological: She is alert and oriented to person, place, and time. She has normal reflexes. She exhibits normal muscle tone. Coordination normal.  Skin: Skin is warm and dry. She is not diaphoretic. No erythema. No pallor.  Psychiatric: She has a normal mood and affect. Her behavior is normal. Judgment and thought content normal.          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

## 2012-03-29 ENCOUNTER — Other Ambulatory Visit: Payer: Self-pay | Admitting: Infectious Disease

## 2012-03-29 ENCOUNTER — Other Ambulatory Visit: Payer: Medicare Other

## 2012-03-29 DIAGNOSIS — B20 Human immunodeficiency virus [HIV] disease: Secondary | ICD-10-CM

## 2012-03-29 DIAGNOSIS — N951 Menopausal and female climacteric states: Secondary | ICD-10-CM

## 2012-03-29 DIAGNOSIS — Z2089 Contact with and (suspected) exposure to other communicable diseases: Secondary | ICD-10-CM

## 2012-03-29 DIAGNOSIS — R51 Headache: Secondary | ICD-10-CM

## 2012-03-29 DIAGNOSIS — R61 Generalized hyperhidrosis: Secondary | ICD-10-CM

## 2012-03-29 DIAGNOSIS — Z23 Encounter for immunization: Secondary | ICD-10-CM

## 2012-03-29 DIAGNOSIS — Z113 Encounter for screening for infections with a predominantly sexual mode of transmission: Secondary | ICD-10-CM

## 2012-03-29 DIAGNOSIS — R21 Rash and other nonspecific skin eruption: Secondary | ICD-10-CM

## 2012-03-29 DIAGNOSIS — Z207 Contact with and (suspected) exposure to pediculosis, acariasis and other infestations: Secondary | ICD-10-CM

## 2012-03-30 ENCOUNTER — Other Ambulatory Visit: Payer: Medicare Other

## 2012-03-30 LAB — CBC WITH DIFFERENTIAL/PLATELET
Basophils Absolute: 0 10*3/uL (ref 0.0–0.1)
HCT: 37 % (ref 36.0–46.0)
Lymphocytes Relative: 45 % (ref 12–46)
Lymphs Abs: 2.8 10*3/uL (ref 0.7–4.0)
Monocytes Absolute: 0.5 10*3/uL (ref 0.1–1.0)
Neutro Abs: 2.4 10*3/uL (ref 1.7–7.7)
Platelets: 401 10*3/uL — ABNORMAL HIGH (ref 150–400)
RBC: 4.32 MIL/uL (ref 3.87–5.11)
RDW: 14.7 % (ref 11.5–15.5)
WBC: 6.3 10*3/uL (ref 4.0–10.5)

## 2012-03-30 LAB — COMPREHENSIVE METABOLIC PANEL
ALT: 34 U/L (ref 0–35)
Alkaline Phosphatase: 121 U/L — ABNORMAL HIGH (ref 39–117)
Potassium: 3.9 mEq/L (ref 3.5–5.3)
Sodium: 141 mEq/L (ref 135–145)
Total Bilirubin: 0.3 mg/dL (ref 0.3–1.2)
Total Protein: 7.1 g/dL (ref 6.0–8.3)

## 2012-03-31 LAB — HIV-1 RNA QUANT-NO REFLEX-BLD: HIV-1 RNA Quant, Log: 1.79 {Log} — ABNORMAL HIGH (ref <1.30)

## 2012-04-19 ENCOUNTER — Encounter: Payer: Self-pay | Admitting: Infectious Disease

## 2012-04-19 ENCOUNTER — Ambulatory Visit (INDEPENDENT_AMBULATORY_CARE_PROVIDER_SITE_OTHER): Payer: Medicare Other | Admitting: Infectious Disease

## 2012-04-19 VITALS — BP 152/89 | HR 84 | Temp 97.7°F | Ht 66.0 in | Wt 204.0 lb

## 2012-04-19 DIAGNOSIS — B009 Herpesviral infection, unspecified: Secondary | ICD-10-CM

## 2012-04-19 DIAGNOSIS — N959 Unspecified menopausal and perimenopausal disorder: Secondary | ICD-10-CM

## 2012-04-19 DIAGNOSIS — B029 Zoster without complications: Secondary | ICD-10-CM

## 2012-04-19 DIAGNOSIS — B20 Human immunodeficiency virus [HIV] disease: Secondary | ICD-10-CM

## 2012-04-19 DIAGNOSIS — N951 Menopausal and female climacteric states: Secondary | ICD-10-CM | POA: Insufficient documentation

## 2012-04-19 DIAGNOSIS — G971 Other reaction to spinal and lumbar puncture: Secondary | ICD-10-CM

## 2012-04-19 DIAGNOSIS — G4700 Insomnia, unspecified: Secondary | ICD-10-CM

## 2012-04-19 DIAGNOSIS — E039 Hypothyroidism, unspecified: Secondary | ICD-10-CM

## 2012-04-19 DIAGNOSIS — R232 Flushing: Secondary | ICD-10-CM

## 2012-04-19 MED ORDER — PROMETHAZINE HCL 25 MG PO TABS: 12.5000 mg | ORAL_TABLET | Freq: Three times a day (TID) | ORAL | Status: DC | PRN

## 2012-04-19 MED ORDER — VALACYCLOVIR HCL 1 G PO TABS: 1000.0000 mg | ORAL_TABLET | Freq: Every day | ORAL | Status: DC

## 2012-04-19 MED ORDER — HYDROCODONE-ACETAMINOPHEN 5-500 MG PO TABS: 1.0000 | ORAL_TABLET | Freq: Three times a day (TID) | ORAL | Status: DC | PRN

## 2012-04-19 MED ORDER — ZOLPIDEM TARTRATE 5 MG PO TABS: 5.0000 mg | ORAL_TABLET | Freq: Every evening | ORAL | Status: DC | PRN

## 2012-04-19 MED ORDER — ESTROGENS CONJUGATED 0.3 MG PO TABS: 0.3000 mg | ORAL_TABLET | Freq: Every day | ORAL | Status: DC

## 2012-04-19 NOTE — Assessment & Plan Note (Signed)
Check thyroid symmetric, FSH 9 hormone start low-dose Premarin.

## 2012-04-19 NOTE — Assessment & Plan Note (Signed)
Perfect control 

## 2012-04-19 NOTE — Assessment & Plan Note (Signed)
Continue suppressive Valtrex 

## 2012-04-19 NOTE — Progress Notes (Signed)
  Subjective:    Patient ID: [name], female    DOB: December 08, 1965, 46 y.o.   MRN: [id number]  HPI  46 year old [place] lady with HIV/AIDS intermittently compliant with ARV admitted to B service in February with disseminated zoster and meningitis who has now once again successfully suppressed her virus down to 60s and CD4 up to 60 as well. She is doing well with nice control of her nausea with pherergan. She wishes for rx of what she believes are postmenopausal ssx. I spent greater than 45 minutes with the patient including greater than 50% of time in face to face counsel of the patient and in coordination of their care.    Review of Systems  Constitutional: Positive for diaphoresis and fatigue. Negative for fever, chills, activity change, appetite change and unexpected weight change.  HENT: Negative for congestion, sore throat, rhinorrhea, sneezing, trouble swallowing and sinus pressure.   Eyes: Negative for photophobia and visual disturbance.  Respiratory: Negative for cough, chest tightness, shortness of breath, wheezing and stridor.   Cardiovascular: Negative for chest pain, palpitations and leg swelling.  Gastrointestinal: Negative for nausea, vomiting, abdominal pain, diarrhea, constipation, blood in stool, abdominal distention and anal bleeding.  Genitourinary: Negative for dysuria, hematuria, flank pain and difficulty urinating.  Musculoskeletal: Negative for myalgias, back pain, joint swelling, arthralgias and gait problem.  Skin: Negative for color change, pallor, rash and wound.  Neurological: Negative for dizziness, tremors, weakness and light-headedness.  Hematological: Negative for adenopathy. Does not bruise/bleed easily.  Psychiatric/Behavioral: Negative for behavioral problems, confusion, disturbed wake/sleep cycle, dysphoric mood, decreased concentration and agitation.       Objective:   Physical Exam  Constitutional: She is oriented to person, place, and  time. She appears well-developed and well-nourished. No distress.  HENT:  Head: Normocephalic and atraumatic.  Mouth/Throat: Oropharynx is clear and moist. No oropharyngeal exudate.  Eyes: Conjunctivae and EOM are normal. Pupils are equal, round, and reactive to light. No scleral icterus.  Neck: Normal range of motion. Neck supple. No JVD present.  Cardiovascular: Normal rate, regular rhythm and normal heart sounds.  Exam reveals no gallop and no friction rub.   No murmur heard. Pulmonary/Chest: Effort normal and breath sounds normal. No respiratory distress. She has no wheezes. She has no rales. She exhibits no tenderness.  Abdominal: She exhibits no distension and no mass. There is no tenderness. There is no rebound and no guarding.  Musculoskeletal: She exhibits no edema and no tenderness.  Lymphadenopathy:    She has no cervical adenopathy.  Neurological: She is alert and oriented to person, place, and time. She has normal reflexes. She exhibits normal muscle tone. Coordination normal.  Skin: Skin is warm and dry. Rash noted. She is not diaphoretic. No erythema. No pallor.  Psychiatric: She has a normal mood and affect. Her behavior is normal. Judgment and thought content normal.          Assessment & Plan:  HIV DISEASE Perfect control  Shingles Continue suppressive Valtrex  MENOPAUSAL SYNDROME Check thyroid symmetric, FSH 9 hormone start low-dose Premarin.

## 2012-04-20 LAB — TSH: TSH: 0.866 u[IU]/mL (ref 0.350–4.500)

## 2012-05-01 LAB — ESTRADIOL, FREE

## 2012-05-27 ENCOUNTER — Other Ambulatory Visit: Payer: Self-pay | Admitting: Infectious Disease

## 2012-06-06 ENCOUNTER — Other Ambulatory Visit: Payer: Self-pay | Admitting: Licensed Clinical Social Worker

## 2012-06-06 DIAGNOSIS — G971 Other reaction to spinal and lumbar puncture: Secondary | ICD-10-CM

## 2012-06-06 MED ORDER — HYDROCODONE-ACETAMINOPHEN 5-500 MG PO TABS: 1.0000 | ORAL_TABLET | Freq: Three times a day (TID) | ORAL | Status: DC | PRN

## 2012-06-20 ENCOUNTER — Telehealth: Payer: Self-pay | Admitting: *Deleted

## 2012-06-20 NOTE — Telephone Encounter (Signed)
Patient called c/o flu like symptoms, cough, fever and nausea. Stated she does not have transportation to come for an appt.  Advised to take tylenol or ibuprofen for fever and body aches, and she can take Mucinex for congestion.  If she worsens will try to get a ride and make an appt to be seen. Wendall Mola CMA

## 2012-06-26 ENCOUNTER — Other Ambulatory Visit: Payer: Medicare Other

## 2012-06-26 ENCOUNTER — Other Ambulatory Visit (HOSPITAL_COMMUNITY)
Admission: RE | Admit: 2012-06-26 | Discharge: 2012-06-26 | Disposition: A | Discharge status: Home / Self Care | Payer: Medicare Other | Source: Ambulatory Visit | Attending: Infectious Disease | Admitting: Infectious Disease

## 2012-06-26 DIAGNOSIS — Z113 Encounter for screening for infections with a predominantly sexual mode of transmission: Secondary | ICD-10-CM | POA: Insufficient documentation

## 2012-06-26 DIAGNOSIS — B20 Human immunodeficiency virus [HIV] disease: Secondary | ICD-10-CM

## 2012-06-27 LAB — COMPLETE METABOLIC PANEL WITH GFR
Albumin: 4.5 g/dL (ref 3.5–5.2)
Alkaline Phosphatase: 116 U/L (ref 39–117)
BUN: 12 mg/dL (ref 6–23)
Calcium: 9.6 mg/dL (ref 8.4–10.5)
Chloride: 105 mEq/L (ref 96–112)
GFR, Est Non African American: 79 mL/min
Glucose, Bld: 74 mg/dL (ref 70–99)
Potassium: 3.8 mEq/L (ref 3.5–5.3)
Sodium: 141 mEq/L (ref 135–145)
Total Protein: 7.2 g/dL (ref 6.0–8.3)

## 2012-06-27 LAB — CBC WITH DIFFERENTIAL/PLATELET
Basophils Absolute: 0.1 10*3/uL (ref 0.0–0.1)
Basophils Relative: 1 % (ref 0–1)
Eosinophils Relative: 5 % (ref 0–5)
HCT: 35.9 % — ABNORMAL LOW (ref 36.0–46.0)
MCHC: 32.9 g/dL (ref 30.0–36.0)
MCV: 85.5 fL (ref 78.0–100.0)
Monocytes Absolute: 0.5 10*3/uL (ref 0.1–1.0)
Platelets: 417 10*3/uL — ABNORMAL HIGH (ref 150–400)
RDW: 14 % (ref 11.5–15.5)
WBC: 8.7 10*3/uL (ref 4.0–10.5)

## 2012-06-29 ENCOUNTER — Other Ambulatory Visit (HOSPITAL_COMMUNITY): Payer: Self-pay | Admitting: Internal Medicine

## 2012-06-30 NOTE — Telephone Encounter (Signed)
Please route back to the pharmacy - not [facility] pt.

## 2012-07-10 ENCOUNTER — Ambulatory Visit (INDEPENDENT_AMBULATORY_CARE_PROVIDER_SITE_OTHER): Payer: Medicare Other | Admitting: Infectious Disease

## 2012-07-10 VITALS — BP 164/95 | HR 91 | Temp 97.7°F | Ht 64.0 in | Wt 209.0 lb

## 2012-07-10 DIAGNOSIS — Z21 Asymptomatic human immunodeficiency virus [HIV] infection status: Secondary | ICD-10-CM

## 2012-07-10 DIAGNOSIS — Z113 Encounter for screening for infections with a predominantly sexual mode of transmission: Secondary | ICD-10-CM

## 2012-07-10 DIAGNOSIS — N951 Menopausal and female climacteric states: Secondary | ICD-10-CM

## 2012-07-10 DIAGNOSIS — B20 Human immunodeficiency virus [HIV] disease: Secondary | ICD-10-CM

## 2012-07-10 DIAGNOSIS — E785 Hyperlipidemia, unspecified: Secondary | ICD-10-CM

## 2012-07-10 DIAGNOSIS — N959 Unspecified menopausal and perimenopausal disorder: Secondary | ICD-10-CM

## 2012-07-10 DIAGNOSIS — F0630 Mood disorder due to known physiological condition, unspecified: Secondary | ICD-10-CM

## 2012-07-10 MED ORDER — SULFAMETHOXAZOLE-TRIMETHOPRIM 400-80 MG PO TABS: 1.0000 | ORAL_TABLET | Freq: Every day | ORAL | Status: DC

## 2012-07-10 NOTE — Assessment & Plan Note (Signed)
Perfect control continue current regimen and Bactrim for PCP prophylaxis

## 2012-07-10 NOTE — Assessment & Plan Note (Signed)
Currently does not wish to be on estrogen therapy more.

## 2012-07-10 NOTE — Assessment & Plan Note (Signed)
She is doing very well right now
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
## 2012-07-24 ENCOUNTER — Telehealth: Payer: Self-pay

## 2012-07-24 ENCOUNTER — Telehealth: Payer: Self-pay | Admitting: *Deleted

## 2012-07-24 DIAGNOSIS — G971 Other reaction to spinal and lumbar puncture: Secondary | ICD-10-CM

## 2012-07-24 MED ORDER — HYDROCODONE-ACETAMINOPHEN 5-500 MG PO TABS: 1.0000 | ORAL_TABLET | Freq: Three times a day (TID) | ORAL | Status: DC | PRN

## 2012-07-24 NOTE — Telephone Encounter (Signed)
Pt has been receiving Vicodin monthly since 12/2011.  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  Last 4 office visits the pt did not complain of pain.  Dr. Daiva Eves, please advise about continuing refills.

## 2012-07-24 NOTE — Telephone Encounter (Signed)
Pt calling for hydrocodone refill.  Medication was called to requested pharmacy.   Tray Klayman K Aiana Nordquist,RN

## 2012-07-24 NOTE — Telephone Encounter (Signed)
Fine to refill it. [name] is not an abuser of narcotics to my knowledge

## 2012-08-31 ENCOUNTER — Telehealth: Payer: Self-pay | Admitting: *Deleted

## 2012-08-31 NOTE — Telephone Encounter (Signed)
Pt reports continued headaches.  Requesting refill of "pain" rx, Vicodin.  Dr. Daiva Eves please advise.

## 2012-09-01 NOTE — Telephone Encounter (Signed)
That is fine to refill her vicodin

## 2012-09-04 ENCOUNTER — Other Ambulatory Visit: Payer: Self-pay | Admitting: *Deleted

## 2012-09-04 DIAGNOSIS — G971 Other reaction to spinal and lumbar puncture: Secondary | ICD-10-CM

## 2012-09-04 MED ORDER — HYDROCODONE-ACETAMINOPHEN 5-500 MG PO TABS: 1.0000 | ORAL_TABLET | Freq: Three times a day (TID) | ORAL | Status: DC | PRN

## 2012-09-04 NOTE — Telephone Encounter (Signed)
Per Dr. Clinton Gallant note will refill Vicodin.

## 2012-09-14 ENCOUNTER — Other Ambulatory Visit: Payer: Self-pay | Admitting: Infectious Disease

## 2012-09-14 DIAGNOSIS — Z1231 Encounter for screening mammogram for malignant neoplasm of breast: Secondary | ICD-10-CM

## 2012-09-26 ENCOUNTER — Other Ambulatory Visit: Payer: Medicare Other

## 2012-10-04 ENCOUNTER — Ambulatory Visit (HOSPITAL_COMMUNITY): Payer: Medicare Other

## 2012-10-11 ENCOUNTER — Ambulatory Visit (INDEPENDENT_AMBULATORY_CARE_PROVIDER_SITE_OTHER): Payer: Medicare Other | Admitting: Infectious Disease

## 2012-10-11 ENCOUNTER — Encounter: Payer: Self-pay | Admitting: Infectious Disease

## 2012-10-11 VITALS — BP 130/79 | HR 109 | Temp 97.6°F | Ht 66.0 in | Wt 213.0 lb

## 2012-10-11 DIAGNOSIS — Z23 Encounter for immunization: Secondary | ICD-10-CM

## 2012-10-11 DIAGNOSIS — N959 Unspecified menopausal and perimenopausal disorder: Secondary | ICD-10-CM

## 2012-10-11 DIAGNOSIS — B20 Human immunodeficiency virus [HIV] disease: Secondary | ICD-10-CM

## 2012-10-11 DIAGNOSIS — N951 Menopausal and female climacteric states: Secondary | ICD-10-CM

## 2012-10-11 LAB — COMPLETE METABOLIC PANEL WITH GFR
ALT: 45 U/L — ABNORMAL HIGH (ref 0–35)
AST: 18 U/L (ref 0–37)
Alkaline Phosphatase: 147 U/L — ABNORMAL HIGH (ref 39–117)
BUN: 12 mg/dL (ref 6–23)
Calcium: 9.9 mg/dL (ref 8.4–10.5)
Creat: 1.09 mg/dL (ref 0.50–1.10)
Total Bilirubin: 0.3 mg/dL (ref 0.3–1.2)

## 2012-10-11 LAB — CBC WITH DIFFERENTIAL/PLATELET
Basophils Absolute: 0 10*3/uL (ref 0.0–0.1)
Basophils Relative: 0 % (ref 0–1)
HCT: 38.3 % (ref 36.0–46.0)
Hemoglobin: 12.5 g/dL (ref 12.0–15.0)
Lymphocytes Relative: 45 % (ref 12–46)
MCHC: 32.6 g/dL (ref 30.0–36.0)
Monocytes Relative: 6 % (ref 3–12)
Neutro Abs: 4 10*3/uL (ref 1.7–7.7)
Neutrophils Relative %: 44 % (ref 43–77)
RBC: 4.51 MIL/uL (ref 3.87–5.11)
WBC: 8.9 10*3/uL (ref 4.0–10.5)

## 2012-10-11 MED ORDER — ESTRADIOL 1 MG PO TABS: 1.0000 mg | ORAL_TABLET | Freq: Every day | ORAL | Status: DC

## 2012-10-11 NOTE — Progress Notes (Signed)
  Subjective:    Patient ID: [name], female    DOB: 21-Apr-1966, 46 y.o.   MRN: [id number]  HPI  46 year old African American lady with HIV and AIDS who this fall had once again been able to perfectly suppress her viral load to undetectable levels on prezista, norvir and truvada and reconstitute her CD4 count to 190. She comes in today stating that she missed 3 days of meds but no more. She is taking premarin again for postmenopausal ssx and we discussed varioud options and I ultimately opted to give her a trial of estradiol.    Review of Systems  Constitutional: Negative for fever, chills, diaphoresis, activity change, appetite change, fatigue and unexpected weight change.  HENT: Negative for congestion, sore throat, rhinorrhea, sneezing, trouble swallowing and sinus pressure.   Eyes: Negative for photophobia and visual disturbance.  Respiratory: Negative for cough, chest tightness, shortness of breath, wheezing and stridor.   Cardiovascular: Negative for chest pain, palpitations and leg swelling.  Gastrointestinal: Negative for nausea, vomiting, abdominal pain, diarrhea, constipation, blood in stool, abdominal distention and anal bleeding.  Genitourinary: Negative for dysuria, hematuria, flank pain and difficulty urinating.  Musculoskeletal: Negative for myalgias, back pain, joint swelling, arthralgias and gait problem.  Skin: Negative for color change, pallor, rash and wound.  Neurological: Negative for dizziness, tremors, weakness and light-headedness.  Hematological: Negative for adenopathy. Does not bruise/bleed easily.  Psychiatric/Behavioral: Negative for behavioral problems, confusion, sleep disturbance, dysphoric mood, decreased concentration and agitation.       Objective:   Physical Exam  Constitutional: She is oriented to person, place, and time. She appears well-developed and well-nourished. No distress.  HENT:  Head: Normocephalic and atraumatic.  Mouth/Throat:  Oropharynx is clear and moist. No oropharyngeal exudate.  Eyes: Conjunctivae normal and EOM are normal. Pupils are equal, round, and reactive to light. No scleral icterus.  Neck: Normal range of motion. Neck supple. No JVD present.  Cardiovascular: Normal rate, regular rhythm and normal heart sounds.  Exam reveals no gallop and no friction rub.   No murmur heard. Pulmonary/Chest: Effort normal and breath sounds normal. No respiratory distress. She has no wheezes. She has no rales. She exhibits no tenderness.  Abdominal: She exhibits no distension and no mass. There is no tenderness. There is no rebound and no guarding.  Musculoskeletal: She exhibits no edema and no tenderness.  Lymphadenopathy:    She has no cervical adenopathy.  Neurological: She is alert and oriented to person, place, and time. She has normal reflexes. She exhibits normal muscle tone. Coordination normal.  Skin: Skin is warm and dry. She is not diaphoretic. No erythema. No pallor.  Psychiatric: She has a normal mood and affect. Her behavior is normal. Judgment and thought content normal.          Assessment & Plan:  HIV: continue prezista, norvir and truvada, recheck VL and CD4 count today and then bring back in 3 months if labs are encouraging  Postmenopausal ssx; start her on estradiol i place of premarin

## 2012-10-12 LAB — HIV-1 RNA QUANT-NO REFLEX-BLD
HIV 1 RNA Quant: 68 copies/mL — ABNORMAL HIGH (ref <20)
HIV-1 RNA Quant, Log: 1.83 {Log} — ABNORMAL HIGH (ref <1.30)

## 2012-10-23 ENCOUNTER — Other Ambulatory Visit: Payer: Self-pay | Admitting: Licensed Clinical Social Worker

## 2012-10-23 DIAGNOSIS — G971 Other reaction to spinal and lumbar puncture: Secondary | ICD-10-CM

## 2012-10-23 MED ORDER — HYDROCODONE-ACETAMINOPHEN 5-500 MG PO TABS: 1.0000 | ORAL_TABLET | Freq: Three times a day (TID) | ORAL | Status: DC | PRN

## 2012-11-03 ENCOUNTER — Ambulatory Visit (HOSPITAL_COMMUNITY): Payer: Medicare Other

## 2012-11-04 ENCOUNTER — Other Ambulatory Visit (HOSPITAL_COMMUNITY): Payer: Self-pay | Admitting: Internal Medicine

## 2012-11-07 ENCOUNTER — Other Ambulatory Visit: Payer: Self-pay | Admitting: Licensed Clinical Social Worker

## 2012-11-07 DIAGNOSIS — B20 Human immunodeficiency virus [HIV] disease: Secondary | ICD-10-CM

## 2012-11-07 DIAGNOSIS — B009 Herpesviral infection, unspecified: Secondary | ICD-10-CM

## 2012-11-07 MED ORDER — RITONAVIR 100 MG PO TABS: 100.0000 mg | ORAL_TABLET | Freq: Every day | ORAL | Status: DC

## 2012-11-07 MED ORDER — VALACYCLOVIR HCL 1 G PO TABS: 1000.0000 mg | ORAL_TABLET | Freq: Every day | ORAL | Status: DC

## 2012-11-07 MED ORDER — EMTRICITABINE-TENOFOVIR DF 200-300 MG PO TABS: 1.0000 | ORAL_TABLET | Freq: Every day | ORAL | Status: DC

## 2012-11-07 MED ORDER — DARUNAVIR ETHANOLATE 800 MG PO TABS: 800.0000 mg | ORAL_TABLET | Freq: Every day | ORAL | Status: DC

## 2012-11-10 ENCOUNTER — Ambulatory Visit (HOSPITAL_COMMUNITY): Payer: Medicare Other | Attending: Infectious Disease

## 2012-11-17 ENCOUNTER — Other Ambulatory Visit: Payer: Self-pay | Admitting: *Deleted

## 2012-11-17 DIAGNOSIS — K219 Gastro-esophageal reflux disease without esophagitis: Secondary | ICD-10-CM

## 2012-11-17 MED ORDER — OMEPRAZOLE 20 MG PO CPDR: 20.0000 mg | DELAYED_RELEASE_CAPSULE | Freq: Two times a day (BID) | ORAL | Status: DC

## 2012-11-23 ENCOUNTER — Telehealth: Payer: Self-pay | Admitting: *Deleted

## 2012-11-23 NOTE — Telephone Encounter (Signed)
Called patient to remind her she is due for an annual PAP. Patient unable to make an appointment at this time, as her grandmother is currently hospitalized.  Patient will call us to schedule this appointment when "things settle down" at home. Andree Coss, RN

## 2012-12-11 ENCOUNTER — Other Ambulatory Visit: Payer: Self-pay | Admitting: *Deleted

## 2012-12-11 DIAGNOSIS — R51 Headache: Secondary | ICD-10-CM

## 2012-12-11 DIAGNOSIS — G971 Other reaction to spinal and lumbar puncture: Secondary | ICD-10-CM

## 2012-12-11 MED ORDER — HYDROCODONE-ACETAMINOPHEN 5-325 MG PO TABS: 1.0000 | ORAL_TABLET | Freq: Four times a day (QID) | ORAL | Status: DC | PRN

## 2012-12-11 NOTE — Telephone Encounter (Signed)
Called in Rx to pharmacy.

## 2013-01-08 ENCOUNTER — Other Ambulatory Visit: Payer: Medicare Other

## 2013-01-22 ENCOUNTER — Ambulatory Visit (INDEPENDENT_AMBULATORY_CARE_PROVIDER_SITE_OTHER): Payer: Medicare Other | Admitting: Infectious Disease

## 2013-01-22 ENCOUNTER — Encounter: Payer: Self-pay | Admitting: Infectious Disease

## 2013-01-22 VITALS — BP 149/94 | HR 83 | Temp 97.6°F | Ht 63.0 in | Wt 216.0 lb

## 2013-01-22 DIAGNOSIS — R51 Headache: Secondary | ICD-10-CM

## 2013-01-22 DIAGNOSIS — F4321 Adjustment disorder with depressed mood: Secondary | ICD-10-CM

## 2013-01-22 DIAGNOSIS — R252 Cramp and spasm: Secondary | ICD-10-CM

## 2013-01-22 DIAGNOSIS — R110 Nausea: Secondary | ICD-10-CM

## 2013-01-22 DIAGNOSIS — Z780 Asymptomatic menopausal state: Secondary | ICD-10-CM

## 2013-01-22 DIAGNOSIS — F329 Major depressive disorder, single episode, unspecified: Secondary | ICD-10-CM

## 2013-01-22 DIAGNOSIS — B20 Human immunodeficiency virus [HIV] disease: Secondary | ICD-10-CM

## 2013-01-22 LAB — CBC WITH DIFFERENTIAL/PLATELET
Basophils Absolute: 0.1 10*3/uL (ref 0.0–0.1)
HCT: 38 % (ref 36.0–46.0)
Hemoglobin: 12.6 g/dL (ref 12.0–15.0)
Lymphocytes Relative: 49 % — ABNORMAL HIGH (ref 12–46)
Monocytes Absolute: 0.4 10*3/uL (ref 0.1–1.0)
Neutro Abs: 3 10*3/uL (ref 1.7–7.7)
RDW: 14.9 % (ref 11.5–15.5)
WBC: 7.7 10*3/uL (ref 4.0–10.5)

## 2013-01-22 LAB — COMPLETE METABOLIC PANEL WITH GFR
ALT: 43 U/L — ABNORMAL HIGH (ref 0–35)
AST: 24 U/L (ref 0–37)
Albumin: 4.5 g/dL (ref 3.5–5.2)
BUN: 16 mg/dL (ref 6–23)
Calcium: 9.9 mg/dL (ref 8.4–10.5)
Chloride: 104 mEq/L (ref 96–112)
Potassium: 4.7 mEq/L (ref 3.5–5.3)
Sodium: 141 mEq/L (ref 135–145)
Total Protein: 7.2 g/dL (ref 6.0–8.3)

## 2013-01-22 MED ORDER — CYCLOBENZAPRINE HCL 5 MG PO TABS: 5.0000 mg | ORAL_TABLET | Freq: Three times a day (TID) | ORAL | Status: DC | PRN

## 2013-01-22 MED ORDER — HYDROCODONE-ACETAMINOPHEN 5-325 MG PO TABS: 1.0000 | ORAL_TABLET | Freq: Four times a day (QID) | ORAL | Status: DC | PRN

## 2013-01-22 NOTE — Patient Instructions (Addendum)
Remind [name] to remind Dr. Daiva Eves to bring a souvenir from [place] for you for your next visit
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
## 2013-01-23 ENCOUNTER — Telehealth: Payer: Self-pay | Admitting: *Deleted

## 2013-01-23 LAB — HIV-1 RNA QUANT-NO REFLEX-BLD
HIV 1 RNA Quant: 54 copies/mL — ABNORMAL HIGH (ref <20)
HIV-1 RNA Quant, Log: 1.73 {Log} — ABNORMAL HIGH (ref <1.30)

## 2013-01-23 NOTE — Telephone Encounter (Signed)
PA initiated for Flexeril 5mg  TID PRN.  Reference# [id number].  Pharmacy will contact us if they require more information.  Pharmacy will contact us and the patient with either an approval or denial of coverage.  Process can take up to 2 weeks, but usually takes 5 business days. Andree Coss, RN

## 2013-01-24 ENCOUNTER — Telehealth: Payer: Self-pay | Admitting: *Deleted

## 2013-01-24 NOTE — Telephone Encounter (Signed)
Approved through 10/31/2013 through Medicare Part D.

## 2013-01-30 NOTE — Telephone Encounter (Signed)
No - I didn't mean to CC you.  Sorry!

## 2013-01-30 NOTE — Telephone Encounter (Signed)
No worries thx!

## 2013-01-30 NOTE — Telephone Encounter (Signed)
Kelly Duster do I need to do anything more at this point?

## 2013-02-06 ENCOUNTER — Encounter: Payer: Self-pay | Admitting: Obstetrics & Gynecology

## 2013-02-06 ENCOUNTER — Other Ambulatory Visit: Payer: Self-pay | Admitting: Infectious Disease

## 2013-02-06 DIAGNOSIS — R110 Nausea: Secondary | ICD-10-CM

## 2013-02-09 ENCOUNTER — Telehealth: Payer: Self-pay | Admitting: *Deleted

## 2013-02-09 NOTE — Telephone Encounter (Signed)
Patient called requesting that Dr. Daiva Eves call her in an antibiotic for nasal and chest congestion. Explained she would need to be seen and there are no appts available today. Patient said she couldn't come in anyway and just wanted to speak with [name], CMA. Phone note routed  [employer]

## 2013-02-21 ENCOUNTER — Other Ambulatory Visit: Payer: 59

## 2013-03-05 ENCOUNTER — Encounter: Payer: PRIVATE HEALTH INSURANCE | Admitting: Obstetrics & Gynecology

## 2013-03-07 ENCOUNTER — Ambulatory Visit: Payer: 59 | Admitting: Infectious Disease

## 2013-03-19 ENCOUNTER — Ambulatory Visit (INDEPENDENT_AMBULATORY_CARE_PROVIDER_SITE_OTHER): Payer: PRIVATE HEALTH INSURANCE | Admitting: Infectious Disease

## 2013-03-19 ENCOUNTER — Encounter: Payer: Self-pay | Admitting: Infectious Disease

## 2013-03-19 VITALS — BP 152/89 | HR 69 | Temp 97.9°F | Ht 65.0 in | Wt 215.0 lb

## 2013-03-19 DIAGNOSIS — G4700 Insomnia, unspecified: Secondary | ICD-10-CM

## 2013-03-19 DIAGNOSIS — E785 Hyperlipidemia, unspecified: Secondary | ICD-10-CM

## 2013-03-19 DIAGNOSIS — R51 Headache: Secondary | ICD-10-CM

## 2013-03-19 DIAGNOSIS — B009 Herpesviral infection, unspecified: Secondary | ICD-10-CM

## 2013-03-19 DIAGNOSIS — K219 Gastro-esophageal reflux disease without esophagitis: Secondary | ICD-10-CM

## 2013-03-19 DIAGNOSIS — B20 Human immunodeficiency virus [HIV] disease: Secondary | ICD-10-CM

## 2013-03-19 MED ORDER — DOLUTEGRAVIR SODIUM 50 MG PO TABS: 50.0000 mg | ORAL_TABLET | Freq: Every day | ORAL | Status: DC

## 2013-03-19 MED ORDER — OMEPRAZOLE 20 MG PO CPDR: 20.0000 mg | DELAYED_RELEASE_CAPSULE | Freq: Two times a day (BID) | ORAL | Status: DC

## 2013-03-19 MED ORDER — PROMETHAZINE HCL 25 MG PO TABS: 12.5000 mg | ORAL_TABLET | Freq: Three times a day (TID) | ORAL | Status: DC | PRN

## 2013-03-19 MED ORDER — HYDROCODONE-ACETAMINOPHEN 5-325 MG PO TABS: 1.0000 | ORAL_TABLET | Freq: Three times a day (TID) | ORAL | Status: DC | PRN

## 2013-03-19 MED ORDER — VALACYCLOVIR HCL 1 G PO TABS: 1000.0000 mg | ORAL_TABLET | Freq: Every day | ORAL | Status: DC

## 2013-03-19 MED ORDER — ZOLPIDEM TARTRATE 5 MG PO TABS: 5.0000 mg | ORAL_TABLET | Freq: Every evening | ORAL | Status: DC | PRN

## 2013-03-19 MED ORDER — EMTRICITABINE-TENOFOVIR DF 200-300 MG PO TABS: 1.0000 | ORAL_TABLET | Freq: Every day | ORAL | Status: DC

## 2013-03-19 NOTE — Progress Notes (Signed)
HPI: [name] is a 47 y.o. female with HIV/AIDs diagnosed in 2006. She's has a history of poor adherence where she'll do well for a time then stop medications all together. In the last year she has done very well on treatment with a nearly undetectable VL and CD4 now 400. She does express some issues with obtaining her medications from the pharmacy where she says she calls a week ahead but they still won't have them in and she'll stop treatment for a few days at a time.  I told her the next time this happens they need to be calling other pharmacies for her and transferring the medications so she can get them filled on time.  Allergies: Allergies  Allergen Reactions  . Morphine Other (See Comments)    Burning under the skin  . Doxycycline Hyclate Rash    Vitals: Temp: 97.9 F (36.6 C) (05/19 1041) Temp src: Oral (05/19 1041) BP: 152/89 mmHg (05/19 1041) Pulse Rate: 69 (05/19 1041)  Past Medical History: Past Medical History  Diagnosis Date  . HIV infection   . Shingles     2000    Social History: History   Social History  . Marital Status: Single    Spouse Name: N/A    Number of Children: N/A  . Years of Education: N/A   Social History Main Topics  . Smoking status: Former Smoker    Types: Cigarettes  . Smokeless tobacco: Never Used  . Alcohol Use: No  . Drug Use: 14.00 per week    Special: Marijuana     Comment: hx of cocaine use (stopped 2009)  . Sexually Active: Not Currently     Comment: pt. declined condoms   Other Topics Concern  . None   Social History Narrative  . None    Previous Regimen: Kaletra, ritonavir, Truvada  Current Regimen: Darunavir, ritonavir, Truvada  Labs: HIV 1 RNA Quant (copies/mL)  Date Value  01/22/2013 54*  10/11/2012 68*  06/26/2012 <20      CD4 T Cell Abs (cmm)  Date Value  01/22/2013 400   10/11/2012 280*  06/26/2012 140*     Hep B S Ab (no units)  Date Value  04/22/2008 POS*     Hepatitis B Surface Ag (no  units)  Date Value  12/26/2006 NO      HCV Ab (no units)  Date Value  12/26/2006 NO     CrCl: Estimated Creatinine Clearance: 84.6 ml/min (by C-G formula based on Cr of 0.95).  Lipids:    Component Value Date/Time   CHOL 179 01/30/2007 0000   TRIG 62 01/30/2007 0000   HDL 36* 01/30/2007 0000   CHOLHDL 5.0 Ratio 01/30/2007 0000   VLDL 12 01/30/2007 0000   LDLCALC 131* 01/30/2007 0000    Assessment:  Patient is doing well on current therapy, but has chronic headaches and nausea requiring Vicodin and promethazine. We believe this problems may be stemming from the ritonavir and the decision to change DRV/r to dolutegravir was made. I counseled the patient on the medication, specifically the need to separate her MVI from when she takes the dolutegravir.   BP has been elevated last 4 out of 5 visits. We spoke about likely need to treat her BP in the near future. She was hesitant, but willing after discussing the need and ability ro ween off if she is able to make positive lifestyle changes. Since many other changes were made will f/u at next visit. Urine protein is being  checked today, if elevated would start lisinopril. If urine protein is normal would start with amplodipine since this is generally more effective in african americans and well tolerated.  She also has not had a lipid panel recently. Based on previous panel she would qualify for a moderate dose statin with 10 year ASCVD risk of 5.2%.  Since darunavir is being changed to dolutegravir this should improve lipids. Would consider checking lipids next visit.  Recommendations: - Change DRV/r to dolutegravir once daiyl - stop Bactrim - follow up on need for BP meds and lipid panel next visit  Drue Stager, PharmD [facility] 03/19/2013, 11:54 AM

## 2013-03-19 NOTE — Patient Instructions (Addendum)
YOUR NEW HIV REGIMEN IS:  TIVICAY (DOLUTEGRAVIR) 50MG  YELLOW CIRCULAR PILL  DAILY  AND  TRUVADA ONE BLUE PILL DAILY

## 2013-03-19 NOTE — Progress Notes (Signed)
Subjective:    Patient ID: [name], female    DOB: 1966/09/04, 47 y.o.   MRN: [id number]  HPI  47 year old African American lady with HIV and AIDS who this  once again been able to perfectly suppress her viral load to nearly undetectable levels on prezista, norvir and truvada and reconstitute her CD4 count now to >400 based on March labs. She has had problems with her local [facility] always having meds there on time even when she calls a week ahead of time. I have suggested to change pharmacy but [name] would like to continue with same pharamcy.   She continues to suffer from nausea and at times emesis related to meds and also from headaches.   I have offered to change her to [place] today and she is agreeable to the change.   She is still also suffering from muscle cramps from time to time and we will tried to rx  flexeril to see if this will help but she claims medicaid denied this. I will send new script and she can even consider getting this from [facility].   My pharmacist and I spent greater than 45 minutes with the patient including greater than 50% of time in face to face counsel of the patient and in coordination of their care.   Review of Systems  Constitutional: Negative for fever, chills, diaphoresis, activity change, appetite change, fatigue and unexpected weight change.  HENT: Negative for congestion, sore throat, rhinorrhea, sneezing, trouble swallowing and sinus pressure.   Eyes: Negative for photophobia and visual disturbance.  Respiratory: Negative for cough, chest tightness, shortness of breath, wheezing and stridor.   Cardiovascular: Negative for chest pain, palpitations and leg swelling.  Gastrointestinal: Negative for nausea, vomiting, abdominal pain, diarrhea, constipation, blood in stool, abdominal distention and anal bleeding.  Genitourinary: Negative for dysuria, hematuria, flank pain and difficulty urinating.  Musculoskeletal: Negative for  myalgias, back pain, joint swelling, arthralgias and gait problem.  Skin: Negative for color change, pallor, rash and wound.  Neurological: Negative for dizziness, tremors, weakness and light-headedness.  Hematological: Negative for adenopathy. Does not bruise/bleed easily.  Psychiatric/Behavioral: Negative for behavioral problems, confusion, sleep disturbance, dysphoric mood, decreased concentration and agitation.       Objective:   Physical Exam  Constitutional: She is oriented to person, place, and time. She appears well-developed and well-nourished. No distress.  HENT:  Head: Normocephalic and atraumatic.  Mouth/Throat: Oropharynx is clear and moist. No oropharyngeal exudate.  Eyes: Conjunctivae and EOM are normal. Pupils are equal, round, and reactive to light. No scleral icterus.  Neck: Normal range of motion. Neck supple. No JVD present.  Cardiovascular: Normal rate, regular rhythm and normal heart sounds.  Exam reveals no gallop and no friction rub.   No murmur heard. Pulmonary/Chest: Effort normal and breath sounds normal. No respiratory distress. She has no wheezes. She has no rales. She exhibits no tenderness.  Abdominal: She exhibits no distension and no mass. There is no tenderness. There is no rebound and no guarding.  Musculoskeletal: She exhibits no edema and no tenderness.  Lymphadenopathy:    She has no cervical adenopathy.  Neurological: She is alert and oriented to person, place, and time. She has normal reflexes. She exhibits normal muscle tone. Coordination normal.  Skin: Skin is warm and dry. She is not diaphoretic. No erythema. No pallor.  Psychiatric: She has a normal mood and affect. Her behavior is normal. Judgment and thought content normal.  Assessment & Plan:  [id number] to Tivicay and truvada, check HLA b701 in case we want to new STR  Cramping: try to get rx flexeril  Nausea: responds to her antemetics and marijuana, hopefully with  change  regimen to Tivicay and truvada this shold abate  Obesity: working on this with exercise.   HTN: check microablumin to creatinine ratio and may need anti HTNsive  Hyperlipidemia: recheck labs 3-4 months post change to tivicay  Headaches, chronic: continue vicodin for now  GERD: ppi to be continued but doubt taht she really needs bid therapy

## 2013-03-27 ENCOUNTER — Telehealth: Payer: Self-pay | Admitting: *Deleted

## 2013-03-27 NOTE — Telephone Encounter (Signed)
Pt confirmed that her new regimen is Tivicay and Truvada once daily. Andree Coss, RN

## 2013-04-16 ENCOUNTER — Other Ambulatory Visit: Payer: PRIVATE HEALTH INSURANCE

## 2013-04-16 DIAGNOSIS — B20 Human immunodeficiency virus [HIV] disease: Secondary | ICD-10-CM

## 2013-04-16 LAB — CBC WITH DIFFERENTIAL/PLATELET
Basophils Absolute: 0.1 10*3/uL (ref 0.0–0.1)
Basophils Relative: 1 % (ref 0–1)
Eosinophils Relative: 5 % (ref 0–5)
HCT: 38.8 % (ref 36.0–46.0)
Hemoglobin: 12.8 g/dL (ref 12.0–15.0)
MCH: 27.6 pg (ref 26.0–34.0)
MCHC: 33 g/dL (ref 30.0–36.0)
MCV: 83.6 fL (ref 78.0–100.0)
Monocytes Absolute: 0.5 10*3/uL (ref 0.1–1.0)
Monocytes Relative: 6 % (ref 3–12)
Neutro Abs: 3.3 10*3/uL (ref 1.7–7.7)
RDW: 14.6 % (ref 11.5–15.5)

## 2013-04-16 LAB — COMPLETE METABOLIC PANEL WITH GFR
Albumin: 4.2 g/dL (ref 3.5–5.2)
BUN: 11 mg/dL (ref 6–23)
CO2: 25 mEq/L (ref 19–32)
Chloride: 104 mEq/L (ref 96–112)
Creat: 0.98 mg/dL (ref 0.50–1.10)

## 2013-04-17 LAB — T-HELPER CELL (CD4) - (RCID CLINIC ONLY): CD4 % Helper T Cell: 12 % — ABNORMAL LOW (ref 33–55)

## 2013-04-30 ENCOUNTER — Encounter: Payer: Self-pay | Admitting: Infectious Disease

## 2013-04-30 ENCOUNTER — Ambulatory Visit (INDEPENDENT_AMBULATORY_CARE_PROVIDER_SITE_OTHER): Payer: PRIVATE HEALTH INSURANCE | Admitting: Infectious Disease

## 2013-04-30 VITALS — BP 138/85 | HR 81 | Temp 97.8°F | Wt 214.0 lb

## 2013-04-30 DIAGNOSIS — M25529 Pain in unspecified elbow: Secondary | ICD-10-CM

## 2013-04-30 DIAGNOSIS — B20 Human immunodeficiency virus [HIV] disease: Secondary | ICD-10-CM

## 2013-04-30 DIAGNOSIS — R51 Headache: Secondary | ICD-10-CM

## 2013-04-30 DIAGNOSIS — R21 Rash and other nonspecific skin eruption: Secondary | ICD-10-CM

## 2013-04-30 NOTE — Progress Notes (Signed)
Subjective:    Patient ID: [name], female    DOB: 04-16-1966, 47 y.o.   MRN: [id number]  HPI   48 year old African American lady with HIV and AIDS who this  once again been able to perfectly suppress her viral load to nearly undetectable levels on prezista, norvir and truvada and reconstitute her CD4 count now to >400  I changed her from P/N/T to [place].  Since having made this change she has noted pain in her elbows bilaterally and a rash on dorsum of her arms that has not worsened.  We discussed changing back to P/N/T but she wished to remain on new regimen as HA less.   I explained the importance of her remaining on a regimen that is with a high barrier to to R.     Review of Systems  Constitutional: Negative for fever, chills, diaphoresis, activity change, appetite change, fatigue and unexpected weight change.  HENT: Negative for congestion, sore throat, rhinorrhea, sneezing, trouble swallowing and sinus pressure.   Eyes: Negative for photophobia and visual disturbance.  Respiratory: Negative for cough, chest tightness, shortness of breath, wheezing and stridor.   Cardiovascular: Negative for chest pain, palpitations and leg swelling.  Gastrointestinal: Negative for nausea, vomiting, abdominal pain, diarrhea, constipation, blood in stool, abdominal distention and anal bleeding.  Genitourinary: Negative for dysuria, hematuria, flank pain and difficulty urinating.  Musculoskeletal: Positive for arthralgias. Negative for myalgias, back pain, joint swelling and gait problem.  Skin: Positive for rash. Negative for color change, pallor and wound.  Neurological: Negative for dizziness, tremors, weakness and light-headedness.  Hematological: Negative for adenopathy. Does not bruise/bleed easily.  Psychiatric/Behavioral: Negative for behavioral problems, confusion, sleep disturbance, dysphoric mood, decreased concentration and agitation.       Objective:   Physical Exam  Constitutional: She is oriented to person, place, and time. She appears well-developed and well-nourished. No distress.  HENT:  Head: Normocephalic and atraumatic.  Mouth/Throat: Oropharynx is clear and moist. No oropharyngeal exudate.  Eyes: Conjunctivae and EOM are normal. Pupils are equal, round, and reactive to light. No scleral icterus.  Neck: Normal range of motion. Neck supple. No JVD present.  Cardiovascular: Normal rate, regular rhythm and normal heart sounds.  Exam reveals no gallop and no friction rub.   No murmur heard. Pulmonary/Chest: Effort normal and breath sounds normal. No respiratory distress. She has no wheezes. She has no rales. She exhibits no tenderness.  Abdominal: She exhibits no distension and no mass. There is no tenderness. There is no rebound and no guarding.  Musculoskeletal: She exhibits no edema and no tenderness.       Arms: Lymphadenopathy:    She has no cervical adenopathy.  Neurological: She is alert and oriented to person, place, and time. She has normal reflexes. She exhibits normal muscle tone. Coordination normal.  Skin: Skin is warm and dry. She is not diaphoretic. No erythema. No pallor.  Psychiatric: She has a normal mood and affect. Her behavior is normal. Judgment and thought content normal.          Assessment & Plan:  HIV: Continue on  Tivicay and truvada,  Likely change to STR with Tivicay and Epzicom when available (HLA B701 NEGATIVE)  Elbow pain and rash: could this be Adverse drug rxn? Offered to switch her back but she wished to remain on this regimen and monitor ssx  Nausea: better with change in ARV  Obesity: working on this with exercise.   HTN: check microablumin to  creatinine ratio and may need anti HTNsive  Headaches: better OFF of P/N  GERD: ppi to be continued

## 2013-05-08 ENCOUNTER — Telehealth: Payer: Self-pay | Admitting: Licensed Clinical Social Worker

## 2013-05-08 NOTE — Telephone Encounter (Signed)
Patient called stating that she is having joint aches that started 2 to 3 weeks after starting her new regimen. She states she will continue taking it until she hear from Dr. Daiva Eves. Patient denies any other symptoms.

## 2013-05-14 ENCOUNTER — Encounter: Payer: Self-pay | Admitting: *Deleted

## 2013-05-14 ENCOUNTER — Telehealth: Payer: Self-pay | Admitting: *Deleted

## 2013-05-14 NOTE — Telephone Encounter (Signed)
Patient called and advised that since she started taking the new drug, Tivicay, her joints have been hurting and it is getting worse. She advised she was trying to continues taking the med but it is getting worse and she wants to stop and go back to her original meds. Advised her will let Dr Daiva Eves know and once he advises will give her a call back. She advised she will not take anymore either way so hurry up and call her back.

## 2013-05-15 NOTE — Telephone Encounter (Signed)
Dollene told me this at the last appt. I am fine with switch back to prezista norvir and truvada but she preferred not to do that at that appt. Is this now worse than when I saw her?

## 2013-05-15 NOTE — Telephone Encounter (Signed)
She can go back on Prezista 800mg  once daily, Norvir 100mg  once dailiy and truvada one tablet once daily

## 2013-05-15 NOTE — Telephone Encounter (Signed)
Yes it is because she called back yesterday.

## 2013-05-15 NOTE — Telephone Encounter (Signed)
Lets switch her back

## 2013-05-16 ENCOUNTER — Other Ambulatory Visit: Payer: Self-pay | Admitting: Licensed Clinical Social Worker

## 2013-05-16 DIAGNOSIS — R51 Headache: Secondary | ICD-10-CM

## 2013-05-16 DIAGNOSIS — B20 Human immunodeficiency virus [HIV] disease: Secondary | ICD-10-CM

## 2013-05-16 MED ORDER — DARUNAVIR ETHANOLATE 800 MG PO TABS: 800.0000 mg | ORAL_TABLET | Freq: Every day | ORAL | Status: DC

## 2013-05-16 MED ORDER — HYDROCODONE-ACETAMINOPHEN 5-325 MG PO TABS: 1.0000 | ORAL_TABLET | Freq: Three times a day (TID) | ORAL | Status: DC | PRN

## 2013-05-16 MED ORDER — RITONAVIR 100 MG PO TABS: 100.0000 mg | ORAL_TABLET | Freq: Every day | ORAL | Status: DC

## 2013-05-22 ENCOUNTER — Other Ambulatory Visit: Payer: Self-pay | Admitting: *Deleted

## 2013-05-22 DIAGNOSIS — K219 Gastro-esophageal reflux disease without esophagitis: Secondary | ICD-10-CM

## 2013-05-22 MED ORDER — OMEPRAZOLE 20 MG PO CPDR: 20.0000 mg | DELAYED_RELEASE_CAPSULE | Freq: Two times a day (BID) | ORAL | Status: DC

## 2013-08-01 ENCOUNTER — Other Ambulatory Visit: Payer: PRIVATE HEALTH INSURANCE

## 2013-08-06 ENCOUNTER — Other Ambulatory Visit: Payer: Self-pay | Admitting: Licensed Clinical Social Worker

## 2013-08-06 DIAGNOSIS — R51 Headache: Secondary | ICD-10-CM

## 2013-08-06 MED ORDER — HYDROCODONE-ACETAMINOPHEN 5-325 MG PO TABS: 1.0000 | ORAL_TABLET | Freq: Three times a day (TID) | ORAL | Status: DC | PRN

## 2013-08-15 ENCOUNTER — Ambulatory Visit (INDEPENDENT_AMBULATORY_CARE_PROVIDER_SITE_OTHER): Payer: PRIVATE HEALTH INSURANCE | Admitting: Infectious Disease

## 2013-08-15 ENCOUNTER — Encounter: Payer: Self-pay | Admitting: Infectious Disease

## 2013-08-15 VITALS — BP 138/94 | HR 73 | Temp 98.4°F | Wt 216.0 lb

## 2013-08-15 DIAGNOSIS — R21 Rash and other nonspecific skin eruption: Secondary | ICD-10-CM

## 2013-08-15 DIAGNOSIS — R51 Headache: Secondary | ICD-10-CM

## 2013-08-15 DIAGNOSIS — M25529 Pain in unspecified elbow: Secondary | ICD-10-CM

## 2013-08-15 DIAGNOSIS — K219 Gastro-esophageal reflux disease without esophagitis: Secondary | ICD-10-CM

## 2013-08-15 DIAGNOSIS — B20 Human immunodeficiency virus [HIV] disease: Secondary | ICD-10-CM

## 2013-08-15 DIAGNOSIS — Z23 Encounter for immunization: Secondary | ICD-10-CM

## 2013-08-15 LAB — COMPLETE METABOLIC PANEL WITH GFR
ALT: 23 U/L (ref 0–35)
BUN: 13 mg/dL (ref 6–23)
CO2: 29 mEq/L (ref 19–32)
Calcium: 9.7 mg/dL (ref 8.4–10.5)
Chloride: 103 mEq/L (ref 96–112)
Creat: 0.93 mg/dL (ref 0.50–1.10)
GFR, Est African American: 84 mL/min
GFR, Est Non African American: 73 mL/min
Glucose, Bld: 93 mg/dL (ref 70–99)

## 2013-08-15 LAB — CBC WITH DIFFERENTIAL/PLATELET
Basophils Relative: 1 % (ref 0–1)
Eosinophils Absolute: 0.5 10*3/uL (ref 0.0–0.7)
Lymphs Abs: 4.2 10*3/uL — ABNORMAL HIGH (ref 0.7–4.0)
MCH: 28.1 pg (ref 26.0–34.0)
Neutrophils Relative %: 43 % (ref 43–77)
Platelets: 446 10*3/uL — ABNORMAL HIGH (ref 150–400)
RBC: 4.48 MIL/uL (ref 3.87–5.11)

## 2013-08-15 MED ORDER — HYDROCODONE-ACETAMINOPHEN 5-325 MG PO TABS: 1.0000 | ORAL_TABLET | Freq: Three times a day (TID) | ORAL | Status: DC | PRN

## 2013-08-15 MED ORDER — CYCLOBENZAPRINE HCL 5 MG PO TABS: 5.0000 mg | ORAL_TABLET | Freq: Three times a day (TID) | ORAL | Status: DC | PRN

## 2013-08-15 NOTE — Progress Notes (Signed)
Subjective:    Patient ID: [name], female    DOB: 1965/11/07, 47 y.o.   MRN: [id number]  HPI   47 year old African American lady with HIV and AIDS who this  once again been able to perfectly suppress her viral load to nearly undetectable levels on prezista, norvir and truvada and reconstitute her CD4 count now to >400  I had  changed her from P/N/T to [place].  Since having made this change she has noted pain in her elbows bilaterally and a rash on dorsum of her arms that has not worsened.  We discussed changing back to P/N/T but initially  she wished to remain on new regimen as HA less. Ultimately she later called and asked to be switched back to Prezista Norvir Truvada and we did so. She is tolerating this medicine fine and she states that she's been on it for least a month. She denies missing any doses of medications.   She is again suffering from headaches she attributes to the Norvir in part also to flank pain from time to time.  Review of Systems  Constitutional: Negative for fever, chills, diaphoresis, activity change, appetite change, fatigue and unexpected weight change.  HENT: Negative for congestion, rhinorrhea, sinus pressure, sneezing, sore throat and trouble swallowing.   Eyes: Negative for photophobia and visual disturbance.  Respiratory: Negative for cough, chest tightness, shortness of breath, wheezing and stridor.   Cardiovascular: Negative for chest pain, palpitations and leg swelling.  Gastrointestinal: Negative for nausea, vomiting, abdominal pain, diarrhea, constipation, blood in stool, abdominal distention and anal bleeding.  Genitourinary: Negative for dysuria, hematuria, flank pain and difficulty urinating.  Musculoskeletal: Negative for arthralgias, back pain, gait problem, joint swelling and myalgias.  Skin: Negative for color change, pallor, rash and wound.  Neurological: Negative for dizziness, tremors, weakness and light-headedness.    Hematological: Negative for adenopathy. Does not bruise/bleed easily.  Psychiatric/Behavioral: Negative for behavioral problems, confusion, sleep disturbance, dysphoric mood, decreased concentration and agitation.       Objective:   Physical Exam  Constitutional: She is oriented to person, place, and time. She appears well-developed and well-nourished. No distress.  HENT:  Head: Normocephalic and atraumatic.  Mouth/Throat: Oropharynx is clear and moist. No oropharyngeal exudate.  Eyes: Conjunctivae and EOM are normal. Pupils are equal, round, and reactive to light. No scleral icterus.  Neck: Normal range of motion. Neck supple. No JVD present.  Cardiovascular: Normal rate, regular rhythm and normal heart sounds.  Exam reveals no gallop and no friction rub.   No murmur heard. Pulmonary/Chest: Effort normal and breath sounds normal. No respiratory distress. She has no wheezes. She has no rales. She exhibits no tenderness.  Abdominal: She exhibits no distension and no mass. There is no tenderness. There is no rebound and no guarding.  Musculoskeletal: She exhibits no edema and no tenderness.       Arms: Lymphadenopathy:    She has no cervical adenopathy.  Neurological: She is alert and oriented to person, place, and time. She has normal reflexes. She exhibits normal muscle tone. Coordination normal.  Skin: Skin is warm and dry. She is not diaphoretic. No erythema. No pallor.  Psychiatric: She has a normal mood and affect. Her behavior is normal. Judgment and thought content normal.          Assessment & Plan:  HIV: Switched back to resistant Norvir Truvada we'll continue this.   Elbow pain and rash: Resolved Nausea: She is now not suffering somewhat  similar symptom anymore. Obesity: working on this with exercise.    Headaches: She is taking prescription Vicodin for this.  GERD: ppi to be continued  HCM: flu shot

## 2013-08-16 LAB — T-HELPER CELL (CD4) - (RCID CLINIC ONLY): CD4 % Helper T Cell: 12 % — ABNORMAL LOW (ref 33–55)

## 2013-08-16 LAB — HIV-1 RNA QUANT-NO REFLEX-BLD: HIV-1 RNA Quant, Log: 1.46 {Log} — ABNORMAL HIGH (ref <1.30)

## 2013-09-03 ENCOUNTER — Telehealth: Payer: Self-pay | Admitting: *Deleted

## 2013-09-03 NOTE — Telephone Encounter (Signed)
Patient called and advised she had some teeth pulled on Thursday 08/30/13 and woke up 09/01/13 with an abcess and swollen face. She has some Bactrim DS left from an RX she had and she wanted to know if she could take it to treat the infection in her face. I asked her if she could go back to or call the dentist she saw last week and she advised no. I advised her she could come in to see the doctor but she advised she lives in [place] and has a hard time with transportation. I advised her I can not advise her to take the antibiotic and that she will need to be seen before we can prescribe anything else or she could call the dentist she saw.

## 2013-10-15 ENCOUNTER — Telehealth: Payer: Self-pay | Admitting: *Deleted

## 2013-10-15 ENCOUNTER — Other Ambulatory Visit (INDEPENDENT_AMBULATORY_CARE_PROVIDER_SITE_OTHER): Payer: PRIVATE HEALTH INSURANCE

## 2013-10-15 ENCOUNTER — Other Ambulatory Visit: Payer: Self-pay | Admitting: Infectious Disease

## 2013-10-15 DIAGNOSIS — B20 Human immunodeficiency virus [HIV] disease: Secondary | ICD-10-CM

## 2013-10-15 DIAGNOSIS — Z79899 Other long term (current) drug therapy: Secondary | ICD-10-CM

## 2013-10-15 LAB — CBC WITH DIFFERENTIAL/PLATELET
Eosinophils Absolute: 0.4 10*3/uL (ref 0.0–0.7)
Eosinophils Relative: 5 % (ref 0–5)
Hemoglobin: 12.5 g/dL (ref 12.0–15.0)
Lymphs Abs: 3.9 10*3/uL (ref 0.7–4.0)
MCH: 27.5 pg (ref 26.0–34.0)
MCV: 84.4 fL (ref 78.0–100.0)
Monocytes Relative: 7 % (ref 3–12)
Neutro Abs: 3.6 10*3/uL (ref 1.7–7.7)
Neutrophils Relative %: 42 % — ABNORMAL LOW (ref 43–77)
Platelets: 439 10*3/uL — ABNORMAL HIGH (ref 150–400)
RBC: 4.55 MIL/uL (ref 3.87–5.11)
WBC: 8.6 10*3/uL (ref 4.0–10.5)

## 2013-10-15 LAB — COMPLETE METABOLIC PANEL WITH GFR
AST: 20 U/L (ref 0–37)
Albumin: 4.5 g/dL (ref 3.5–5.2)
CO2: 27 mEq/L (ref 19–32)
Calcium: 9.8 mg/dL (ref 8.4–10.5)
Chloride: 106 mEq/L (ref 96–112)
GFR, Est African American: 81 mL/min
GFR, Est Non African American: 70 mL/min
Glucose, Bld: 98 mg/dL (ref 70–99)
Sodium: 140 mEq/L (ref 135–145)
Total Bilirubin: 0.4 mg/dL (ref 0.3–1.2)
Total Protein: 7.1 g/dL (ref 6.0–8.3)

## 2013-10-15 NOTE — Addendum Note (Signed)
Addended by: Mariea Clonts D on: 10/15/2013 02:18 PM   Modules accepted: Orders

## 2013-10-15 NOTE — Telephone Encounter (Signed)
Patient came to pick up her Norco 5/325 #90 R0 prescription.  Per patient, she takes 1 tablet daily (or 1/2 tablet twice daily) and needs only about #30/month. Patient has transportation issues and cannot come monthly to pick up a pain prescription.  Patient spoke with Dr. Daiva Eves, gave urine sample, signed for Vicodin #90.  They will discuss further refills at her next upcoming appointment. Andree Coss, RN

## 2013-10-15 NOTE — Addendum Note (Signed)
Addended by: Andree Coss on: 10/15/2013 12:38 PM   Modules accepted: Orders

## 2013-10-16 LAB — DRUG SCREEN, URINE
Amphetamine Screen, Ur: NEGATIVE
Barbiturate Quant, Ur: NEGATIVE
Benzodiazepines.: NEGATIVE
Cocaine Metabolites: NEGATIVE
Creatinine,U: 136.08 mg/dL
Opiates: NEGATIVE
Phencyclidine (PCP): NEGATIVE
Propoxyphene: NEGATIVE

## 2013-10-16 LAB — T-HELPER CELL (CD4) - (RCID CLINIC ONLY)
CD4 % Helper T Cell: 13 % — ABNORMAL LOW (ref 33–55)
CD4 T Cell Abs: 550 /uL (ref 400–2700)

## 2013-10-16 LAB — HIV-1 RNA QUANT-NO REFLEX-BLD
HIV 1 RNA Quant: <20 copies/mL (ref <20)
HIV-1 RNA Quant, Log: <1.3 {Log} (ref <1.30)

## 2013-10-17 LAB — OPIATES/OPIOIDS (LC/MS-MS)
Codeine Urine: NEGATIVE ng/mL
Heroin (6-AM), UR: NEGATIVE ng/mL
Hydrocodone: NEGATIVE ng/mL
Oxycodone, ur: NEGATIVE ng/mL
Oxymorphone: NEGATIVE ng/mL

## 2013-10-29 ENCOUNTER — Telehealth: Payer: Self-pay | Admitting: *Deleted

## 2013-10-29 NOTE — Telephone Encounter (Signed)
Patient called and advised that she travled over the holiday and was seen in the ED for Flu. She advised they gave her a Rx for Oxy but she did not have it filled because she is on a contract with [place] she is going to bring the Rx in at her next visit. She also advised she is in a lot of pain down her left side like she has never felt before. She has a bad cough and has been vomiting all night. She advised it hurts to move but thinks it is from the vomiting she has been doing. Advised her it could be but if she does not get to feeling better or the pain gets worse she can go to the ED. Also advised her to drink plenty of water and get some rest and to call if she has any questions.

## 2013-11-02 ENCOUNTER — Other Ambulatory Visit: Payer: Self-pay | Admitting: Infectious Disease

## 2013-11-07 ENCOUNTER — Encounter: Payer: Self-pay | Admitting: Infectious Disease

## 2013-11-07 ENCOUNTER — Ambulatory Visit (INDEPENDENT_AMBULATORY_CARE_PROVIDER_SITE_OTHER): Payer: PRIVATE HEALTH INSURANCE | Admitting: Infectious Disease

## 2013-11-07 VITALS — BP 136/92 | HR 81 | Wt 218.2 lb

## 2013-11-07 DIAGNOSIS — R51 Headache: Secondary | ICD-10-CM

## 2013-11-07 DIAGNOSIS — B20 Human immunodeficiency virus [HIV] disease: Secondary | ICD-10-CM

## 2013-11-07 DIAGNOSIS — F1920 Other psychoactive substance dependence, uncomplicated: Secondary | ICD-10-CM

## 2013-11-07 MED ORDER — HYDROCODONE-ACETAMINOPHEN 5-325 MG PO TABS: 1.0000 | ORAL_TABLET | Freq: Every day | ORAL | Status: DC | PRN

## 2013-11-07 MED ORDER — ETRAVIRINE 200 MG PO TABS: 400.0000 mg | ORAL_TABLET | Freq: Every day | ORAL | Status: DC

## 2013-11-07 NOTE — Patient Instructions (Signed)
YOUR NEW HIV REGIMEN IS  TWO INTELENCE (ETRAVIRINE) 200MG  TABLETS = 400MG  ONCE DAILY  WITH  TRUVADA ONCE DAILY  YOU SHOULD NOT HAVE HEADACHES OR ANY OTHER SIGNIFICANT SIDE EFFECTS ON THIS REGIMEN

## 2013-11-07 NOTE — Progress Notes (Signed)
Subjective:    Patient ID: [name], female    DOB: 01-Jun-1966, 48 y.o.   MRN: [id number]  HPI   48 year old African American lady with HIV and AIDS who this  once again been able to perfectly suppress her viral load to <20 on prezista, norvir and truvada and reconstitute her CD4 count now to >500  I had  changed her from P/N/T to [place] and truvada.  Since having made this change she has noted pain in her elbows bilaterally and a rash on dorsum of her arms that has not worsened.  We changed back to P/N/T but initially    While on thiss she is again suffering from headaches she attributes to the Norvir in part also to flank pain from time to time.  She came in for hydrocodone script on the 15th of December stating that she took one to half tablet a day having been given rx for #90 in October  Her urine drug drug tox screen at that time was positive for marijuana but negative for hydrocodone.  [place] and diagnosed with influenza and given a prescription for oxycodone. She also apparently was given a bottle with oxycodone pills and she brings us to us today. The bowel. She has no one to name on it and has a date of December 3. She states she has not taken any of the oxycodone were old and new narcotics protrusion as she did not want violated pain contracts.  Modifier with the fact that her tox screen was negative for oxycodone and hydrocodone when tested in December she then stated that she had not had any of these medications for the first 2 weeks of December.  We reviewed the reasons that she has been taking hydrocodone or Vicodin and that being that she has headaches that she attributes to the Norvir. She apparently did not have these headaches while on TIVICAY and Truvada but had other symptoms that she could not tolerate  I therefore propose to change her to a different regimen namely inteLence and Truvada so that she would  not need any more Vicodin.  She also claims she is having left-sided flank pain which he believes is due to the coughing due to her influenza infection.  Review of Systems  Constitutional: Negative for fever, chills, diaphoresis, activity change, appetite change, fatigue and unexpected weight change.  HENT: Negative for congestion, rhinorrhea, sinus pressure, sneezing, sore throat and trouble swallowing.   Eyes: Negative for photophobia and visual disturbance.  Respiratory: Negative for cough, chest tightness, shortness of breath, wheezing and stridor.   Cardiovascular: Negative for chest pain, palpitations and leg swelling.  Gastrointestinal: Negative for nausea, vomiting, abdominal pain, diarrhea, constipation, blood in stool, abdominal distention and anal bleeding.  Genitourinary: Negative for dysuria, hematuria, flank pain and difficulty urinating.  Musculoskeletal: Negative for arthralgias, back pain, gait problem, joint swelling and myalgias.  Skin: Negative for color change, pallor, rash and wound.  Neurological: Negative for dizziness, tremors, weakness and light-headedness.  Hematological: Negative for adenopathy. Does not bruise/bleed easily.  Psychiatric/Behavioral: Negative for behavioral problems, confusion, sleep disturbance, dysphoric mood, decreased concentration and agitation.       Objective:   Physical Exam  Constitutional: She is oriented to person, place, and time. She appears well-developed and well-nourished. No distress.  HENT:  Head: Normocephalic and atraumatic.  Mouth/Throat: Oropharynx is clear and moist. No oropharyngeal exudate.  Eyes: Conjunctivae and EOM are normal. Pupils  are equal, round, and reactive to light. No scleral icterus.  Neck: Normal range of motion. Neck supple. No JVD present.  Cardiovascular: Normal rate, regular rhythm and normal heart sounds.  Exam reveals no gallop and no friction rub.   No murmur heard. Pulmonary/Chest: Effort normal  and breath sounds normal. No respiratory distress. She has no wheezes. She has no rales. She exhibits no tenderness.  Abdominal: She exhibits no distension and no mass. There is no tenderness. There is no rebound and no guarding.  Musculoskeletal: She exhibits no edema and no tenderness.       Arms: Lymphadenopathy:    She has no cervical adenopathy.  Neurological: She is alert and oriented to person, place, and time. She has normal reflexes. She exhibits normal muscle tone. Coordination normal.  Skin: Skin is warm and dry. She is not diaphoretic. No erythema. No pallor.  Psychiatric: She has a normal mood and affect. Her behavior is normal. Judgment and thought content normal.          Assessment & Plan:  HIV: I. will change her to INTELENCE 400mg  and truvada and she would no longer be on Norvir and therefore presumably would no longer have headaches. We'll recheck a viral load and CD4 count her next visit.  Headaches: She has been taking prescription Vicodin for this. If these are truly due to Norvir as she contends that she should no longer need Vicodin. Vicodin narcotics for the more not recommended by most neurologists for the management of headaches. If she still is having headaches while off of Norvir I will refer to neurology clinic to get her on more optimal drugs to manage her chronic headaches. In interim I will give her prescription for Vicodin #30 and do it confirmatory opiate and drug tox screen today.  Flank pain: I cannot see any reason for stronger narcotics for this and that she can take over-the-counter nonsteroidals for this if needed. This is likely a musculoskeletal injury  Chronic opioid use: See above her tox screen was negative for hydrocodone when checked in December she contends that she was not taking the medicines for the first 2 weeks having a final hard to believe given that she was given 90 tablets and stated that she'll he took one to one half tablet per day. We  will check tox screen today and confirmatory screen for opiates. I spent greater than 45 minutes with the patient including greater than 50% of time in face to face counsel of the patient and in coordination of their care.

## 2013-11-08 LAB — DRUG SCREEN, URINE
Amphetamine Screen, Ur: NEGATIVE
BARBITURATE QUANT UR: NEGATIVE
Benzodiazepines.: NEGATIVE
COCAINE METABOLITES: NEGATIVE
CREATININE, U: 101.02 mg/dL
MARIJUANA METABOLITE: POSITIVE — AB
Methadone: NEGATIVE
OPIATES: POSITIVE — AB
Phencyclidine (PCP): NEGATIVE
Propoxyphene: NEGATIVE

## 2013-11-12 ENCOUNTER — Telehealth: Payer: Self-pay | Admitting: *Deleted

## 2013-11-12 LAB — OPIATES/OPIOIDS (LC/MS-MS)
CODEINE URINE: NEGATIVE ng/mL
HYDROCODONE: 897 ng/mL — AB
Heroin (6-AM), UR: NEGATIVE ng/mL
Hydromorphone: 141 ng/mL — ABNORMAL HIGH
MORPHINE: NEGATIVE ng/mL
Norhydrocodone, Ur: 96 ng/mL — ABNORMAL HIGH
Noroxycodone, Ur: NEGATIVE ng/mL
Oxycodone, ur: NEGATIVE ng/mL
Oxymorphone: NEGATIVE ng/mL

## 2013-11-12 NOTE — Telephone Encounter (Signed)
Wanting to make sure that she is taking "new" medication correctly.  Stated that she is to take 2 Intellence tablets with the Truvada.  RN advised that this was correct.  She plans to take these in the evening so that if she does have side effects she will be asleep.  RN praised her for calling with her question and timing of her medications.

## 2013-11-16 ENCOUNTER — Telehealth: Payer: Self-pay | Admitting: *Deleted

## 2013-11-16 NOTE — Telephone Encounter (Signed)
Patient was diagnosed with the flu 10/24/13 and she is c/o ongoing nasal congestion and cough. She has been taking OTC Alkaseltser for cold without much relief. Patient is asking for advise on what to take or does she need  to be seen? Kelly MolaJacqueline Kristol Shields

## 2013-11-16 NOTE — Telephone Encounter (Signed)
Have her take nightly Afrin two puffs in each nose.   She can take OTC pseudophed 30mg  every 6 hours as well( will have to show drivers licence when buying this)  Also she can have a prescription for flunisolide steroid inhaler if the above two OTC are not working

## 2013-11-19 ENCOUNTER — Other Ambulatory Visit: Payer: Self-pay | Admitting: Infectious Disease

## 2013-11-19 MED ORDER — FLUNISOLIDE 25 MCG/ACT (0.025%) NA SOLN: 2.0000 | Freq: Every evening | NASAL | Status: DC | PRN

## 2013-11-19 NOTE — Telephone Encounter (Signed)
Patient states she can not take pseudophed, she said it is too strong for her and makes her "jittery". Requesting that you prescribe the flunisolide for her. Patient uses [facility] on [address]. Kelly MolaJacqueline Fionna Merriott

## 2013-11-19 NOTE — Telephone Encounter (Signed)
rx writtent

## 2013-12-06 ENCOUNTER — Other Ambulatory Visit: Payer: PRIVATE HEALTH INSURANCE

## 2013-12-06 DIAGNOSIS — Z113 Encounter for screening for infections with a predominantly sexual mode of transmission: Secondary | ICD-10-CM

## 2013-12-06 DIAGNOSIS — B20 Human immunodeficiency virus [HIV] disease: Secondary | ICD-10-CM

## 2013-12-06 LAB — CBC WITH DIFFERENTIAL/PLATELET
Basophils Absolute: 0.1 10*3/uL (ref 0.0–0.1)
Basophils Relative: 1 % (ref 0–1)
EOS ABS: 0.5 10*3/uL (ref 0.0–0.7)
EOS PCT: 5 % (ref 0–5)
HEMATOCRIT: 42.1 % (ref 36.0–46.0)
Hemoglobin: 13.8 g/dL (ref 12.0–15.0)
LYMPHS ABS: 4.5 10*3/uL — AB (ref 0.7–4.0)
Lymphocytes Relative: 47 % — ABNORMAL HIGH (ref 12–46)
MCH: 27.9 pg (ref 26.0–34.0)
MCHC: 32.8 g/dL (ref 30.0–36.0)
MCV: 85.1 fL (ref 78.0–100.0)
MONO ABS: 0.4 10*3/uL (ref 0.1–1.0)
Monocytes Relative: 5 % (ref 3–12)
Neutro Abs: 4.2 10*3/uL (ref 1.7–7.7)
Neutrophils Relative %: 42 % — ABNORMAL LOW (ref 43–77)
Platelets: 462 10*3/uL — ABNORMAL HIGH (ref 150–400)
RBC: 4.95 MIL/uL (ref 3.87–5.11)
RDW: 14.7 % (ref 11.5–15.5)
WBC: 9.6 10*3/uL (ref 4.0–10.5)

## 2013-12-06 LAB — COMPLETE METABOLIC PANEL WITH GFR
ALBUMIN: 4.2 g/dL (ref 3.5–5.2)
ALK PHOS: 128 U/L — AB (ref 39–117)
ALT: 33 U/L (ref 0–35)
AST: 23 U/L (ref 0–37)
BUN: 13 mg/dL (ref 6–23)
CALCIUM: 10 mg/dL (ref 8.4–10.5)
CHLORIDE: 104 meq/L (ref 96–112)
CO2: 26 mEq/L (ref 19–32)
Creat: 0.91 mg/dL (ref 0.50–1.10)
GFR, Est African American: 86 mL/min
GFR, Est Non African American: 75 mL/min
Glucose, Bld: 104 mg/dL — ABNORMAL HIGH (ref 70–99)
POTASSIUM: 4.4 meq/L (ref 3.5–5.3)
SODIUM: 141 meq/L (ref 135–145)
TOTAL PROTEIN: 7.3 g/dL (ref 6.0–8.3)
Total Bilirubin: 0.3 mg/dL (ref 0.2–1.2)

## 2013-12-07 LAB — T-HELPER CELL (CD4) - (RCID CLINIC ONLY)
CD4 % Helper T Cell: 13 % — ABNORMAL LOW (ref 33–55)
CD4 T Cell Abs: 620 /uL (ref 400–2700)

## 2013-12-07 LAB — HIV-1 RNA QUANT-NO REFLEX-BLD: HIV-1 RNA Quant, Log: <1.3 {Log} (ref <1.30)

## 2013-12-07 LAB — RPR

## 2013-12-20 ENCOUNTER — Ambulatory Visit: Payer: Medicaid Other | Admitting: Infectious Disease

## 2014-01-02 ENCOUNTER — Encounter: Payer: Self-pay | Admitting: Infectious Disease

## 2014-01-02 ENCOUNTER — Ambulatory Visit (INDEPENDENT_AMBULATORY_CARE_PROVIDER_SITE_OTHER): Payer: PRIVATE HEALTH INSURANCE | Admitting: Infectious Disease

## 2014-01-02 ENCOUNTER — Other Ambulatory Visit: Payer: Self-pay | Admitting: Licensed Clinical Social Worker

## 2014-01-02 VITALS — BP 141/92 | HR 85 | Temp 97.9°F | Wt 216.0 lb

## 2014-01-02 DIAGNOSIS — R51 Headache: Secondary | ICD-10-CM

## 2014-01-02 DIAGNOSIS — F1920 Other psychoactive substance dependence, uncomplicated: Secondary | ICD-10-CM

## 2014-01-02 DIAGNOSIS — F1120 Opioid dependence, uncomplicated: Secondary | ICD-10-CM

## 2014-01-02 DIAGNOSIS — G63 Polyneuropathy in diseases classified elsewhere: Secondary | ICD-10-CM

## 2014-01-02 DIAGNOSIS — B20 Human immunodeficiency virus [HIV] disease: Secondary | ICD-10-CM

## 2014-01-02 DIAGNOSIS — G909 Disorder of the autonomic nervous system, unspecified: Secondary | ICD-10-CM

## 2014-01-02 MED ORDER — HYDROCODONE-ACETAMINOPHEN 5-325 MG PO TABS: 1.0000 | ORAL_TABLET | Freq: Every day | ORAL | Status: DC | PRN

## 2014-01-02 NOTE — Progress Notes (Signed)
  Subjective:    Patient ID: [name], female    DOB: 1966-07-11, 48 y.o.   MRN: [id number]  HPI   48 year old African American lady with HIV and AIDS who this  once again been able to perfectly suppress her viral load to <20 on prezista, norvir and truvada, in whom I changed to [place] and truv[place], back to P/N/T and now to Intelence and truvada.  VL is <20 and CD4 >600.  She claims she is still having headaches perhaps even worse than when she was on P/N/T and also pain in hands and feet that sounds possibly neuropathic   Review of Systems  Constitutional: Negative for fever, chills, diaphoresis, activity change, appetite change, fatigue and unexpected weight change.  HENT: Negative for congestion, rhinorrhea, sinus pressure, sneezing, sore throat and trouble swallowing.   Eyes: Negative for photophobia and visual disturbance.  Respiratory: Negative for cough, chest tightness, shortness of breath, wheezing and stridor.   Cardiovascular: Negative for chest pain, palpitations and leg swelling.  Gastrointestinal: Negative for nausea, vomiting, abdominal pain, diarrhea, constipation, blood in stool, abdominal distention and anal bleeding.  Genitourinary: Negative for dysuria, hematuria, flank pain and difficulty urinating.  Musculoskeletal: Negative for arthralgias, back pain, gait problem, joint swelling and myalgias.  Skin: Negative for color change, pallor, rash and wound.  Neurological: Negative for dizziness, tremors, weakness and light-headedness.  Hematological: Negative for adenopathy. Does not bruise/bleed easily.  Psychiatric/Behavioral: Negative for behavioral problems, confusion, sleep disturbance, dysphoric mood, decreased concentration and agitation.       Objective:   Physical Exam  Constitutional: She is oriented to person, place, and time. She appears well-developed and well-nourished. No distress.  HENT:  Head: Normocephalic and atraumatic.  Mouth/Throat:  Oropharynx is clear and moist. No oropharyngeal exudate.  Eyes: Conjunctivae and EOM are normal. Pupils are equal, round, and reactive to light. No scleral icterus.  Neck: Normal range of motion. Neck supple. No JVD present.  Cardiovascular: Normal rate, regular rhythm and normal heart sounds.  Exam reveals no gallop and no friction rub.   No murmur heard. Pulmonary/Chest: Effort normal and breath sounds normal. No respiratory distress. She has no wheezes. She has no rales. She exhibits no tenderness.  Abdominal: She exhibits no distension and no mass. There is no tenderness. There is no rebound and no guarding.  Musculoskeletal: She exhibits no edema and no tenderness.       Arms: Lymphadenopathy:    She has no cervical adenopathy.  Neurological: She is alert and oriented to person, place, and time. She has normal reflexes. She exhibits normal muscle tone. Coordination normal.  Skin: Skin is warm and dry. She is not diaphoretic. No erythema. No pallor.  Psychiatric: She has a normal mood and affect. Her behavior is normal. Judgment and thought content normal.          Assessment & Plan:  HIV: continue INTELENCE 400mg  and truvada and will continue this, after we had extensive discussion re possible other regimens   Headaches: I will refer to neurology clinic to get her on more optimal drugs to manage her chronic headaches. In interim I will give her prescription for Vicodin #30 and do it confirmatory opiate and drug tox screens.  Chronic opioid use: See prior note re discussion of this. OK to do #30 for now but she understands that I DONT feel this is a good drug for her long term for her headaches.

## 2014-03-05 ENCOUNTER — Encounter: Payer: Self-pay | Admitting: Infectious Disease

## 2014-03-05 ENCOUNTER — Telehealth: Payer: Self-pay | Admitting: Infectious Disease

## 2014-03-05 DIAGNOSIS — G43009 Migraine without aura, not intractable, without status migrainosus: Secondary | ICD-10-CM | POA: Insufficient documentation

## 2014-03-05 NOTE — Telephone Encounter (Signed)
I received a form for [place] [employer] [facility] re [employer] of Medical need with phone number from Randolm IdolPam Jones [phone].  I NEED TO KNOW WHAT KIND OF HELP Anahli IS SEEKING AND HOW THAT IS IMPACTING HER FUNCTIONING  WE NEED TO CLARIFY WITH PAM JONES AND Leisha WHAT IS BEING SOUGHT HERE AND WHAT IS THOUGHT TO BE JUSTIFICATION FOR NEED

## 2014-03-06 NOTE — Telephone Encounter (Signed)
Spoke with Kelly DolinPat Jones and was told patient contacted her home health agency and one home visit was done. Dennie Bibleat did not notice anything impacting patient's ability to perform her household tasks. But patient is requesting home care to assist with cleaning, cooking, and other household chores. She told the home health agency that sometimes she does not feel up to cleaning her home and shopping for necessities. Wendall MolaJacqueline Adrian Specht

## 2014-03-07 NOTE — Telephone Encounter (Signed)
I really like [name] and care about her a great deal but I am afraid I cannot substantiate a need for home heallh based on this infomration alone. Perhaps there is more going on that we dont know about? COuld she come in and meet with [name] ? ----- Message -----  Notified Pat at home health services that Dr. Daiva EvesVan Shields is unable to fill out the form for [facility]. She said that the patient was not expecting him to because she never talked about needing any help.

## 2014-04-17 ENCOUNTER — Other Ambulatory Visit: Payer: Self-pay | Admitting: Infectious Disease

## 2014-04-19 ENCOUNTER — Other Ambulatory Visit: Payer: Self-pay | Admitting: Licensed Clinical Social Worker

## 2014-04-19 DIAGNOSIS — B009 Herpesviral infection, unspecified: Secondary | ICD-10-CM

## 2014-04-19 MED ORDER — VALACYCLOVIR HCL 1 G PO TABS: 1000.0000 mg | ORAL_TABLET | Freq: Every day | ORAL | Status: DC

## 2014-04-23 ENCOUNTER — Other Ambulatory Visit: Payer: PRIVATE HEALTH INSURANCE

## 2014-05-07 ENCOUNTER — Ambulatory Visit: Payer: PRIVATE HEALTH INSURANCE | Admitting: Infectious Disease

## 2014-05-08 ENCOUNTER — Other Ambulatory Visit: Payer: Self-pay | Admitting: Infectious Disease

## 2014-05-08 DIAGNOSIS — K219 Gastro-esophageal reflux disease without esophagitis: Secondary | ICD-10-CM

## 2014-05-21 ENCOUNTER — Other Ambulatory Visit (HOSPITAL_COMMUNITY)
Admission: RE | Admit: 2014-05-21 | Discharge: 2014-05-21 | Disposition: A | Discharge status: Home / Self Care | Payer: PRIVATE HEALTH INSURANCE | Source: Ambulatory Visit | Attending: Infectious Disease | Admitting: Infectious Disease

## 2014-05-21 ENCOUNTER — Ambulatory Visit (INDEPENDENT_AMBULATORY_CARE_PROVIDER_SITE_OTHER): Payer: PRIVATE HEALTH INSURANCE | Admitting: Infectious Disease

## 2014-05-21 ENCOUNTER — Encounter: Payer: Self-pay | Admitting: Infectious Disease

## 2014-05-21 VITALS — BP 118/86 | HR 90 | Temp 98.0°F | Wt 214.0 lb

## 2014-05-21 DIAGNOSIS — R1115 Cyclical vomiting syndrome unrelated to migraine: Secondary | ICD-10-CM

## 2014-05-21 DIAGNOSIS — B20 Human immunodeficiency virus [HIV] disease: Secondary | ICD-10-CM

## 2014-05-21 DIAGNOSIS — Z113 Encounter for screening for infections with a predominantly sexual mode of transmission: Secondary | ICD-10-CM | POA: Insufficient documentation

## 2014-05-21 DIAGNOSIS — G44029 Chronic cluster headache, not intractable: Secondary | ICD-10-CM

## 2014-05-21 LAB — RPR

## 2014-05-21 MED ORDER — AMITRIPTYLINE HCL 25 MG PO TABS: 25.0000 mg | ORAL_TABLET | Freq: Every day | ORAL | Status: DC

## 2014-05-21 NOTE — Progress Notes (Signed)
Subjective:    Patient ID: [name], female    DOB: June 25, 1966, 48 y.o.   MRN: [id number]  HPI   48 year old African American lady with HIV and AIDS who this  once again been able to perfectly suppress her viral load to <20 on prezista, norvir and truvada, in whom I changed to [place], back to P/N/T and now to Intelence and truvada.  VL is <20 and CD4 >600.  She had claimed at that time that she is still having headaches perhaps even worse than when she was on P/N/T and also pain in hands and feet that sounds possibly neuropathic  I then changed her to 400mg  of intelence with Truvada. I told her she could dissolve the intelence but she dislikes the taste in apple juice or water when she tries to swallow they get stuck in her mouth and bad taste causes her to vomit them up 1-2 times per week. Otherwise been taking her meds  She still has headaches from neck forward that sound like tension headache. She has not been given narcotics since March for headaches.   Review of Systems  Constitutional: Negative for fever, chills, diaphoresis, activity change, appetite change, fatigue and unexpected weight change.  HENT: Negative for congestion, rhinorrhea, sinus pressure, sneezing, sore throat and trouble swallowing.   Eyes: Negative for photophobia and visual disturbance.  Respiratory: Negative for cough, chest tightness, shortness of breath, wheezing and stridor.   Cardiovascular: Negative for chest pain, palpitations and leg swelling.  Gastrointestinal: Negative for nausea, vomiting, abdominal pain, diarrhea, constipation, blood in stool, abdominal distention and anal bleeding.  Genitourinary: Negative for dysuria, hematuria, flank pain and difficulty urinating.  Musculoskeletal: Negative for arthralgias, back pain, gait problem, joint swelling and myalgias.  Skin: Negative for color change, pallor, rash and wound.  Neurological: Negative for dizziness, tremors, weakness and  light-headedness.  Hematological: Negative for adenopathy. Does not bruise/bleed easily.  Psychiatric/Behavioral: Negative for behavioral problems, confusion, sleep disturbance, dysphoric mood, decreased concentration and agitation.       Objective:   Physical Exam  Constitutional: She is oriented to person, place, and time. She appears well-developed and well-nourished. No distress.  HENT:  Head: Normocephalic and atraumatic.  Mouth/Throat: Oropharynx is clear and moist. No oropharyngeal exudate.  Eyes: Conjunctivae and EOM are normal. Pupils are equal, round, and reactive to light. No scleral icterus.  Neck: Normal range of motion. Neck supple. No JVD present.  Cardiovascular: Normal rate, regular rhythm and normal heart sounds.  Exam reveals no gallop and no friction rub.   No murmur heard. Pulmonary/Chest: Effort normal and breath sounds normal. No respiratory distress. She has no wheezes. She has no rales. She exhibits no tenderness.  Abdominal: She exhibits no distension and no mass. There is no tenderness. There is no rebound and no guarding.  Musculoskeletal: She exhibits no edema and no tenderness.  Lymphadenopathy:    She has no cervical adenopathy.  Neurological: She is alert and oriented to person, place, and time. She has normal reflexes. She exhibits normal muscle tone. Coordination normal.  Skin: Skin is warm and dry. She is not diaphoretic. No erythema. No pallor.  Psychiatric: She has a normal mood and affect. Her behavior is normal. Judgment and thought content normal.          Assessment & Plan:  HIV: recheck VL and CD4 today. Will consider different regimen once we have data back. I spent greater than 40  minutes with the patient including  greater than 50% of time in face to face counsel of the patient and in coordination of their care.   Headaches:  Tension headaches. Will start amitriptiline for prophylaxis and can take caffeine, asa for acute flares and needs  to see Neurology if still a problem  Chronic opioid use: she is not been given rx since March and I am going down a different route with meds.

## 2014-05-21 NOTE — Patient Instructions (Signed)
Ok to overbook in 2 weeks

## 2014-05-22 LAB — T-HELPER CELL (CD4) - (RCID CLINIC ONLY)
CD4 T CELL HELPER: 15 % — AB (ref 33–55)
CD4 T Cell Abs: 580 /uL (ref 400–2700)

## 2014-05-25 LAB — HIV-1 RNA ULTRAQUANT REFLEX TO GENTYP+
HIV 1 RNA Quant: 94 copies/mL — ABNORMAL HIGH (ref <20)
HIV-1 RNA Quant, Log: 1.97 {Log} — ABNORMAL HIGH (ref <1.30)

## 2014-06-03 ENCOUNTER — Encounter: Payer: Self-pay | Admitting: Infectious Disease

## 2014-06-03 ENCOUNTER — Ambulatory Visit (INDEPENDENT_AMBULATORY_CARE_PROVIDER_SITE_OTHER): Payer: PRIVATE HEALTH INSURANCE | Admitting: Infectious Disease

## 2014-06-03 VITALS — BP 136/92 | HR 78 | Temp 98.2°F | Wt 216.0 lb

## 2014-06-03 DIAGNOSIS — F4321 Adjustment disorder with depressed mood: Secondary | ICD-10-CM

## 2014-06-03 DIAGNOSIS — B20 Human immunodeficiency virus [HIV] disease: Secondary | ICD-10-CM

## 2014-06-03 DIAGNOSIS — G4700 Insomnia, unspecified: Secondary | ICD-10-CM

## 2014-06-03 DIAGNOSIS — R51 Headache: Secondary | ICD-10-CM

## 2014-06-03 DIAGNOSIS — F4310 Post-traumatic stress disorder, unspecified: Secondary | ICD-10-CM

## 2014-06-03 MED ORDER — PROMETHAZINE HCL 25 MG PO TABS: 12.5000 mg | ORAL_TABLET | Freq: Three times a day (TID) | ORAL | Status: DC | PRN

## 2014-06-03 MED ORDER — ELVITEG-COBIC-EMTRICIT-TENOFDF 150-150-200-300 MG PO TABS: 1.0000 | ORAL_TABLET | Freq: Every day | ORAL | Status: DC

## 2014-06-03 NOTE — Progress Notes (Signed)
Subjective:    Patient ID: Alphia MohSharon Sterba, female    DOB: Oct 23, 1966, 48 y.o.   MRN: [id number]  HPI   48 year old African American lady with HIV and AIDS who this  once again been able to perfectly suppress her viral load to <20 on prezista, norvir and truvada, in whom I changed to [place] and truvada, back to P/N/T and now to Intelence and truvada.  I then changed her to 400mg  of intelence with Truvada. I told her she could dissolve the intelence but she dislikes the taste in apple juice or water when she tries to swallow they get stuck in her mouth and bad taste causes her to vomit them up 1-2 times per week. Otherwise been taking her meds  Lab Results  Component Value Date   HIV1RNAQUANT 94* 05/21/2014   Lab Results  Component Value Date   CD4TABS 580 05/21/2014   CD4TABS 620 12/06/2013   CD4TABS 550 10/15/2013    She still has headaches from neck forward that sound like tension headache. She has not been given narcotics since March for headaches.  I gave her prescription for amitriptyline which helped her sleep and may have helped her headaches but which made her feel very weird the next day. She could not tolerate this and stopped it.  Breast other antiretroviral regimens today and we decided to change her to [facility].  She also does still suffer from some insomnia waking up at night frequently to take think about her son who was murdered several years ago.   Review of Systems  Constitutional: Negative for fever, chills, diaphoresis, activity change, appetite change, fatigue and unexpected weight change.  HENT: Negative for congestion, rhinorrhea, sinus pressure, sneezing, sore throat and trouble swallowing.   Eyes: Negative for photophobia and visual disturbance.  Respiratory: Negative for cough, chest tightness, shortness of breath, wheezing and stridor.   Cardiovascular: Negative for chest pain, palpitations and leg swelling.  Gastrointestinal: Negative for nausea,  vomiting, abdominal pain, diarrhea, constipation, blood in stool, abdominal distention and anal bleeding.  Genitourinary: Negative for dysuria, hematuria, flank pain and difficulty urinating.  Musculoskeletal: Negative for arthralgias, back pain, gait problem, joint swelling and myalgias.  Skin: Negative for color change, pallor, rash and wound.  Neurological: Negative for dizziness, tremors, weakness and light-headedness.  Hematological: Negative for adenopathy. Does not bruise/bleed easily.  Psychiatric/Behavioral: Negative for behavioral problems, confusion, sleep disturbance, dysphoric mood, decreased concentration and agitation.       Objective:   Physical Exam  Constitutional: She is oriented to person, place, and time. She appears well-developed and well-nourished. No distress.  HENT:  Head: Normocephalic and atraumatic.  Mouth/Throat: Oropharynx is clear and moist. No oropharyngeal exudate.  Eyes: Conjunctivae and EOM are normal. Pupils are equal, round, and reactive to light. No scleral icterus.  Neck: Normal range of motion. Neck supple. No JVD present.  Cardiovascular: Normal rate, regular rhythm and normal heart sounds.  Exam reveals no gallop and no friction rub.   No murmur heard. Pulmonary/Chest: Effort normal and breath sounds normal. No respiratory distress. She has no wheezes. She has no rales. She exhibits no tenderness.  Abdominal: She exhibits no distension and no mass. There is no tenderness. There is no rebound and no guarding.  Musculoskeletal: She exhibits no edema and no tenderness.  Lymphadenopathy:    She has no cervical adenopathy.  Neurological: She is alert and oriented to person, place, and time. She has normal reflexes. She exhibits normal muscle tone. Coordination normal.  Skin: Skin is warm and dry. She is not diaphoretic. No erythema. No pallor.  Psychiatric: She has a normal mood and affect. Her behavior is normal. Judgment and thought content normal.           Assessment & Plan:  HIV: Change to STRIBILD recheck viral load CD4 count and basic labs in 2 months time. I spent greater than 25 minutes with the patient including greater than 50% of time in face to face counsel of the patient and in coordination of their care.   Headaches:  Tension headaches. Use OTC meds. And will refer to Neuro, she wants to hold off for now  Insomnia: She will continue some over-the-counter meds for now can consider Ambien in the future.

## 2014-06-10 ENCOUNTER — Telehealth: Payer: Self-pay | Admitting: *Deleted

## 2014-06-10 NOTE — Telephone Encounter (Signed)
I think she should just go back to her most recent regimen

## 2014-06-10 NOTE — Telephone Encounter (Signed)
Patient called and advised that since starting Stribild she has had several muscle cramps. She advised she had one last night and it was very painful and woke her from her sleep. She advised she can not take the frequent cramps and wants to know if she could be changed to something else. Advised her will let her doctor know what is going on and call her back asap.

## 2014-06-11 ENCOUNTER — Telehealth: Payer: Self-pay | Admitting: *Deleted

## 2014-06-11 NOTE — Telephone Encounter (Signed)
Per Dr Daiva EvesVan Dam called patient and informed her to restart her old regimen of Truvada and Intellence and call if she has any questions or needs refills.

## 2014-08-13 ENCOUNTER — Other Ambulatory Visit: Payer: PRIVATE HEALTH INSURANCE

## 2014-08-28 ENCOUNTER — Ambulatory Visit: Payer: PRIVATE HEALTH INSURANCE | Admitting: Infectious Disease

## 2014-08-30 ENCOUNTER — Encounter: Payer: Self-pay | Admitting: Infectious Disease

## 2014-08-30 ENCOUNTER — Other Ambulatory Visit (HOSPITAL_COMMUNITY)
Admission: RE | Admit: 2014-08-30 | Discharge: 2014-08-30 | Disposition: A | Discharge status: Home / Self Care | Payer: PRIVATE HEALTH INSURANCE | Source: Ambulatory Visit | Attending: Infectious Disease | Admitting: Infectious Disease

## 2014-08-30 ENCOUNTER — Other Ambulatory Visit: Payer: Self-pay | Admitting: Infectious Disease

## 2014-08-30 ENCOUNTER — Ambulatory Visit (INDEPENDENT_AMBULATORY_CARE_PROVIDER_SITE_OTHER): Payer: PRIVATE HEALTH INSURANCE | Admitting: Infectious Disease

## 2014-08-30 VITALS — BP 149/97 | HR 76 | Temp 99.0°F | Wt 214.0 lb

## 2014-08-30 DIAGNOSIS — G43009 Migraine without aura, not intractable, without status migrainosus: Secondary | ICD-10-CM

## 2014-08-30 DIAGNOSIS — Z113 Encounter for screening for infections with a predominantly sexual mode of transmission: Secondary | ICD-10-CM

## 2014-08-30 DIAGNOSIS — B20 Human immunodeficiency virus [HIV] disease: Secondary | ICD-10-CM

## 2014-08-30 DIAGNOSIS — G43909 Migraine, unspecified, not intractable, without status migrainosus: Secondary | ICD-10-CM | POA: Insufficient documentation

## 2014-08-30 DIAGNOSIS — Z23 Encounter for immunization: Secondary | ICD-10-CM

## 2014-08-30 DIAGNOSIS — M62838 Other muscle spasm: Secondary | ICD-10-CM

## 2014-08-30 DIAGNOSIS — G43109 Migraine with aura, not intractable, without status migrainosus: Secondary | ICD-10-CM

## 2014-08-30 LAB — T-HELPER CELL (CD4) - (RCID CLINIC ONLY)
CD4 % Helper T Cell: 15 % — ABNORMAL LOW (ref 33–55)
CD4 T CELL ABS: 550 /uL (ref 400–2700)

## 2014-08-30 MED ORDER — METOPROLOL SUCCINATE ER 50 MG PO TB24: 50.0000 mg | ORAL_TABLET | Freq: Every day | ORAL | Status: DC

## 2014-08-30 MED ORDER — SUMATRIPTAN SUCCINATE 50 MG PO TABS: 50.0000 mg | ORAL_TABLET | ORAL | Status: DC | PRN

## 2014-08-30 MED ORDER — CYCLOBENZAPRINE HCL 5 MG PO TABS: 5.0000 mg | ORAL_TABLET | Freq: Three times a day (TID) | ORAL | Status: DC | PRN

## 2014-08-30 NOTE — Patient Instructions (Addendum)
[name] I AM RX IMITREX FOR YOUR HEADACHE YOU CAN TAKE IT UP TO FOUR TIMES A DAY FOR SEVERE HEADACHE BUT NO MORE THAN FOUR TIMES A DAY  YOU SHOULD NOT NEED THIS MEDICINE EVERY DAY  I DO WANT YOU TO ALSO START TOPROL 25MG  XL WHICH SHOULD HELP REDUCE FREQUENCE OF MIGRAINES AND LOWER YOUR BLOOD PRESSURE  YOU SHOULD TAKE THIS MEDICINE EVERY DAY

## 2014-08-30 NOTE — Progress Notes (Signed)
Subjective:    Patient ID: [name], female    DOB: 1965/11/05, 48 y.o.   MRN: [id number]  HPI   48 year old African American lady with HIV and AIDS who this  once again been able to perfectly suppress her viral load to <20 on prezista, norvir and truvada, in whom I changed to [place] and truvada, back to P/N/T and now to Intelence and truvada.  I then changed her to 400mg  of intelence with Truvada. I told her she could dissolve the intelence but she dislikes the taste in apple juice or water  And we therefore changed to STRIBILD.  She is having headaches again and is again requesting for narcotics to treat them as well as muscle relaxants for back pain.    Lab Results  Component Value Date   HIV1RNAQUANT 94* 05/21/2014   Lab Results  Component Value Date   CD4TABS 580 05/21/2014   CD4TABS 620 12/06/2013   CD4TABS 550 10/15/2013      Review of Systems  Constitutional: Negative for fever, chills, diaphoresis, activity change, appetite change, fatigue and unexpected weight change.  HENT: Negative for congestion, rhinorrhea, sinus pressure, sneezing, sore throat and trouble swallowing.   Eyes: Negative for photophobia and visual disturbance.  Respiratory: Negative for cough, chest tightness, shortness of breath, wheezing and stridor.   Cardiovascular: Negative for chest pain, palpitations and leg swelling.  Gastrointestinal: Negative for nausea, vomiting, abdominal pain, diarrhea, constipation, blood in stool, abdominal distention and anal bleeding.  Genitourinary: Negative for dysuria, hematuria, flank pain and difficulty urinating.  Musculoskeletal: Negative for arthralgias, back pain, gait problem, joint swelling and myalgias.  Skin: Negative for color change, pallor, rash and wound.  Neurological: Negative for dizziness, tremors, weakness and light-headedness.  Hematological: Negative for adenopathy. Does not bruise/bleed easily.  Psychiatric/Behavioral: Negative for  behavioral problems, confusion, sleep disturbance, dysphoric mood, decreased concentration and agitation.       Objective:   Physical Exam  Constitutional: She is oriented to person, place, and time. She appears well-developed and well-nourished. No distress.  HENT:  Head: Normocephalic and atraumatic.  Mouth/Throat: Oropharynx is clear and moist. No oropharyngeal exudate.  Eyes: Conjunctivae and EOM are normal. Pupils are equal, round, and reactive to light. No scleral icterus.  Neck: Normal range of motion. Neck supple. No JVD present.  Cardiovascular: Normal rate, regular rhythm and normal heart sounds.  Exam reveals no gallop and no friction rub.   No murmur heard. Pulmonary/Chest: Effort normal and breath sounds normal. No respiratory distress. She has no wheezes. She has no rales. She exhibits no tenderness.  Abdominal: She exhibits no distension and no mass. There is no tenderness. There is no rebound and no guarding.  Musculoskeletal: She exhibits no edema and no tenderness.  Lymphadenopathy:    She has no cervical adenopathy.  Neurological: She is alert and oriented to person, place, and time. She has normal reflexes. She exhibits normal muscle tone. Coordination normal.  Skin: Skin is warm and dry. She is not diaphoretic. No erythema. No pallor.  Psychiatric: She has a normal mood and affect. Her behavior is normal. Judgment and thought content normal.          Assessment & Plan:  HIV: Continue STRIBILD recheck viral load CD4 count a  Headaches:  Tension vs Migraine headaches. Trial Imitrex and start metoprolol long-acting as prophylaxis. And will refer to Neuro, she wants to hold off for now. I spent greater than 25 minutes with the patient including greater than  50% of time in face to face counsel of the patient and in coordination of their care.   Muscle spasms we'll give another prescription for Flexeril

## 2014-08-31 LAB — COMPLETE METABOLIC PANEL WITH GFR
ALT: 20 U/L (ref 0–35)
AST: 15 U/L (ref 0–37)
Albumin: 4.6 g/dL (ref 3.5–5.2)
Alkaline Phosphatase: 116 U/L (ref 39–117)
BUN: 13 mg/dL (ref 6–23)
CO2: 25 meq/L (ref 19–32)
CREATININE: 1.05 mg/dL (ref 0.50–1.10)
Calcium: 9.3 mg/dL (ref 8.4–10.5)
Chloride: 103 mEq/L (ref 96–112)
GFR, EST AFRICAN AMERICAN: 72 mL/min
GFR, EST NON AFRICAN AMERICAN: 63 mL/min
Glucose, Bld: 92 mg/dL (ref 70–99)
Potassium: 4 mEq/L (ref 3.5–5.3)
Sodium: 139 mEq/L (ref 135–145)
Total Bilirubin: 0.4 mg/dL (ref 0.2–1.2)
Total Protein: 7.4 g/dL (ref 6.0–8.3)

## 2014-08-31 LAB — CBC WITH DIFFERENTIAL/PLATELET
Basophils Absolute: 0.1 10*3/uL (ref 0.0–0.1)
Basophils Relative: 1 % (ref 0–1)
EOS ABS: 0.4 10*3/uL (ref 0.0–0.7)
Eosinophils Relative: 5 % (ref 0–5)
HCT: 39.9 % (ref 36.0–46.0)
Hemoglobin: 13.1 g/dL (ref 12.0–15.0)
LYMPHS ABS: 3.6 10*3/uL (ref 0.7–4.0)
LYMPHS PCT: 47 % — AB (ref 12–46)
MCH: 27.4 pg (ref 26.0–34.0)
MCHC: 32.8 g/dL (ref 30.0–36.0)
MCV: 83.5 fL (ref 78.0–100.0)
Monocytes Absolute: 0.4 10*3/uL (ref 0.1–1.0)
Monocytes Relative: 5 % (ref 3–12)
NEUTROS PCT: 42 % — AB (ref 43–77)
Neutro Abs: 3.2 10*3/uL (ref 1.7–7.7)
PLATELETS: 446 10*3/uL — AB (ref 150–400)
RBC: 4.78 MIL/uL (ref 3.87–5.11)
RDW: 14.5 % (ref 11.5–15.5)
WBC: 7.7 10*3/uL (ref 4.0–10.5)

## 2014-08-31 LAB — RPR

## 2014-09-02 ENCOUNTER — Telehealth: Payer: Self-pay | Admitting: *Deleted

## 2014-09-02 ENCOUNTER — Other Ambulatory Visit: Payer: Self-pay | Admitting: Infectious Disease

## 2014-09-02 DIAGNOSIS — B20 Human immunodeficiency virus [HIV] disease: Secondary | ICD-10-CM

## 2014-09-02 LAB — HIV-1 RNA QUANT-NO REFLEX-BLD
HIV 1 RNA QUANT: 1324 {copies}/mL — AB (ref <20)
HIV-1 RNA Quant, Log: 3.12 {Log} — ABNORMAL HIGH (ref <1.30)

## 2014-09-02 LAB — URINE CYTOLOGY ANCILLARY ONLY
Chlamydia: NEGATIVE
Neisseria Gonorrhea: NEGATIVE

## 2014-09-02 NOTE — Telephone Encounter (Signed)
Medicaid needing PA completed for Imitrex rx.  Printed PA and placed in Dr. Zenaida NieceVan Dam's box for completion and signature.

## 2014-09-03 ENCOUNTER — Other Ambulatory Visit: Payer: Self-pay | Admitting: *Deleted

## 2014-09-03 DIAGNOSIS — B20 Human immunodeficiency virus [HIV] disease: Secondary | ICD-10-CM

## 2014-09-03 NOTE — Progress Notes (Signed)
I gave it to Clydie BraunKaren to add

## 2014-09-09 LAB — HIV-1 INTEGRASE GENOTYPE

## 2014-09-10 ENCOUNTER — Ambulatory Visit: Payer: PRIVATE HEALTH INSURANCE | Admitting: Neurology

## 2014-09-10 LAB — HIV-1 GENOTYPR PLUS

## 2014-09-17 ENCOUNTER — Encounter: Payer: Self-pay | Admitting: Neurology

## 2014-09-17 ENCOUNTER — Ambulatory Visit (INDEPENDENT_AMBULATORY_CARE_PROVIDER_SITE_OTHER): Payer: PRIVATE HEALTH INSURANCE | Admitting: Neurology

## 2014-09-17 VITALS — BP 155/94 | HR 81 | Ht 65.0 in | Wt 216.4 lb

## 2014-09-17 DIAGNOSIS — R0681 Apnea, not elsewhere classified: Secondary | ICD-10-CM

## 2014-09-17 DIAGNOSIS — G500 Trigeminal neuralgia: Secondary | ICD-10-CM | POA: Insufficient documentation

## 2014-09-17 DIAGNOSIS — R531 Weakness: Secondary | ICD-10-CM

## 2014-09-17 DIAGNOSIS — R0683 Snoring: Secondary | ICD-10-CM

## 2014-09-17 DIAGNOSIS — B20 Human immunodeficiency virus [HIV] disease: Secondary | ICD-10-CM

## 2014-09-17 DIAGNOSIS — G4719 Other hypersomnia: Secondary | ICD-10-CM

## 2014-09-17 DIAGNOSIS — M5481 Occipital neuralgia: Secondary | ICD-10-CM

## 2014-09-17 DIAGNOSIS — M6289 Other specified disorders of muscle: Secondary | ICD-10-CM

## 2014-09-17 MED ORDER — GABAPENTIN 300 MG PO CAPS: 300.0000 mg | ORAL_CAPSULE | Freq: Three times a day (TID) | ORAL | Status: DC

## 2014-09-17 NOTE — Progress Notes (Addendum)
[facility]    Provider:  Dr Lucia GaskinsAhern Referring Provider: Madison HickmanMartin, Melanie, MD Primary Care Physician:  Madison HickmanMARTIN, MELANIE, MD  CC:  Headache, right hand weakness, right hand numbness, fatigue, drowsiness  HPI:  Kelly MohSharon Shields is a 48 y.o. female here as a referral from Dr. Daphine DeutscherMartin for headaches. PMHx HIV, HTN, Diabetes. Patient has been having headaches for over a year. She has sharp, lancinating, shooting pain on the right side of the head and in the occipital area. A lightning bolt, sharp, twinge, severe when it happens. Unsure what brings on the pain. She has it several times a week, random. Has some light sensitivity, some sound sensitivity, has nausea with the headaches. She is having vision changes. No hearing changes. Has tenderness on the right side of the head and occipital areas. Pain is increased with coughing. Has congestion, lacrimation with the head pain.  Has neck pain but no radicular symptoms. She has elbow pain on the right after trauma and she feels radiation into the hand.  Also with numbness of the right hand. Whole hand is numb and tingling and weak. She has fatigue, no energy. Has weakness of the right hand and decreased coordination. Snores, excessive daytime drowsiness, frequent awakenings, morbid obesity, chokes at night.   Reviewed notes, labs and imaging from outside physicians, which showed; Personally reviewed Ct of the head images which was unremarkable.   Review of Systems: Patient complains of symptoms per HPI as well as the following symptoms numbness, feeling cold, headache. Pertinent negatives per HPI. All others negative.   History   Social History  . Marital Status: Single    Spouse Name: N/A    Number of Children: N/A  . Years of Education: N/A   Occupational History  . Not on file.   Social History Main Topics  . Smoking status: Former Smoker    Types: Cigarettes  . Smokeless tobacco: Never Used  . Alcohol Use: No  . Drug  Use: 14.00 per week    Special: Marijuana     Comment: hx of cocaine use (stopped 2009)  . Sexual Activity: Not Currently     Comment: pt. declined condoms   Other Topics Concern  . Not on file   Social History Narrative    Family History  Problem Relation Age of Onset  . Diabetes type II Brother     Past Medical History  Diagnosis Date  . HIV infection   . Shingles     2000  . Anemia     Past Surgical History  Procedure Laterality Date  . Cesarean section    . Abdominal hysterectomy  1993    Partial  . Tubal ligation    . Tonsillectomy  1988    Current Outpatient Prescriptions  Medication Sig Dispense Refill  . elvitegravir-cobicistat-emtricitabine-tenofovir (STRIBILD) 150-150-200-300 MG TABS tablet Take 1 tablet by mouth daily with breakfast. 30 tablet 11  . omeprazole (PRILOSEC) 20 MG capsule TAKE ONE CAPSULE BY MOUTH TWICE A DAY 60 capsule 3  . promethazine (PHENERGAN) 25 MG tablet Take 0.5 tablets (12.5 mg total) by mouth every 8 (eight) hours as needed for nausea. 60 tablet 11  . valACYclovir (VALTREX) 1000 MG tablet Take 1 tablet (1,000 mg total) by mouth daily. 30 tablet 11  . [DISCONTINUED] pantoprazole sodium (PROTONIX) 40 mg/20 mL PACK Place 20 mLs (40 mg total) into feeding tube daily at 12 noon. 30 each 11   No current facility-administered medications for this visit.  Allergies as of 09/17/2014 - Review Complete 08/30/2014  Allergen Reaction Noted  . Morphine Other (See Comments) 01/30/2007  . Doxycycline hyclate Rash 09/18/2009    Vitals: BP 155/94 mmHg  Pulse 81  Ht 5\' 5"  (1.651 m)  Wt 216 lb 6.4 oz (98.158 kg)  BMI 36.01 kg/m2 Last Weight:  Wt Readings from Last 1 Encounters:  09/17/14 216 lb 6.4 oz (98.158 kg)   Last Height:   Ht Readings from Last 1 Encounters:  09/17/14 5\' 5"  (1.651 m)    Physical exam: Exam: Gen: NAD, conversant, well nourised, obese, well groomed                     CV: RRR, no MRG. No Carotid Bruits. No  peripheral edema, warm, nontender Eyes: Conjunctivae clear without exudates or hemorrhage  Neuro: Detailed Neurologic Exam  Speech:    Speech is normal; fluent and spontaneous with normal comprehension.  Cognition:    The patient is oriented to person, place, and time;     recent and remote memory intact;     language fluent;     normal attention, concentration,     fund of knowledge Cranial Nerves:    The pupils are equal, round, and reactive to light. The fundi are flat. Visual fields are full to finger confrontation. Extraocular movements are intact. Trigeminal sensation is intact and the muscles of mastication are normal. The face is symmetric. The palate elevates in the midline. Voice is normal. Shoulder shrug is normal. The tongue has normal motion without fasciculations.   Coordination:    Normal finger to nose and heel to shin.  Gait:    Heel-toe gait are normal.   Motor Observation:    No asymmetry, no atrophy, and no involuntary movements noted. Tone:    Normal muscle tone.    Posture:    Posture is normal. normal erect    Strength:    Strength is V/V in the upper and lower limbs.      Sensation: intact     Reflex Exam:  DTR's:    Deep tendon reflexes in the upper and lower extremities are normal bilaterally.   Toes:    The toes are downgoing bilaterally.   Clonus:    Clonus is absent.   Assessment/Plan:  48 year old female with a PMHx of HIV, HTN, Diabetes presenting with vision changes, right face pain and paresthesias as well as pain in the occipital areas that is atypical for TN and/or occipital neuralgia, right hand paresthesias and weakness. Will order an MRI of the brain w/wo contrast to evaluate for possible ischemic or hemorrhagic lesions of the brainstem that can cause isolated cranial nerve palsies; small lesions of the brainstem can affect the trigeminal nerve only. Other causes include including traumatic, vascular,inflammatory, demyelinating,  infectious, and neoplastic. Will check renal function for the contrast. Will start a Neurontin for pain.  Has associated occipital pain and neck pain, both worse with cough so will need to also check MRI of the cervical spine to eval for compressive lesions. Given her HIV status will also look for infectious causes.   She also reports excessive fatigue, Snores, excessive daytime drowsiness, frequent awakenings, morbid obesity, chokes at night. She has vascular risk factors such as diabetes. OSA can increase risk of stroke, needs a sleep study.     Naomie DeanAntonia Phat Dalton, MD  [facility] [address] [place], [place] [id number]  Phone [phone] Fax [phone] Lesly DukesWILLIS,CHARLES KEITH

## 2014-09-17 NOTE — Patient Instructions (Signed)
Overall you are doing fairly well but I do want to suggest a few things today:   Remember to drink plenty of fluid, eat healthy meals and do not skip any meals. Try to eat protein with a every meal and eat a healthy snack such as fruit or nuts in between meals. Try to keep a regular sleep-wake schedule and try to exercise daily, particularly in the form of walking, 20-30 minutes a day, if you can.   As far as your medications are concerned, I would like to suggest: Neurontin 300mg  as needed for pain  As far as diagnostic testing: MRi of the brain, MRi of neck, slepe study  I would like to see you back in 3 months, sooner if we need to. Please call us with any interim questions, concerns, problems, updates or refill requests.   Please also call us for any test results so we can go over those with you on the phone.  My clinical assistant and will answer any of your questions and relay your messages to me and also relay most of my messages to you.   Our phone number is [phone]. We also have an after hours call service for urgent matters and there is a physician on-call for urgent questions. For any emergencies you know to call 911 or go to the nearest emergency room

## 2014-09-20 ENCOUNTER — Telehealth: Payer: Self-pay | Admitting: Neurology

## 2014-09-20 DIAGNOSIS — G4710 Hypersomnia, unspecified: Secondary | ICD-10-CM

## 2014-09-20 DIAGNOSIS — R0683 Snoring: Secondary | ICD-10-CM

## 2014-09-20 DIAGNOSIS — G4730 Sleep apnea, unspecified: Secondary | ICD-10-CM

## 2014-09-20 DIAGNOSIS — E669 Obesity, unspecified: Secondary | ICD-10-CM

## 2014-09-20 NOTE — Telephone Encounter (Signed)
Anson FretAntonia B Ahern, MD, refers patient for attended sleep study.  Height: 5'5"  Weight: 216lb 6.4oz  BMI: 36.01  Past Medical History:  HIV infection    . Shingles     2000  . Anemia      Sleep Symptoms: Snores, excessive daytime drowsiness, frequent awakenings, morbid obesity, chokes at night.    Epworth Score: Called and got voicemail   Medication:  Elviteg-Cobicis-Emtricit-Tenof (Tab) STRIBILD 150-150-200-300 MG Take 1 tablet by mouth daily with breakfast.       Gabapentin (Cap) NEURONTIN 300 MG Take 1 capsule (300 mg total) by mouth 3 (three) times daily.      Omeprazole (Capsule Delayed Release) PRILOSEC 20 MG TAKE ONE CAPSULE BY MOUTH TWICE A DAY      Promethazine HCl (Tab) PHENERGAN 25 MG Take 0.5 tablets (12.5 mg total) by mouth every 8 (eight) hours as needed for nausea.      ValACYclovir HCl (Tab) VALTREX 1000 MG Take 1 tablet (1,000 mg total) by mouth daily      Ins: [employer]/Medicaid   Assessment & Plan: 48 year old female with a PMHx of HIV, HTN, Diabetes presenting with vision changes, right face pain and paresthesias as well as pain in the occipital areas that is atypical for TN and/or occipital neuralgia, right hand paresthesias and weakness. Will order an MRI of the brain w/wo contrast to evaluate for possible ischemic or hemorrhagic lesions of the brainstem that can cause isolated cranial nerve palsies; small lesions of the brainstem can affect the trigeminal nerve only. Other causes include including traumatic, vascular,inflammatory, demyelinating, infectious, and neoplastic. Will check renal function for the contrast. Will start a Neurontin for pain. Has associated occipital pain and neck pain, both worse with cough so will need to also check MRI of the cervical spine to eval for compressive lesions. Given her HIV status will also look for infectious causes.  She also reports excessive fatigue, Snores, excessive daytime drowsiness,  frequent awakenings, morbid obesity, chokes at night. She has vascular risk factors such as diabetes. OSA can increase risk of stroke, needs a sleep study.    Please review patient information and submit instructions for scheduling and orders for sleep technologist. Thank you.

## 2014-09-23 NOTE — Telephone Encounter (Signed)
Severe obsity, snoring, choking, EDS. Anemia.

## 2014-10-02 ENCOUNTER — Telehealth: Payer: Self-pay

## 2014-10-02 NOTE — Telephone Encounter (Signed)
She can see physical therapist

## 2014-10-02 NOTE — Telephone Encounter (Signed)
Patient states flexeril is not covered by insurance.  She is requesting a new medication for foot and leg cramps.   Please advise.   Laurell Josephsammy K King, RN

## 2014-10-03 ENCOUNTER — Ambulatory Visit
Admission: RE | Admit: 2014-10-03 | Discharge: 2014-10-03 | Disposition: A | Discharge status: Home / Self Care | Payer: PRIVATE HEALTH INSURANCE | Source: Ambulatory Visit | Attending: Neurology | Admitting: Neurology

## 2014-10-03 DIAGNOSIS — G500 Trigeminal neuralgia: Secondary | ICD-10-CM

## 2014-10-03 DIAGNOSIS — M5481 Occipital neuralgia: Secondary | ICD-10-CM

## 2014-10-03 DIAGNOSIS — B20 Human immunodeficiency virus [HIV] disease: Secondary | ICD-10-CM

## 2014-10-03 DIAGNOSIS — R531 Weakness: Secondary | ICD-10-CM

## 2014-10-13 ENCOUNTER — Ambulatory Visit
Admission: RE | Admit: 2014-10-13 | Discharge: 2014-10-13 | Disposition: A | Discharge status: Home / Self Care | Payer: PRIVATE HEALTH INSURANCE | Source: Ambulatory Visit | Attending: Neurology | Admitting: Neurology

## 2014-10-13 DIAGNOSIS — G500 Trigeminal neuralgia: Secondary | ICD-10-CM

## 2014-10-13 DIAGNOSIS — M5481 Occipital neuralgia: Secondary | ICD-10-CM

## 2014-10-21 ENCOUNTER — Telehealth: Payer: Self-pay | Admitting: Neurology

## 2014-10-21 NOTE — Telephone Encounter (Signed)
Spoke to patient. MRI of the cervical cord showed some mild degenerative changes. MRi of the brain with some non-specific white matter changes and cerebellar ectopia, nothing to explain symptoms. Patient is scheduled for Sleep study next month.

## 2014-11-06 ENCOUNTER — Other Ambulatory Visit: Payer: Self-pay | Admitting: *Deleted

## 2014-11-06 DIAGNOSIS — K219 Gastro-esophageal reflux disease without esophagitis: Secondary | ICD-10-CM

## 2014-11-06 MED ORDER — OMEPRAZOLE 20 MG PO CPDR: 20.0000 mg | DELAYED_RELEASE_CAPSULE | Freq: Two times a day (BID) | ORAL | Status: DC

## 2014-11-20 ENCOUNTER — Encounter (HOSPITAL_BASED_OUTPATIENT_CLINIC_OR_DEPARTMENT_OTHER): Payer: Self-pay | Admitting: Emergency Medicine

## 2014-11-20 ENCOUNTER — Emergency Department (HOSPITAL_BASED_OUTPATIENT_CLINIC_OR_DEPARTMENT_OTHER)
Admission: EM | Admit: 2014-11-20 | Discharge: 2014-11-20 | Disposition: A | Discharge status: Home / Self Care | Payer: Medicaid Other | Attending: Emergency Medicine | Admitting: Emergency Medicine

## 2014-11-20 ENCOUNTER — Telehealth: Payer: Self-pay | Admitting: *Deleted

## 2014-11-20 DIAGNOSIS — Z8619 Personal history of other infectious and parasitic diseases: Secondary | ICD-10-CM | POA: Insufficient documentation

## 2014-11-20 DIAGNOSIS — J3489 Other specified disorders of nose and nasal sinuses: Secondary | ICD-10-CM | POA: Diagnosis not present

## 2014-11-20 DIAGNOSIS — Z79899 Other long term (current) drug therapy: Secondary | ICD-10-CM | POA: Insufficient documentation

## 2014-11-20 DIAGNOSIS — Z87891 Personal history of nicotine dependence: Secondary | ICD-10-CM | POA: Diagnosis not present

## 2014-11-20 DIAGNOSIS — K429 Umbilical hernia without obstruction or gangrene: Secondary | ICD-10-CM

## 2014-11-20 DIAGNOSIS — Z21 Asymptomatic human immunodeficiency virus [HIV] infection status: Secondary | ICD-10-CM | POA: Insufficient documentation

## 2014-11-20 DIAGNOSIS — Z862 Personal history of diseases of the blood and blood-forming organs and certain disorders involving the immune mechanism: Secondary | ICD-10-CM | POA: Insufficient documentation

## 2014-11-20 DIAGNOSIS — R109 Unspecified abdominal pain: Secondary | ICD-10-CM | POA: Diagnosis present

## 2014-11-20 NOTE — Telephone Encounter (Signed)
Pt shared that a "knot" appeared 11/19/14 at her umbilicus the size of a fifty cent piece.  Pt is anxious.  Pt also has "cold" symptoms.  Taking Alka Seltzer "Cold" tablets for the "cold" symptoms.  Pt not sleeping well at night.  Pt advised to go to an urgent care center for evaluation of the "knot" due to it's size and her anxiety.  Pt stated that she would go to the office on [address].

## 2014-11-20 NOTE — Telephone Encounter (Signed)
Ok very good.

## 2014-11-20 NOTE — Discharge Instructions (Signed)

## 2014-11-20 NOTE — ED Notes (Signed)
MD at bedside. 

## 2014-11-20 NOTE — ED Notes (Signed)
49 yo with c/o feeling a "knot" in the stomach area after sneezing and blowing nose forecully. Pt also says she has "head cold" symptoms. Denies Fever N/V/D.

## 2014-11-20 NOTE — ED Provider Notes (Signed)
CSN: [id number]     Arrival date & time 11/20/14  1012 History   First MD Initiated Contact with Patient 11/20/14 1043     Chief Complaint  Patient presents with  . Abdominal Pain     (Consider location/radiation/quality/duration/timing/severity/associated sxs/prior Treatment) HPI Comments: Patient presents with a knot around her bellybutton. She's been having some sneezing and runny nose for the last few days. During a sneezing episode she felt did not in her bellybutton. This happened about 2 days ago and it's been sore intermittently. She denies any nausea or vomiting. She denies any fevers or chills. She denies any cough or chest congestion. She denies any enlarging of the knot. She denies any past known hernias.  Patient is a 49 y.o. female presenting with abdominal pain.  Abdominal Pain Associated symptoms: no chest pain, no chills, no cough, no diarrhea, no fatigue, no fever, no hematuria, no nausea, no shortness of breath and no vomiting     Past Medical History  Diagnosis Date  . HIV infection   . Shingles     2000  . Anemia    Past Surgical History  Procedure Laterality Date  . Cesarean section    . Abdominal hysterectomy  1993    Partial  . Tubal ligation    . Tonsillectomy  1988   Family History  Problem Relation Age of Onset  . Diabetes type II Brother   . Migraines Neg Hx    History  Substance Use Topics  . Smoking status: Former Smoker    Types: Cigarettes  . Smokeless tobacco: Never Used  . Alcohol Use: No   OB History    No data available     Review of Systems  Constitutional: Negative for fever, chills, diaphoresis and fatigue.  HENT: Positive for congestion, rhinorrhea and sneezing.   Eyes: Negative.   Respiratory: Negative for cough, chest tightness and shortness of breath.   Cardiovascular: Negative for chest pain and leg swelling.  Gastrointestinal: Positive for abdominal pain. Negative for nausea, vomiting, diarrhea and blood in stool.   Genitourinary: Negative for frequency, hematuria, flank pain and difficulty urinating.  Musculoskeletal: Negative for back pain and arthralgias.  Skin: Negative for rash.  Neurological: Negative for dizziness, speech difficulty, weakness, numbness and headaches.      Allergies  Morphine and Doxycycline hyclate  Home Medications   Prior to Admission medications   Medication Sig Start Date End Date Taking? Authorizing Provider  elvitegravir-cobicistat-emtricitabine-tenofovir (STRIBILD) 150-150-200-300 MG TABS tablet Take 1 tablet by mouth daily with breakfast. 06/03/14  Yes Randall Hissornelius N Van Dam, MD  gabapentin (NEURONTIN) 300 MG capsule Take 1 capsule (300 mg total) by mouth 3 (three) times daily. 09/17/14  Yes Anson FretAntonia B Ahern, MD  omeprazole (PRILOSEC) 20 MG capsule Take 1 capsule (20 mg total) by mouth 2 (two) times daily. 11/06/14  Yes Randall Hissornelius N Van Dam, MD  valACYclovir (VALTREX) 1000 MG tablet Take 1 tablet (1,000 mg total) by mouth daily. 04/19/14 04/19/15 Yes Randall Hissornelius N Van Dam, MD  promethazine (PHENERGAN) 25 MG tablet Take 0.5 tablets (12.5 mg total) by mouth every 8 (eight) hours as needed for nausea. 06/03/14   Randall Hissornelius N Van Dam, MD   BP 167/75 mmHg  Pulse 78  Temp(Src) 98.4 F (36.9 C) (Oral)  Resp 16  Ht 5\' 5"  (1.651 m)  Wt 214 lb (97.07 kg)  BMI 35.61 kg/m2  SpO2 100% Physical Exam  Constitutional: She is oriented to person, place, and time. She appears well-developed and  well-nourished.  HENT:  Head: Normocephalic and atraumatic.  Eyes: Pupils are equal, round, and reactive to light.  Neck: Normal range of motion. Neck supple.  Cardiovascular: Normal rate, regular rhythm and normal heart sounds.   Pulmonary/Chest: Effort normal and breath sounds normal. No respiratory distress. She has no wheezes. She has no rales. She exhibits no tenderness.  Abdominal: Soft. Bowel sounds are normal. There is tenderness. There is no rebound and no guarding.  Patient has a small  umbilical hernia which is tender to palpation  Musculoskeletal: Normal range of motion. She exhibits no edema.  Lymphadenopathy:    She has no cervical adenopathy.  Neurological: She is alert and oriented to person, place, and time.  Skin: Skin is warm and dry. No rash noted.  Psychiatric: She has a normal mood and affect.    ED Course  Procedures (including critical care time) Labs Review Labs Reviewed - No data to display  Imaging Review No results found.   EKG Interpretation None      MDM   Final diagnoses:  Umbilical hernia without obstruction and without gangrene    On palpation of the umbilical hernia, I was able to reduce it and patient feels completely better. She has no abdominal tenderness on exam. I advised her that if the hernia recurs, she should lay down and try to work it back in. If she is unable to reduce the hernia at home or she has worsening pain vomiting or fevers she needs to return to the ED for reevaluation. I did give her referral to follow-up with surgery.    Rolan Bucco, MD 11/20/14 [phone]

## 2014-12-03 ENCOUNTER — Other Ambulatory Visit: Payer: Medicare Other

## 2014-12-03 ENCOUNTER — Other Ambulatory Visit (INDEPENDENT_AMBULATORY_CARE_PROVIDER_SITE_OTHER): Payer: Self-pay | Admitting: Surgery

## 2014-12-03 DIAGNOSIS — Z113 Encounter for screening for infections with a predominantly sexual mode of transmission: Secondary | ICD-10-CM

## 2014-12-03 DIAGNOSIS — Z79899 Other long term (current) drug therapy: Secondary | ICD-10-CM

## 2014-12-03 DIAGNOSIS — B20 Human immunodeficiency virus [HIV] disease: Secondary | ICD-10-CM

## 2014-12-03 LAB — COMPLETE METABOLIC PANEL WITH GFR
ALT: 32 U/L (ref 0–35)
AST: 18 U/L (ref 0–37)
Albumin: 4.1 g/dL (ref 3.5–5.2)
Alkaline Phosphatase: 114 U/L (ref 39–117)
BILIRUBIN TOTAL: 0.3 mg/dL (ref 0.2–1.2)
BUN: 14 mg/dL (ref 6–23)
CHLORIDE: 104 meq/L (ref 96–112)
CO2: 30 mEq/L (ref 19–32)
Calcium: 9.8 mg/dL (ref 8.4–10.5)
Creat: 0.89 mg/dL (ref 0.50–1.10)
GFR, EST NON AFRICAN AMERICAN: 76 mL/min
GFR, Est African American: 88 mL/min
Glucose, Bld: 85 mg/dL (ref 70–99)
Potassium: 4.9 mEq/L (ref 3.5–5.3)
SODIUM: 144 meq/L (ref 135–145)
Total Protein: 7.3 g/dL (ref 6.0–8.3)

## 2014-12-03 LAB — CBC WITH DIFFERENTIAL/PLATELET
BASOS PCT: 1 % (ref 0–1)
Basophils Absolute: 0.1 10*3/uL (ref 0.0–0.1)
EOS PCT: 5 % (ref 0–5)
Eosinophils Absolute: 0.5 10*3/uL (ref 0.0–0.7)
HEMATOCRIT: 41.2 % (ref 36.0–46.0)
HEMOGLOBIN: 13.3 g/dL (ref 12.0–15.0)
Lymphocytes Relative: 43 % (ref 12–46)
Lymphs Abs: 4 10*3/uL (ref 0.7–4.0)
MCH: 27 pg (ref 26.0–34.0)
MCHC: 32.3 g/dL (ref 30.0–36.0)
MCV: 83.7 fL (ref 78.0–100.0)
MONOS PCT: 7 % (ref 3–12)
MPV: 9.6 fL (ref 8.6–12.4)
Monocytes Absolute: 0.7 10*3/uL (ref 0.1–1.0)
Neutro Abs: 4.1 10*3/uL (ref 1.7–7.7)
Neutrophils Relative %: 44 % (ref 43–77)
PLATELETS: 425 10*3/uL — AB (ref 150–400)
RBC: 4.92 MIL/uL (ref 3.87–5.11)
RDW: 15.5 % (ref 11.5–15.5)
WBC: 9.4 10*3/uL (ref 4.0–10.5)

## 2014-12-03 LAB — LIPID PANEL
CHOL/HDL RATIO: 4.8 ratio
CHOLESTEROL: 223 mg/dL — AB (ref 0–200)
HDL: 46 mg/dL (ref >39)
LDL Cholesterol: 139 mg/dL — ABNORMAL HIGH (ref 0–99)
TRIGLYCERIDES: 191 mg/dL — AB (ref <150)
VLDL: 38 mg/dL (ref 0–40)

## 2014-12-03 NOTE — H&P (Signed)
Kelly ApplebaumSharon M. Shields 12/03/2014 1:42 PM Location: [facility] Patient #: [id number] DOB: June 30, 1966 Single / Language: Lenox Ponds[place] / Race: Black or African American Female History of Present Illness [name](Kairee Isa C. Duwane Gewirtz MD; 12/03/2014 2:30 PM) Patient words: umb hernia.  The patient is a 49 year old female who presents with an umbilical hernia. Patient sent for surgical consultation by emergency physician, Rolan BuccoMelanie Belfi, MD, for concern of umbilical hernia Pleasant obese female. HIV positive. Follow closely with infectious disease, Dr Daiva EvesVan Dam. Low viral counts. No history of recent infections. She felt episode of sharp pain just above her belly button. With severe. Went to the emergency room. Concern for hernia. Eventually able to be reduced. She still feels some intermittent pain. He has to work to get it reduced. Normally has about every day. Can walk about a half hour without much difficulty. Recalls having a diagnostic laparoscopy done through her navel a few decades ago for fertility evaluation. No fevers chills or sweats. Other Problems Gilmer Mor(Sonya Bynum, CMA; 12/03/2014 1:43 PM) Gastroesophageal Reflux Disease HIV-positive Umbilical Hernia Repair  Past Surgical History Gilmer Mor(Sonya Bynum, CMA; 12/03/2014 1:43 PM) Hysterectomy (not due to cancer) - Partial Tonsillectomy  Diagnostic Studies History Gilmer Mor(Sonya Bynum, CMA; 12/03/2014 1:43 PM) Colonoscopy never Mammogram 1-3 years ago Pap Smear 1-5 years ago  Allergies Gilmer Mor(Sonya Bynum, CMA; 12/03/2014 1:44 PM) Morphine Sulfate (Concentrate) *ANALGESICS - OPIOID*  Medication History (Sonya Bynum, CMA; 12/03/2014 1:44 PM) Metoprolol Succinate ER (50MG  Tablet ER 24HR, Oral) Active. Gabapentin (300MG  Capsule, Oral) Active. Stribild (150-150-200-300MG  Tablet, Oral) Active. Omeprazole (20MG  Capsule DR, Oral) Active.  Social History Gilmer Mor(Sonya Bynum, CMA; 12/03/2014 1:43 PM) Alcohol use Occasional alcohol use. Caffeine use Tea. Tobacco  use Former smoker.  Family History Gilmer Mor(Sonya Bynum, CMA; 12/03/2014 1:43 PM) Hypertension Brother, Father, Mother, Sister.  Pregnancy / Birth History Gilmer Mor(Sonya Bynum, CMA; 12/03/2014 1:43 PM) Age at menarche 11 years. Gravida 2 Maternal age <15 Para 1     Review of Systems Lamar Laundry(Sonya Bynum CMA; 12/03/2014 1:43 PM) General Present- Night Sweats and Weight Gain. Not Present- Appetite Loss, Chills, Fatigue, Fever and Weight Loss. Cardiovascular Present- Leg Cramps. Not Present- Chest Pain, Difficulty Breathing Lying Down, Palpitations, Rapid Heart Rate, Shortness of Breath and Swelling of Extremities. Gastrointestinal Present- Abdominal Pain and Excessive gas. Not Present- Bloating, Bloody Stool, Change in Bowel Habits, Chronic diarrhea, Constipation, Difficulty Swallowing, Gets full quickly at meals, Hemorrhoids, Indigestion, Nausea, Rectal Pain and Vomiting. Female Genitourinary Present- Frequency and Nocturia. Not Present- Painful Urination, Pelvic Pain and Urgency. Musculoskeletal Present- Muscle Weakness. Not Present- Back Pain, Joint Pain, Joint Stiffness, Muscle Pain and Swelling of Extremities. Neurological Present- Numbness and Tingling. Not Present- Decreased Memory, Fainting, Headaches, Seizures, Tremor, Trouble walking and Weakness. Endocrine Present- Hot flashes. Not Present- Cold Intolerance, Excessive Hunger, Hair Changes, Heat Intolerance and New Diabetes. Hematology Present- HIV. Not Present- Easy Bruising, Excessive bleeding, Gland problems and Persistent Infections.  Vitals (Sonya Bynum CMA; 12/03/2014 1:43 PM) 12/03/2014 1:43 PM Weight: 222 lb Height: 66in Body Surface Area: 2.17 m Body Mass Index: 35.83 kg/m Temp.: 70F(Temporal)  Pulse: 79 (Regular)  BP: 140/82 (Sitting, Left Arm, Standard)     Physical Exam [name](Christianne Zacher C. Cheryn Lundquist MD; 12/03/2014 2:28 PM)  General Mental Status-Alert. General Appearance-Not in acute distress, Not Sickly. Orientation-Oriented  X3. Hydration-Well hydrated. Voice-Normal.  Integumentary Global Assessment Upon inspection and palpation of skin surfaces of the - Axillae: non-tender, no inflammation or ulceration, no drainage. and Distribution of scalp and body hair is normal. General Characteristics Temperature - normal  warmth is noted.  Head and Neck Head-normocephalic, atraumatic with no lesions or palpable masses. Face Global Assessment - atraumatic, no absence of expression. Neck Global Assessment - no abnormal movements, no bruit auscultated on the right, no bruit auscultated on the left, no decreased range of motion, non-tender. Trachea-midline. Thyroid Gland Characteristics - non-tender.  Eye Eyeball - Left-Extraocular movements intact, No Nystagmus. Eyeball - Right-Extraocular movements intact, No Nystagmus. Cornea - Left-No Hazy. Cornea - Right-No Hazy. Sclera/Conjunctiva - Left-No scleral icterus, No Discharge. Sclera/Conjunctiva - Right-No scleral icterus, No Discharge. Pupil - Left-Direct reaction to light normal. Pupil - Right-Direct reaction to light normal.  ENMT Ears Pinna - Left - no drainage observed, no generalized tenderness observed. Right - no drainage observed, no generalized tenderness observed. Nose and Sinuses External Inspection of the Nose - no destructive lesion observed. Inspection of the nares - Left - quiet respiration. Right - quiet respiration. Mouth and Throat Lips - Upper Lip - no fissures observed, no pallor noted. Lower Lip - no fissures observed, no pallor noted. Nasopharynx - no discharge present. Oral Cavity/Oropharynx - Tongue - no dryness observed. Oral Mucosa - no cyanosis observed. Hypopharynx - no evidence of airway distress observed.  Chest and Lung Exam Inspection Movements - Normal and Symmetrical. Accessory muscles - No use of accessory muscles in breathing. Palpation Palpation of the chest reveals -  Non-tender. Auscultation Breath sounds - Normal and Clear.  Cardiovascular Auscultation Rhythm - Regular. Murmurs & Other Heart Sounds - Auscultation of the heart reveals - No Murmurs and No Systolic Clicks.  Abdomen Inspection Inspection of the abdomen reveals - No Visible peristalsis and No Abnormal pulsations. Umbilicus - No Bleeding, No Urine drainage. Palpation/Percussion Palpation and Percussion of the abdomen reveal - Soft, Non Tender, No Rebound tenderness, No Rigidity (guarding) and No Cutaneous hyperesthesia. Note: Apple body habitus. Obese but soft. 1 cm umbilical hernia. 3 cm supraumbilical mass is not reducible. Concerning for supraumbilical incisional hernia. Incarcerated   Female Genitourinary Sexual Maturity Tanner 5 - Adult hair pattern. Note: Normal external female genitalia. No inguinal hernias No vaginal bleeding nor discharge   Peripheral Vascular Upper Extremity Inspection - Left - No Cyanotic nailbeds, Not Ischemic. Right - No Cyanotic nailbeds, Not Ischemic.  Neurologic Neurologic evaluation reveals -normal attention span and ability to concentrate, able to name objects and repeat phrases. Appropriate fund of knowledge , normal sensation and normal coordination. Mental Status Affect - not angry, not paranoid. Cranial Nerves-Normal Bilaterally. Gait-Normal.  Neuropsychiatric Mental status exam performed with findings of-able to articulate well with normal speech/language, rate, volume and coherence, thought content normal with ability to perform basic computations and apply abstract reasoning and no evidence of hallucinations, delusions, obsessions or homicidal/suicidal ideation.  Musculoskeletal Global Assessment Spine, Ribs and Pelvis - no instability, subluxation or laxity. Right Upper Extremity - no instability, subluxation or laxity.  Lymphatic Head & Neck  General Head & Neck Lymphatics: Bilateral - Description - No Localized  lymphadenopathy. Axillary  General Axillary Region: Bilateral - Description - No Localized lymphadenopathy. Femoral & Inguinal  Generalized Femoral & Inguinal Lymphatics: Left - Description - No Localized lymphadenopathy. Right - Description - No Localized lymphadenopathy.    Assessment & Plan [name](Olyn Landstrom C. Rachella Basden MD; 12/03/2014 2:32 PM)  Louann LivINCARCERATED INCISIONAL HERNIA (552.21  K43.0) Impression: I think she has 2 hernias : supraumbilical incisional hernia and an umbilical hernia. Would benefit from surgery. Given her obesity and immunosuppressed state, would benefit from mesh underlay repair. Laparoscopic repair. Hopefully outpatient & does  not need to stay long but is on chronic pain meds so may be at risk for need to stay overnight.  She is very interested in proceeding. She would prefer to do it at [facility] hospital if possible.  The anatomy & physiology of the abdominal wall was discussed. The pathophysiology of hernias was discussed. Natural history risks without surgery including progeressive enlargement, pain, incarceration & strangulation was discussed. Contributors to complications such as smoking, obesity, diabetes, prior surgery, etc were discussed.  I feel the risks of no intervention will lead to serious problems that outweigh the operative risks; therefore, I recommended surgery to reduce and repair the hernia. I explained laparoscopic techniques with possible need for an open approach. I noted the probable use of mesh to patch and/or buttress the hernia repair  Risks such as bleeding, infection, abscess, need for further treatment, heart attack, death, and other risks were discussed. I noted a good likelihood this will help address the problem. Goals of post-operative recovery were discussed as well. Possibility that this will not correct all symptoms was explained. I stressed the importance of low-impact activity, aggressive pain control, avoiding constipation, & not pushing through pain  to minimize risk of post-operative chronic pain or injury. Possibility of reherniation especially with smoking, obesity, diabetes, immunosuppression, and other health conditions was discussed. We will work to minimize complications.  An educational handout further explaining the pathology & treatment options was given as well. Questions were answered. The patient expresses understanding & wishes to proceed with surgery.  Current Plans Schedule for Surgery Pt Education - [facility] Hernia Post-Op HCI (Kenechukwu Eckstein): discussed with patient and provided information. Pt Education - [facility] Good Bowel Health (Kha Hari) Pt Education - [facility] Pain Control (Tamieka Rancourt) UMBILICAL HERNIA WITHOUT OBSTRUCTION AND WITHOUT GANGRENE (553.1  K42.9)  [name], M.D., F.A.C.S. Gastrointestinal and Minimally Invasive Surgery [facility] [address] [place], [place] [address] [phone] Main / Paging

## 2014-12-04 LAB — HIV-1 RNA QUANT-NO REFLEX-BLD
HIV 1 RNA QUANT: 173 {copies}/mL — AB (ref <20)
HIV-1 RNA Quant, Log: 2.24 {Log} — ABNORMAL HIGH (ref <1.30)

## 2014-12-04 LAB — RPR

## 2014-12-04 LAB — T-HELPER CELL (CD4) - (RCID CLINIC ONLY)
CD4 T CELL ABS: 570 /uL (ref 400–2700)
CD4 T CELL HELPER: 14 % — AB (ref 33–55)

## 2014-12-18 ENCOUNTER — Encounter: Payer: Self-pay | Admitting: Infectious Disease

## 2014-12-18 ENCOUNTER — Ambulatory Visit (INDEPENDENT_AMBULATORY_CARE_PROVIDER_SITE_OTHER): Payer: Medicare Other | Admitting: Infectious Disease

## 2014-12-18 VITALS — BP 148/97 | HR 97 | Temp 98.2°F | Wt 220.0 lb

## 2014-12-18 DIAGNOSIS — M5481 Occipital neuralgia: Secondary | ICD-10-CM

## 2014-12-18 DIAGNOSIS — K439 Ventral hernia without obstruction or gangrene: Secondary | ICD-10-CM

## 2014-12-18 DIAGNOSIS — G43109 Migraine with aura, not intractable, without status migrainosus: Secondary | ICD-10-CM

## 2014-12-18 DIAGNOSIS — K430 Incisional hernia with obstruction, without gangrene: Secondary | ICD-10-CM

## 2014-12-18 DIAGNOSIS — I10 Essential (primary) hypertension: Secondary | ICD-10-CM | POA: Insufficient documentation

## 2014-12-18 DIAGNOSIS — B20 Human immunodeficiency virus [HIV] disease: Secondary | ICD-10-CM

## 2014-12-18 HISTORY — DX: Incisional hernia with obstruction, without gangrene: K43.0

## 2014-12-18 NOTE — Progress Notes (Signed)
Subjective:    Patient ID: [name], female    DOB: 25-Oct-1966, 49 y.o.   MRN: [id number]  HPI   49year old [place] American lady with HIV and AIDS who this  once again been able to perfectly suppress her viral load to <20 on prezista, norvir and truvada, in whom I changed to [place], back to P/N/T then Intelence and truvada.  I then changed her to  of intelence with Truvada. I told her she could dissolve the intelence but she dislikes the taste in apple juice or water   Finally changed to STRIBILD.   She had some viremia this fall and then the VL down to 170s.  She did not fill her Toprol because she was afraid of the dose given that another family member had had to come into the hospital due to bp being too low.     Lab Results  Component Value Date   HIV1RNAQUANT 173* 12/03/2014   Lab Results  Component Value Date   CD4TABS 570 12/03/2014   CD4TABS 550 08/30/2014   CD4TABS 580 05/21/2014      Review of Systems  Constitutional: Negative for fever, chills, diaphoresis, activity change, appetite change, fatigue and unexpected weight change.  HENT: Negative for congestion, rhinorrhea, sinus pressure, sneezing, sore throat and trouble swallowing.   Eyes: Negative for photophobia and visual disturbance.  Respiratory: Negative for cough, chest tightness, shortness of breath, wheezing and stridor.   Cardiovascular: Negative for chest pain, palpitations and leg swelling.  Gastrointestinal: Negative for nausea, vomiting, abdominal pain, diarrhea, constipation, blood in stool, abdominal distention and anal bleeding.  Genitourinary: Negative for dysuria, hematuria, flank pain and difficulty urinating.  Musculoskeletal: Negative for myalgias, back pain, joint swelling, arthralgias and gait problem.  Skin: Negative for color change, pallor, rash and wound.  Neurological: Negative for dizziness, tremors, weakness and light-headedness.  Hematological: Negative  for adenopathy. Does not bruise/bleed easily.  Psychiatric/Behavioral: Negative for behavioral problems, confusion, sleep disturbance, dysphoric mood, decreased concentration and agitation.       Objective:   Physical Exam  Constitutional: She is oriented to person, place, and time. She appears well-developed and well-nourished. No distress.  HENT:  Head: Normocephalic and atraumatic.  Mouth/Throat: Oropharynx is clear and moist. No oropharyngeal exudate.  Eyes: Conjunctivae and EOM are normal. Pupils are equal, round, and reactive to light. No scleral icterus.  Neck: Normal range of motion. Neck supple. No JVD present.  Cardiovascular: Normal rate, regular rhythm and normal heart sounds.  Exam reveals no gallop and no friction rub.   No murmur heard. Pulmonary/Chest: Effort normal and breath sounds normal. No respiratory distress. She has no wheezes. She has no rales. She exhibits no tenderness.  Abdominal: She exhibits no distension and no mass. There is no tenderness. There is no rebound and no guarding.  Musculoskeletal: She exhibits no edema or tenderness.  Lymphadenopathy:    She has no cervical adenopathy.  Neurological: She is alert and oriented to person, place, and time. She has normal reflexes. She exhibits normal muscle tone. Coordination normal.  Skin: Skin is warm and dry. She is not diaphoretic. No erythema. No pallor.  Psychiatric: She has a normal mood and affect. Her behavior is normal. Judgment and thought content normal.          Assessment & Plan:  HIV: would like to change her to [facility] but would like to make sure that we have coverage from Medicaid. Will check with Minh  I also asked  her to be STRICTLY adherent to her meds  WIll recheck VL and genotypes today  I spent greater than 25 minutes with the patient including greater than 50% of time in face to face counsel of the patient and in coordination of their care.  I also have introduced her to Deirdre EvenerKim  Epperson and will see if we can enroll into REPRIEVE including the substudy  Headaches:  Being followed by Neurology  Hernia: going to have repair by [facility]  HTN: will revisit with her at her next visit. I think the dose of toprol should be quite fine for her

## 2014-12-19 LAB — HIV-1 RNA ULTRAQUANT REFLEX TO GENTYP+
HIV 1 RNA Quant: 862 copies/mL — ABNORMAL HIGH (ref <20)
HIV-1 RNA Quant, Log: 2.94 {Log} — ABNORMAL HIGH (ref <1.30)

## 2014-12-20 NOTE — Progress Notes (Signed)
I contacted the lab and Kelly BraunKaren will make sure its done

## 2014-12-23 ENCOUNTER — Telehealth: Payer: Self-pay | Admitting: Licensed Clinical Social Worker

## 2014-12-23 NOTE — Telephone Encounter (Signed)
      Previous Messages

## 2014-12-24 LAB — HIV-1 INTEGRASE GENOTYPE

## 2014-12-26 NOTE — Patient Instructions (Addendum)
[name]  12/26/2014   Your procedure is scheduled on: 01/02/2015    [facility]  [facility] and follow signs to               Short Stay Center at       12noon   Call this number if you have problems the morning of surgery [phone]   Remember:  Do not eat food after midnite.  May have clear liquids until 0700am then nothing by mouth.     CLEAR LIQUID DIET   Foods Allowed                                                                     Foods Excluded  Coffee and tea, regular and decaf                             liquids that you cannot  Plain Jell-O in any flavor                                             see through such as: Fruit ices (not with fruit pulp)                                     milk, soups, orange juice  Iced Popsicles                                    All solid food Carbonated beverages, regular and diet                                    Cranberry, grape and apple juices Sports drinks like Gatorade Lightly seasoned clear broth or consume(fat free) Sugar, honey syrup  Sample Menu Breakfast                                Lunch                                     Supper Cranberry juice                    Beef broth                            Chicken broth Jell-O                                     Grape juice  Apple juice Coffee or tea                        Jell-O                                      Popsicle                                                Coffee or tea                        Coffee or tea  _____________________________________________________________________       Take these medicines the morning of surgery with A SIP OF WATER: Prilosec                                You may not have any metal on your body including hair pins and              piercings  Do not wear jewelry, make-up, lotions, powders or perfumes, deodorant.  .             Do not wear nail polish.  Do  not shave  48 hours prior to surgery.     Do not bring valuables to the hospital. [facility] IS NOT             RESPONSIBLE   FOR VALUABLES.  Contacts, dentures or bridgework may not be worn into surgery.  Leave suitcase in the car. After surgery it may be brought to your room.     Marland Kitchen    Special Instructions: coughing and deep breathing exercises, leg exercises               Please read over the following fact sheets you were given: _____________________________________________________________________             [facility] - Preparing for Surgery Before surgery, you can play an important role.  Because skin is not sterile, your skin needs to be as free of germs as possible.  You can reduce the number of germs on your skin by washing with CHG (chlorahexidine gluconate) soap before surgery.  CHG is an antiseptic cleaner which kills germs and bonds with the skin to continue killing germs even after washing. Please DO NOT use if you have an allergy to CHG or antibacterial soaps.  If your skin becomes reddened/irritated stop using the CHG and inform your nurse when you arrive at Short Stay. Do not shave (including legs and underarms) for at least 48 hours prior to the first CHG shower.  You may shave your face/neck. Please follow these instructions carefully:  1.  Shower with CHG Soap the night before surgery and the  morning of Surgery.  2.  If you choose to wash your hair, wash your hair first as usual with your  normal  shampoo.  3.  After you shampoo, rinse your hair and body thoroughly to remove the  shampoo.                           4.  Use CHG as you would any  other liquid soap.  You can apply chg directly  to the skin and wash                       Gently with a scrungie or clean washcloth.  5.  Apply the CHG Soap to your body ONLY FROM THE NECK DOWN.   Do not use on face/ open                           Wound or open sores. Avoid contact with eyes, ears mouth and genitals (private  parts).                       Wash face,  Genitals (private parts) with your normal soap.             6.  Wash thoroughly, paying special attention to the area where your surgery  will be performed.  7.  Thoroughly rinse your body with warm water from the neck down.  8.  DO NOT shower/wash with your normal soap after using and rinsing off  the CHG Soap.                9.  Pat yourself dry with a clean towel.            10.  Wear clean pajamas.            11.  Place clean sheets on your bed the night of your first shower and do not  sleep with pets. Day of Surgery : Do not apply any lotions/deodorants the morning of surgery.  Please wear clean clothes to the hospital/surgery center.  FAILURE TO FOLLOW THESE INSTRUCTIONS MAY RESULT IN THE CANCELLATION OF YOUR SURGERY PATIENT SIGNATURE_________________________________  NURSE SIGNATURE__________________________________  ________________________________________________________________________

## 2014-12-27 ENCOUNTER — Other Ambulatory Visit: Payer: Self-pay | Admitting: Infectious Disease

## 2014-12-27 ENCOUNTER — Telehealth: Payer: Self-pay | Admitting: Licensed Clinical Social Worker

## 2014-12-27 MED ORDER — EMTRICITABINE-TENOFOVIR DF 200-300 MG PO TABS
1.0000 | ORAL_TABLET | Freq: Every day | ORAL | Status: DC
Start: 2014-12-27 — End: 2014-12-30

## 2014-12-27 MED ORDER — DARUNAVIR-COBICISTAT 800-150 MG PO TABS: 1.0000 | ORAL_TABLET | Freq: Every day | ORAL | Status: DC

## 2014-12-27 NOTE — Telephone Encounter (Signed)
Called patient to give her results and to let her know that her medication would be changing. Patient did not answer and no option for message.

## 2014-12-27 NOTE — Telephone Encounter (Signed)
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   She NEEDS TO STOP STRIBILD and we will need to go back to PREZCOBIX and Truvada.  It will be dangerous for her to continue taking STRIBILD and run risk of acquiring MORE mutations to her back bone  She also should come in for visit to emphasize

## 2014-12-28 NOTE — Telephone Encounter (Signed)
We may have to have bridge counselor go out to her if she does not answer next week thx [name]!

## 2014-12-30 ENCOUNTER — Encounter (INDEPENDENT_AMBULATORY_CARE_PROVIDER_SITE_OTHER): Payer: Self-pay

## 2014-12-30 ENCOUNTER — Inpatient Hospital Stay (HOSPITAL_COMMUNITY): Admission: RE | Admit: 2014-12-30 | Payer: Medicare Other | Source: Ambulatory Visit

## 2014-12-30 ENCOUNTER — Encounter (HOSPITAL_COMMUNITY): Payer: Self-pay

## 2014-12-30 ENCOUNTER — Encounter (HOSPITAL_COMMUNITY)
Admission: RE | Admit: 2014-12-30 | Discharge: 2014-12-30 | Disposition: A | Discharge status: Home / Self Care | Payer: Medicare Other | Source: Ambulatory Visit | Attending: Surgery | Admitting: Surgery

## 2014-12-30 DIAGNOSIS — D649 Anemia, unspecified: Secondary | ICD-10-CM | POA: Diagnosis not present

## 2014-12-30 DIAGNOSIS — K219 Gastro-esophageal reflux disease without esophagitis: Secondary | ICD-10-CM | POA: Diagnosis not present

## 2014-12-30 DIAGNOSIS — K660 Peritoneal adhesions (postprocedural) (postinfection): Secondary | ICD-10-CM | POA: Diagnosis not present

## 2014-12-30 DIAGNOSIS — Z6837 Body mass index (BMI) 37.0-37.9, adult: Secondary | ICD-10-CM | POA: Diagnosis not present

## 2014-12-30 DIAGNOSIS — K429 Umbilical hernia without obstruction or gangrene: Secondary | ICD-10-CM | POA: Diagnosis not present

## 2014-12-30 DIAGNOSIS — G8929 Other chronic pain: Secondary | ICD-10-CM | POA: Diagnosis not present

## 2014-12-30 DIAGNOSIS — Z87891 Personal history of nicotine dependence: Secondary | ICD-10-CM | POA: Diagnosis not present

## 2014-12-30 DIAGNOSIS — K430 Incisional hernia with obstruction, without gangrene: Secondary | ICD-10-CM | POA: Diagnosis not present

## 2014-12-30 DIAGNOSIS — Z21 Asymptomatic human immunodeficiency virus [HIV] infection status: Secondary | ICD-10-CM | POA: Diagnosis not present

## 2014-12-30 DIAGNOSIS — I10 Essential (primary) hypertension: Secondary | ICD-10-CM | POA: Diagnosis not present

## 2014-12-30 DIAGNOSIS — I252 Old myocardial infarction: Secondary | ICD-10-CM | POA: Diagnosis not present

## 2014-12-30 DIAGNOSIS — R51 Headache: Secondary | ICD-10-CM | POA: Diagnosis not present

## 2014-12-30 HISTORY — DX: Headache, unspecified: R51.9

## 2014-12-30 HISTORY — DX: Headache: R51

## 2014-12-30 HISTORY — DX: Essential (primary) hypertension: I10

## 2014-12-30 HISTORY — DX: Gastro-esophageal reflux disease without esophagitis: K21.9

## 2014-12-30 LAB — CBC
HCT: 40 % (ref 36.0–46.0)
HEMOGLOBIN: 12.6 g/dL (ref 12.0–15.0)
MCH: 27 pg (ref 26.0–34.0)
MCHC: 31.5 g/dL (ref 30.0–36.0)
MCV: 85.8 fL (ref 78.0–100.0)
PLATELETS: 414 10*3/uL — AB (ref 150–400)
RBC: 4.66 MIL/uL (ref 3.87–5.11)
RDW: 14.5 % (ref 11.5–15.5)
WBC: 8.1 10*3/uL (ref 4.0–10.5)

## 2014-12-30 LAB — BASIC METABOLIC PANEL
ANION GAP: 8 (ref 5–15)
BUN: 12 mg/dL (ref 6–23)
CHLORIDE: 105 mmol/L (ref 96–112)
CO2: 28 mmol/L (ref 19–32)
Calcium: 9.3 mg/dL (ref 8.4–10.5)
Creatinine, Ser: 0.92 mg/dL (ref 0.50–1.10)
GFR, EST AFRICAN AMERICAN: 83 mL/min — AB (ref >90)
GFR, EST NON AFRICAN AMERICAN: 72 mL/min — AB (ref >90)
Glucose, Bld: 89 mg/dL (ref 70–99)
POTASSIUM: 4.2 mmol/L (ref 3.5–5.1)
SODIUM: 141 mmol/L (ref 135–145)

## 2014-12-30 LAB — SURGICAL PCR SCREEN
MRSA, PCR: NEGATIVE
Staphylococcus aureus: NEGATIVE

## 2014-12-30 NOTE — Progress Notes (Signed)
Final EKG done 12/30/14 in [facility].

## 2014-12-30 NOTE — Progress Notes (Signed)
LOV with Infectious Disease in [facility]- 12/18/2014   [facility]

## 2014-12-31 ENCOUNTER — Encounter: Payer: Self-pay | Admitting: Pharmacist Clinician (PhC)/ Clinical Pharmacy Specialist

## 2014-12-31 ENCOUNTER — Other Ambulatory Visit: Payer: Self-pay | Admitting: Licensed Clinical Social Worker

## 2014-12-31 MED ORDER — EMTRICITABINE-TENOFOVIR DF 200-300 MG PO TABS: 1.0000 | ORAL_TABLET | Freq: Every day | ORAL | Status: DC

## 2014-12-31 MED ORDER — DARUNAVIR-COBICISTAT 800-150 MG PO TABS: 1.0000 | ORAL_TABLET | Freq: Every day | ORAL | Status: DC

## 2014-12-31 NOTE — Progress Notes (Signed)
Patient ID: [name], female   DOB: [birth year], 49 y.o.   MRN: [id number] HPI: [name] is a 49 y.o. female has been on stribild for her HIV. Dr Daiva EvesVan Dam asked me to call her since she has developed resistance to elvitegravir.   Allergies: Allergies  Allergen Reactions  . Morphine Other (See Comments)    Burning under the skin  . Doxycycline Hyclate Rash    Vitals:    Past Medical History: Past Medical History  Diagnosis Date  . HIV infection   . Shingles     2000  . Anemia   . Hypertension     not on medication   . GERD (gastroesophageal reflux disease)   . Headache     hx of migraines     Social History: History   Social History  . Marital Status: Single    Spouse Name: N/A  . Number of Children: N/A  . Years of Education: HS   Occupational History  . disabled    Social History Main Topics  . Smoking status: Former Smoker    Types: Cigarettes  . Smokeless tobacco: Never Used  . Alcohol Use: Yes     Comment: rare red wine   . Drug Use: 14.00 per week    Special: Marijuana     Comment: hx of cocaine use (stopped 2008)  . Sexual Activity: Not Currently     Comment: pt. declined condoms   Other Topics Concern  . Not on file   Social History Narrative    Previous Regimen:   Current Regimen: Stribild  Labs: HIV 1 RNA QUANT (copies/mL)  Date Value  12/18/2014 862*  12/03/2014 173*  08/30/2014 1324*   CD4 T CELL ABS (/uL)  Date Value  12/03/2014 570  08/30/2014 550  05/21/2014 580   HEP B S AB (no units)  Date Value  04/22/2008 POS*   HEPATITIS B SURFACE AG (no units)  Date Value  12/26/2006 NO   HCV AB (no units)  Date Value  12/26/2006 NO    CrCl: Estimated Creatinine Clearance: 85.1 mL/min (by C-G formula based on Cr of 0.92).  Lipids:    Component Value Date/Time   CHOL 223* 12/03/2014 1100   TRIG 191* 12/03/2014 1100   HDL 46 12/03/2014 1100   CHOLHDL 4.8 12/03/2014 1100   VLDL 38 12/03/2014 1100   LDLCALC 139* 12/03/2014 1100    Assessment: 49 yo who has been on multiple ARTs in the past. She was switched to Stribild due to some headache issue in the past with DRV/r + TRV. She has now developed resistance to elvitegravir and has led her to low level VL. I called her today and she said that she is having surgery this week and she doesn't know who I am and will not take advice to change her regimen. I'm going to bring her in next week to see Dr. Daiva EvesVan Dam and we can change her to Prezcobix and Truvada at that point.   Recommendations: Bring her in next week to change ART Will need to stop Stribild  Clide CliffPham, Alayia Meggison Quang, PharmD Clinical Infectious Disease Pharmacist [facility] 12/31/2014, 4:12 PM

## 2014-12-31 NOTE — Telephone Encounter (Signed)
Tried to call the patient again today and was unable to reach her.

## 2015-01-01 NOTE — Progress Notes (Signed)
I spoke with her yesterday and she stated she will stop stribild and start Prezcobix and truvada. i called it in yesterday to her pharmacy

## 2015-01-02 ENCOUNTER — Encounter (HOSPITAL_COMMUNITY): Payer: Self-pay | Admitting: *Deleted

## 2015-01-02 ENCOUNTER — Observation Stay (HOSPITAL_COMMUNITY)
Admission: RE | Admit: 2015-01-02 | Discharge: 2015-01-05 | Disposition: A | Discharge status: Home / Self Care | Payer: Medicare Other | Source: Ambulatory Visit | Attending: Surgery | Admitting: Surgery

## 2015-01-02 ENCOUNTER — Ambulatory Visit (HOSPITAL_COMMUNITY): Payer: Medicare Other | Admitting: Certified Registered Nurse Anesthetist

## 2015-01-02 ENCOUNTER — Encounter (HOSPITAL_COMMUNITY)
Admission: RE | Disposition: A | Discharge status: Home / Self Care | Payer: Self-pay | Source: Ambulatory Visit | Attending: Surgery

## 2015-01-02 DIAGNOSIS — K219 Gastro-esophageal reflux disease without esophagitis: Secondary | ICD-10-CM | POA: Insufficient documentation

## 2015-01-02 DIAGNOSIS — K430 Incisional hernia with obstruction, without gangrene: Secondary | ICD-10-CM | POA: Diagnosis not present

## 2015-01-02 DIAGNOSIS — K429 Umbilical hernia without obstruction or gangrene: Secondary | ICD-10-CM | POA: Diagnosis not present

## 2015-01-02 DIAGNOSIS — N951 Menopausal and female climacteric states: Secondary | ICD-10-CM | POA: Diagnosis present

## 2015-01-02 DIAGNOSIS — Z21 Asymptomatic human immunodeficiency virus [HIV] infection status: Secondary | ICD-10-CM | POA: Insufficient documentation

## 2015-01-02 DIAGNOSIS — I252 Old myocardial infarction: Secondary | ICD-10-CM | POA: Insufficient documentation

## 2015-01-02 DIAGNOSIS — I10 Essential (primary) hypertension: Secondary | ICD-10-CM | POA: Diagnosis not present

## 2015-01-02 DIAGNOSIS — Z6837 Body mass index (BMI) 37.0-37.9, adult: Secondary | ICD-10-CM | POA: Insufficient documentation

## 2015-01-02 DIAGNOSIS — R51 Headache: Secondary | ICD-10-CM | POA: Insufficient documentation

## 2015-01-02 DIAGNOSIS — D649 Anemia, unspecified: Secondary | ICD-10-CM | POA: Insufficient documentation

## 2015-01-02 DIAGNOSIS — G8929 Other chronic pain: Secondary | ICD-10-CM | POA: Insufficient documentation

## 2015-01-02 DIAGNOSIS — Z87891 Personal history of nicotine dependence: Secondary | ICD-10-CM | POA: Insufficient documentation

## 2015-01-02 DIAGNOSIS — B20 Human immunodeficiency virus [HIV] disease: Secondary | ICD-10-CM | POA: Diagnosis present

## 2015-01-02 DIAGNOSIS — K660 Peritoneal adhesions (postprocedural) (postinfection): Secondary | ICD-10-CM | POA: Insufficient documentation

## 2015-01-02 HISTORY — DX: Acute myocardial infarction, unspecified: I21.9

## 2015-01-02 HISTORY — DX: Incisional hernia with obstruction, without gangrene: K43.0

## 2015-01-02 HISTORY — PX: LAPAROSCOPIC ASSISTED VENTRAL HERNIA REPAIR: SHX6312

## 2015-01-02 SURGERY — REPAIR, HERNIA, VENTRAL, LAPAROSCOPY-ASSISTED
Anesthesia: General | Site: Abdomen

## 2015-01-02 MED ORDER — MIDAZOLAM HCL 5 MG/5ML IJ SOLN
INTRAMUSCULAR | Status: DC | PRN
  Administered 2015-01-02: 2 mg via INTRAVENOUS

## 2015-01-02 MED ORDER — LABETALOL HCL 5 MG/ML IV SOLN
INTRAVENOUS | Status: DC | PRN
  Administered 2015-01-02 (×3): 5 mg via INTRAVENOUS

## 2015-01-02 MED ORDER — METOPROLOL SUCCINATE ER 50 MG PO TB24
50.0000 mg | ORAL_TABLET | Freq: Every day | ORAL | Status: DC
  Filled 2015-01-02 (×2): qty 1

## 2015-01-02 MED ORDER — ONDANSETRON HCL 4 MG/2ML IJ SOLN
4.0000 mg | Freq: Once | INTRAMUSCULAR | Status: AC | PRN
  Administered 2015-01-02: 4 mg via INTRAVENOUS

## 2015-01-02 MED ORDER — PHENOL 1.4 % MT LIQD
2.0000 | OROMUCOSAL | Status: DC | PRN
  Filled 2015-01-02: qty 177

## 2015-01-02 MED ORDER — DEXTROSE 5 % IV SOLN
1000.0000 mg | Freq: Four times a day (QID) | INTRAVENOUS | Status: DC | PRN
  Filled 2015-01-02: qty 10

## 2015-01-02 MED ORDER — CHLORHEXIDINE GLUCONATE 4 % EX LIQD: 1.0000 "application " | Freq: Once | CUTANEOUS | Status: DC

## 2015-01-02 MED ORDER — LACTATED RINGERS IV BOLUS (SEPSIS)
1000.0000 mL | Freq: Three times a day (TID) | INTRAVENOUS | Status: AC | PRN
Start: 2015-01-02 — End: 2015-01-04

## 2015-01-02 MED ORDER — ACETAMINOPHEN 10 MG/ML IV SOLN
1000.0000 mg | INTRAVENOUS | Status: AC
  Administered 2015-01-02: 1000 mg via INTRAVENOUS
  Filled 2015-01-02: qty 100

## 2015-01-02 MED ORDER — PROPOFOL 10 MG/ML IV BOLUS
INTRAVENOUS | Status: DC | PRN
  Administered 2015-01-02: 200 mg via INTRAVENOUS

## 2015-01-02 MED ORDER — SODIUM CHLORIDE 0.9 % IV SOLN: 250.0000 mL | INTRAVENOUS | Status: DC | PRN

## 2015-01-02 MED ORDER — BUPIVACAINE 0.25 % ON-Q PUMP DUAL CATH 300 ML
300.0000 mL | INJECTION | Status: DC
  Filled 2015-01-02: qty 300

## 2015-01-02 MED ORDER — HEPARIN SODIUM (PORCINE) 5000 UNIT/ML IJ SOLN
5000.0000 [IU] | Freq: Three times a day (TID) | INTRAMUSCULAR | Status: DC
  Administered 2015-01-03 – 2015-01-05 (×7): 5000 [IU] via SUBCUTANEOUS
  Filled 2015-01-02 (×10): qty 1

## 2015-01-02 MED ORDER — ONDANSETRON HCL 4 MG/2ML IJ SOLN
INTRAMUSCULAR | Status: AC
  Filled 2015-01-02: qty 2

## 2015-01-02 MED ORDER — LACTATED RINGERS IR SOLN
Status: DC | PRN
  Administered 2015-01-02: 1000 mL

## 2015-01-02 MED ORDER — LIDOCAINE HCL (CARDIAC) 20 MG/ML IV SOLN
INTRAVENOUS | Status: AC
  Filled 2015-01-02: qty 5

## 2015-01-02 MED ORDER — FENTANYL CITRATE 0.05 MG/ML IJ SOLN
INTRAMUSCULAR | Status: AC
  Filled 2015-01-02: qty 5

## 2015-01-02 MED ORDER — HYDROMORPHONE HCL 2 MG/ML IJ SOLN
INTRAMUSCULAR | Status: AC
  Filled 2015-01-02: qty 1

## 2015-01-02 MED ORDER — ROCURONIUM BROMIDE 100 MG/10ML IV SOLN
INTRAVENOUS | Status: DC | PRN
  Administered 2015-01-02: 35 mg via INTRAVENOUS
  Administered 2015-01-02: 10 mg via INTRAVENOUS

## 2015-01-02 MED ORDER — SUCCINYLCHOLINE CHLORIDE 20 MG/ML IJ SOLN
INTRAMUSCULAR | Status: DC | PRN
  Administered 2015-01-02: 100 mg via INTRAVENOUS

## 2015-01-02 MED ORDER — OXYCODONE HCL 5 MG PO TABS: 5.0000 mg | ORAL_TABLET | ORAL | Status: DC | PRN

## 2015-01-02 MED ORDER — METOPROLOL TARTRATE 1 MG/ML IV SOLN
5.0000 mg | Freq: Four times a day (QID) | INTRAVENOUS | Status: DC | PRN
  Filled 2015-01-02: qty 5

## 2015-01-02 MED ORDER — NEOSTIGMINE METHYLSULFATE 10 MG/10ML IV SOLN
INTRAVENOUS | Status: DC | PRN
  Administered 2015-01-02: 5 mg via INTRAVENOUS

## 2015-01-02 MED ORDER — POLYETHYLENE GLYCOL 3350 17 G PO PACK: 17.0000 g | PACK | Freq: Two times a day (BID) | ORAL | Status: DC | PRN

## 2015-01-02 MED ORDER — CEFAZOLIN SODIUM-DEXTROSE 2-3 GM-% IV SOLR
INTRAVENOUS | Status: AC
Start: 2015-01-02 — End: 2015-01-02
  Filled 2015-01-02: qty 50

## 2015-01-02 MED ORDER — PROMETHAZINE HCL 25 MG/ML IJ SOLN
INTRAMUSCULAR | Status: AC
  Filled 2015-01-02: qty 1

## 2015-01-02 MED ORDER — ACETAMINOPHEN 500 MG PO TABS
1000.0000 mg | ORAL_TABLET | Freq: Three times a day (TID) | ORAL | Status: DC
  Filled 2015-01-02 (×3): qty 2

## 2015-01-02 MED ORDER — FENTANYL CITRATE 0.05 MG/ML IJ SOLN
INTRAMUSCULAR | Status: DC | PRN
  Administered 2015-01-02 (×2): 100 ug via INTRAVENOUS
  Administered 2015-01-02: 50 ug via INTRAVENOUS
  Administered 2015-01-02 (×2): 100 ug via INTRAVENOUS
  Administered 2015-01-02: 50 ug via INTRAVENOUS

## 2015-01-02 MED ORDER — LORAZEPAM 2 MG/ML IJ SOLN: 0.5000 mg | Freq: Three times a day (TID) | INTRAMUSCULAR | Status: DC | PRN

## 2015-01-02 MED ORDER — ESMOLOL HCL 10 MG/ML IV SOLN
INTRAVENOUS | Status: DC | PRN
  Administered 2015-01-02: 40 mg via INTRAVENOUS
  Administered 2015-01-02 (×2): 20 mg via INTRAVENOUS

## 2015-01-02 MED ORDER — PROPOFOL 10 MG/ML IV BOLUS
INTRAVENOUS | Status: AC
  Filled 2015-01-02: qty 20

## 2015-01-02 MED ORDER — LIDOCAINE HCL (CARDIAC) 20 MG/ML IV SOLN
INTRAVENOUS | Status: DC | PRN
  Administered 2015-01-02: 100 mg via INTRAVENOUS

## 2015-01-02 MED ORDER — ONDANSETRON HCL 4 MG PO TABS
4.0000 mg | ORAL_TABLET | Freq: Four times a day (QID) | ORAL | Status: DC | PRN
  Administered 2015-01-04: 4 mg via ORAL
  Filled 2015-01-02: qty 1

## 2015-01-02 MED ORDER — EMTRICITABINE-TENOFOVIR DF 200-300 MG PO TABS: 1.0000 | ORAL_TABLET | Freq: Every day | ORAL | Status: DC

## 2015-01-02 MED ORDER — PROMETHAZINE HCL 25 MG/ML IJ SOLN
6.2500 mg | INTRAMUSCULAR | Status: DC | PRN
  Administered 2015-01-03 (×2): 12.5 mg via INTRAVENOUS
  Filled 2015-01-02 (×2): qty 1

## 2015-01-02 MED ORDER — HYDROMORPHONE HCL 1 MG/ML IJ SOLN
INTRAMUSCULAR | Status: DC | PRN
  Administered 2015-01-02: 0.5 mg via INTRAVENOUS

## 2015-01-02 MED ORDER — BUPIVACAINE-EPINEPHRINE 0.25% -1:200000 IJ SOLN
INTRAMUSCULAR | Status: DC | PRN
  Administered 2015-01-02: 100 mL

## 2015-01-02 MED ORDER — NAPROXEN 500 MG PO TABS
500.0000 mg | ORAL_TABLET | Freq: Two times a day (BID) | ORAL | Status: DC | PRN
  Filled 2015-01-02: qty 1

## 2015-01-02 MED ORDER — SODIUM CHLORIDE 0.9 % IJ SOLN
INTRAMUSCULAR | Status: AC
  Filled 2015-01-02: qty 10

## 2015-01-02 MED ORDER — SODIUM CHLORIDE 0.9 % IJ SOLN
3.0000 mL | Freq: Two times a day (BID) | INTRAMUSCULAR | Status: DC
  Administered 2015-01-02 – 2015-01-03 (×2): 3 mL via INTRAVENOUS

## 2015-01-02 MED ORDER — MAGIC MOUTHWASH
15.0000 mL | Freq: Four times a day (QID) | ORAL | Status: DC | PRN
  Filled 2015-01-02: qty 15

## 2015-01-02 MED ORDER — ROCURONIUM BROMIDE 100 MG/10ML IV SOLN
INTRAVENOUS | Status: AC
  Filled 2015-01-02: qty 1

## 2015-01-02 MED ORDER — SODIUM CHLORIDE 0.9 % IJ SOLN: 3.0000 mL | INTRAMUSCULAR | Status: DC | PRN

## 2015-01-02 MED ORDER — VALACYCLOVIR HCL 500 MG PO TABS
1000.0000 mg | ORAL_TABLET | Freq: Every day | ORAL | Status: DC
  Administered 2015-01-03 – 2015-01-04 (×2): 1000 mg via ORAL
  Filled 2015-01-02 (×4): qty 2

## 2015-01-02 MED ORDER — PANTOPRAZOLE SODIUM 40 MG PO TBEC
80.0000 mg | DELAYED_RELEASE_TABLET | Freq: Every day | ORAL | Status: DC
  Administered 2015-01-02 – 2015-01-05 (×4): 80 mg via ORAL
  Filled 2015-01-02 (×5): qty 2

## 2015-01-02 MED ORDER — ALUM & MAG HYDROXIDE-SIMETH 200-200-20 MG/5ML PO SUSP
30.0000 mL | Freq: Four times a day (QID) | ORAL | Status: DC | PRN
  Administered 2015-01-03 – 2015-01-04 (×3): 30 mL via ORAL
  Filled 2015-01-02 (×3): qty 30

## 2015-01-02 MED ORDER — 0.9 % SODIUM CHLORIDE (POUR BTL) OPTIME
TOPICAL | Status: DC | PRN
  Administered 2015-01-02: 1000 mL

## 2015-01-02 MED ORDER — PROMETHAZINE HCL 25 MG/ML IJ SOLN
12.5000 mg | Freq: Once | INTRAMUSCULAR | Status: AC
  Administered 2015-01-02: 12.5 mg via INTRAVENOUS

## 2015-01-02 MED ORDER — DARUNAVIR-COBICISTAT 800-150 MG PO TABS: 1.0000 | ORAL_TABLET | Freq: Every day | ORAL | Status: DC

## 2015-01-02 MED ORDER — ONDANSETRON HCL 4 MG/2ML IJ SOLN
INTRAMUSCULAR | Status: DC | PRN
  Administered 2015-01-02: 4 mg via INTRAVENOUS

## 2015-01-02 MED ORDER — ONDANSETRON HCL 4 MG/2ML IJ SOLN
4.0000 mg | Freq: Four times a day (QID) | INTRAMUSCULAR | Status: DC | PRN
  Administered 2015-01-02: 4 mg via INTRAVENOUS
  Filled 2015-01-02: qty 2

## 2015-01-02 MED ORDER — BUPIVACAINE-EPINEPHRINE 0.25% -1:200000 IJ SOLN
INTRAMUSCULAR | Status: AC
  Filled 2015-01-02: qty 2

## 2015-01-02 MED ORDER — BISACODYL 10 MG RE SUPP: 10.0000 mg | Freq: Two times a day (BID) | RECTAL | Status: DC | PRN

## 2015-01-02 MED ORDER — DEXAMETHASONE SODIUM PHOSPHATE 10 MG/ML IJ SOLN
INTRAMUSCULAR | Status: AC
  Filled 2015-01-02: qty 1

## 2015-01-02 MED ORDER — DEXAMETHASONE SODIUM PHOSPHATE 10 MG/ML IJ SOLN
INTRAMUSCULAR | Status: DC | PRN
  Administered 2015-01-02: 10 mg via INTRAVENOUS

## 2015-01-02 MED ORDER — MENTHOL 3 MG MT LOZG
1.0000 | LOZENGE | OROMUCOSAL | Status: DC | PRN
  Filled 2015-01-02: qty 9

## 2015-01-02 MED ORDER — LIP MEDEX EX OINT
1.0000 "application " | TOPICAL_OINTMENT | Freq: Two times a day (BID) | CUTANEOUS | Status: DC
  Administered 2015-01-02 – 2015-01-05 (×6): 1 via TOPICAL
  Filled 2015-01-02: qty 7

## 2015-01-02 MED ORDER — GLYCOPYRROLATE 0.2 MG/ML IJ SOLN
INTRAMUSCULAR | Status: DC | PRN
  Administered 2015-01-02: .8 mg via INTRAVENOUS

## 2015-01-02 MED ORDER — DIPHENHYDRAMINE HCL 50 MG/ML IJ SOLN: 12.5000 mg | Freq: Four times a day (QID) | INTRAMUSCULAR | Status: DC | PRN

## 2015-01-02 MED ORDER — ADULT MULTIVITAMIN W/MINERALS CH
1.0000 | ORAL_TABLET | Freq: Every day | ORAL | Status: DC
  Administered 2015-01-03 – 2015-01-04 (×2): 1 via ORAL
  Filled 2015-01-02 (×4): qty 1

## 2015-01-02 MED ORDER — METOPROLOL TARTRATE 12.5 MG HALF TABLET
12.5000 mg | ORAL_TABLET | Freq: Two times a day (BID) | ORAL | Status: DC | PRN
  Administered 2015-01-04 (×2): 12.5 mg via ORAL
  Filled 2015-01-02 (×3): qty 1

## 2015-01-02 MED ORDER — LACTATED RINGERS IV SOLN
INTRAVENOUS | Status: DC
  Administered 2015-01-02: 1000 mL via INTRAVENOUS
  Administered 2015-01-02: 15:00:00 via INTRAVENOUS
  Administered 2015-01-02: 1000 mL via INTRAVENOUS

## 2015-01-02 MED ORDER — HYDROMORPHONE HCL 1 MG/ML IJ SOLN: 0.2500 mg | INTRAMUSCULAR | Status: DC | PRN

## 2015-01-02 MED ORDER — GABAPENTIN 300 MG PO CAPS
300.0000 mg | ORAL_CAPSULE | Freq: Three times a day (TID) | ORAL | Status: DC
  Filled 2015-01-02 (×4): qty 1

## 2015-01-02 MED ORDER — HYDROMORPHONE HCL 1 MG/ML IJ SOLN
0.5000 mg | INTRAMUSCULAR | Status: DC | PRN
  Administered 2015-01-02 – 2015-01-03 (×4): 1 mg via INTRAVENOUS
  Filled 2015-01-02 (×4): qty 1

## 2015-01-02 MED ORDER — CEFAZOLIN SODIUM-DEXTROSE 2-3 GM-% IV SOLR
2.0000 g | INTRAVENOUS | Status: AC
  Administered 2015-01-02: 2 g via INTRAVENOUS

## 2015-01-02 MED ORDER — PROMETHAZINE HCL 25 MG PO TABS: 12.5000 mg | ORAL_TABLET | Freq: Three times a day (TID) | ORAL | Status: DC | PRN

## 2015-01-02 MED ORDER — MIDAZOLAM HCL 2 MG/2ML IJ SOLN
INTRAMUSCULAR | Status: AC
  Filled 2015-01-02: qty 2

## 2015-01-02 MED ORDER — CEFAZOLIN SODIUM 1-5 GM-% IV SOLN
1.0000 g | Freq: Four times a day (QID) | INTRAVENOUS | Status: AC
  Administered 2015-01-03 (×2): 1 g via INTRAVENOUS
  Filled 2015-01-02 (×4): qty 50

## 2015-01-02 SURGICAL SUPPLY — 47 items
APPLIER CLIP 5 13 M/L LIGAMAX5 (MISCELLANEOUS)
APR CLP MED LRG 5 ANG JAW (MISCELLANEOUS)
BINDER ABDOMINAL 12 ML 46-62 (SOFTGOODS) ×2 IMPLANT
BLADE HEX COATED 2.75 (ELECTRODE) IMPLANT
CABLE HI FREQUENCY MONOPOLAR (ELECTROSURGICAL) ×3 IMPLANT
CATH KIT ON Q 7.5IN SLV (PAIN MANAGEMENT) ×4 IMPLANT
CATH KIT ON-Q SILVERSOAK 7.5 (CATHETERS) IMPLANT
CATH KIT ON-Q SILVERSOAK 7.5IN (CATHETERS) IMPLANT
CHLORAPREP W/TINT 26ML (MISCELLANEOUS) ×3 IMPLANT
CLIP APPLIE 5 13 M/L LIGAMAX5 (MISCELLANEOUS) IMPLANT
CLOSURE WOUND 1/2 X4 (GAUZE/BANDAGES/DRESSINGS) ×1
DECANTER SPIKE VIAL GLASS SM (MISCELLANEOUS) ×3 IMPLANT
DEVICE SECURE STRAP 25 ABSORB (INSTRUMENTS) ×4 IMPLANT
DEVICE TROCAR PUNCTURE CLOSURE (ENDOMECHANICALS) ×3 IMPLANT
DRAPE LAPAROSCOPIC ABDOMINAL (DRAPES) ×3 IMPLANT
DRAPE UTILITY XL STRL (DRAPES) ×3 IMPLANT
DRAPE WARM FLUID 44X44 (DRAPE) ×3 IMPLANT
DRSG TEGADERM 2-3/8X2-3/4 SM (GAUZE/BANDAGES/DRESSINGS) ×5 IMPLANT
ELECT REM PT RETURN 9FT ADLT (ELECTROSURGICAL) ×3
ELECTRODE REM PT RTRN 9FT ADLT (ELECTROSURGICAL) ×1 IMPLANT
GAUZE SPONGE 2X2 8PLY STRL LF (GAUZE/BANDAGES/DRESSINGS) IMPLANT
GLOVE ECLIPSE 8.0 STRL XLNG CF (GLOVE) ×5 IMPLANT
GLOVE INDICATOR 8.0 STRL GRN (GLOVE) ×5 IMPLANT
GOWN STRL REUS W/TWL XL LVL3 (GOWN DISPOSABLE) ×6 IMPLANT
KIT BASIN OR (CUSTOM PROCEDURE TRAY) ×3 IMPLANT
MARKER SKIN DUAL TIP RULER LAB (MISCELLANEOUS) ×3 IMPLANT
MESH VENTRALIGHT ST 8X10 (Mesh General) ×2 IMPLANT
NDL SPNL 22GX3.5 QUINCKE BK (NEEDLE) IMPLANT
NEEDLE SPNL 22GX3.5 QUINCKE BK (NEEDLE) IMPLANT
PENCIL BUTTON HOLSTER BLD 10FT (ELECTRODE) IMPLANT
SCISSORS LAP 5X35 DISP (ENDOMECHANICALS) ×3 IMPLANT
SET IRRIG TUBING LAPAROSCOPIC (IRRIGATION / IRRIGATOR) ×2 IMPLANT
SHEARS HARMONIC ACE PLUS 36CM (ENDOMECHANICALS) IMPLANT
SLEEVE XCEL OPT CAN 5 100 (ENDOMECHANICALS) ×8 IMPLANT
SPONGE GAUZE 2X2 STER 10/PKG (GAUZE/BANDAGES/DRESSINGS) ×2
STRIP CLOSURE SKIN 1/2X4 (GAUZE/BANDAGES/DRESSINGS) ×3 IMPLANT
SUT MNCRL AB 4-0 PS2 18 (SUTURE) ×3 IMPLANT
SUT PDS AB 1 CTX 36 (SUTURE) ×2 IMPLANT
SUT PROLENE 1 CT 1 30 (SUTURE) ×20 IMPLANT
TOWEL OR 17X26 10 PK STRL BLUE (TOWEL DISPOSABLE) ×3 IMPLANT
TOWEL OR NON WOVEN STRL DISP B (DISPOSABLE) ×3 IMPLANT
TRAY FOLEY CATH 14FRSI W/METER (CATHETERS) ×2 IMPLANT
TRAY LAPAROSCOPIC (CUSTOM PROCEDURE TRAY) ×3 IMPLANT
TROCAR BLADELESS OPT 5 100 (ENDOMECHANICALS) ×3 IMPLANT
TROCAR XCEL NON-BLD 11X100MML (ENDOMECHANICALS) IMPLANT
TUBING INSUFFLATION 10FT LAP (TUBING) ×3 IMPLANT
TUNNELER SHEATH ON-Q 16GX12 DP (PAIN MANAGEMENT) ×2 IMPLANT

## 2015-01-02 NOTE — Interval H&P Note (Signed)
History and Physical Interval Note:  01/02/2015 1:23 PM  [name]  has presented today for surgery, with the diagnosis of Incisional and Umbilical Hernias  The various methods of treatment have been discussed with the patient and family. After consideration of risks, benefits and other options for treatment, the patient has consented to  Procedure(s): LAPAROSCOPIC VENTRAL WALL HERNIA REPAIR (N/A) as a surgical intervention .  The patient's history has been reviewed, patient examined, no change in status, stable for surgery.  I have reviewed the patient's chart and labs.  Questions were answered to the patient's satisfaction.     Darlene Bartelt C.

## 2015-01-02 NOTE — Anesthesia Procedure Notes (Signed)
Procedure Name: Intubation Date/Time: 01/02/2015 2:59 PM Performed by: Orest DikesPETERS, Chidera Dearcos J Pre-anesthesia Checklist: Patient identified, Emergency Drugs available, Suction available and Patient being monitored Patient Re-evaluated:Patient Re-evaluated prior to inductionOxygen Delivery Method: Circle System Utilized Preoxygenation: Pre-oxygenation with 100% oxygen Intubation Type: IV induction Ventilation: Mask ventilation without difficulty Laryngoscope Size: Mac and 4 Grade View: Grade I Tube type: Oral Number of attempts: 1 Airway Equipment and Method: Stylet and Oral airway Placement Confirmation: ETT inserted through vocal cords under direct vision,  positive ETCO2 and breath sounds checked- equal and bilateral Secured at: 21 cm Tube secured with: Tape Dental Injury: Teeth and Oropharynx as per pre-operative assessment

## 2015-01-02 NOTE — H&P (View-Only) (Signed)
[name] 12/03/2014 1:42 PM Location: [facility] Patient #: [id number] DOB: 08/10/1966 Single / Language: English / Race: Black or African American Female History of Present Illness ([name] C. Halla Chopp MD; 12/03/2014 2:30 PM) Patient words: umb hernia.  The patient is a 49 year old female who presents with an umbilical hernia. Patient sent for surgical consultation by emergency physician, Melanie Belfi, MD, for concern of umbilical hernia Pleasant obese female. HIV positive. Follow closely with infectious disease, Dr Van Dam. Low viral counts. No history of recent infections. She felt episode of sharp pain just above her belly button. With severe. Went to the emergency room. Concern for hernia. Eventually able to be reduced. She still feels some intermittent pain. He has to work to get it reduced. Normally has about every day. Can walk about a half hour without much difficulty. Recalls having a diagnostic laparoscopy done through her navel a few decades ago for fertility evaluation. No fevers chills or sweats. Other Problems (Sonya Bynum, CMA; 12/03/2014 1:43 PM) Gastroesophageal Reflux Disease HIV-positive Umbilical Hernia Repair  Past Surgical History (Sonya Bynum, CMA; 12/03/2014 1:43 PM) Hysterectomy (not due to cancer) - Partial Tonsillectomy  Diagnostic Studies History (Sonya Bynum, CMA; 12/03/2014 1:43 PM) Colonoscopy never Mammogram 1-3 years ago Pap Smear 1-5 years ago  Allergies (Sonya Bynum, CMA; 12/03/2014 1:44 PM) Morphine Sulfate (Concentrate) *ANALGESICS - OPIOID*  Medication History (Sonya Bynum, CMA; 12/03/2014 1:44 PM) Metoprolol Succinate ER (50MG Tablet ER 24HR, Oral) Active. Gabapentin (300MG Capsule, Oral) Active. Stribild (150-150-200-300MG Tablet, Oral) Active. Omeprazole (20MG Capsule DR, Oral) Active.  Social History (Sonya Bynum, CMA; 12/03/2014 1:43 PM) Alcohol use Occasional alcohol use. Caffeine use Tea. Tobacco  use Former smoker.  Family History (Sonya Bynum, CMA; 12/03/2014 1:43 PM) Hypertension Brother, Father, Mother, Sister.  Pregnancy / Birth History (Sonya Bynum, CMA; 12/03/2014 1:43 PM) Age at menarche 11 years. Gravida 2 Maternal age <15 Para 1     Review of Systems (Sonya Bynum CMA; 12/03/2014 1:43 PM) General Present- Night Sweats and Weight Gain. Not Present- Appetite Loss, Chills, Fatigue, Fever and Weight Loss. Cardiovascular Present- Leg Cramps. Not Present- Chest Pain, Difficulty Breathing Lying Down, Palpitations, Rapid Heart Rate, Shortness of Breath and Swelling of Extremities. Gastrointestinal Present- Abdominal Pain and Excessive gas. Not Present- Bloating, Bloody Stool, Change in Bowel Habits, Chronic diarrhea, Constipation, Difficulty Swallowing, Gets full quickly at meals, Hemorrhoids, Indigestion, Nausea, Rectal Pain and Vomiting. Female Genitourinary Present- Frequency and Nocturia. Not Present- Painful Urination, Pelvic Pain and Urgency. Musculoskeletal Present- Muscle Weakness. Not Present- Back Pain, Joint Pain, Joint Stiffness, Muscle Pain and Swelling of Extremities. Neurological Present- Numbness and Tingling. Not Present- Decreased Memory, Fainting, Headaches, Seizures, Tremor, Trouble walking and Weakness. Endocrine Present- Hot flashes. Not Present- Cold Intolerance, Excessive Hunger, Hair Changes, Heat Intolerance and New Diabetes. Hematology Present- HIV. Not Present- Easy Bruising, Excessive bleeding, Gland problems and Persistent Infections.  Vitals (Sonya Bynum CMA; 12/03/2014 1:43 PM) 12/03/2014 1:43 PM Weight: 222 lb Height: 66in Body Surface Area: 2.17 m Body Mass Index: 35.83 kg/m Temp.: 98F(Temporal)  Pulse: 79 (Regular)  BP: 140/82 (Sitting, Left Arm, Standard)     Physical Exam (Katrina Daddona C. Kee Drudge MD; 12/03/2014 2:28 PM)  General Mental Status-Alert. General Appearance-Not in acute distress, Not Sickly. Orientation-Oriented  X3. Hydration-Well hydrated. Voice-Normal.  Integumentary Global Assessment Upon inspection and palpation of skin surfaces of the - Axillae: non-tender, no inflammation or ulceration, no drainage. and Distribution of scalp and body hair is normal. General Characteristics Temperature - normal   warmth is noted.  Head and Neck Head-normocephalic, atraumatic with no lesions or palpable masses. Face Global Assessment - atraumatic, no absence of expression. Neck Global Assessment - no abnormal movements, no bruit auscultated on the right, no bruit auscultated on the left, no decreased range of motion, non-tender. Trachea-midline. Thyroid Gland Characteristics - non-tender.  Eye Eyeball - Left-Extraocular movements intact, No Nystagmus. Eyeball - Right-Extraocular movements intact, No Nystagmus. Cornea - Left-No Hazy. Cornea - Right-No Hazy. Sclera/Conjunctiva - Left-No scleral icterus, No Discharge. Sclera/Conjunctiva - Right-No scleral icterus, No Discharge. Pupil - Left-Direct reaction to light normal. Pupil - Right-Direct reaction to light normal.  ENMT Ears Pinna - Left - no drainage observed, no generalized tenderness observed. Right - no drainage observed, no generalized tenderness observed. Nose and Sinuses External Inspection of the Nose - no destructive lesion observed. Inspection of the nares - Left - quiet respiration. Right - quiet respiration. Mouth and Throat Lips - Upper Lip - no fissures observed, no pallor noted. Lower Lip - no fissures observed, no pallor noted. Nasopharynx - no discharge present. Oral Cavity/Oropharynx - Tongue - no dryness observed. Oral Mucosa - no cyanosis observed. Hypopharynx - no evidence of airway distress observed.  Chest and Lung Exam Inspection Movements - Normal and Symmetrical. Accessory muscles - No use of accessory muscles in breathing. Palpation Palpation of the chest reveals -  Non-tender. Auscultation Breath sounds - Normal and Clear.  Cardiovascular Auscultation Rhythm - Regular. Murmurs & Other Heart Sounds - Auscultation of the heart reveals - No Murmurs and No Systolic Clicks.  Abdomen Inspection Inspection of the abdomen reveals - No Visible peristalsis and No Abnormal pulsations. Umbilicus - No Bleeding, No Urine drainage. Palpation/Percussion Palpation and Percussion of the abdomen reveal - Soft, Non Tender, No Rebound tenderness, No Rigidity (guarding) and No Cutaneous hyperesthesia. Note: Apple body habitus. Obese but soft. 1 cm umbilical hernia. 3 cm supraumbilical mass is not reducible. Concerning for supraumbilical incisional hernia. Incarcerated   Female Genitourinary Sexual Maturity Tanner 5 - Adult hair pattern. Note: Normal external female genitalia. No inguinal hernias No vaginal bleeding nor discharge   Peripheral Vascular Upper Extremity Inspection - Left - No Cyanotic nailbeds, Not Ischemic. Right - No Cyanotic nailbeds, Not Ischemic.  Neurologic Neurologic evaluation reveals -normal attention span and ability to concentrate, able to name objects and repeat phrases. Appropriate fund of knowledge , normal sensation and normal coordination. Mental Status Affect - not angry, not paranoid. Cranial Nerves-Normal Bilaterally. Gait-Normal.  Neuropsychiatric Mental status exam performed with findings of-able to articulate well with normal speech/language, rate, volume and coherence, thought content normal with ability to perform basic computations and apply abstract reasoning and no evidence of hallucinations, delusions, obsessions or homicidal/suicidal ideation.  Musculoskeletal Global Assessment Spine, Ribs and Pelvis - no instability, subluxation or laxity. Right Upper Extremity - no instability, subluxation or laxity.  Lymphatic Head & Neck  General Head & Neck Lymphatics: Bilateral - Description - No Localized  lymphadenopathy. Axillary  General Axillary Region: Bilateral - Description - No Localized lymphadenopathy. Femoral & Inguinal  Generalized Femoral & Inguinal Lymphatics: Left - Description - No Localized lymphadenopathy. Right - Description - No Localized lymphadenopathy.    Assessment & Plan (Braydyn Schultes C. Zalyn Amend MD; 12/03/2014 2:32 PM)  INCARCERATED INCISIONAL HERNIA (552.21  K43.0) Impression: I think she has 2 hernias : supraumbilical incisional hernia and an umbilical hernia. Would benefit from surgery. Given her obesity and immunosuppressed state, would benefit from mesh underlay repair. Laparoscopic repair. Hopefully outpatient & does   not need to stay long but is on chronic pain meds so may be at risk for need to stay overnight.  She is very interested in proceeding. She would prefer to do it at [facility] if possible.  The anatomy & physiology of the abdominal wall was discussed. The pathophysiology of hernias was discussed. Natural history risks without surgery including progeressive enlargement, pain, incarceration & strangulation was discussed. Contributors to complications such as smoking, obesity, diabetes, prior surgery, etc were discussed.  I feel the risks of no intervention will lead to serious problems that outweigh the operative risks; therefore, I recommended surgery to reduce and repair the hernia. I explained laparoscopic techniques with possible need for an open approach. I noted the probable use of mesh to patch and/or buttress the hernia repair  Risks such as bleeding, infection, abscess, need for further treatment, heart attack, death, and other risks were discussed. I noted a good likelihood this will help address the problem. Goals of post-operative recovery were discussed as well. Possibility that this will not correct all symptoms was explained. I stressed the importance of low-impact activity, aggressive pain control, avoiding constipation, & not pushing through pain  to minimize risk of post-operative chronic pain or injury. Possibility of reherniation especially with smoking, obesity, diabetes, immunosuppression, and other health conditions was discussed. We will work to minimize complications.  An educational handout further explaining the pathology & treatment options was given as well. Questions were answered. The patient expresses understanding & wishes to proceed with surgery.  Current Plans Schedule for Surgery Pt Education - [facility] Hernia Post-Op HCI (Gaston Dase): discussed with patient and provided information. Pt Education - [facility] Good Bowel Health (Wm Sahagun) Pt Education - [facility] Pain Control (Eudora Guevarra) UMBILICAL HERNIA WITHOUT OBSTRUCTION AND WITHOUT GANGRENE (553.1  K42.9)  Semiah Konczal C. Tyaire Odem, M.D., F.A.C.S. Gastrointestinal and Minimally Invasive Surgery [facility] [address], [address] [place], [place] [address] [phone] Main / Paging   

## 2015-01-02 NOTE — Anesthesia Postprocedure Evaluation (Signed)
  Anesthesia Post-op Note  Patient: [name]  Procedure(s) Performed: Procedure(s): LAPAROSCOPIC VENTRAL WALL HERNIA REPAIR (N/A)  Patient Location: PACU  Anesthesia Type:General  Level of Consciousness: awake, oriented, sedated and patient cooperative  Airway and Oxygen Therapy: Patient Spontanous Breathing  Post-op Pain: mild  Post-op Assessment: Post-op Vital signs reviewed, Patient's Cardiovascular Status Stable, Respiratory Function Stable, Patent Airway, No signs of Nausea or vomiting and Pain level controlled  Post-op Vital Signs: stable  Last Vitals:  Filed Vitals:   01/02/15 1739  BP:   Pulse: 77  Temp:   Resp: 17    Complications: No apparent anesthesia complications

## 2015-01-02 NOTE — Transfer of Care (Signed)
Immediate Anesthesia Transfer of Care Note  Patient: [name]  Procedure(s) Performed: Procedure(s): LAPAROSCOPIC VENTRAL WALL HERNIA REPAIR (N/A)  Patient Location: PACU  Anesthesia Type:General  Level of Consciousness: awake, alert , oriented and patient cooperative  Airway & Oxygen Therapy: Patient Spontanous Breathing and Patient connected to face mask oxygen  Post-op Assessment: Report given to RN, Post -op Vital signs reviewed and stable and Patient moving all extremities  Post vital signs: Reviewed and stable  Last Vitals:  Filed Vitals:   01/02/15 1202  BP: 146/82  Pulse: 77  Temp: 36.6 C  Resp: 18    Complications: No apparent anesthesia complications

## 2015-01-02 NOTE — Op Note (Signed)
01/02/2015  4:50 PM  PATIENT:  [name]  49 y.o. female  Patient Care Team: Madison HickmanMelanie Martin, MD as PCP - General (Internal Medicine) Randall Hissornelius N Van Dam, MD as PCP - Infectious Diseases (Infectious Diseases)  PRE-OPERATIVE DIAGNOSIS:  Incisional and Umbilical Hernias  POST-OPERATIVE DIAGNOSIS:    Incarcerated Incisional and Umbilical Hernias  PROCEDURE:    Laparoscopic lysis of adhesions 60 minutes (half of case) LAPAROSCOPIC VENTRAL WALL HERNIA REPAIR  SURGEON:  Surgeon(s): Karie SodaSteven Husain Costabile, MD  ASSISTANT: RN   ANESTHESIA:   local and general  EBL:  Total I/O In: 1000 [I.V.:1000] Out: 125 [Urine:100; Blood:25]  Delay start of Pharmacological VTE agent (>24hrs) due to surgical blood loss or risk of bleeding:  no  DRAINS: none   SPECIMEN:  No Specimen  DISPOSITION OF SPECIMEN:  N/A  COUNTS:  YES  PLAN OF CARE: Discharge to home after PACU  PATIENT DISPOSITION:  PACU - hemodynamically stable.  INDICATION: Pleasant patient has developed a ventral wall abdominal hernia.   Recommendation was made for surgical repair:  The anatomy & physiology of the abdominal wall was discussed. The pathophysiology of hernias was discussed. Natural history risks without surgery including progeressive enlargement, pain, incarceration & strangulation was discussed. Contributors to complications such as smoking, obesity, diabetes, prior surgery, etc were discussed.  I feel the risks of no intervention will lead to serious problems that outweigh the operative risks; therefore, I recommended surgery to reduce and repair the hernia. I explained laparoscopic techniques with possible need for an open approach. I noted the probable use of mesh to patch and/or buttress the hernia repair  Risks such as bleeding, infection, abscess, need for further treatment, heart attack, death, and other risks were discussed. I noted a good likelihood this will help address the problem. Goals of post-operative  recovery were discussed as well. Possibility that this will not correct all symptoms was explained. I stressed the importance of low-impact activity, aggressive pain control, avoiding constipation, & not pushing through pain to minimize risk of post-operative chronic pain or injury. Possibility of reherniation especially with smoking, obesity, diabetes, immunosuppression, and other health conditions was discussed. We will work to minimize complications.  An educational handout further explaining the pathology & treatment options was given as well. Questions were answered. The patient expresses understanding & wishes to proceed with surgery.   OR FINDINGS: Supraumbilical and umbilical incarcerated ventral hernias.  3 to have by 3 cm region.  Infraumbilical incarcerated ventral hernia.  5 x 2 cm.   Type of repair - Laparoscopic underlay repair   Name of mesh - Bard Ventralight dual sided (polypropylene / Seprafilm)  Size of mesh - Length 25 cm, Width 20 cm  Mesh overlap - 5-7 cm  Placement of mesh - Intraperitoneal underlay repair  Patient had a 6x4x3 cystic mass consistent with simple left ovarian cyst.    DESCRIPTION:   Informed consent was confirmed. The patient underwent general anaesthesia without difficulty. The patient was positioned appropriately. VTE prevention in place. The patient's abdomen was clipped, prepped, & draped in a sterile fashion. Surgical timeout confirmed our plan. The patient was positioned in reverse Trendelenburg. Abdominal entry was gained using optical entry technique in the left upper abdomen. Entry was clean. I induced carbon dioxide insufflation. Camera inspection revealed no injury. Extra ports were carefully placed under direct laparoscopic visualization.   I could see greater omentum adherent to the mid anterior abdominal wall.  I carefully freed it off using cold scissors with some focused  cautery.  I did laparoscopic lysis of adhesions to expose the entire  anterior abdominal wall.  I primarily used and focused cold scissors.    Some occasional focused cautery.  Eventually reduced out omentum around supraumbilical and umbilical hernias.  Also very dense adhesions of greater omentum to the infraumbilical diastases recti with Swiss cheese ventral hernias as well.   I did free off the peritoneum off the infraumbilical abdominal wall.  That helped expose the pubis as well.  This allowed me to have enough room.  I made sure hemostasis was good.  I mapped out the region using a needle passer.   To ensure that I would have at least 5 cm radial coverage outside of the hernia defect, I chose a 25X20 cm dual sided mesh.  I placed #1 Prolene stitches around its edge about every 5 cm = 14 total.  I rolled the mesh & placed into the peritoneal cavity through the 10 cm fascial defect.  I unrolled  the mesh and positioned it appropriately.  I secured the mesh to cover up the hernia defect using a laparoscopic suture passer to pass the tails of the Prolene through the abdominal wall & tagged them with clamps.  I started out in four corners to make sure I had the mesh centered over the hernia defect appropriately, and then proceeded to work in quadrants.  We evacuated CO2 & desufflated the abdomen.  I tied the fascial stitches down.  I reinsufflated the abdomen.  The mesh provided at least 5-10 cm circumferential coverage around the entire region of hernia defects.   I tacked the edges & central part of the mesh to the peritoneum/posterior rectus fascia with  SecureStrap absorbable tacks.   Hemostasis was excellent.  I closed the fascia port site on the 10 mm port using a 0 Vicryl stitch using laparoscopic intracorporeal suture passer.  I then placed On-Q catheter sheaths in the preperitoneal plane under direct laparoscopic guidance from the subxiphoid region down to the posterior iliac crests in the lower flanks. I did reinspection. Hemostasis was good. Mesh laid well.  Capnoperitoneum was evacuated. Ports were removed. The skin was closed with Monocryl at the port sites and Steri-Strips on the fascial stitch puncture sites. OnQ catheters were placed and the sheathes peeled away. On-Q pump was secured. Patient is being extubated to go to the recovery room. I updated the status of the patient to the patient's family & boyfriend.  I made recommendations.  I answered questions.  Understanding & appreciation was expressed.    Ardeth Sportsman, M.D., F.A.C.S. Gastrointestinal and Minimally Invasive Surgery [facility] [address] [place], [place] [address] [phone] Main / Paging

## 2015-01-02 NOTE — Anesthesia Preprocedure Evaluation (Signed)
Anesthesia Evaluation  Patient identified by MRN, date of birth, ID band Patient awake    Reviewed: Allergy & Precautions, NPO status , Patient's Chart, lab work & pertinent test results  Airway        Dental   Pulmonary former smoker,          Cardiovascular hypertension,     Neuro/Psych  Headaches,    GI/Hepatic GERD-  ,  Endo/Other    Renal/GU      Musculoskeletal   Abdominal   Peds  Hematology  (+) anemia ,   Anesthesia Other Findings HIV  Reproductive/Obstetrics                             Anesthesia Physical Anesthesia Plan  ASA: II  Anesthesia Plan: General   Post-op Pain Management:    Induction: Intravenous  Airway Management Planned: Oral ETT  Additional Equipment:   Intra-op Plan:   Post-operative Plan: Extubation in OR  Informed Consent: I have reviewed the patients History and Physical, chart, labs and discussed the procedure including the risks, benefits and alternatives for the proposed anesthesia with the patient or authorized representative who has indicated his/her understanding and acceptance.     Plan Discussed with: CRNA, Anesthesiologist and Surgeon  Anesthesia Plan Comments:         Anesthesia Quick Evaluation

## 2015-01-03 ENCOUNTER — Encounter (HOSPITAL_COMMUNITY): Payer: Self-pay | Admitting: *Deleted

## 2015-01-03 DIAGNOSIS — R1084 Generalized abdominal pain: Secondary | ICD-10-CM

## 2015-01-03 DIAGNOSIS — K430 Incisional hernia with obstruction, without gangrene: Secondary | ICD-10-CM | POA: Diagnosis not present

## 2015-01-03 DIAGNOSIS — Z9114 Patient's other noncompliance with medication regimen: Secondary | ICD-10-CM

## 2015-01-03 DIAGNOSIS — Z87891 Personal history of nicotine dependence: Secondary | ICD-10-CM

## 2015-01-03 DIAGNOSIS — F1210 Cannabis abuse, uncomplicated: Secondary | ICD-10-CM

## 2015-01-03 DIAGNOSIS — B20 Human immunodeficiency virus [HIV] disease: Secondary | ICD-10-CM

## 2015-01-03 DIAGNOSIS — Z9889 Other specified postprocedural states: Secondary | ICD-10-CM

## 2015-01-03 MED ORDER — SODIUM CHLORIDE 0.9 % IV SOLN: 250.0000 mL | INTRAVENOUS | Status: DC | PRN

## 2015-01-03 MED ORDER — DARUNAVIR-COBICISTAT 800-150 MG PO TABS
1.0000 | ORAL_TABLET | Freq: Every day | ORAL | Status: DC
  Administered 2015-01-03 – 2015-01-05 (×3): 1 via ORAL
  Filled 2015-01-03 (×3): qty 1

## 2015-01-03 MED ORDER — METOPROLOL SUCCINATE 12.5 MG HALF TABLET
12.5000 mg | ORAL_TABLET | Freq: Every day | ORAL | Status: DC
  Administered 2015-01-03 – 2015-01-04 (×2): 12.5 mg via ORAL
  Filled 2015-01-03 (×3): qty 1

## 2015-01-03 MED ORDER — SODIUM CHLORIDE 0.9 % IJ SOLN
3.0000 mL | INTRAMUSCULAR | Status: DC | PRN
Start: 2015-01-03 — End: 2015-01-05

## 2015-01-03 MED ORDER — POLYETHYLENE GLYCOL 3350 17 G PO PACK
17.0000 g | PACK | Freq: Two times a day (BID) | ORAL | Status: DC
  Administered 2015-01-03 – 2015-01-05 (×4): 17 g via ORAL
  Filled 2015-01-03 (×5): qty 1

## 2015-01-03 MED ORDER — OXYCODONE HCL 5 MG PO TABS
10.0000 mg | ORAL_TABLET | ORAL | Status: DC | PRN
  Administered 2015-01-03 – 2015-01-05 (×6): 10 mg via ORAL
  Filled 2015-01-03 (×6): qty 2

## 2015-01-03 MED ORDER — STERILE WATER FOR INJECTION IJ SOLN
INTRAMUSCULAR | Status: AC
  Administered 2015-01-03: 07:00:00
  Filled 2015-01-03: qty 10

## 2015-01-03 MED ORDER — SODIUM CHLORIDE 0.9 % IJ SOLN
3.0000 mL | Freq: Two times a day (BID) | INTRAMUSCULAR | Status: DC
  Administered 2015-01-03 (×2): 3 mL via INTRAVENOUS

## 2015-01-03 MED ORDER — ACETAMINOPHEN 500 MG PO TABS
1000.0000 mg | ORAL_TABLET | Freq: Four times a day (QID) | ORAL | Status: DC
  Administered 2015-01-03 – 2015-01-05 (×7): 1000 mg via ORAL
  Filled 2015-01-03 (×9): qty 2

## 2015-01-03 MED ORDER — METOCLOPRAMIDE HCL 5 MG/ML IJ SOLN: 5.0000 mg | Freq: Four times a day (QID) | INTRAMUSCULAR | Status: DC | PRN

## 2015-01-03 MED ORDER — STERILE WATER FOR INJECTION IJ SOLN
INTRAMUSCULAR | Status: AC
  Administered 2015-01-03: 10 mL
  Filled 2015-01-03: qty 10

## 2015-01-03 MED ORDER — EMTRICITABINE-TENOFOVIR DF 200-300 MG PO TABS
1.0000 | ORAL_TABLET | Freq: Every day | ORAL | Status: DC
  Administered 2015-01-03 – 2015-01-05 (×3): 1 via ORAL
  Filled 2015-01-03 (×3): qty 1

## 2015-01-03 MED ORDER — GABAPENTIN 400 MG PO CAPS
400.0000 mg | ORAL_CAPSULE | Freq: Three times a day (TID) | ORAL | Status: DC
  Administered 2015-01-03 – 2015-01-04 (×4): 400 mg via ORAL
  Filled 2015-01-03 (×9): qty 1

## 2015-01-03 NOTE — Consult Note (Signed)
[facility]  Date of Admission:  01/02/2015  Date of Consult:  01/03/2015  Reason for Consult: HIV+ Referring Physician: primary care f/u  Impression/Recommendation HIV+ Abdominal Hernias HTN   Would- Continue her perioperative anbx as written Her anti-retrovirals have been changed back to her previous regimen- prezcobix and truvada.  Hopefully she will be adherent.  Can f/u with Dr Daiva Eves in 2-3 weeks after d/c.   Thank you so much for this interesting consult,   Johny Sax (pager) [phone] [email or web address]  [name] is an 49 y.o. female.  HPI: 49 yo F who is HIV+ since 2005. She has had difficulty with adherence to her ART and recently had her medication changed to genvoya. She had resistance testing done at that time and was found to have a virus resistant to one of the components of this medication.  Today she complains of abd pain. She underwent repair of her abd hernias and lysis of adhesions on 3-3.   Past Medical History  Diagnosis Date  . HIV infection   . Shingles     2000  . Anemia   . Hypertension     not on medication   . GERD (gastroesophageal reflux disease)   . Headache     hx of migraines   . Myocardial infarction     mild MI 09/2004     no heart problems since  . Incarcerated incisional hernia s/p lap VWH repair 01/02/2015 12/18/2014    Past Surgical History  Procedure Laterality Date  . Cesarean section    . Abdominal hysterectomy  1993    Partial  . Tonsillectomy  1988     Allergies  Allergen Reactions  . Morphine Other (See Comments)    Burning under the skin  . Doxycycline Hyclate Rash    Medications:  Scheduled: . acetaminophen  1,000 mg Oral QID  .  ceFAZolin (ANCEF) IV  1 g Intravenous Q6H  . darunavir-cobicistat  1 tablet Oral Daily  . emtricitabine-tenofovir  1 tablet Oral Daily  . gabapentin  400 mg Oral TID  . heparin subcutaneous  5,000 Units Subcutaneous 3 times per day    . lip balm  1 application Topical BID  . metoprolol succinate  12.5 mg Oral QHS  . multivitamin with minerals  1 tablet Oral QHS  . pantoprazole  80 mg Oral Daily  . polyethylene glycol  17 g Oral BID  . sodium chloride  3 mL Intravenous Q12H  . sodium chloride  3 mL Intravenous Q12H  . valACYclovir  1,000 mg Oral QHS    Abtx:  Anti-infectives    Start     Dose/Rate Route Frequency Ordered Stop   01/03/15 1200  darunavir-cobicistat (PREZCOBIX) 800-150 MG per tablet 1 tablet     1 tablet Oral Daily 01/03/15 1057     01/03/15 1200  emtricitabine-tenofovir (TRUVADA) 200-300 MG per tablet 1 tablet     1 tablet Oral Daily 01/03/15 1057     01/02/15 2200  valACYclovir (VALTREX) tablet 1,000 mg     1,000 mg Oral Daily at bedtime 01/02/15 1924     01/02/15 2000  ceFAZolin (ANCEF) IVPB 1 g/50 mL premix     1 g 100 mL/hr over 30 Minutes Intravenous Every 6 hours 01/02/15 1924 01/03/15 1359   01/02/15 1930  darunavir-cobicistat (PREZCOBIX) 800-150 MG per tablet 1 tablet  Status:  Discontinued     1 tablet Oral Daily 01/02/15 1924 01/02/15 1936  01/02/15 1930  emtricitabine-tenofovir (TRUVADA) 200-300 MG per tablet 1 tablet  Status:  Discontinued     1 tablet Oral Daily 01/02/15 1924 01/02/15 1936   01/02/15 1215  ceFAZolin (ANCEF) IVPB 2 g/50 mL premix     2 g 100 mL/hr over 30 Minutes Intravenous On call to O.R. 01/02/15 1204 01/02/15 1405      Total days of antibiotics 1 (perioperative ancef)          Social History:  reports that she has quit smoking. Her smoking use included Cigarettes. She has never used smokeless tobacco. She reports that she drinks alcohol. She reports that she uses illicit drugs (Marijuana) about 14 times per week.  Family History  Problem Relation Age of Onset  . Diabetes type II Brother   . Migraines Neg Hx     General ROS: hungry, no BM. no oral ulcers. normal urination. see HPI.   Blood pressure 131/75, pulse 99, temperature 98.8 F (37.1 C),  temperature source Oral, resp. rate 20, height 5\' 4"  (1.626 m), weight 100.245 kg (221 lb), SpO2 95 %. General appearance: alert, cooperative and no distress Eyes: negative findings: conjunctivae and sclerae normal and pupils equal, round, reactive to light and accomodation Throat: normal findings: oropharynx pink & moist without lesions or evidence of thrush and small area on R lower lip from bite wound.  Neck: no adenopathy and supple, symmetrical, trachea midline Lungs: clear to auscultation bilaterally Heart: regular rate and rhythm Abdomen: mid-upper abd wound is clean. peri-umbilical wound is clean. BS+, soft.  Extremities: edema none   No results found for this or any previous visit (from the past 48 hour(s)).    Component Value Date/Time   SDES CSF 12/26/2011 1747   SPECREQUEST NONE 12/26/2011 1747   CULT NO GROWTH 3 DAYS 12/26/2011 1740   REPTSTATUS 12/26/2011 FINAL 12/26/2011 1747   No results found. Recent Results (from the past 240 hour(s))  Surgical pcr screen     Status: None   Collection Time: 12/30/14  9:46 AM  Result Value Ref Range Status   MRSA, PCR NEGATIVE NEGATIVE Final   Staphylococcus aureus NEGATIVE NEGATIVE Final    Comment:        The Xpert SA Assay (FDA approved for NASAL specimens in patients over 50 years of age), is one component of a comprehensive surveillance program.  Test performance has been validated by [facility] for patients greater than or equal to 50 year old. It is not intended to diagnose infection nor to guide or monitor treatment.       01/03/2015, 1:34 PM

## 2015-01-03 NOTE — Progress Notes (Signed)
Pt had nausea during the night. Was not able to tolerate clears, therefore I didn't advance diet. Attempted to ambulate pt, but as she stood beside bed she stated that she "felt woozy and thought she was going to get sick on her stomach" so we sat her back down on the bed. There was no order to d/c foley, so we left it in until further orders. Will continue to monitor.

## 2015-01-03 NOTE — Progress Notes (Signed)
Pt refused her metoprolol last night stating she was afraid it would "drop her bp too low". I advised pt that we would monitor her bp, but she still declined.

## 2015-01-03 NOTE — Progress Notes (Signed)
UR completed 

## 2015-01-03 NOTE — Progress Notes (Signed)
[facility]  [address]  [place], [place] [address] Phone: [phone] FAX: [phone]    [name] [id number] October 24, 1966  CARE TEAM:  PCP: Madison Hickman, MD  Outpatient Care Team: Patient Care Team: Madison Hickman, MD as PCP - General (Internal Medicine) Randall Hiss, MD as PCP - Infectious Diseases (Infectious Diseases)  Inpatient Treatment Team: Treatment Team: Attending Provider: Karie Soda, MD; Registered Nurse: Bethann Goo, RN; Technician: Karl Pock, NT; Technician: Fawn Kirk, NT  Problem List:   Principal Problem:   Incarcerated incisional hernia s/p lap VWH repair 01/02/2015 Active Problems:   Human immunodeficiency virus (HIV) disease   Postmenopausal disorder   Morbid obesity   Benign essential HTN   1 Day Post-Op  Procedure(s): LAPAROSCOPIC VENTRAL WALL HERNIA REPAIR  Assessment  Fair  Plan:  -Improved pain control.  Improve nausea control.  Low-dose beta blocker for hypertension.  She still refuses to take it, so far  Proton pump inhibitor for GERD.  Maalox or breakthrough heartburn.  Consider relaxing abdominal binder if she is feeling full  -VTE prophylaxis- SCDs, etc  -mobilize as tolerated to help recovery.  GET HER UP!!  I updated the patient's status to the patient.  Recommendations were made.  Questions were answered.  The patient expressed understanding & appreciation.   Ardeth Sportsman, M.D., F.A.C.S. Gastrointestinal and Minimally Invasive Surgery [facility] [address] [place], [place] [address] [phone] Main / Paging   01/03/2015  Subjective:  Feeling rather sore.  Needing IV medications.  Staying in bed.  Struggled with some reflux.  Nurse noted patient spit up a few times.  Objective:  Vital signs:  Filed Vitals:   01/02/15 1945 01/02/15 2145 01/03/15 0025 01/03/15 0530  BP: 156/82  153/88 166/89 129/78  Pulse: 82 78 100 99  Temp: 97.7 F (36.5 C) 98.4 F (36.9 C) 98 F (36.7 C) 98.2 F (36.8 C)  TempSrc: Oral Oral Oral Oral  Resp: Height:      Weight:      SpO2: 100% 97% 100% 97%       Intake/Output   Yesterday:  03/03 0701 - 03/04 0700 In: 3650 [I.V.:3600; IV Piggyback:50] Out: 1325 [Urine:1300; Blood:25] This shift:     Bowel function:  Flatus: Y  BM: N  Drain: N/A  Physical Exam:  General: Pt awake/alert/oriented x4 in no acute distress.  Tired but not toxic Eyes: PERRL, normal EOM.  Sclera clear.  No icterus Neuro: CN II-XII intact w/o focal sensory/motor deficits. Lymph: No head/neck/groin lymphadenopathy Psych:  No delerium/psychosis/paranoia HENT: Normocephalic, Mucus membranes moist.  No thrush Neck: Supple, No tracheal deviation Chest: No chest wall pain w good excursion CV:  Pulses intact.  Regular rhythm MS: Normal AROM mjr joints.  No obvious deformity Abdomen: Soft.  Obese.  Binder on.  Nondistended.  Mod tender at incisions only.  No evidence of peritonitis.  No incarcerated hernias. Ext:  SCDs BLE.  No mjr edema.  No cyanosis Skin: No petechiae / purpura  Results:   Labs: No results found for this or any previous visit (from the past 48 hour(s)).  Imaging / Studies: No results found.  Medications / Allergies: per chart  Antibiotics: Anti-infectives    Start     Dose/Rate Route Frequency Ordered Stop   01/02/15 2200  valACYclovir (VALTREX) tablet 1,000 mg     1,000 mg Oral Daily at bedtime  01/02/15 1924     01/02/15 2000  ceFAZolin (ANCEF) IVPB 1 g/50 mL premix     1 g 100 mL/hr over 30 Minutes Intravenous Every 6 hours 01/02/15 1924 01/03/15 1359   01/02/15 1930  darunavir-cobicistat (PREZCOBIX) 800-150 MG per tablet 1 tablet  Status:  Discontinued     1 tablet Oral Daily 01/02/15 1924 01/02/15 1936   01/02/15 1930  emtricitabine-tenofovir (TRUVADA) 200-300 MG per tablet 1 tablet  Status:   Discontinued     1 tablet Oral Daily 01/02/15 1924 01/02/15 1936   01/02/15 1215  ceFAZolin (ANCEF) IVPB 2 g/50 mL premix     2 g 100 mL/hr over 30 Minutes Intravenous On call to O.R. 01/02/15 1204 01/02/15 1405       Note: Portions of this report may have been transcribed using voice recognition software. Every effort was made to ensure accuracy; however, inadvertent computerized transcription errors may be present.   Any transcriptional errors that result from this process are unintentional.     Ardeth SportsmanSteven C. Malone Vanblarcom, M.D., F.A.C.S. Gastrointestinal and Minimally Invasive Surgery [facility] [address] [place], [place] [address] [phone] Main / Paging   01/03/2015

## 2015-01-04 DIAGNOSIS — K430 Incisional hernia with obstruction, without gangrene: Secondary | ICD-10-CM | POA: Diagnosis not present

## 2015-01-04 NOTE — Progress Notes (Signed)
UR completed 

## 2015-01-04 NOTE — Progress Notes (Signed)
2 Days Post-Op  Subjective: Pt continues to be sore. She does endorse getting up and walking x2 yesterday. She was encouraged to continue to ambulate as much as tolerated. She complains early satiety on full liquids with some nausea. No vomiting. She states she is still too sore to go home. She has flatus and urination without difficulty. No BMs.  Objective: Vital signs in last 24 hours: Temp:  [98.1 F (36.7 C)-100.2 F (37.9 C)] 98.1 F (36.7 C) (03/05 0459) Pulse Rate:  [85-99] 85 (03/05 0459) Resp:  [18-20] 18 (03/05 0459) BP: (131-147)/(72-80) 143/72 mmHg (03/05 0459) SpO2:  [95 %-98 %] 97 % (03/05 0459) Last BM Date: 01/02/15  Intake/Output from previous day: 03/04 0701 - 03/05 0700 In: 846 [P.O.:840; I.V.:6] Out: 2350 [Urine:2350] Intake/Output this shift:    PE: General- In NAD Abdomen- Slightly distended, incision clean and dry with OnQ pump in place. Hypoactive bowel sounds.  Lab Results:  No results for input(s): WBC, HGB, HCT, PLT in the last 72 hours. BMET No results for input(s): NA, K, CL, CO2, GLUCOSE, BUN, CREATININE, CALCIUM in the last 72 hours. PT/INR No results for input(s): LABPROT, INR in the last 72 hours. Comprehensive Metabolic Panel:    Component Value Date/Time   NA 141 12/30/2014 1035   NA 144 12/03/2014 1100   K 4.2 12/30/2014 1035   K 4.9 12/03/2014 1100   CL 105 12/30/2014 1035   CL 104 12/03/2014 1100   CO2 28 12/30/2014 1035   CO2 30 12/03/2014 1100   BUN 12 12/30/2014 1035   BUN 14 12/03/2014 1100   CREATININE 0.92 12/30/2014 1035   CREATININE 0.89 12/03/2014 1100   CREATININE 1.05 08/30/2014 1015   CREATININE 0.96 12/25/2011 0600   GLUCOSE 89 12/30/2014 1035   GLUCOSE 85 12/03/2014 1100   CALCIUM 9.3 12/30/2014 1035   CALCIUM 9.8 12/03/2014 1100   AST 18 12/03/2014 1100   AST 15 08/30/2014 1015   ALT 32 12/03/2014 1100   ALT 20 08/30/2014 1015   ALKPHOS 114 12/03/2014 1100   ALKPHOS 116 08/30/2014 1015   BILITOT 0.3  12/03/2014 1100   BILITOT 0.4 08/30/2014 1015   PROT 7.3 12/03/2014 1100   PROT 7.4 08/30/2014 1015   ALBUMIN 4.1 12/03/2014 1100   ALBUMIN 4.6 08/30/2014 1015     Studies/Results: No results found.  Anti-infectives: Anti-infectives    Start     Dose/Rate Route Frequency Ordered Stop   01/03/15 1200  darunavir-cobicistat (PREZCOBIX) 800-150 MG per tablet 1 tablet     1 tablet Oral Daily 01/03/15 1057     01/03/15 1200  emtricitabine-tenofovir (TRUVADA) 200-300 MG per tablet 1 tablet     1 tablet Oral Daily 01/03/15 1057     01/02/15 2200  valACYclovir (VALTREX) tablet 1,000 mg     1,000 mg Oral Daily at bedtime 01/02/15 1924     01/02/15 2000  ceFAZolin (ANCEF) IVPB 1 g/50 mL premix     1 g 100 mL/hr over 30 Minutes Intravenous Every 6 hours 01/02/15 1924 01/03/15 1359   01/02/15 1930  darunavir-cobicistat (PREZCOBIX) 800-150 MG per tablet 1 tablet  Status:  Discontinued     1 tablet Oral Daily 01/02/15 1924 01/02/15 1936   01/02/15 1930  emtricitabine-tenofovir (TRUVADA) 200-300 MG per tablet 1 tablet  Status:  Discontinued     1 tablet Oral Daily 01/02/15 1924 01/02/15 1936   01/02/15 1215  ceFAZolin (ANCEF) IVPB 2 g/50 mL premix     2 g  100 mL/hr over 30 Minutes Intravenous On call to O.R. 01/02/15 1204 01/02/15 1405      Assessment Principal Problem:   Incarcerated incisional hernia s/p lap VWH repair 01/02/2015 Active Problems:   Human immunodeficiency virus (HIV) disease   Postmenopausal disorder   Morbid obesity   Benign essential HTN      Plan: Release abdominal binder to try and relieve fullness, advance diet if tolerated. Encourage pt to continue to ambulate as much as tolerated. ICE abdomen. Possible discharge tomorrow.  [name] PA-S 01/04/2015

## 2015-01-05 DIAGNOSIS — K430 Incisional hernia with obstruction, without gangrene: Secondary | ICD-10-CM | POA: Diagnosis not present

## 2015-01-05 NOTE — Progress Notes (Signed)
3 Days Post-Op  Subjective: Feels better.  Passing gas.  Tolerating solid diet.  Objective: Vital signs in last 24 hours: Temp:  [98.9 F (37.2 C)-99.5 F (37.5 C)] 99 F (37.2 C) (03/06 0707) Pulse Rate:  [73-92] 73 (03/06 0707) Resp:  [18] 18 (03/06 0707) BP: (139-152)/(81-89) 146/82 mmHg (03/06 0707) SpO2:  [97 %-98 %] 97 % (03/06 0707) Last BM Date: 01/02/15  Intake/Output from previous day: 03/05 0701 - 03/06 0700 In: 120 [P.O.:120] Out: 775 [Urine:775] Intake/Output this shift:    PE: General- In NAD Abdomen-soft, incisions are clean and intact  Lab Results:  No results for input(s): WBC, HGB, HCT, PLT in the last 72 hours. BMET No results for input(s): NA, K, CL, CO2, GLUCOSE, BUN, CREATININE, CALCIUM in the last 72 hours. PT/INR No results for input(s): LABPROT, INR in the last 72 hours. Comprehensive Metabolic Panel:    Component Value Date/Time   NA 141 12/30/2014 1035   NA 144 12/03/2014 1100   K 4.2 12/30/2014 1035   K 4.9 12/03/2014 1100   CL 105 12/30/2014 1035   CL 104 12/03/2014 1100   CO2 28 12/30/2014 1035   CO2 30 12/03/2014 1100   BUN 12 12/30/2014 1035   BUN 14 12/03/2014 1100   CREATININE 0.92 12/30/2014 1035   CREATININE 0.89 12/03/2014 1100   CREATININE 1.05 08/30/2014 1015   CREATININE 0.96 12/25/2011 0600   GLUCOSE 89 12/30/2014 1035   GLUCOSE 85 12/03/2014 1100   CALCIUM 9.3 12/30/2014 1035   CALCIUM 9.8 12/03/2014 1100   AST 18 12/03/2014 1100   AST 15 08/30/2014 1015   ALT 32 12/03/2014 1100   ALT 20 08/30/2014 1015   ALKPHOS 114 12/03/2014 1100   ALKPHOS 116 08/30/2014 1015   BILITOT 0.3 12/03/2014 1100   BILITOT 0.4 08/30/2014 1015   PROT 7.3 12/03/2014 1100   PROT 7.4 08/30/2014 1015   ALBUMIN 4.1 12/03/2014 1100   ALBUMIN 4.6 08/30/2014 1015     Studies/Results: No results found.  Anti-infectives: Anti-infectives    Start     Dose/Rate Route Frequency Ordered Stop   01/03/15 1200  darunavir-cobicistat  (PREZCOBIX) 800-150 MG per tablet 1 tablet     1 tablet Oral Daily 01/03/15 1057     01/03/15 1200  emtricitabine-tenofovir (TRUVADA) 200-300 MG per tablet 1 tablet     1 tablet Oral Daily 01/03/15 1057     01/02/15 2200  valACYclovir (VALTREX) tablet 1,000 mg     1,000 mg Oral Daily at bedtime 01/02/15 1924     01/02/15 2000  ceFAZolin (ANCEF) IVPB 1 g/50 mL premix     1 g 100 mL/hr over 30 Minutes Intravenous Every 6 hours 01/02/15 1924 01/03/15 1359   01/02/15 1930  darunavir-cobicistat (PREZCOBIX) 800-150 MG per tablet 1 tablet  Status:  Discontinued     1 tablet Oral Daily 01/02/15 1924 01/02/15 1936   01/02/15 1930  emtricitabine-tenofovir (TRUVADA) 200-300 MG per tablet 1 tablet  Status:  Discontinued     1 tablet Oral Daily 01/02/15 1924 01/02/15 1936   01/02/15 1215  ceFAZolin (ANCEF) IVPB 2 g/50 mL premix     2 g 100 mL/hr over 30 Minutes Intravenous On call to O.R. 01/02/15 1204 01/02/15 1405      Assessment Principal Problem:   Incarcerated incisional hernia s/p lap [facility] repair 01/02/2015-bowel function returning; feels better overall Active Problems:   Human immunodeficiency virus (HIV) disease   Morbid obesity   Benign essential HTN  Plan: Discharge today.  Discharge instructions given to her.   Izzac Rockett J 01/05/2015

## 2015-01-05 NOTE — Discharge Summary (Signed)
Physician Discharge Summary  Patient ID: [name] MRN: [id number] DOB/AGE: 1966/04/30 49 y.o.  Admit date: 01/02/2015 Discharge date: 01/05/2015  Admission Diagnoses:  Chronically incarcerated incisional hernia  Discharge Diagnoses:  Principal Problem:   Incarcerated incisional hernia s/p lap [place] repair 01/02/2015 Active Problems:   Human immunodeficiency virus (HIV) disease   Postmenopausal disorder   Morbid obesity   Benign essential HTN   Discharged Condition: good  Hospital Course: She underwent the above procedure and had a mild postop ileus afterward which resolved.  She met discharge criteria on 01/05/15 and was discharged to home.  Instructions were given to her.   Discharge Exam: Blood pressure 146/82, pulse 73, temperature 99 F (37.2 C), temperature source Oral, resp. rate 18, height '5\' 4"'  (1.626 m), weight 221 lb (100.245 kg), SpO2 97 %.   Disposition: 01-Home or Self Care  Discharge Instructions    Call MD for:  extreme fatigue    Complete by:  As directed      Call MD for:  hives    Complete by:  As directed      Call MD for:  persistant nausea and vomiting    Complete by:  As directed      Call MD for:  redness, tenderness, or signs of infection (pain, swelling, redness, odor or green/yellow discharge around incision site)    Complete by:  As directed      Call MD for:  severe uncontrolled pain    Complete by:  As directed      Call MD for:    Complete by:  As directed   Temperature > 101.30F     Diet - low sodium heart healthy    Complete by:  As directed      Discharge instructions    Complete by:  As directed   Please see discharge instruction sheets.  Also refer to handout given an office.  Please call our office if you have any questions or concerns [phone]     Discharge wound care:    Complete by:  As directed   If you have closed incisions, shower and bathe over these incisions with soap and water every day.  Remove all surgical  dressings on postoperative day #3.  You do not need to replace dressings over the closed incisions unless you feel more comfortable with a Band-Aid covering it.   If you have an open wound that requires packing, please see wound care instructions.  In general, remove all dressings, wash wound with soap and water and then replace with saline moistened gauze.  Do the dressing change at least every day.  Please call our office [phone] if you have further questions.     Driving Restrictions    Complete by:  As directed   No driving until off narcotics and can safely swerve away without pain during an emergency     Increase activity slowly    Complete by:  As directed   Walk an hour a day.  Use 20-30 minute walks.  When you can walk 30 minutes without difficulty, increase to low impact/moderate activities such as biking, jogging, swimming, sexual activity..  Eventually can increase to unrestricted activity when not feeling pain.  If you feel pain: STOP!Marland Kitchen   Let pain protect you from overdoing it.  Use ice/heat/over-the-counter pain medications to help minimize his soreness.  Use pain prescriptions as needed to remain active.  It is better to take extra pain medications and be more active  than to stay bedridden to avoid all pain medications.     Lifting restrictions    Complete by:  As directed   Avoid heavy lifting initially.  Do not push through pain.  You have no specific weight limit.  Coughing and sneezing or four more stressful to your incision than any lifting you will do. Pain will protect you from injury.  Therefore, avoid intense activity until off all narcotic pain medications.  Coughing and sneezing or four more stressful to your incision than any lifting he will do.     May shower / Bathe    Complete by:  As directed      May walk up steps    Complete by:  As directed      Sexual Activity Restrictions    Complete by:  As directed   Sexual activity as tolerated.  Do not push through  pain.  Pain will protect you from injury.     Walk with assistance    Complete by:  As directed   Walk over an hour a day.  May use a walker/cane/companion to help with balance and stamina.            Medication List    TAKE these medications        ALKA-SELTZER PLUS COLD PO  Take 1 each by mouth at bedtime as needed (cold).     darunavir-cobicistat 800-150 MG per tablet  Commonly known as:  PREZCOBIX  Take 1 tablet by mouth daily. Swallow whole. Do NOT crush, break or chew tablets. Take with food.     emtricitabine-tenofovir 200-300 MG per tablet  Commonly known as:  TRUVADA  Take 1 tablet by mouth daily.     gabapentin 300 MG capsule  Commonly known as:  NEURONTIN  Take 1 capsule (300 mg total) by mouth 3 (three) times daily.     metoprolol succinate 50 MG 24 hr tablet  Commonly known as:  TOPROL-XL  Take 50 mg by mouth at bedtime. Take with or immediately following a meal. As of 12/30/2014  Patient states she spoke with MD and after surgery if her blood pressure does not decrease with medication then she will start blood pressure medication.     multivitamin with minerals Tabs tablet  Take 1 tablet by mouth at bedtime. Centrum Silver     omeprazole 20 MG capsule  Commonly known as:  PRILOSEC  Take 1 capsule (20 mg total) by mouth 2 (two) times daily.     oxyCODONE 5 MG immediate release tablet  Commonly known as:  Oxy IR/ROXICODONE  Take 1-2 tablets (5-10 mg total) by mouth every 4 (four) hours as needed for moderate pain, severe pain or breakthrough pain.     promethazine 25 MG tablet  Commonly known as:  PHENERGAN  Take 0.5 tablets (12.5 mg total) by mouth every 8 (eight) hours as needed for nausea.     valACYclovir 1000 MG tablet  Commonly known as:  VALTREX  Take 1 tablet (1,000 mg total) by mouth daily.           Follow-up Information    Follow up with GROSS,STEVEN C., MD. Schedule an appointment as soon as possible for a visit in 3 weeks.   Specialty:   General Surgery   Why:  To follow up after your operation, To follow up after your hospital stay   Contact information:   [address] [place] [address] [phone]  Signed: Odis Hollingshead 01/05/2015, 8:28 AM

## 2015-01-05 NOTE — Discharge Instructions (Signed)
Patient Education Sheet   HERNIA REPAIR: POST OP INSTRUCTIONS  1. DIET: Follow a light bland diet the first 24 hours after arrival home, such as soup, liquids, crackers, etc.  Be sure to include lots of fluids daily.  Avoid fast food or heavy meals as your are more likely to get nauseated.  Eat a low fat the next few days after surgery. 2. Take your usually prescribed home medications unless otherwise directed. 3. PAIN CONTROL: a. Pain is best controlled by a usual combination of three different methods TOGETHER: i. Ice/Heat ii. Over the counter pain medication iii. Prescription pain medication b. Most patients will experience some swelling and bruising around the hernia(s) such as the bellybutton, groins, or old incisions.  Ice packs or heating pads (30-60 minutes up to 6 times a day) will help. Use ice for the first few days to help decrease swelling and bruising, then switch to heat to help relax tight/sore spots and speed recovery.  Some people prefer to use ice alone, heat alone, alternating between ice & heat.  Experiment to what works for you.  Swelling and bruising can take several weeks to resolve.   c. It is helpful to take an over-the-counter pain medication regularly for the first few weeks.  Choose one of the following that works best for you: i. Naproxen (Aleve, etc)  Two 220mg  tabs twice a day ii. Ibuprofen (Advil, etc) Three 200mg  tabs four times a day (every meal & bedtime) iii. Acetaminophen (Tylenol, etc) 325-650mg  four times a day (every meal & bedtime) d. A  prescription for pain medication should be given to you upon discharge.  Take your pain medication as prescribed.  i. If you are having problems/concerns with the prescription medicine (does not control pain, nausea, vomiting, rash, itching, etc), please call us [phone] to see if we need to switch you to a different pain medicine that will work better for you and/or control your side effect better. ii. If you need a  refill on your pain medication, please contact your pharmacy.  They will contact our office to request authorization. Prescriptions will not be filled after 5 pm or on week-ends. 4. Avoid getting constipated.  Between the surgery and the pain medications, it is common to experience some constipation.  Increasing fluid intake and taking a fiber supplement (such as Metamucil, Citrucel, FiberCon, MiraLax, etc) 1-2 times a day regularly will usually help prevent this problem from occurring.  A mild laxative (prune juice, Milk of Magnesia, MiraLax, etc) should be taken according to package directions if there are no bowel movements after 48 hours.   5. Wash / shower every day.  You may shower over the dressings as they are waterproof.   6. Remove your waterproof bandages 5 days after surgery.  You may leave the incision open to air.  You may replace a dressing/Band-Aid to cover the incision for comfort if you wish.  Continue to shower over incision(s) after the dressing is off.    7. ACTIVITIES as tolerated:   a. You may resume regular (light) daily activities beginning the next day--such as daily self-care, walking, climbing stairs--gradually increasing activities as tolerated.  If you can walk 30 minutes without difficulty, it is safe to try more intense activity such as jogging, treadmill, bicycling, low-impact aerobics, swimming, etc. b. Save the most intensive and strenuous activity for last such as sit-ups, heavy lifting, contact sports, etc  Refrain from any heavy lifting or straining until you are off narcotics  for pain control.   c. DO NOT PUSH THROUGH PAIN.  Let pain be your guide: If it hurts to do something, don't do it.  Pain is your body warning you to avoid that activity for another week until the pain goes down. d. You may drive when you are no longer taking prescription pain medication, you can comfortably wear a seatbelt, and you can safely maneuver your car and apply brakes. e. Bonita Quin may have  sexual intercourse when it is comfortable.  8. FOLLOW UP in our office a. Please call [facility] at [phone] to set up an appointment to see your surgeon in the office for a follow-up appointment approximately 2-3 weeks after your surgery. b. Make sure that you call for this appointment the day you arrive home to insure a convenient appointment time. 9.  IF YOU HAVE DISABILITY OR FAMILY LEAVE FORMS, BRING THEM TO THE OFFICE FOR PROCESSING.  DO NOT GIVE THEM TO YOUR DOCTOR.  WHEN TO CALL us [phone]: 1. Poor pain control 2. Reactions / problems with new medications (rash/itching, nausea, etc)  3. Fever over 101.5 F (38.5 C) 4. Inability to urinate 5. Nausea and/or vomiting 6. Worsening swelling or bruising 7. Continued bleeding from incision. 8. Increased pain, redness, or drainage from the incision   The clinic staff is available to answer your questions during regular business hours (8:30am-5pm).  Please dont hesitate to call and ask to speak to one of our nurses for clinical concerns.   If you have a medical emergency, go to the nearest emergency room or call 911.  A surgeon from [facility] is always on call at the hospitals in [facility], [place] [address], [address], [place], [place]  [address] ?  [address] [address], [place], [place]   [address] MAIN: [phone] ? TOLL FREE: [phone] ? FAX: [phone] [email or web address]  Managing Pain  Pain after surgery or related to activity is often due to strain/injury to muscle, tendon, nerves and/or incisions.  This pain is usually short-term and will improve in a few months.   Many people find it helpful to do the following things TOGETHER to help speed the process of healing and to get back to regular activity more quickly:  1. Avoid heavy physical activity a.  no lifting greater than 20 pounds b. Do not push through the pain.  Listen to your body and avoid positions and  maneuvers than reproduce the pain c. Walking is okay as tolerated, but go slowly and stop when getting sore.  d. Remember: If it hurts to do it, then dont do it! 2. Take Anti-inflammatory medication  a. Take with food/snack around the clock for 1-2 weeks i. This helps the muscle and nerve tissues become less irritable and calm down faster b. Choose ONE of the following over-the-counter medications: i. Naproxen  tabs (ex. Aleve) 1-2 pills twice a day  ii. Ibuprofen  tabs (ex. Advil, Motrin) 3-4 pills with every meal and just before bedtime iii. Acetaminophen  tabs (Tylenol) 1-2 pills with every meal and just before bedtime 3. Use a Heating pad or Ice/Cold Pack a. 4-6 times a day b. May use warm bath/hottub  or showers 4. Try Gentle Massage and/or Stretching  a. at the area of pain many times a day b. stop if you feel pain - do not overdo it  Try these steps together to help you body heal faster and avoid making things get worse.  Doing just one of these things may not be enough.    If you are not getting better after two weeks or are noticing you are getting worse, contact our office for further advice; we may need to re-evaluate you & see what other things we can do to help.  GETTING TO GOOD BOWEL HEALTH. Irregular bowel habits such as constipation and diarrhea can lead to many problems over time.  Having one soft bowel movement a day is the most important way to prevent further problems.  The anorectal canal is designed to handle stretching and feces to safely manage our ability to get rid of solid waste (feces, poop, stool) out of our body.  BUT, hard constipated stools can act like ripping concrete bricks and diarrhea can be a burning fire to this very sensitive area of our body, causing inflamed hemorrhoids, anal fissures, increasing risk is perirectal abscesses, abdominal pain/bloating, an making irritable bowel worse.     The goal: ONE SOFT BOWEL MOVEMENT A DAY!  To have  soft, regular bowel movements:   Drink at least 8 tall glasses of water a day.    Take plenty of fiber.  Fiber is the undigested part of plant food that passes into the colon, acting s natures broom to encourage bowel motility and movement.  Fiber can absorb and hold large amounts of water. This results in a larger, bulkier stool, which is soft and easier to pass. Work gradually over several weeks up to 6 servings a day of fiber (25g a day even more if needed) in the form of: o Vegetables -- Root (potatoes, carrots, turnips), leafy green (lettuce, salad greens, celery, spinach), or cooked high residue (cabbage, broccoli, etc) o Fruit -- Fresh (unpeeled skin & pulp), Dried (prunes, apricots, cherries, etc ),  or stewed ( applesauce)  o Whole grain breads, pasta, etc (whole wheat)  o Bran cereals   Bulking Agents -- This type of water-retaining fiber generally is easily obtained each day by one of the following:  o Psyllium bran -- The psyllium plant is remarkable because its ground seeds can retain so much water. This product is available as Metamucil, Konsyl, Effersyllium, Per Diem Fiber, or the less expensive generic preparation in drug and health food stores. Although labeled a laxative, it really is not a laxative.  o Methylcellulose -- This is another fiber derived from wood which also retains water. It is available as Citrucel. o Polyethylene Glycol - and artificial fiber commonly called Miralax or Glycolax.  It is helpful for people with gassy or bloated feelings with regular fiber o Flax Seed - a less gassy fiber than psyllium  No reading or other relaxing activity while on the toilet. If bowel movements take longer than 5 minutes, you are too constipated  AVOID CONSTIPATION.  High fiber and water intake usually takes care of this.  Sometimes a laxative is needed to stimulate more frequent bowel movements, but   Laxatives are not a good long-term solution as it can wear the colon  out. o Osmotics (Milk of Magnesia, Fleets phosphosoda, Magnesium citrate, MiraLax, GoLytely) are safer than  o Stimulants (Senokot, Castor Oil, Dulcolax, Ex Lax)    o Do not take laxatives for more than 7days in a row.   IF SEVERELY CONSTIPATED, try a Bowel Retraining Program: o Do not use laxatives.  o Eat a diet high in roughage, such as bran cereals and leafy vegetables.  o Drink six (6) ounces of prune or apricot juice  each morning.  o Eat two (2) large servings of stewed fruit each day.  o Take one (1) heaping tablespoon of a psyllium-based bulking agent twice a day. Use sugar-free sweetener when possible to avoid excessive calories.  o Eat a normal breakfast.  o Set aside 15 minutes after breakfast to sit on the toilet, but do not strain to have a bowel movement.  o If you do not have a bowel movement by the third day, use an enema and repeat the above steps.   Controlling diarrhea o Switch to liquids and simpler foods for a few days to avoid stressing your intestines further. o Avoid dairy products (especially milk & ice cream) for a short time.  The intestines often can lose the ability to digest lactose when stressed. o Avoid foods that cause gassiness or bloating.  Typical foods include beans and other legumes, cabbage, broccoli, and dairy foods.  Every person has some sensitivity to other foods, so listen to our body and avoid those foods that trigger problems for you. o Adding fiber (Citrucel, Metamucil, psyllium, Miralax) gradually can help thicken stools by absorbing excess fluid and retrain the intestines to act more normally.  Slowly increase the dose over a few weeks.  Too much fiber too soon can backfire and cause cramping & bloating. o Probiotics (such as active yogurt, Align, etc) may help repopulate the intestines and colon with normal bacteria and calm down a sensitive digestive tract.  Most studies show it to be of mild help, though, and such products can be  costly. o Medicines: - Bismuth subsalicylate (ex. Kayopectate, Pepto Bismol) every 30 minutes for up to 6 doses can help control diarrhea.  Avoid if pregnant. - Loperamide (Immodium) can slow down diarrhea.  Start with two tablets (4mg  total) first and then try one tablet every 6 hours.  Avoid if you are having fevers or severe pain.  If you are not better or start feeling worse, stop all medicines and call your doctor for advice o Call your doctor if you are getting worse or not better.  Sometimes further testing (cultures, endoscopy, X-ray studies, bloodwork, etc) may be needed to help diagnose and treat the cause of the diarrhea.  Hernia A hernia occurs when an internal organ pushes out through a weak spot in the abdominal wall. Hernias most commonly occur in the groin and around the navel. Hernias often can be pushed back into place (reduced). Most hernias tend to get worse over time. Some abdominal hernias can get stuck in the opening (irreducible or incarcerated hernia) and cannot be reduced. An irreducible abdominal hernia which is tightly squeezed into the opening is at risk for impaired blood supply (strangulated hernia). A strangulated hernia is a medical emergency. Because of the risk for an irreducible or strangulated hernia, surgery may be recommended to repair a hernia. CAUSES   Heavy lifting.  Prolonged coughing.  Straining to have a bowel movement.  A cut (incision) made during an abdominal surgery. HOME CARE INSTRUCTIONS   Bed rest is not required. You may continue your normal activities.  Avoid lifting more than 10 pounds (4.5 kg) or straining.  Cough gently. If you are a smoker it is best to stop. Even the best hernia repair can break down with the continual strain of coughing. Even if you do not have your hernia repaired, a cough will continue to aggravate the problem.  Do not wear anything tight over your hernia. Do not try to keep it in  with an outside bandage or truss.  These can damage abdominal contents if they are trapped within the hernia sac.  Eat a normal diet.  Avoid constipation. Straining over long periods of time will increase hernia size and encourage breakdown of repairs. If you cannot do this with diet alone, stool softeners may be used. SEEK IMMEDIATE MEDICAL CARE IF:   You have a fever.  You develop increasing abdominal pain.  You feel nauseous or vomit.  Your hernia is stuck outside the abdomen, looks discolored, feels hard, or is tender.  You have any changes in your bowel habits or in the hernia that are unusual for you.  You have increased pain or swelling around the hernia.  You cannot push the hernia back in place by applying gentle pressure while lying down. MAKE SURE YOU:   Understand these instructions.  Will watch your condition.  Will get help right away if you are not doing well or get worse. Document Released: 10/18/2005 Document Revised: 01/10/2012 Document Reviewed: 06/06/2008 [facility] Patient Information 2015 [place], [place]. This information is not intended to replace advice given to you by your health care provider. Make sure you discuss any questions you have with your health care provider.

## 2015-02-04 ENCOUNTER — Other Ambulatory Visit: Payer: Medicare Other

## 2015-02-04 DIAGNOSIS — B20 Human immunodeficiency virus [HIV] disease: Secondary | ICD-10-CM

## 2015-02-04 LAB — CBC WITH DIFFERENTIAL/PLATELET
BASOS ABS: 0.1 10*3/uL (ref 0.0–0.1)
Basophils Relative: 1 % (ref 0–1)
Eosinophils Absolute: 0.3 10*3/uL (ref 0.0–0.7)
Eosinophils Relative: 4 % (ref 0–5)
HCT: 34.7 % — ABNORMAL LOW (ref 36.0–46.0)
Hemoglobin: 11 g/dL — ABNORMAL LOW (ref 12.0–15.0)
LYMPHS ABS: 3.6 10*3/uL (ref 0.7–4.0)
LYMPHS PCT: 45 % (ref 12–46)
MCH: 26.6 pg (ref 26.0–34.0)
MCHC: 31.7 g/dL (ref 30.0–36.0)
MCV: 83.8 fL (ref 78.0–100.0)
MPV: 9.4 fL (ref 8.6–12.4)
Monocytes Absolute: 0.4 10*3/uL (ref 0.1–1.0)
Monocytes Relative: 5 % (ref 3–12)
NEUTROS ABS: 3.6 10*3/uL (ref 1.7–7.7)
NEUTROS PCT: 45 % (ref 43–77)
PLATELETS: 558 10*3/uL — AB (ref 150–400)
RBC: 4.14 MIL/uL (ref 3.87–5.11)
RDW: 14.9 % (ref 11.5–15.5)
WBC: 8.1 10*3/uL (ref 4.0–10.5)

## 2015-02-04 LAB — COMPLETE METABOLIC PANEL WITH GFR
ALK PHOS: 116 U/L (ref 39–117)
ALT: 16 U/L (ref 0–35)
AST: 19 U/L (ref 0–37)
Albumin: 4.5 g/dL (ref 3.5–5.2)
BILIRUBIN TOTAL: 0.3 mg/dL (ref 0.2–1.2)
BUN: 10 mg/dL (ref 6–23)
CO2: 24 meq/L (ref 19–32)
CREATININE: 1.03 mg/dL (ref 0.50–1.10)
Calcium: 9.4 mg/dL (ref 8.4–10.5)
Chloride: 101 mEq/L (ref 96–112)
GFR, EST AFRICAN AMERICAN: 74 mL/min
GFR, EST NON AFRICAN AMERICAN: 64 mL/min
GLUCOSE: 83 mg/dL (ref 70–99)
Potassium: 4.3 mEq/L (ref 3.5–5.3)
Sodium: 138 mEq/L (ref 135–145)
Total Protein: 7.7 g/dL (ref 6.0–8.3)

## 2015-02-05 ENCOUNTER — Other Ambulatory Visit: Payer: Self-pay | Admitting: Surgery

## 2015-02-05 LAB — HIV-1 RNA QUANT-NO REFLEX-BLD
HIV 1 RNA Quant: 38 {copies}/mL — ABNORMAL HIGH
HIV-1 RNA Quant, Log: 1.58 {Log} — ABNORMAL HIGH

## 2015-02-05 LAB — T-HELPER CELL (CD4) - (RCID CLINIC ONLY)
CD4 % Helper T Cell: 15 % — ABNORMAL LOW (ref 33–55)
CD4 T Cell Abs: 550 /uL (ref 400–2700)

## 2015-02-19 ENCOUNTER — Ambulatory Visit (INDEPENDENT_AMBULATORY_CARE_PROVIDER_SITE_OTHER): Payer: Medicare Other | Admitting: Infectious Disease

## 2015-02-19 ENCOUNTER — Encounter: Payer: Self-pay | Admitting: Infectious Disease

## 2015-02-19 VITALS — BP 153/94 | HR 86 | Temp 98.4°F | Wt 211.8 lb

## 2015-02-19 DIAGNOSIS — K430 Incisional hernia with obstruction, without gangrene: Secondary | ICD-10-CM

## 2015-02-19 DIAGNOSIS — B20 Human immunodeficiency virus [HIV] disease: Secondary | ICD-10-CM | POA: Diagnosis not present

## 2015-02-19 DIAGNOSIS — I10 Essential (primary) hypertension: Secondary | ICD-10-CM

## 2015-02-19 DIAGNOSIS — M5481 Occipital neuralgia: Secondary | ICD-10-CM | POA: Diagnosis not present

## 2015-02-19 NOTE — Progress Notes (Signed)
Subjective:    Patient ID: [name], female    DOB: 05-04-1966, 49 y.o.   MRN: [id number]  HPI   39y ear old [place] American lady with HIV and AIDS who this  once again been able to perfectly suppress her viral load to <20 on prezista, norvir and truvada, in whom I changed to [place], back to P/N/T then Intelence and truvada.  I then changed her to  of intelence with Truvada. I told her she could dissolve the intelence but she dislikes the taste in apple juice or water   Finally changed to [facility], but then she failed with resistance to graces including raltegravir and EVG but not to DTG, we changed her back to PREZCOBIX and Truvada and she now has healthy virological suppression and CD4 count.  Blood pressure is still poorly controlled and she is still not started her Toprol which I explained to her last time is at the appropriate dose and I again explained that one cannot compare milligrams with a different drug classes.  .     Lab Results  Component Value Date   HIV1RNAQUANT 38* 02/04/2015   Lab Results  Component Value Date   CD4TABS 550 02/04/2015   CD4TABS 570 12/03/2014   CD4TABS 550 08/30/2014      Review of Systems  Constitutional: Negative for fever, chills, diaphoresis, activity change, appetite change, fatigue and unexpected weight change.  HENT: Negative for congestion, rhinorrhea, sinus pressure, sneezing, sore throat and trouble swallowing.   Eyes: Negative for photophobia and visual disturbance.  Respiratory: Negative for cough, chest tightness, shortness of breath, wheezing and stridor.   Cardiovascular: Negative for chest pain, palpitations and leg swelling.  Gastrointestinal: Negative for nausea, vomiting, abdominal pain, diarrhea, constipation, blood in stool, abdominal distention and anal bleeding.  Genitourinary: Negative for dysuria, hematuria, flank pain and difficulty urinating.  Musculoskeletal: Negative for myalgias, back  pain, joint swelling, arthralgias and gait problem.  Skin: Negative for color change, pallor, rash and wound.  Neurological: Negative for dizziness, tremors, weakness and light-headedness.  Hematological: Negative for adenopathy. Does not bruise/bleed easily.  Psychiatric/Behavioral: Negative for behavioral problems, confusion, sleep disturbance, dysphoric mood, decreased concentration and agitation.       Objective:   Physical Exam  Constitutional: She is oriented to person, place, and time. She appears well-developed and well-nourished. No distress.  HENT:  Head: Normocephalic and atraumatic.  Mouth/Throat: Oropharynx is clear and moist. No oropharyngeal exudate.  Eyes: Conjunctivae and EOM are normal. Pupils are equal, round, and reactive to light. No scleral icterus.  Neck: Normal range of motion. Neck supple. No JVD present.  Cardiovascular: Normal rate, regular rhythm and normal heart sounds.  Exam reveals no gallop and no friction rub.   No murmur heard. Pulmonary/Chest: Effort normal and breath sounds normal. No respiratory distress. She has no wheezes. She has no rales. She exhibits no tenderness.  Abdominal: She exhibits no distension and no mass. There is no tenderness. There is no rebound and no guarding.  Musculoskeletal: She exhibits no edema or tenderness.  Lymphadenopathy:    She has no cervical adenopathy.  Neurological: She is alert and oriented to person, place, and time. She has normal reflexes. She exhibits normal muscle tone. Coordination normal.  Skin: Skin is warm and dry. She is not diaphoretic. No erythema. No pallor.  Psychiatric: She has a normal mood and affect. Her behavior is normal. Judgment and thought content normal.          Assessment &  Plan:  HIV continue PREZCOBIX and Truvada. Probably best for her to stay on a protease inhibitor based regimen for a long time.   I spent greater than 25 minutes with the patient including greater than 50% of  time in face to face counsel of the patient and in coordination of their care.  I also have introduced her to Deirdre EvenerKim Epperson and will see if we can enroll into REPRIEVE including the substudy  She initially did not want to the study but is now reconsidering will cc Selena BattenKim  Headaches:  Being followed by Neurology  Hernia: sp surgery by Dr Karie SodaSteven Gross  HTN: she needs to start her Toprol

## 2015-04-16 ENCOUNTER — Telehealth: Payer: Self-pay | Admitting: *Deleted

## 2015-04-16 ENCOUNTER — Encounter: Payer: Self-pay | Admitting: *Deleted

## 2015-04-16 NOTE — Telephone Encounter (Signed)
Error

## 2015-04-16 NOTE — Telephone Encounter (Signed)
Copious nasal secretions, whitish and thick x several days.  No fever.  RN advised OTC Mucinex and OTC Claritin per the package instructions.  Pt verbalized understanding.

## 2015-04-24 ENCOUNTER — Other Ambulatory Visit: Payer: Self-pay | Admitting: Infectious Disease

## 2015-06-11 ENCOUNTER — Other Ambulatory Visit: Payer: Medicare Other

## 2015-06-11 ENCOUNTER — Telehealth: Payer: Self-pay | Admitting: *Deleted

## 2015-06-11 NOTE — Telephone Encounter (Signed)
Thanks Michelle

## 2015-06-11 NOTE — Telephone Encounter (Signed)
Patient not feeling well, won't be able to come in today for labs. She requests that her labs just be drawn at her appointment with Dr. Daiva Eves. Pt asked for advice for "sinus issues" and she asked for an antibiotic.  Reports no fevers, only pressure and drainage that has lasted for 1 week, that this happens a couple of times a year.  Patient is not taking any medication for allergies.  RN advised her to speak with her pharmacist to suggest an allergy medication with decongestant.  RN also advised supportive care with steamy showers, saline spray and over-the-counter ibuprofen as directed on the bottle.  Patient verbalized understanding, agreement.  She will call back if this does not help. She will follow up with Dr Daiva Eves as scheduled. Andree Coss, RN

## 2015-06-17 ENCOUNTER — Telehealth: Payer: Self-pay | Admitting: *Deleted

## 2015-06-17 NOTE — Telephone Encounter (Signed)
Pt with cough, noisy breathing, cold/sinus problems for several weeks.  Pt lives in [place].  RN advised pt to go to [facility] for evaluation.  Pt stated she will go today.

## 2015-06-25 ENCOUNTER — Encounter: Payer: Self-pay | Admitting: Infectious Disease

## 2015-06-25 ENCOUNTER — Ambulatory Visit (INDEPENDENT_AMBULATORY_CARE_PROVIDER_SITE_OTHER): Payer: Medicare Other | Admitting: Infectious Disease

## 2015-06-25 VITALS — BP 164/112 | HR 73 | Temp 98.3°F | Wt 208.0 lb

## 2015-06-25 DIAGNOSIS — Z23 Encounter for immunization: Secondary | ICD-10-CM

## 2015-06-25 DIAGNOSIS — B20 Human immunodeficiency virus [HIV] disease: Secondary | ICD-10-CM

## 2015-06-25 DIAGNOSIS — Z21 Asymptomatic human immunodeficiency virus [HIV] infection status: Secondary | ICD-10-CM

## 2015-06-25 DIAGNOSIS — G63 Polyneuropathy in diseases classified elsewhere: Secondary | ICD-10-CM

## 2015-06-25 DIAGNOSIS — I10 Essential (primary) hypertension: Secondary | ICD-10-CM

## 2015-06-25 DIAGNOSIS — G44039 Episodic paroxysmal hemicrania, not intractable: Secondary | ICD-10-CM

## 2015-06-25 HISTORY — DX: Human immunodeficiency virus (HIV) disease: B20

## 2015-06-25 LAB — CBC WITH DIFFERENTIAL/PLATELET
Basophils Absolute: 0.1 10*3/uL (ref 0.0–0.1)
Basophils Relative: 1 % (ref 0–1)
EOS ABS: 0.4 10*3/uL (ref 0.0–0.7)
EOS PCT: 5 % (ref 0–5)
HEMATOCRIT: 39.6 % (ref 36.0–46.0)
Hemoglobin: 13 g/dL (ref 12.0–15.0)
LYMPHS ABS: 3.7 10*3/uL (ref 0.7–4.0)
LYMPHS PCT: 47 % — AB (ref 12–46)
MCH: 27.8 pg (ref 26.0–34.0)
MCHC: 32.8 g/dL (ref 30.0–36.0)
MCV: 84.8 fL (ref 78.0–100.0)
MPV: 9.5 fL (ref 8.6–12.4)
Monocytes Absolute: 0.5 10*3/uL (ref 0.1–1.0)
Monocytes Relative: 6 % (ref 3–12)
NEUTROS PCT: 41 % — AB (ref 43–77)
Neutro Abs: 3.2 10*3/uL (ref 1.7–7.7)
Platelets: 471 10*3/uL — ABNORMAL HIGH (ref 150–400)
RBC: 4.67 MIL/uL (ref 3.87–5.11)
RDW: 15.5 % (ref 11.5–15.5)
WBC: 7.9 10*3/uL (ref 4.0–10.5)

## 2015-06-25 LAB — COMPLETE METABOLIC PANEL WITH GFR
ALT: 18 U/L (ref 6–29)
AST: 16 U/L (ref 10–35)
Albumin: 4.6 g/dL (ref 3.6–5.1)
Alkaline Phosphatase: 110 U/L (ref 33–115)
BILIRUBIN TOTAL: 0.3 mg/dL (ref 0.2–1.2)
BUN: 13 mg/dL (ref 7–25)
CHLORIDE: 101 mmol/L (ref 98–110)
CO2: 27 mmol/L (ref 20–31)
CREATININE: 0.9 mg/dL (ref 0.50–1.10)
Calcium: 10.1 mg/dL (ref 8.6–10.2)
GFR, Est African American: 87 mL/min (ref >=60)
GFR, Est Non African American: 75 mL/min (ref >=60)
GLUCOSE: 95 mg/dL (ref 65–99)
Potassium: 5.1 mmol/L (ref 3.5–5.3)
SODIUM: 138 mmol/L (ref 135–146)
TOTAL PROTEIN: 7.4 g/dL (ref 6.1–8.1)

## 2015-06-25 MED ORDER — GABAPENTIN 300 MG PO CAPS: 300.0000 mg | ORAL_CAPSULE | Freq: Three times a day (TID) | ORAL | Status: DC

## 2015-06-25 MED ORDER — DARUNAVIR-COBICISTAT 800-150 MG PO TABS: 1.0000 | ORAL_TABLET | Freq: Every day | ORAL | Status: DC

## 2015-06-25 MED ORDER — EMTRICITABINE-TENOFOVIR AF 200-25 MG PO TABS: 1.0000 | ORAL_TABLET | Freq: Every day | ORAL | Status: DC

## 2015-06-25 NOTE — Progress Notes (Signed)
Subjective:    Patient ID: [name], female    DOB: 03/30/1966, 49 y.o.   MRN: [id number]  HPI   40y ear old [place] American lady with HIV and AIDS who this  once again been able to perfectly suppress her viral load to <20 on prezista, norvir and truvada, in whom I changed to [place], back to P/N/T then Intelence and truvada.  I then changed her to  of intelence with Truvada. I told her she could dissolve the intelence but she dislikes the taste in apple juice or water   Finally changed to STRIBILD, but then she failed with resistanceto integrase including raltegravir and EVG but not to DTG, we changed her back to PREZCOBIX and Truvada and she now has healthy virological suppression and CD4 count.  Blood pressure is still poorly controlled and she is still not started her Topro STILL.  I reviewed the consequences of Having blood pressure controlled not limited to end-stage renal disease with requirement for hemodialysis 3 times a week, disabling stroke, massive myocardial infarction and death.  I was able to convince [name] I believe she start back on Toprol.  She is also complaining quite a bit about cramping in her legs and was stating that she could not afford the Flexeril. She was wanting me to prescribe narcotics again but I explained her exhaustively that we do not wish to prescribe narcotics for the condition that she is complaining of pain. After further discussion it became apparent that she had not been on her gabapentin for several months and that she been feeling better while on the gabapentin. 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 .     Lab Results  Component Value Date   HIV1RNAQUANT 38* 02/04/2015   Lab Results  Component Value Date   CD4TABS 550 02/04/2015   CD4TABS 570 12/03/2014   CD4TABS 550 08/30/2014      Review of Systems  Constitutional: Negative for fever, chills, diaphoresis, activity  change, appetite change, fatigue and unexpected weight change.  HENT: Negative for congestion, rhinorrhea, sinus pressure, sneezing, sore throat and trouble swallowing.   Eyes: Negative for photophobia and visual disturbance.  Respiratory: Negative for cough, chest tightness, shortness of breath, wheezing and stridor.   Cardiovascular: Negative for chest pain, palpitations and leg swelling.  Gastrointestinal: Negative for nausea, vomiting, abdominal pain, diarrhea, constipation, blood in stool, abdominal distention and anal bleeding.  Genitourinary: Negative for dysuria, hematuria, flank pain and difficulty urinating.  Musculoskeletal: Positive for myalgias. Negative for back pain, joint swelling, arthralgias and gait problem.  Skin: Negative for color change, pallor, rash and wound.  Neurological: Positive for headaches. Negative for dizziness, tremors, weakness and light-headedness.  Hematological: Negative for adenopathy. Does not bruise/bleed easily.  Psychiatric/Behavioral: Negative for behavioral problems, confusion, sleep disturbance, dysphoric mood, decreased concentration and agitation.       Objective:   Physical Exam  Constitutional: She is oriented to person, place, and time. She appears well-developed and well-nourished. No distress.  HENT:  Head: Normocephalic and atraumatic.  Mouth/Throat: Oropharynx is clear and moist. No oropharyngeal exudate.  Eyes: Conjunctivae and EOM are normal. Pupils are equal, round, and reactive to light. No scleral icterus.  Neck: Normal range of motion. Neck supple. No JVD present.  Cardiovascular: Normal rate, regular rhythm and normal heart sounds.   Pulmonary/Chest: Effort normal and breath sounds normal. No respiratory distress. She has no wheezes.  Abdominal: She exhibits no distension. There is no tenderness.  Musculoskeletal: She exhibits no edema or tenderness.  Lymphadenopathy:    She has no cervical adenopathy.  Neurological: She is  alert and oriented to person, place, and time. She has normal reflexes. She exhibits normal muscle tone. Coordination normal.  Skin: Skin is warm and dry. She is not diaphoretic. No erythema. No pallor.  Psychiatric: She has a normal mood and affect. Her behavior is normal. Judgment and thought content normal.  Nursing note and vitals reviewed.         Assessment & Plan:  HIV continue PREZCOBIX and start DESCOVY in place of Truvada   Headaches:  Being followed by Neurology   HTN: she needs to start her Toprol  and I think she more fully understands the constant lenses of not doing so.  Morbid obesity: She has lost weight since I saw her last and can commend her on this.  HIV neuropathy: Restart gabapentin  I spent greater than 40 minutes with the patient including greater than 50% of time in face to face counsel of the patient regarding her new HIV regimen treatment of her HIV review of all of her labs with her, review of nature of HIV associated neuropathy appropriate treatment of this, dangers of narcotics review of the pathophysiology of hypertension and the need for her to be on antihypertensives and medications to avoid on one of consequences such as being on hemodialysis having a stroke or having a myocardial infarction. and in coordination of their care.

## 2015-06-25 NOTE — Patient Instructions (Signed)
IF your insurance will cover this I am switching you to  PREZCOBIX  And   DESCOVY (mini Truvada safer on bone and kidney)  STARt the metoprolol  Restart the gabapenting  RTC in 10 weeks

## 2015-06-26 LAB — MICROALBUMIN / CREATININE URINE RATIO
Creatinine, Urine: 97.7 mg/dL
Microalb Creat Ratio: 4.1 mg/g (ref 0.0–30.0)
Microalb, Ur: 0.4 mg/dL (ref <2.0)

## 2015-06-26 LAB — HIV-1 RNA ULTRAQUANT REFLEX TO GENTYP+: HIV-1 RNA Quant, Log: <1.3 {Log} (ref <1.30)

## 2015-06-26 LAB — RPR

## 2015-06-27 LAB — T-HELPER CELL (CD4) - (RCID CLINIC ONLY)
CD4 T CELL HELPER: 14 % — AB (ref 33–55)
CD4 T Cell Abs: 530 /uL (ref 400–2700)

## 2015-07-14 ENCOUNTER — Other Ambulatory Visit: Payer: Self-pay | Admitting: *Deleted

## 2015-07-14 DIAGNOSIS — K219 Gastro-esophageal reflux disease without esophagitis: Secondary | ICD-10-CM

## 2015-07-14 MED ORDER — OMEPRAZOLE 20 MG PO CPDR: 20.0000 mg | DELAYED_RELEASE_CAPSULE | Freq: Two times a day (BID) | ORAL | Status: DC

## 2015-09-03 ENCOUNTER — Encounter: Payer: Self-pay | Admitting: Infectious Disease

## 2015-09-03 ENCOUNTER — Ambulatory Visit (INDEPENDENT_AMBULATORY_CARE_PROVIDER_SITE_OTHER): Payer: Medicare Other | Admitting: Infectious Disease

## 2015-09-03 VITALS — BP 145/88 | HR 66 | Temp 98.1°F | Wt 210.0 lb

## 2015-09-03 DIAGNOSIS — I10 Essential (primary) hypertension: Secondary | ICD-10-CM

## 2015-09-03 DIAGNOSIS — G43009 Migraine without aura, not intractable, without status migrainosus: Secondary | ICD-10-CM

## 2015-09-03 DIAGNOSIS — B20 Human immunodeficiency virus [HIV] disease: Secondary | ICD-10-CM

## 2015-09-03 MED ORDER — LISINOPRIL 10 MG PO TABS: 10.0000 mg | ORAL_TABLET | Freq: Every day | ORAL | Status: DC

## 2015-09-03 MED ORDER — METOPROLOL SUCCINATE ER 50 MG PO TB24: 50.0000 mg | ORAL_TABLET | Freq: Every day | ORAL | Status: DC

## 2015-09-03 MED ORDER — HYDROCHLOROTHIAZIDE 25 MG PO TABS: 25.0000 mg | ORAL_TABLET | Freq: Every day | ORAL | Status: DC

## 2015-09-03 NOTE — Progress Notes (Signed)
Chief complaint: followup  Subjective:    Patient ID: [name], female    DOB: 10-Jul-1966, 49 y.o.   MRN: [id number]  HPI   49 year old [place] lady with HIV and AIDS who this  once again been able to perfectly suppress her viral load to <20 on prezista, norvir and truvada, in whom I changed to [place] and truvada, back to P/N/T then Intelence and truvada.  I then changed her to 400mg  of intelence with Truvada. I told her she could dissolve the intelence but she dislikes the taste in apple juice or water   Finally changed to STRIBILD, but then she failed with resistanceto integrase including raltegravir and EVG but not to DTG, we changed her back to PREZCOBIX and Truvada and she now has healthy virological suppression and CD4 count.  Blood pressure was poorly controlled and she is still not started her Topro XL.  She is now changed to Prezcobix and Descovy. .   Lab Results  Component Value Date   HIV1RNAQUANT <20 06/25/2015   Lab Results  Component Value Date   CD4TABS 530 06/25/2015   CD4TABS 550 02/04/2015   CD4TABS 570 12/03/2014      Review of Systems  Constitutional: Negative for fever, chills, diaphoresis, activity change, appetite change, fatigue and unexpected weight change.  HENT: Negative for congestion, rhinorrhea, sinus pressure, sneezing, sore throat and trouble swallowing.   Eyes: Negative for photophobia and visual disturbance.  Respiratory: Negative for cough, chest tightness, shortness of breath, wheezing and stridor.   Cardiovascular: Negative for chest pain, palpitations and leg swelling.  Gastrointestinal: Negative for nausea, vomiting, abdominal pain, diarrhea, constipation, blood in stool, abdominal distention and anal bleeding.  Genitourinary: Negative for dysuria, hematuria, flank pain and difficulty urinating.  Musculoskeletal: Negative for back pain, joint swelling, arthralgias and gait problem.  Skin: Negative for color change,  pallor, rash and wound.  Neurological: Negative for dizziness, tremors, weakness and light-headedness.  Hematological: Negative for adenopathy. Does not bruise/bleed easily.  Psychiatric/Behavioral: Negative for behavioral problems, confusion, sleep disturbance, dysphoric mood, decreased concentration and agitation.       Objective:   Physical Exam  Constitutional: She is oriented to person, place, and time. She appears well-developed and well-nourished. No distress.  HENT:  Head: Normocephalic and atraumatic.  Mouth/Throat: Oropharynx is clear and moist. No oropharyngeal exudate.  Eyes: Conjunctivae and EOM are normal. Pupils are equal, round, and reactive to light. No scleral icterus.  Neck: Normal range of motion. Neck supple. No JVD present.  Cardiovascular: Normal rate, regular rhythm and normal heart sounds.   Pulmonary/Chest: Effort normal and breath sounds normal. No respiratory distress. She has no wheezes.  Abdominal: She exhibits no distension. There is no tenderness.  Musculoskeletal: She exhibits no edema or tenderness.  Lymphadenopathy:    She has no cervical adenopathy.  Neurological: She is alert and oriented to person, place, and time. She has normal reflexes. She exhibits normal muscle tone. Coordination normal.  Skin: Skin is warm and dry. She is not diaphoretic. No erythema. No pallor.  Psychiatric: She has a normal mood and affect. Her behavior is normal. Judgment and thought content normal.  Nursing note and vitals reviewed.         Assessment & Plan:  HIV continue PREZCOBIX and start DESCOVY   Headaches:  Being followed by Neurology   HTN: continue Toprol and add HCTZ  Morbid obesity: She has lost weight since I saw her last and can commend her on this.  HIV neuropathy: continue gabapentin  I spent greater than 40 minutes with the patient including greater than 50% of time in face to face counsel of the patient regarding her new HIV regimen treatment  of her HIV review of all of her labs , HTN, neuropathy and in coordination of her care.

## 2015-09-03 NOTE — Patient Instructions (Addendum)
[name] your Blood pressurelooks much better  Continue taking the Toprol Xl 50mg   And we added 25 mg of HCTZ   Make an appt with RN for BP check in next 2-4 weeks

## 2015-09-04 NOTE — Progress Notes (Signed)
HPI: [name] is a 49 y.o. female who presents for HIV follow-up.  Allergies: Allergies  Allergen Reactions  . Morphine Other (See Comments)    Burning under the skin  . Doxycycline Hyclate Rash    Vitals:  BP: 145/88 mmHg HR: 66 bpm  Past Medical History: Past Medical History  Diagnosis Date  . HIV infection (HCC)   . Shingles     2000  . Anemia   . Hypertension     not on medication   . GERD (gastroesophageal reflux disease)   . Headache     hx of migraines   . Myocardial infarction (HCC)     mild MI 09/2004     no heart problems since  . 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 Marital Status: Single    Spouse Name: N/A  . Number of Children: N/A  . Years of Education: HS   Occupational History  . disabled    Social History Main Topics  . Smoking status: Former Smoker    Types: Cigarettes  . Smokeless tobacco: Never Used  . Alcohol Use: Yes     Comment: rare red wine   . Drug Use: 14.00 per week    Special: Marijuana     Comment: hx of cocaine use (stopped 2008)  . Sexual Activity: Not Currently     Comment: pt. declined condoms   Other Topics Concern  . None   Social History Narrative    Labs: HIV 1 RNA QUANT (copies/mL)  Date Value  06/25/2015 <20  02/04/2015 38*  12/18/2014 862*   CD4 T CELL ABS (/uL)  Date Value  06/25/2015 530  02/04/2015 550  12/03/2014 570   HEP B S AB (no units)  Date Value  04/22/2008 POS*   HEPATITIS B SURFACE AG (no units)  Date Value  12/26/2006 NO   HCV AB (no units)  Date Value  12/26/2006 NO    CrCl: CrCl cannot be calculated (Unknown ideal weight.).  Lipids:    Component Value Date/Time   CHOL 223* 12/03/2014 1100   TRIG 191* 12/03/2014 1100   HDL 46 12/03/2014 1100   CHOLHDL 4.8 12/03/2014 1100   VLDL 38 12/03/2014 1100   LDLCALC 139* 12/03/2014 1100     Assessment: Ms. [name] is a 49yo F who presents for HIV follow-up. She has continued to do well on Prezcobix and Descovy without any complaints. Today in clinic her blood pressure continues to be slightly elevated. Her blood pressure medications are currently managed here by Dr. Daiva EvesVan Dam. He recommended patient seek out a PCP but she does not want to see any other physicians.  Recommendations: - Continue Prezcobix and Descovy - Continue Toprol XL 50 mg daily - Add HCTZ 25 mg daily  Ancil Boozeraylor P Everrett Lacasse, PharmD PGY-1 Pharmacy Resident  09/04/2015, 11:35 AM

## 2015-09-05 ENCOUNTER — Emergency Department (HOSPITAL_BASED_OUTPATIENT_CLINIC_OR_DEPARTMENT_OTHER)
Admission: EM | Admit: 2015-09-05 | Discharge: 2015-09-05 | Disposition: A | Discharge status: Home / Self Care | Payer: Medicare Other | Attending: Emergency Medicine | Admitting: Emergency Medicine

## 2015-09-05 ENCOUNTER — Telehealth: Payer: Self-pay | Admitting: *Deleted

## 2015-09-05 ENCOUNTER — Encounter (HOSPITAL_BASED_OUTPATIENT_CLINIC_OR_DEPARTMENT_OTHER): Payer: Self-pay | Admitting: Emergency Medicine

## 2015-09-05 DIAGNOSIS — R109 Unspecified abdominal pain: Secondary | ICD-10-CM | POA: Diagnosis not present

## 2015-09-05 DIAGNOSIS — R112 Nausea with vomiting, unspecified: Secondary | ICD-10-CM

## 2015-09-05 DIAGNOSIS — B20 Human immunodeficiency virus [HIV] disease: Secondary | ICD-10-CM | POA: Diagnosis not present

## 2015-09-05 DIAGNOSIS — I252 Old myocardial infarction: Secondary | ICD-10-CM | POA: Insufficient documentation

## 2015-09-05 DIAGNOSIS — Z79899 Other long term (current) drug therapy: Secondary | ICD-10-CM | POA: Diagnosis not present

## 2015-09-05 DIAGNOSIS — Z87891 Personal history of nicotine dependence: Secondary | ICD-10-CM | POA: Insufficient documentation

## 2015-09-05 DIAGNOSIS — K219 Gastro-esophageal reflux disease without esophagitis: Secondary | ICD-10-CM | POA: Insufficient documentation

## 2015-09-05 DIAGNOSIS — Z862 Personal history of diseases of the blood and blood-forming organs and certain disorders involving the immune mechanism: Secondary | ICD-10-CM | POA: Insufficient documentation

## 2015-09-05 DIAGNOSIS — I10 Essential (primary) hypertension: Secondary | ICD-10-CM | POA: Insufficient documentation

## 2015-09-05 HISTORY — DX: Human immunodeficiency virus (HIV) disease: B20

## 2015-09-05 LAB — COMPREHENSIVE METABOLIC PANEL
ALBUMIN: 4.6 g/dL (ref 3.5–5.0)
ALT: 39 U/L (ref 14–54)
AST: 30 U/L (ref 15–41)
Alkaline Phosphatase: 135 U/L — ABNORMAL HIGH (ref 38–126)
Anion gap: 12 (ref 5–15)
BUN: 11 mg/dL (ref 6–20)
CHLORIDE: 101 mmol/L (ref 101–111)
CO2: 26 mmol/L (ref 22–32)
CREATININE: 1.06 mg/dL — AB (ref 0.44–1.00)
Calcium: 9.6 mg/dL (ref 8.9–10.3)
GFR calc Af Amer: >60 mL/min (ref >60)
GLUCOSE: 131 mg/dL — AB (ref 65–99)
POTASSIUM: 3.7 mmol/L (ref 3.5–5.1)
Sodium: 139 mmol/L (ref 135–145)
Total Bilirubin: 0.5 mg/dL (ref 0.3–1.2)
Total Protein: 8.8 g/dL — ABNORMAL HIGH (ref 6.5–8.1)

## 2015-09-05 LAB — CBC WITH DIFFERENTIAL/PLATELET
BASOS ABS: 0 10*3/uL (ref 0.0–0.1)
BASOS PCT: 0 %
EOS PCT: 2 %
Eosinophils Absolute: 0.2 10*3/uL (ref 0.0–0.7)
HEMATOCRIT: 42 % (ref 36.0–46.0)
Hemoglobin: 13.8 g/dL (ref 12.0–15.0)
LYMPHS PCT: 22 %
Lymphs Abs: 2.2 10*3/uL (ref 0.7–4.0)
MCH: 27.6 pg (ref 26.0–34.0)
MCHC: 32.9 g/dL (ref 30.0–36.0)
MCV: 84 fL (ref 78.0–100.0)
MONO ABS: 0.4 10*3/uL (ref 0.1–1.0)
Monocytes Relative: 4 %
NEUTROS ABS: 7.3 10*3/uL (ref 1.7–7.7)
Neutrophils Relative %: 72 %
PLATELETS: 462 10*3/uL — AB (ref 150–400)
RBC: 5 MIL/uL (ref 3.87–5.11)
RDW: 14 % (ref 11.5–15.5)
WBC: 10.1 10*3/uL (ref 4.0–10.5)

## 2015-09-05 MED ORDER — ONDANSETRON HCL 4 MG/2ML IJ SOLN
4.0000 mg | Freq: Once | INTRAMUSCULAR | Status: AC
  Administered 2015-09-05: 4 mg via INTRAVENOUS
  Filled 2015-09-05: qty 2

## 2015-09-05 MED ORDER — HYDROCODONE-ACETAMINOPHEN 7.5-325 MG/15ML PO SOLN
10.0000 mL | Freq: Once | ORAL | Status: AC
  Administered 2015-09-05: 10 mL via ORAL
  Filled 2015-09-05: qty 15

## 2015-09-05 MED ORDER — SODIUM CHLORIDE 0.9 % IV BOLUS (SEPSIS)
1000.0000 mL | Freq: Once | INTRAVENOUS | Status: AC
  Administered 2015-09-05: 1000 mL via INTRAVENOUS

## 2015-09-05 NOTE — ED Notes (Signed)
Patient tolerated food and fluids well

## 2015-09-05 NOTE — Discharge Instructions (Signed)
Discontinue the Descovy.  Re-start your Truvada as previously directed and continue the Prezcobix. Follow-up with Dr. Daiva EvesVan Dam regarding medication intolerance. Take zofran as needed for any nausea/vomiting. Return here for new concerns.

## 2015-09-05 NOTE — ED Notes (Signed)
Patient stable and ambulatory.  Patient verbalizes understanding of discharge instructions. 

## 2015-09-05 NOTE — Telephone Encounter (Signed)
Patient called crying and advised she started her Descovy last night and has been up vomiting all night she has not slept and feels very bad. She wants to go back to her truvada. She advised she is not taking anymore Descovy . Advised will let the doctor know and call her back if he has anything to add. The patient has more Truvada on hand. She also is afraid to start the new BP medication as well due to the reaction to the Descovy.

## 2015-09-05 NOTE — ED Notes (Signed)
Patient states she has HIV and is taken the same cocktail for 10 years, but her doctor put her on another medication.  She started taking it last night, only one and this morning around 7am she started having severe pain in URQ or ULQ.  She states the pain will move with each episode, but they last a few minutes then she has no pain.  She states the nausea starts, pain and the then the vomiting.

## 2015-09-05 NOTE — ED Provider Notes (Signed)
CSN: [id number]     Arrival date & time 09/05/15  1615 History   First MD Initiated Contact with Patient 09/05/15 1617     Chief Complaint  Patient presents with  . Emesis    new medication last night     (Consider location/radiation/quality/duration/timing/severity/associated sxs/prior Treatment) Patient is a 49 y.o. female presenting with vomiting. The history is provided by the patient and medical records.  Emesis  49 year old female with history of HIV, anemia, hypertension, GERD, neuropathy, presenting to the ED for nausea and vomiting. Per patient, she is followed by infectious disease, Dr. Zenaida Niece dam. She states she has been on nearly the same combination of medications for approximately 10 years without difficulty. She states she was told she was changed due to potential of bone demineralization with long-term use of some of her meds. She states she was started on Prezcobix and Descovy-- she took 1 dose last night and 1 this morning and began having severe nausea/vomiting.  She states she has vomited more than 15x already today, all episodes were nonbloody and nonbilious.  She denies any fever or chills.  She does have some cramping abdominal pain which she reports worsens upon retching. No diarrhea. No chest pain or shortness of breath.  Vital signs stable.   Past Medical History  Diagnosis Date  . HIV infection (HCC)   . Shingles     2000  . Anemia   . Hypertension     not on medication   . GERD (gastroesophageal reflux disease)   . Headache     hx of migraines   . Myocardial infarction (HCC)     mild MI 09/2004     no heart problems since  . 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 HIV disease [facility])    Past Surgical History  Procedure Laterality Date  . Cesarean section    . Abdominal hysterectomy  1993    Partial  . Tonsillectomy  1988  . Laparoscopic assisted ventral hernia repair N/A 01/02/2015   Procedure: LAPAROSCOPIC VENTRAL WALL HERNIA REPAIR;  Surgeon: Karie Soda, MD;  Location: [facility];  Service: General;  Laterality: N/A;  With MESH   Family History  Problem Relation Age of Onset  . Diabetes type II Brother   . Migraines Neg Hx    Social History  Substance Use Topics  . Smoking status: Former Smoker    Types: Cigarettes  . Smokeless tobacco: Never Used  . Alcohol Use: Yes     Comment: rare red wine    OB History    No data available     Review of Systems  Gastrointestinal: Positive for nausea and vomiting.  All other systems reviewed and are negative.     Allergies  Morphine and Doxycycline hyclate  Home Medications   Prior to Admission medications   Medication Sig Start Date End Date Taking? Authorizing Provider  Chlorphen-Phenyleph-ASA (ALKA-SELTZER PLUS COLD PO) Take 1 each by mouth at bedtime as needed (cold).   Yes Historical Provider, MD  darunavir-cobicistat (PREZCOBIX) 800-150 MG per tablet Take 1 tablet by mouth daily. Swallow whole. Do NOT crush, break or chew tablets. Take with food. 06/25/15  Yes Randall Hiss, MD  emtricitabine-tenofovir AF (DESCOVY) 200-25 MG per tablet Take 1 tablet by mouth daily. 06/25/15  Yes Randall Hiss, MD  gabapentin (NEURONTIN) 300 MG capsule Take 1 capsule (300 mg total) by mouth 3 (three)  times daily. 06/25/15  Yes Randall Hissornelius N Van Dam, MD  hydrochlorothiazide (HYDRODIURIL) 25 MG tablet Take 1 tablet (25 mg total) by mouth daily. 09/03/15  Yes Randall Hissornelius N Van Dam, MD  metoprolol succinate (TOPROL-XL) 50 MG 24 hr tablet Take 1 tablet (50 mg total) by mouth at bedtime. 09/03/15  Yes Randall Hissornelius N Van Dam, MD  omeprazole (PRILOSEC) 20 MG capsule Take 1 capsule (20 mg total) by mouth 2 (two) times daily. 07/14/15  Yes Randall Hissornelius N Van Dam, MD  valACYclovir (VALTREX) 1000 MG tablet TAKE 1 TABLET BY MOUTH EVERY DAY 04/25/15  Yes Randall Hissornelius N Van Dam, MD   BP 166/115 mmHg  Pulse 79  Temp(Src) 98.1 F (36.7 C) (Oral)   Resp 20  Ht 5\' 4"  (1.626 m)  Wt 210 lb (95.255 kg)  BMI 36.03 kg/m2  SpO2 100%   Physical Exam  Constitutional: She is oriented to person, place, and time. She appears well-developed and well-nourished. No distress.  HENT:  Head: Normocephalic and atraumatic.  Mouth/Throat: Oropharynx is clear and moist.  Eyes: Conjunctivae and EOM are normal. Pupils are equal, round, and reactive to light.  Neck: Normal range of motion. Neck supple.  Cardiovascular: Normal rate, regular rhythm and normal heart sounds.   Pulmonary/Chest: Effort normal and breath sounds normal. No respiratory distress. She has no wheezes.  Abdominal: Soft. Bowel sounds are normal. There is no tenderness. There is no guarding.  Abdomen soft, non-tender, normal bowel sounds; no peritonitis  Musculoskeletal: Normal range of motion. She exhibits no edema.  Neurological: She is alert and oriented to person, place, and time.  Skin: Skin is warm and dry. She is not diaphoretic.  Psychiatric: She has a normal mood and affect.  Nursing note and vitals reviewed.   ED Course  Procedures (including critical care time) Labs Review Labs Reviewed  CBC WITH DIFFERENTIAL/PLATELET - Abnormal; Notable for the following:    Platelets 462 (*)    All other components within normal limits  COMPREHENSIVE METABOLIC PANEL - Abnormal; Notable for the following:    Glucose, Bld 131 (*)    Creatinine, Ser 1.06 (*)    Total Protein 8.8 (*)    Alkaline Phosphatase 135 (*)    All other components within normal limits    Imaging Review No results found. I have personally reviewed and evaluated these images and lab results as part of my medical decision-making.   EKG Interpretation None      MDM   Final diagnoses:  Nausea and vomiting, vomiting of unspecified type   49 year old female here with nausea and vomiting. Her HIV medications were changed 2 days ago, now currently on Prezcobix and Descovy.  Per chart review, it appears she  has taken Prezcobix in the past in combination with her Truvada, however Descovy is entirely new.  There are noted side effects of severe N/V with Descovy per drug database.  This seems to be the culprit of her symptoms.  Patient remains afebrile, non-toxic.  Abdominal exam is benign.  Lab work is reassuring without significant electrolyte imbalance. After IV fluids and Zofran, patient states she is feeling better.  Will attempt PO challenge.  Patient tolerated PO food and fluids without difficulty.  Given severity of her N/V, will have patient discontinue the Descovy and re-start her Truvada in place of that, continue Prezcobix (which was prior regimen) so that she is not without HIV treatment.  Will have her follow-up Dr. Daiva EvesVan Dam and discuss medication intolerance.  Rx zofran for  home.  Discussed plan with patient, he/she acknowledged understanding and agreed with plan of care.  Return precautions given for new or worsening symptoms.  Garlon Hatchet, PA-C 09/05/15 2044  Linwood Dibbles, MD 09/09/15 [phone]

## 2015-09-05 NOTE — ED Notes (Signed)
Patient given crackers and gingerale. Tolerated well.

## 2015-10-08 ENCOUNTER — Ambulatory Visit: Payer: Medicare Other | Admitting: Infectious Disease

## 2016-01-07 ENCOUNTER — Encounter (HOSPITAL_BASED_OUTPATIENT_CLINIC_OR_DEPARTMENT_OTHER): Payer: Self-pay

## 2016-01-07 ENCOUNTER — Emergency Department (HOSPITAL_BASED_OUTPATIENT_CLINIC_OR_DEPARTMENT_OTHER): Payer: Medicare Other

## 2016-01-07 ENCOUNTER — Emergency Department (HOSPITAL_BASED_OUTPATIENT_CLINIC_OR_DEPARTMENT_OTHER)
Admission: EM | Admit: 2016-01-07 | Discharge: 2016-01-07 | Disposition: A | Discharge status: Home / Self Care | Payer: Medicare Other | Attending: Emergency Medicine | Admitting: Emergency Medicine

## 2016-01-07 DIAGNOSIS — G629 Polyneuropathy, unspecified: Secondary | ICD-10-CM | POA: Diagnosis not present

## 2016-01-07 DIAGNOSIS — M791 Myalgia, unspecified site: Secondary | ICD-10-CM

## 2016-01-07 DIAGNOSIS — R109 Unspecified abdominal pain: Secondary | ICD-10-CM | POA: Insufficient documentation

## 2016-01-07 DIAGNOSIS — B20 Human immunodeficiency virus [HIV] disease: Secondary | ICD-10-CM | POA: Diagnosis not present

## 2016-01-07 DIAGNOSIS — J3489 Other specified disorders of nose and nasal sinuses: Secondary | ICD-10-CM | POA: Diagnosis not present

## 2016-01-07 DIAGNOSIS — K219 Gastro-esophageal reflux disease without esophagitis: Secondary | ICD-10-CM | POA: Diagnosis not present

## 2016-01-07 DIAGNOSIS — Z79899 Other long term (current) drug therapy: Secondary | ICD-10-CM | POA: Diagnosis not present

## 2016-01-07 DIAGNOSIS — R0981 Nasal congestion: Secondary | ICD-10-CM

## 2016-01-07 DIAGNOSIS — I252 Old myocardial infarction: Secondary | ICD-10-CM | POA: Insufficient documentation

## 2016-01-07 DIAGNOSIS — Z862 Personal history of diseases of the blood and blood-forming organs and certain disorders involving the immune mechanism: Secondary | ICD-10-CM | POA: Insufficient documentation

## 2016-01-07 DIAGNOSIS — M79606 Pain in leg, unspecified: Secondary | ICD-10-CM | POA: Diagnosis present

## 2016-01-07 DIAGNOSIS — Z87891 Personal history of nicotine dependence: Secondary | ICD-10-CM | POA: Insufficient documentation

## 2016-01-07 DIAGNOSIS — I10 Essential (primary) hypertension: Secondary | ICD-10-CM | POA: Insufficient documentation

## 2016-01-07 HISTORY — DX: Cocaine abuse, in remission: F14.11

## 2016-01-07 LAB — CBC WITH DIFFERENTIAL/PLATELET
BASOS PCT: 1 %
Basophils Absolute: 0.1 10*3/uL (ref 0.0–0.1)
EOS ABS: 0.5 10*3/uL (ref 0.0–0.7)
EOS PCT: 5 %
HCT: 38.4 % (ref 36.0–46.0)
HEMOGLOBIN: 13 g/dL (ref 12.0–15.0)
LYMPHS ABS: 2.6 10*3/uL (ref 0.7–4.0)
Lymphocytes Relative: 25 %
MCH: 28.6 pg (ref 26.0–34.0)
MCHC: 33.9 g/dL (ref 30.0–36.0)
MCV: 84.4 fL (ref 78.0–100.0)
MONO ABS: 1 10*3/uL (ref 0.1–1.0)
MONOS PCT: 9 %
NEUTROS PCT: 60 %
Neutro Abs: 6.4 10*3/uL (ref 1.7–7.7)
PLATELETS: 399 10*3/uL (ref 150–400)
RBC: 4.55 MIL/uL (ref 3.87–5.11)
RDW: 14.1 % (ref 11.5–15.5)
WBC: 10.5 10*3/uL (ref 4.0–10.5)

## 2016-01-07 LAB — LIPASE, BLOOD: LIPASE: 57 U/L — AB (ref 11–51)

## 2016-01-07 LAB — URINALYSIS, ROUTINE W REFLEX MICROSCOPIC
Bilirubin Urine: NEGATIVE
Glucose, UA: NEGATIVE mg/dL
Hgb urine dipstick: NEGATIVE
Ketones, ur: NEGATIVE mg/dL
Leukocytes, UA: NEGATIVE
NITRITE: NEGATIVE
PH: 5 (ref 5.0–8.0)
Protein, ur: NEGATIVE mg/dL
SPECIFIC GRAVITY, URINE: 1.015 (ref 1.005–1.030)

## 2016-01-07 LAB — COMPREHENSIVE METABOLIC PANEL
ALBUMIN: 4.3 g/dL (ref 3.5–5.0)
ALK PHOS: 107 U/L (ref 38–126)
ALT: 40 U/L (ref 14–54)
ANION GAP: 12 (ref 5–15)
AST: 28 U/L (ref 15–41)
BUN: 15 mg/dL (ref 6–20)
CALCIUM: 9.3 mg/dL (ref 8.9–10.3)
CO2: 25 mmol/L (ref 22–32)
Chloride: 101 mmol/L (ref 101–111)
Creatinine, Ser: 0.87 mg/dL (ref 0.44–1.00)
GFR calc non Af Amer: >60 mL/min (ref >60)
GLUCOSE: 100 mg/dL — AB (ref 65–99)
POTASSIUM: 3.6 mmol/L (ref 3.5–5.1)
SODIUM: 138 mmol/L (ref 135–145)
TOTAL PROTEIN: 7.9 g/dL (ref 6.5–8.1)
Total Bilirubin: 0.5 mg/dL (ref 0.3–1.2)

## 2016-01-07 LAB — CK: Total CK: 291 U/L — ABNORMAL HIGH (ref 38–234)

## 2016-01-07 IMAGING — CR DG ABDOMEN ACUTE W/ 1V CHEST
4 series · 4 of 4 positions shown · non-contrast
Comparison: Chest radiographs [DATE], abdominal radiographs
[DATE]

CLINICAL DATA: Severe leg cramping, lower abdominal cramping and
nausea since earlier today, history HIV, MI, hypertension, GERD

EXAM:
DG ABDOMEN ACUTE W/ 1V CHEST

[w chest pa]
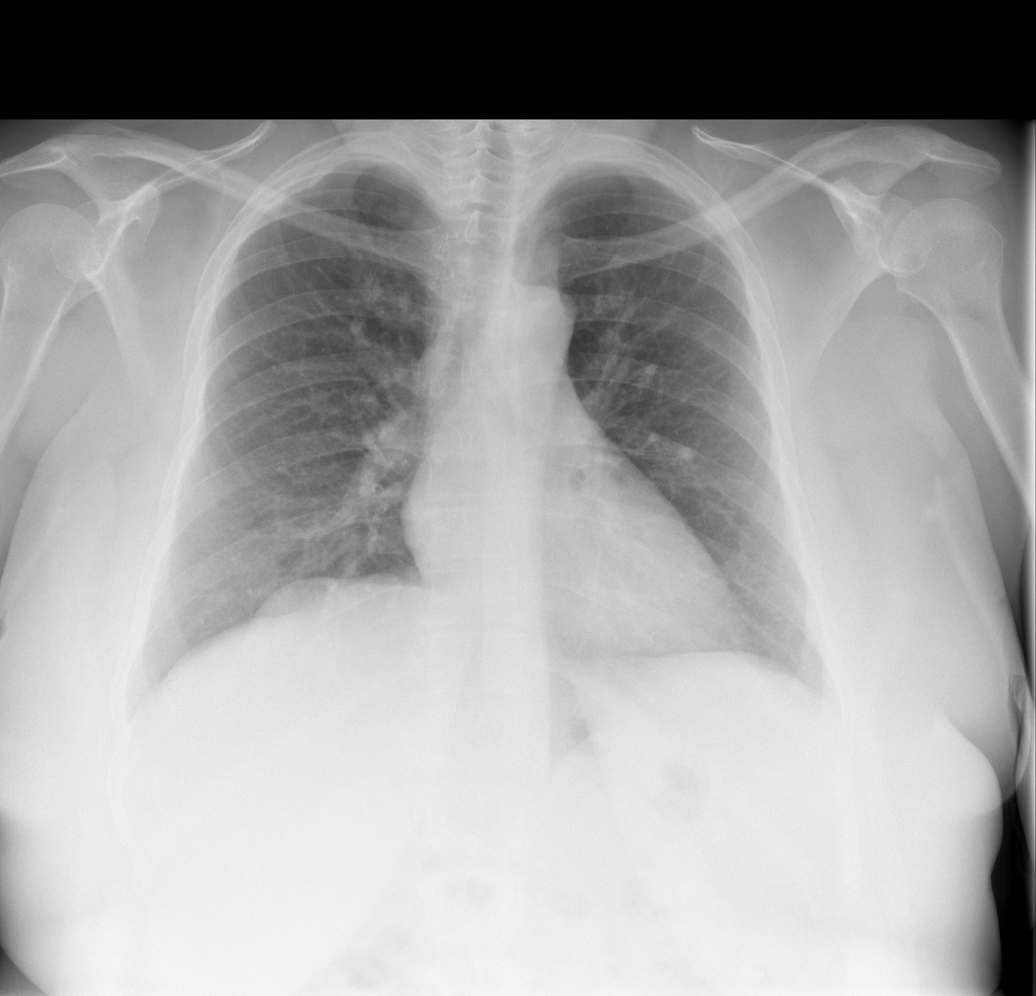

[w abdomen upright *]
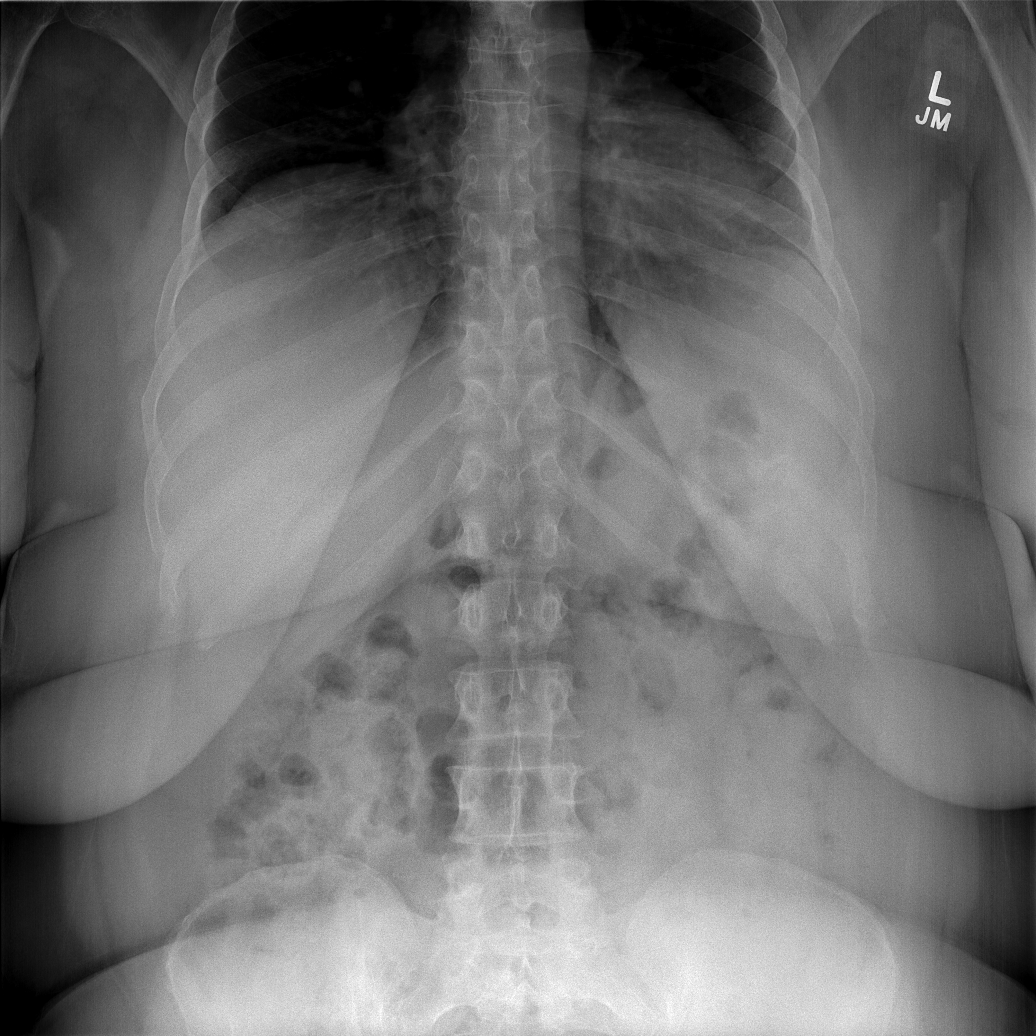

[t abdomen supine (1 of 2)]
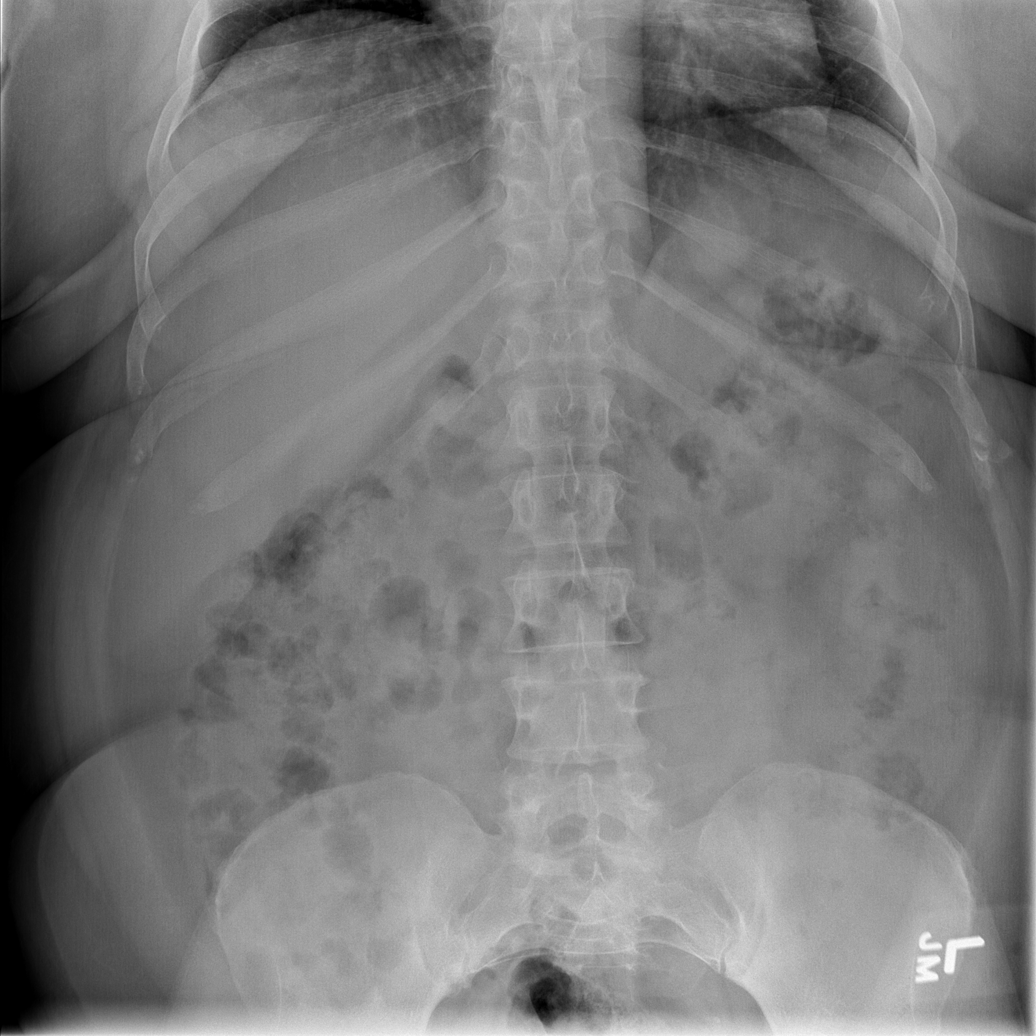

[t abdomen supine (2 of 2)]
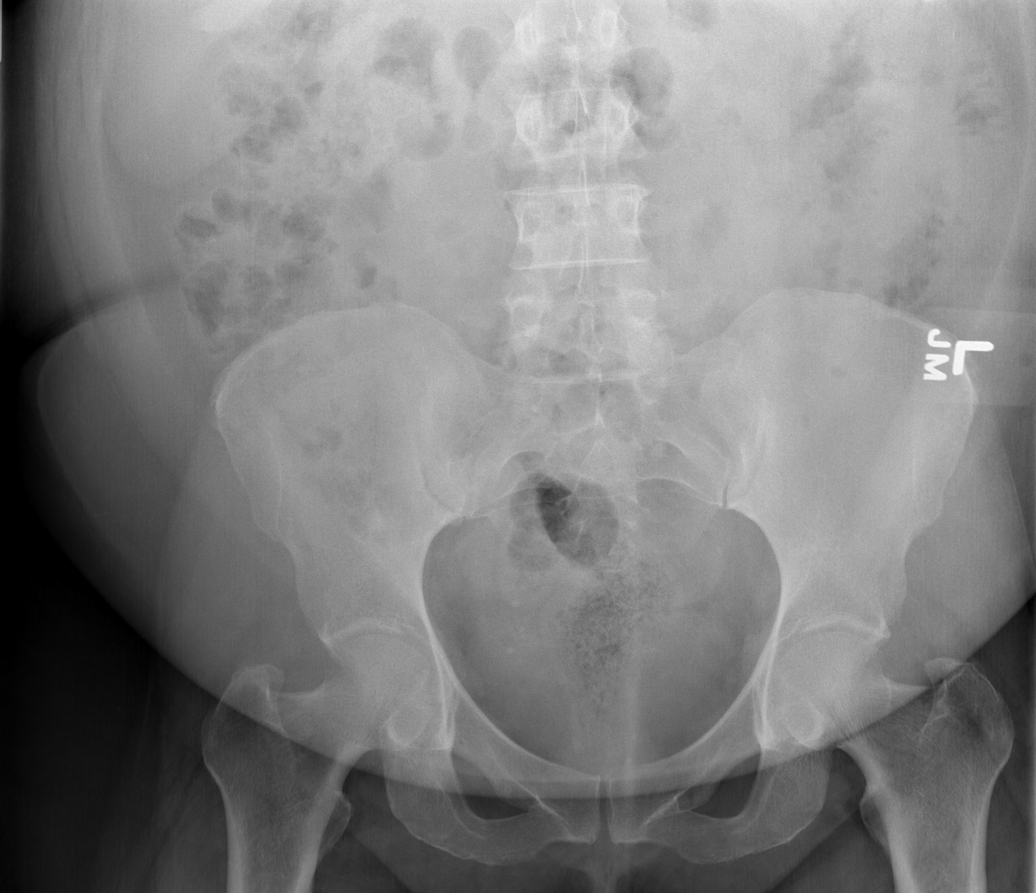

[4 of 4 positions shown; findings below may reference images not displayed]

FINDINGS: Normal heart size, mediastinal contours, and pulmonary vascularity.

Bronchitic changes without infiltrate, pleural effusion or
pneumothorax.

Normal bowel gas pattern.

No bowel dilatation, bowel wall thickening, or free intraperitoneal
air.

Osseous structures intact.

Pelvic phleboliths again noted.

No definite urinary tract calcification.
IMPRESSION: No acute abdominal findings.

Minimal bronchitic changes.

## 2016-01-07 MED ORDER — HYDROMORPHONE HCL 1 MG/ML IJ SOLN
1.0000 mg | Freq: Once | INTRAMUSCULAR | Status: AC
Start: 2016-01-07 — End: 2016-01-07
  Administered 2016-01-07: 1 mg via INTRAVENOUS
  Filled 2016-01-07: qty 1

## 2016-01-07 MED ORDER — FLUTICASONE PROPIONATE 50 MCG/ACT NA SUSP: 2.0000 | Freq: Every day | NASAL | Status: DC

## 2016-01-07 MED ORDER — PROMETHAZINE HCL 25 MG PO TABS
25.0000 mg | ORAL_TABLET | Freq: Once | ORAL | Status: AC
  Administered 2016-01-07: 25 mg via ORAL
  Filled 2016-01-07: qty 1

## 2016-01-07 MED ORDER — SODIUM CHLORIDE 0.9 % IV BOLUS (SEPSIS)
1000.0000 mL | Freq: Once | INTRAVENOUS | Status: AC
  Administered 2016-01-07: 1000 mL via INTRAVENOUS

## 2016-01-07 MED ORDER — ONDANSETRON 8 MG PO TBDP
8.0000 mg | ORAL_TABLET | Freq: Once | ORAL | Status: AC | PRN
  Administered 2016-01-07: 8 mg via ORAL
  Filled 2016-01-07: qty 1

## 2016-01-07 NOTE — Discharge Instructions (Signed)
Stop sudafed.   Try flonase as needed for congestion.   Stay hydrated.   Take tylenol, motrin for muscle aches   See your doctor.   Return to ER if you have worse abdominal cramps, vomiting, fevers, leg cramps.

## 2016-01-07 NOTE — ED Notes (Signed)
Pt, her husband, and belongings are noted to be absent from room. Pt was instructed to tell staff prior to leaving when she felt better. They notified the secretary and left in NAD.

## 2016-01-07 NOTE — ED Provider Notes (Signed)
CSN: [id number]     Arrival date & time 01/07/16  1530 History   First MD Initiated Contact with Patient 01/07/16 1626     Chief Complaint  Patient presents with  . Leg Pain     (Consider location/radiation/quality/duration/timing/severity/associated sxs/prior Treatment) The history is provided by the patient.  [name] is a 50 y.o. female hx of HIV (CD 4 500 in 2016), Leg paresthesias from HIV, who presenting with leg cramps, abdominal cramps. Patient states that she used Sudafed this morning and then about an hour afterwards, she noticed diffuse cramps in her legs as well as her abdomen. Feel nauseated but did not vomit. She states that the cramps come and go but denies any leg swelling. Denies any history of DVT and denies any chest pain or shortness of breath. She states that she is compliant with her HIV meds.    Past Medical History  Diagnosis Date  . HIV infection (HCC)   . Shingles     2000  . Anemia   . Hypertension     not on medication   . GERD (gastroesophageal reflux disease)   . Headache     hx of migraines   . Myocardial infarction (HCC)     mild MI 09/2004     no heart problems since  . 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 HIV disease (HCC)   . History of cocaine abuse    Past Surgical History  Procedure Laterality Date  . Cesarean section    . Abdominal hysterectomy  1993    Partial  . Tonsillectomy  1988  . Laparoscopic assisted ventral hernia repair N/A 01/02/2015    Procedure: LAPAROSCOPIC VENTRAL WALL HERNIA REPAIR;  Surgeon: Karie SodaSteven Gross, MD;  Location: [facility];  Service: General;  Laterality: N/A;  With MESH   Family History  Problem Relation Age of Onset  . Diabetes type II Brother   . Migraines Neg Hx    Social History  Substance Use Topics  . Smoking status: Former Smoker    Types: Cigarettes  . Smokeless tobacco: Never Used  . Alcohol Use: Yes     Comment:  rare red wine    OB History    No data available     Review of Systems  Gastrointestinal: Positive for abdominal pain.  Musculoskeletal:       Leg cramps,   All other systems reviewed and are negative.     Allergies  Morphine and Doxycycline hyclate  Home Medications   Prior to Admission medications   Medication Sig Start Date End Date Taking? Authorizing Provider  Chlorphen-Phenyleph-ASA (ALKA-SELTZER PLUS COLD PO) Take 1 each by mouth at bedtime as needed (cold).    Historical Provider, MD  darunavir-cobicistat (PREZCOBIX) 800-150 MG per tablet Take 1 tablet by mouth daily. Swallow whole. Do NOT crush, break or chew tablets. Take with food. 06/25/15   Randall Hissornelius N Van Dam, MD  emtricitabine-tenofovir AF (DESCOVY) 200-25 MG per tablet Take 1 tablet by mouth daily. 06/25/15   Randall Hissornelius N Van Dam, MD  gabapentin (NEURONTIN) 300 MG capsule Take 1 capsule (300 mg total) by mouth 3 (three) times daily. 06/25/15   Randall Hissornelius N Van Dam, MD  hydrochlorothiazide (HYDRODIURIL) 25 MG tablet Take 1 tablet (25 mg total) by mouth daily. 09/03/15   Randall Hissornelius N Van Dam, MD  metoprolol succinate (TOPROL-XL) 50 MG 24 hr tablet Take 1 tablet (50 mg total) by  mouth at bedtime. 09/03/15   Randall Hiss, MD  omeprazole (PRILOSEC) 20 MG capsule Take 1 capsule (20 mg total) by mouth 2 (two) times daily. 07/14/15   Randall Hiss, MD  valACYclovir (VALTREX) 1000 MG tablet TAKE 1 TABLET BY MOUTH EVERY DAY 04/25/15   Randall Hiss, MD   BP 111/86 mmHg  Pulse 90  Temp(Src) 98 F (36.7 C) (Oral)  Resp 20  Ht  (1.651 m)  Wt 216 lb (97.977 kg)  BMI 35.94 kg/m2  SpO2 100% Physical Exam  Constitutional: She is oriented to person, place, and time. She appears well-developed.  Uncomfortable   HENT:  Head: Normocephalic.  Mouth/Throat: Oropharynx is clear and moist.  Eyes: Conjunctivae are normal. Pupils are equal, round, and reactive to light.  Neck: Normal range of motion. Neck supple.   Cardiovascular: Normal rate, regular rhythm and normal heart sounds.   Pulmonary/Chest: Effort normal and breath sounds normal. No respiratory distress. She has no wheezes. She has no rales.  Abdominal: Soft. Bowel sounds are normal. She exhibits no distension. There is no tenderness. There is no rebound.  Musculoskeletal: Normal range of motion.  Mild diffuse thigh and calf tenderness, no edema   Neurological: She is alert and oriented to person, place, and time.  Skin: Skin is warm.  Psychiatric: She has a normal mood and affect. Her behavior is normal. Judgment and thought content normal.  Nursing note and vitals reviewed.   ED Course  Procedures (including critical care time) Labs Review Labs Reviewed  COMPREHENSIVE METABOLIC PANEL - Abnormal; Notable for the following:    Glucose, Bld 100 (*)    All other components within normal limits  CK - Abnormal; Notable for the following:    Total CK 291 (*)    All other components within normal limits  LIPASE, BLOOD - Abnormal; Notable for the following:    Lipase 57 (*)    All other components within normal limits  URINALYSIS, ROUTINE W REFLEX MICROSCOPIC (NOT AT [facility]) - Abnormal; Notable for the following:    APPearance HAZY (*)    All other components within normal limits  CBC WITH DIFFERENTIAL/PLATELET    Imaging Review Dg Abd Acute W/chest  01/07/2016  CLINICAL DATA:  Severe leg cramping, lower abdominal cramping and nausea since earlier today, history HIV, MI, hypertension, GERD EXAM: DG ABDOMEN ACUTE W/ 1V CHEST COMPARISON:  Chest radiographs 12/22/2011, abdominal radiographs 08/06/2010 FINDINGS: Normal heart size, mediastinal contours, and pulmonary vascularity. Bronchitic changes without infiltrate, pleural effusion or pneumothorax. Normal bowel gas pattern. No bowel dilatation, bowel wall thickening, or free intraperitoneal air. Osseous structures intact. Pelvic phleboliths again noted. No definite urinary tract calcification.  IMPRESSION: No acute abdominal findings. Minimal bronchitic changes. Electronically Signed   By: Ulyses Southward M.D.   On: 01/07/2016 16:51   I have personally reviewed and evaluated these images and lab results as part of my medical decision-making.   EKG Interpretation None      MDM   Final diagnoses:  None    [name] is a 50 y.o. female here with diffuse cramps after taking sudafed. No shortness of breath, no throat closing, no rash. Unusual presentation for allergic reaction. Likely worsening chronic paresthesias vs rhabdo vs viral. No signs of DVT/PE. Will check labs, CK, will give pain meds and hydrate and reassess.   5:57 PM Xrays unremarkable. Lipase 57, CK 291. Cr nl. I think likely viral syndrome. I see no signs of  allergic reaction. She took sudafed for sinus congestion. I see no signs of sinusitis. Recommend flonase, tylenol motrin, hydration.    Richardean Canal, MD 01/07/16 Windy Fast

## 2016-01-07 NOTE — ED Notes (Signed)
C/o bilat leg cramps since 230pm-brought in by EMS-to triage in w/c

## 2016-01-07 NOTE — ED Notes (Signed)
Pt reports she continues to have nausea no further emesis at this time

## 2016-01-07 NOTE — ED Notes (Signed)
Pt in waiting area waiting for her husband to pull the car around. Pt has sudden onset of vomiting. Given cool wash cloth, emesis bag, taken back to room 3 in w/c by Madaline GuthrieFernando, emt. Pt assisted into bed, states she feels much better lying down. Pt declines any medications at this time, "I think the nausea has passed..." given call light to call for assistance if nausea returns. Pt states 'If I can just rest here for a few minutes, I think I'll be OK". Benna Dunksaryn, RN notified of pt return to room 3, report given.

## 2016-01-21 ENCOUNTER — Other Ambulatory Visit: Payer: Self-pay | Admitting: Infectious Disease

## 2016-01-21 DIAGNOSIS — B20 Human immunodeficiency virus [HIV] disease: Secondary | ICD-10-CM

## 2016-01-21 MED ORDER — DARUNAVIR-COBICISTAT 800-150 MG PO TABS: 1.0000 | ORAL_TABLET | Freq: Every day | ORAL | Status: DC

## 2016-01-21 MED ORDER — EMTRICITABINE-TENOFOVIR AF 200-25 MG PO TABS: 1.0000 | ORAL_TABLET | Freq: Every day | ORAL | Status: DC

## 2016-01-22 ENCOUNTER — Other Ambulatory Visit: Payer: Self-pay | Admitting: Pharmacist Clinician (PhC)/ Clinical Pharmacy Specialist

## 2016-01-22 DIAGNOSIS — B20 Human immunodeficiency virus [HIV] disease: Secondary | ICD-10-CM

## 2016-01-22 MED ORDER — EMTRICITABINE-TENOFOVIR DF 200-300 MG PO TABS: 1.0000 | ORAL_TABLET | Freq: Every day | ORAL | Status: DC

## 2016-01-22 NOTE — Progress Notes (Signed)
Called [name] about the Flonase issue due to the interaction with Prezcobix that [name] informed me of yesterday. She doesn't take it. When asked her if she has been taking her HIV meds consistently. She stated that she had severe "side effects" with the Descovy so she takes Truvada instead. I repeat several times whether she is taking both instead of just one. She confirmed that she is taking prezcobix + Truvada. Going to bring her back tomorrow for HIV VL and [name] is going to schedule her with [name].

## 2016-01-23 ENCOUNTER — Other Ambulatory Visit: Payer: Medicare Other

## 2016-01-23 ENCOUNTER — Other Ambulatory Visit: Payer: Self-pay | Admitting: *Deleted

## 2016-01-23 ENCOUNTER — Other Ambulatory Visit: Payer: Self-pay | Admitting: Infectious Disease

## 2016-01-23 DIAGNOSIS — B20 Human immunodeficiency virus [HIV] disease: Secondary | ICD-10-CM

## 2016-01-23 MED ORDER — EMTRICITABINE-TENOFOVIR AF 200-25 MG PO TABS: 1.0000 | ORAL_TABLET | Freq: Every day | ORAL | Status: DC

## 2016-01-26 LAB — HIV-1 RNA QUANT-NO REFLEX-BLD
HIV 1 RNA QUANT: 64 {copies}/mL — AB (ref <20)
HIV-1 RNA Quant, Log: 1.81 Log copies/mL — ABNORMAL HIGH (ref <1.30)

## 2016-02-21 ENCOUNTER — Other Ambulatory Visit: Payer: Self-pay | Admitting: Neurology

## 2016-02-23 ENCOUNTER — Encounter: Payer: Self-pay | Admitting: Infectious Disease

## 2016-02-23 ENCOUNTER — Ambulatory Visit (INDEPENDENT_AMBULATORY_CARE_PROVIDER_SITE_OTHER): Payer: Medicare Other | Admitting: Infectious Disease

## 2016-02-23 VITALS — BP 128/82 | HR 80 | Temp 97.9°F | Wt 224.0 lb

## 2016-02-23 DIAGNOSIS — G43109 Migraine with aura, not intractable, without status migrainosus: Secondary | ICD-10-CM

## 2016-02-23 DIAGNOSIS — R112 Nausea with vomiting, unspecified: Secondary | ICD-10-CM | POA: Diagnosis not present

## 2016-02-23 DIAGNOSIS — K219 Gastro-esophageal reflux disease without esophagitis: Secondary | ICD-10-CM

## 2016-02-23 DIAGNOSIS — I10 Essential (primary) hypertension: Secondary | ICD-10-CM

## 2016-02-23 DIAGNOSIS — B20 Human immunodeficiency virus [HIV] disease: Secondary | ICD-10-CM | POA: Diagnosis not present

## 2016-02-23 DIAGNOSIS — G63 Polyneuropathy in diseases classified elsewhere: Secondary | ICD-10-CM

## 2016-02-23 MED ORDER — HYDROCHLOROTHIAZIDE 25 MG PO TABS: 25.0000 mg | ORAL_TABLET | Freq: Every day | ORAL | Status: DC

## 2016-02-23 MED ORDER — CETIRIZINE HCL 5 MG/5ML PO SYRP: 5.0000 mg | ORAL_SOLUTION | Freq: Every day | ORAL | Status: DC

## 2016-02-23 MED ORDER — GABAPENTIN 300 MG PO CAPS: 300.0000 mg | ORAL_CAPSULE | Freq: Three times a day (TID) | ORAL | Status: DC

## 2016-02-23 MED ORDER — DARUNAVIR-COBICISTAT 800-150 MG PO TABS: 1.0000 | ORAL_TABLET | Freq: Every day | ORAL | Status: DC

## 2016-02-23 MED ORDER — METOPROLOL SUCCINATE ER 50 MG PO TB24: 50.0000 mg | ORAL_TABLET | Freq: Every day | ORAL | Status: DC

## 2016-02-23 MED ORDER — VALACYCLOVIR HCL 1 G PO TABS: 1000.0000 mg | ORAL_TABLET | Freq: Every day | ORAL | Status: DC

## 2016-02-23 MED ORDER — TRUVADA 200-300 MG PO TABS: 1.0000 | ORAL_TABLET | Freq: Every day | ORAL | Status: DC

## 2016-02-23 MED ORDER — OMEPRAZOLE 20 MG PO CPDR: 20.0000 mg | DELAYED_RELEASE_CAPSULE | Freq: Two times a day (BID) | ORAL | Status: DC

## 2016-02-23 NOTE — Progress Notes (Signed)
Chief complaint: followup for HIV on medications having had trouble with an episode of vomiting when she started her DESCOVY also with recent sinus symptoms for which she took Sudafed which caused these body aches for which she sought care in the ER where she was scribed narcotics which caused vomiting  Subjective:    Patient ID: [name], female    DOB: 05-17-1966, 50 y.o.   MRN: [id number]  HPI   50 year old African American lady with HIV and AIDS who this  once again been able to perfectly suppress her viral load to <20 on PREZCOBIX and Truvada after having failed STRIBILD with with resistanceto integrase including raltegravir and EVG but not to DTG,   We changed her to rest And DESCOVY but after taking one tablet of DESCOVY with her PREZCOBIX she vomited and thought that the DESCOVY was responsible for this so switched back to PREZCOBIX and Truvada.   I had extensive discussion with her about the fact that DESCOVY and Truvada are both pro-drugs for the active components that are the same in both but the DESCOVY has less risk for renal toxicity and bone toxicity.  We came up with a plan to see if she can trial the DESCOVY again and if she tolerates that we can switch her drugs to PREZCOBIX and DESCOVY if she does not tolerate them we will change to PREZCOBIX and Truvada she was frustrated by the fact that the meds in Epic did not match what she was getting at the pharmacy and that she was getting DESCOVY along with [facility] at [facility]. I offered to change her over to [facility] outpatient pharmacy for mailing of her antiretrovirals and she was in agreement with this which would see what happens with her retrial of the DESCOVY first before sending meds to [facility] outpatient pharmacy.  Her myalgias that she had after taking Sudafed have resolved her nausea is resolved I have recommended trying Zyrtec for her allergic symptoms she had taken Flonase before but was frightened by  the idea of taking it when she was told about the drug interaction with cobi. .   Past Medical History  Diagnosis Date  . HIV infection (HCC)   . Shingles     2000  . Anemia   . Hypertension     not on medication   . GERD (gastroesophageal reflux disease)   . Headache     hx of migraines   . Myocardial infarction (HCC)     mild MI 09/2004     no heart problems since  . 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 HIV disease (HCC)   . History of cocaine abuse     Past Surgical History  Procedure Laterality Date  . Cesarean section    . Abdominal hysterectomy  1993    Partial  . Tonsillectomy  1988  . Laparoscopic assisted ventral hernia repair N/A 01/02/2015    Procedure: LAPAROSCOPIC VENTRAL WALL HERNIA REPAIR;  Surgeon: Karie Soda, MD;  Location: [facility];  Service: General;  Laterality: N/A;  With MESH    Family History  Problem Relation Age of Onset  . Diabetes type II Brother   . Migraines Neg Hx       Social History   Social History  . Marital Status: Single    Spouse Name: N/A  . Number of Children: N/A  . Years of Education: HS  Occupational History  . disabled    Social History Main Topics  . Smoking status: Former Smoker    Types: Cigarettes  . Smokeless tobacco: Never Used  . Alcohol Use: Yes     Comment: rare red wine   . Drug Use: 14.00 per week    Special: Marijuana  . Sexual Activity: Not Currently     Comment: pt. declined condoms   Other Topics Concern  . None   Social History Narrative    Allergies  Allergen Reactions  . Morphine Other (See Comments)    Burning under the skin  . Doxycycline Hyclate Rash     Current outpatient prescriptions:  .  Chlorphen-Phenyleph-ASA (ALKA-SELTZER PLUS COLD PO), Take 1 each by mouth at bedtime as needed (cold)., Disp: , Rfl:  .  darunavir-cobicistat (PREZCOBIX) 800-150 MG tablet, Take 1 tablet by mouth daily., Disp: 30  tablet, Rfl: 11 .  gabapentin (NEURONTIN) 300 MG capsule, Take 1 capsule (300 mg total) by mouth 3 (three) times daily., Disp: 90 capsule, Rfl: 4 .  hydrochlorothiazide (HYDRODIURIL) 25 MG tablet, Take 1 tablet (25 mg total) by mouth daily., Disp: 90 tablet, Rfl: 3 .  metoprolol succinate (TOPROL-XL) 50 MG 24 hr tablet, Take 1 tablet (50 mg total) by mouth at bedtime., Disp: 90 tablet, Rfl: 4 .  omeprazole (PRILOSEC) 20 MG capsule, Take 1 capsule (20 mg total) by mouth 2 (two) times daily., Disp: 180 capsule, Rfl: 4 .  valACYclovir (VALTREX) 1000 MG tablet, Take 1 tablet (1,000 mg total) by mouth daily., Disp: 90 tablet, Rfl: 5 .  cetirizine HCl (ZYRTEC) 5 MG/5ML SYRP, Take 5 mLs (5 mg total) by mouth daily., Disp: 300 mL, Rfl: 30 .  TRUVADA 200-300 MG tablet, Take 1 tablet by mouth daily., Disp: 30 tablet, Rfl: 11 .  [DISCONTINUED] pantoprazole sodium (PROTONIX) 40 mg/20 mL PACK, Place 20 mLs (40 mg total) into feeding tube daily at 12 noon., Disp: 30 each, Rfl: 11    Review of Systems  Constitutional: Negative for fever, chills, diaphoresis, activity change, appetite change, fatigue and unexpected weight change.  HENT: Negative for congestion, rhinorrhea, sinus pressure, sneezing, sore throat and trouble swallowing.   Eyes: Negative for photophobia and visual disturbance.  Respiratory: Negative for cough, chest tightness, shortness of breath, wheezing and stridor.   Cardiovascular: Negative for chest pain, palpitations and leg swelling.  Gastrointestinal: Negative for nausea, vomiting, abdominal pain, diarrhea, constipation, blood in stool, abdominal distention and anal bleeding.  Genitourinary: Negative for dysuria, hematuria, flank pain and difficulty urinating.  Musculoskeletal: Negative for back pain, joint swelling, arthralgias and gait problem.  Skin: Negative for color change, pallor, rash and wound.  Neurological: Negative for dizziness, tremors, weakness and light-headedness.   Hematological: Negative for adenopathy. Does not bruise/bleed easily.  Psychiatric/Behavioral: Negative for behavioral problems, confusion, sleep disturbance, dysphoric mood, decreased concentration and agitation.       Objective:   Physical Exam  Constitutional: She is oriented to person, place, and time. She appears well-developed and well-nourished. No distress.  HENT:  Head: Normocephalic and atraumatic.  Mouth/Throat: Oropharynx is clear and moist. No oropharyngeal exudate.  Eyes: Conjunctivae and EOM are normal. Pupils are equal, round, and reactive to light. No scleral icterus.  Neck: Normal range of motion. Neck supple. No JVD present.  Cardiovascular: Normal rate, regular rhythm and normal heart sounds.   Pulmonary/Chest: Effort normal and breath sounds normal. No respiratory distress. She has no wheezes.  Abdominal: She exhibits no distension. There is no  tenderness.  Musculoskeletal: She exhibits no edema or tenderness.  Lymphadenopathy:    She has no cervical adenopathy.  Neurological: She is alert and oriented to person, place, and time. She has normal reflexes. She exhibits normal muscle tone. Coordination normal.  Skin: Skin is warm and dry. She is not diaphoretic. No erythema. No pallor.  Psychiatric: She has a normal mood and affect. Her behavior is normal. Judgment and thought content normal.  Nursing note and vitals reviewed.         Assessment & Plan:  HIV continue PREZCOBIX and re-trial of DESCOVY. IF she tolerates DESCOVY then we will send PREZCOBIX and DESCOVY prescriptions to the [facility] outpatient pharmacy 90 day supply will be preferable if [facility] can fill this and send it to her. If she does not tolerate the DESCOVY we will send in prescriptions for PREZCOBIX and Truvada do about this, outpatient pharmacy   Headaches:  Being followed by Neurology   HTN: continue Toprol and HCTZ  Morbid obesity: Continue weight loss efforts   HIV neuropathy:  continue gabapentin  Seasonal allergies: try zyrtec  GERD: she is on BID PPI (likely overkill but I will not tackle this today  We spent greater than 40 minutes with the patient including greater than 50% of time in face to face counsel of the patient regarding her new HIV regimen treatment of her HIV review of all of her labs , HTN, neuropathy , GERD and in coordination of her care.

## 2016-02-23 NOTE — Patient Instructions (Signed)
Go ahead and re-try the PREZCOBIX with Descovy again. If you cannot tolerate it swtich back to Truvada  Either way let us know what you are taking in about a week and we can mail the meds to you

## 2016-03-24 ENCOUNTER — Telehealth: Payer: Self-pay | Admitting: *Deleted

## 2016-03-24 NOTE — Telephone Encounter (Signed)
The TRUVADA is bad for her kidneys NOT the descovy. These symptoms have NOTHING to do with Descovy

## 2016-03-24 NOTE — Telephone Encounter (Signed)
[name] called complaining of new back pain and leg pain, which she feels is attributed to switching from truvada -> descovy 1 month ago.  Upon discussion, she states that this has happened before.  She is worried that it might be her kidneys, as she remembers discussion about kidney function when making the medication switch. [name] has not tried anything for muscle pain, states she did sleep on the couch last night.  [facility] to make sure it's not the new medication and to see what she might be able to take for the pain.  Message given to [facility] for call back. Andree CossHowell, Michelle M, RN

## 2016-03-25 ENCOUNTER — Other Ambulatory Visit: Payer: Self-pay | Admitting: Pharmacist Clinician (PhC)/ Clinical Pharmacy Specialist

## 2016-03-25 MED ORDER — EMTRICITABINE-TENOFOVIR AF 200-25 MG PO TABS: 1.0000 | ORAL_TABLET | Freq: Every day | ORAL | Status: DC

## 2016-03-25 NOTE — Telephone Encounter (Signed)
Order for Descovy was never sent anyway. Talked to her today and told to take schedule ibuprofen 600mg  TID x 2-3 days. Will send the rx for Descovy to her pharmacy today.

## 2016-03-25 NOTE — Telephone Encounter (Signed)
(  sigh)

## 2016-03-25 NOTE — Telephone Encounter (Signed)
I reminded her that she was switched to protect her kidneys and bones.  That's when she said that this may have happened before.  But she wasn't sure what she could - or should - take.  She wanted to confirm with [facility].

## 2016-05-12 ENCOUNTER — Other Ambulatory Visit: Payer: Medicare Other

## 2016-05-24 ENCOUNTER — Ambulatory Visit: Payer: Medicare Other | Admitting: Infectious Disease

## 2016-05-26 ENCOUNTER — Telehealth: Payer: Self-pay | Admitting: *Deleted

## 2016-05-26 NOTE — Telephone Encounter (Signed)
Able to reach patient by phone and schedule lab and MD appointments.

## 2016-06-17 ENCOUNTER — Other Ambulatory Visit (HOSPITAL_COMMUNITY)
Admission: RE | Admit: 2016-06-17 | Discharge: 2016-06-17 | Disposition: A | Discharge status: Home / Self Care | Payer: Medicare Other | Source: Ambulatory Visit | Attending: Infectious Disease | Admitting: Infectious Disease

## 2016-06-17 ENCOUNTER — Other Ambulatory Visit: Payer: Medicare Other

## 2016-06-17 DIAGNOSIS — Z113 Encounter for screening for infections with a predominantly sexual mode of transmission: Secondary | ICD-10-CM | POA: Insufficient documentation

## 2016-06-17 DIAGNOSIS — B20 Human immunodeficiency virus [HIV] disease: Secondary | ICD-10-CM

## 2016-06-17 LAB — CBC WITH DIFFERENTIAL/PLATELET
BASOS PCT: 1 %
Basophils Absolute: 88 cells/uL (ref 0–200)
EOS PCT: 5 %
Eosinophils Absolute: 440 cells/uL (ref 15–500)
HEMATOCRIT: 38.2 % (ref 35.0–45.0)
HEMOGLOBIN: 12.3 g/dL (ref 11.7–15.5)
LYMPHS ABS: 3608 {cells}/uL (ref 850–3900)
LYMPHS PCT: 41 %
MCH: 28.1 pg (ref 27.0–33.0)
MCHC: 32.2 g/dL (ref 32.0–36.0)
MCV: 87.2 fL (ref 80.0–100.0)
MONO ABS: 616 {cells}/uL (ref 200–950)
MPV: 10.1 fL (ref 7.5–12.5)
Monocytes Relative: 7 %
Neutro Abs: 4048 cells/uL (ref 1500–7800)
Neutrophils Relative %: 46 %
Platelets: 399 10*3/uL (ref 140–400)
RBC: 4.38 MIL/uL (ref 3.80–5.10)
RDW: 15.1 % — AB (ref 11.0–15.0)
WBC: 8.8 10*3/uL (ref 3.8–10.8)

## 2016-06-17 LAB — LIPID PANEL
CHOL/HDL RATIO: 6 ratio — AB (ref <=5.0)
CHOLESTEROL: 227 mg/dL — AB (ref 125–200)
HDL: 38 mg/dL — ABNORMAL LOW (ref >=46)
LDL Cholesterol: 149 mg/dL — ABNORMAL HIGH (ref <130)
Triglycerides: 199 mg/dL — ABNORMAL HIGH (ref <150)
VLDL: 40 mg/dL — ABNORMAL HIGH (ref <30)

## 2016-06-17 LAB — COMPLETE METABOLIC PANEL WITH GFR
ALK PHOS: 92 U/L (ref 33–130)
ALT: 28 U/L (ref 6–29)
AST: 18 U/L (ref 10–35)
Albumin: 4.1 g/dL (ref 3.6–5.1)
BUN: 15 mg/dL (ref 7–25)
CALCIUM: 9.7 mg/dL (ref 8.6–10.4)
CHLORIDE: 104 mmol/L (ref 98–110)
CO2: 26 mmol/L (ref 20–31)
Creat: 1.1 mg/dL — ABNORMAL HIGH (ref 0.50–1.05)
GFR, EST AFRICAN AMERICAN: 68 mL/min (ref >=60)
GFR, Est Non African American: 59 mL/min — ABNORMAL LOW (ref >=60)
Glucose, Bld: 117 mg/dL — ABNORMAL HIGH (ref 65–99)
POTASSIUM: 4.4 mmol/L (ref 3.5–5.3)
Sodium: 142 mmol/L (ref 135–146)
Total Bilirubin: 0.3 mg/dL (ref 0.2–1.2)
Total Protein: 7.2 g/dL (ref 6.1–8.1)

## 2016-06-18 LAB — T-HELPER CELL (CD4) - (RCID CLINIC ONLY)
CD4 T CELL HELPER: 17 % — AB (ref 33–55)
CD4 T Cell Abs: 620 /uL (ref 400–2700)

## 2016-06-18 LAB — URINE CYTOLOGY ANCILLARY ONLY
CHLAMYDIA, DNA PROBE: NEGATIVE
NEISSERIA GONORRHEA: NEGATIVE

## 2016-06-18 LAB — RPR

## 2016-06-21 LAB — HIV-1 RNA QUANT-NO REFLEX-BLD
HIV 1 RNA QUANT: 54 {copies}/mL — AB (ref <20)
HIV-1 RNA Quant, Log: 1.73 Log copies/mL — ABNORMAL HIGH (ref <1.30)

## 2016-07-07 ENCOUNTER — Ambulatory Visit (INDEPENDENT_AMBULATORY_CARE_PROVIDER_SITE_OTHER): Payer: Medicare Other | Admitting: Infectious Disease

## 2016-07-07 ENCOUNTER — Encounter: Payer: Self-pay | Admitting: Infectious Disease

## 2016-07-07 VITALS — Ht 65.0 in | Wt 212.0 lb

## 2016-07-07 DIAGNOSIS — Z23 Encounter for immunization: Secondary | ICD-10-CM

## 2016-07-07 DIAGNOSIS — G43009 Migraine without aura, not intractable, without status migrainosus: Secondary | ICD-10-CM | POA: Diagnosis not present

## 2016-07-07 DIAGNOSIS — I10 Essential (primary) hypertension: Secondary | ICD-10-CM

## 2016-07-07 DIAGNOSIS — B20 Human immunodeficiency virus [HIV] disease: Secondary | ICD-10-CM | POA: Diagnosis not present

## 2016-07-07 NOTE — Progress Notes (Signed)
Chief complaint: followup for HIV on medications  Subjective:    Patient ID: [name], female    DOB: [birth year], 50 y.o.   MRN: [id number]  HPI  50 year old African American lady with HIV and AIDS who this  once again been able to perfectly suppress her viral load to <20 on PREZCOBIX and Truvada after having failed STRIBILD with with resistanceto integrase including raltegravir and EVG but not to DTG,   We changed her to rest And DESCOVY but after taking one tablet of DESCOVY with her PREZCOBIX she vomited and thought that the DESCOVY was responsible for this so switched back to PREZCOBIX and Truvada.   I had extensive discussion with her about the fact that DESCOVY and Truvada are both pro-drugs for the active components that are the same in both but the DESCOVY has less risk for renal toxicity and bone toxicity.  We came up with a plan to see if she can trial the DESCOVY again and if she tolerates that we can switch her drugs to PREZCOBIX and DESCOVY if she does not tolerate them we will change to PREZCOBIX and Truvada she was frustrated by the fact that the meds in Epic did not match what she was getting at the pharmacy and that she was getting DESCOVY [facility]. I offered [facility] for mailing of her antiretrovirals and she was in agreement with this which would see what happens with her retrial of the DESCOVY first before sending meds to [facility].  She has had resolution in her symptoms after restarting PREZCOBIX and Descovy.  Her viral load has remained suppressed and CD4 is healthy.   Past Medical History:  Diagnosis Date  . Anemia   . GERD (gastroesophageal reflux disease)   . Headache    hx of migraines   . History of cocaine abuse   . HIV disease (HCC)   . HIV infection (HCC)   . Hypertension    not on medication   . Incarcerated incisional hernia s/p lap VWH repair  01/02/2015 12/18/2014  . Myocardial infarction (HCC)    mild MI 09/2004     no heart problems since  . 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 Shingles    2000    Past Surgical History:  Procedure Laterality Date  . ABDOMINAL HYSTERECTOMY  1993   Partial  . CESAREAN SECTION    . LAPAROSCOPIC ASSISTED VENTRAL HERNIA REPAIR N/A 01/02/2015   Procedure: LAPAROSCOPIC VENTRAL WALL HERNIA REPAIR;  Surgeon: Karie SodaSteven Gross, MD;  Location: [facility];  Service: General;  Laterality: N/A;  With MESH  . TONSILLECTOMY  1988    Family History  Problem Relation Age of Onset  . Diabetes type II Brother   . Migraines Neg Hx       Social History   Social History  . Marital status: Single    Spouse name: N/A  . Number of children: N/A  . Years of education: HS   Occupational History  . disabled    Social History Main Topics  . Smoking status: Former Smoker    Types: Cigarettes  . Smokeless tobacco: Never Used  . Alcohol use Yes     Comment: rare red wine   . Drug use:     Frequency: 14.0 times per week    Types: Marijuana  . Sexual activity: Not Currently     Comment: pt. declined  condoms   Other Topics Concern  . None   Social History Narrative  . None    Allergies  Allergen Reactions  . Morphine Other (See Comments)    Burning under the skin  . Doxycycline Hyclate Rash     Current Outpatient Prescriptions:  .  darunavir-cobicistat (PREZCOBIX) 800-150 MG tablet, Take 1 tablet by mouth daily., Disp: 30 tablet, Rfl: 11 .  emtricitabine-tenofovir AF (DESCOVY) 200-25 MG tablet, Take 1 tablet by mouth daily., Disp: 30 tablet, Rfl: 11 .  gabapentin (NEURONTIN) 300 MG capsule, Take 1 capsule (300 mg total) by mouth 3 (three) times daily., Disp: 90 capsule, Rfl: 4 .  hydrochlorothiazide (HYDRODIURIL) 25 MG tablet, Take 1 tablet (25 mg total) by mouth daily., Disp: 90 tablet, Rfl: 3 .  metoprolol succinate (TOPROL-XL) 50 MG 24 hr tablet, Take 1 tablet (50 mg total) by mouth at  bedtime., Disp: 90 tablet, Rfl: 4 .  valACYclovir (VALTREX) 1000 MG tablet, Take 1 tablet (1,000 mg total) by mouth daily., Disp: 90 tablet, Rfl: 5 .  omeprazole (PRILOSEC) 20 MG capsule, Take 1 capsule (20 mg total) by mouth 2 (two) times daily., Disp: 180 capsule, Rfl: 4    Review of Systems  Constitutional: Negative for activity change, appetite change, chills, diaphoresis, fatigue, fever and unexpected weight change.  HENT: Negative for congestion, rhinorrhea, sinus pressure, sneezing, sore throat and trouble swallowing.   Eyes: Negative for photophobia and visual disturbance.  Respiratory: Negative for cough, chest tightness, shortness of breath, wheezing and stridor.   Cardiovascular: Negative for chest pain, palpitations and leg swelling.  Gastrointestinal: Negative for abdominal distention, abdominal pain, anal bleeding, blood in stool, constipation, diarrhea, nausea and vomiting.  Genitourinary: Negative for difficulty urinating, dysuria, flank pain and hematuria.  Musculoskeletal: Negative for arthralgias, back pain, gait problem and joint swelling.  Skin: Negative for color change, pallor, rash and wound.  Neurological: Negative for dizziness, tremors, weakness and light-headedness.  Hematological: Negative for adenopathy. Does not bruise/bleed easily.  Psychiatric/Behavioral: Negative for agitation, behavioral problems, confusion, decreased concentration, dysphoric mood and sleep disturbance.       Objective:   Physical Exam  Constitutional: She is oriented to person, place, and time. She appears well-developed and well-nourished. No distress.  HENT:  Head: Normocephalic and atraumatic.  Mouth/Throat: Oropharynx is clear and moist. No oropharyngeal exudate.  Eyes: Conjunctivae and EOM are normal. Pupils are equal, round, and reactive to light. No scleral icterus.  Neck: Normal range of motion. Neck supple. No JVD present.  Cardiovascular: Normal rate, regular rhythm and  normal heart sounds.   Pulmonary/Chest: Effort normal and breath sounds normal. No respiratory distress. She has no wheezes.  Abdominal: She exhibits no distension. There is no tenderness.  Musculoskeletal: She exhibits no edema or tenderness.  Lymphadenopathy:    She has no cervical adenopathy.  Neurological: She is alert and oriented to person, place, and time. She has normal reflexes. She exhibits normal muscle tone. Coordination normal.  Skin: Skin is warm and dry. She is not diaphoretic. No erythema. No pallor.  Psychiatric: She has a normal mood and affect. Her behavior is normal. Judgment and thought content normal.  Nursing note and vitals reviewed.         Assessment & Plan:  HIV continue PREZCOBIX and  DESCOVY.  Recheck labs and see her back in 6 months  Headaches:  Being followed by Neurology   HTN: continue Toprol and HCTZ BP has been well controlled  Morbid obesity: Continue weight loss  efforts   HIV neuropathy: continue gabapentin

## 2016-07-09 DIAGNOSIS — Z23 Encounter for immunization: Secondary | ICD-10-CM | POA: Diagnosis not present

## 2016-11-23 ENCOUNTER — Telehealth: Payer: Self-pay | Admitting: *Deleted

## 2016-11-23 NOTE — Telephone Encounter (Signed)
Patient called stating she only has two pills left of her hiv meds and the pharmacy told her that she got a 90 day supply 10/11/16 and she said she only got 30. I called the pharmacy and they show that she got 90 Prescobix on 10/11/16 and can not fill it until 12/15/16. Called the patient back and she said it shows on her bottle that she received #30. She is going to go to the pharmacy tomorrow and show them her pill bottle.

## 2016-11-24 ENCOUNTER — Other Ambulatory Visit: Payer: Medicare Other

## 2016-12-08 ENCOUNTER — Ambulatory Visit: Payer: Medicare Other | Admitting: Infectious Disease

## 2016-12-29 ENCOUNTER — Ambulatory Visit: Payer: Medicare Other | Admitting: Infectious Disease

## 2017-03-02 ENCOUNTER — Other Ambulatory Visit: Payer: Self-pay | Admitting: Infectious Disease

## 2017-03-02 DIAGNOSIS — B20 Human immunodeficiency virus [HIV] disease: Secondary | ICD-10-CM

## 2017-03-02 DIAGNOSIS — G629 Polyneuropathy, unspecified: Secondary | ICD-10-CM

## 2017-03-30 ENCOUNTER — Other Ambulatory Visit: Payer: Self-pay | Admitting: Infectious Disease

## 2017-04-06 ENCOUNTER — Other Ambulatory Visit: Payer: Self-pay | Admitting: Infectious Disease

## 2017-04-06 DIAGNOSIS — G629 Polyneuropathy, unspecified: Secondary | ICD-10-CM

## 2017-04-06 DIAGNOSIS — B20 Human immunodeficiency virus [HIV] disease: Secondary | ICD-10-CM

## 2017-04-27 ENCOUNTER — Other Ambulatory Visit: Payer: Medicare Other

## 2017-04-28 ENCOUNTER — Other Ambulatory Visit (HOSPITAL_COMMUNITY)
Admission: RE | Admit: 2017-04-28 | Discharge: 2017-04-28 | Disposition: A | Discharge status: Home / Self Care | Payer: Medicare Other | Source: Ambulatory Visit | Attending: Infectious Disease | Admitting: Infectious Disease

## 2017-04-28 ENCOUNTER — Other Ambulatory Visit: Payer: Medicare Other

## 2017-04-28 DIAGNOSIS — B20 Human immunodeficiency virus [HIV] disease: Secondary | ICD-10-CM | POA: Diagnosis present

## 2017-04-28 LAB — CBC WITH DIFFERENTIAL/PLATELET
BASOS PCT: 1 %
Basophils Absolute: 94 cells/uL (ref 0–200)
EOS PCT: 3 %
Eosinophils Absolute: 282 cells/uL (ref 15–500)
HCT: 38.7 % (ref 35.0–45.0)
HEMOGLOBIN: 12.4 g/dL (ref 11.7–15.5)
LYMPHS ABS: 5076 {cells}/uL — AB (ref 850–3900)
LYMPHS PCT: 54 %
MCH: 27.6 pg (ref 27.0–33.0)
MCHC: 32 g/dL (ref 32.0–36.0)
MCV: 86 fL (ref 80.0–100.0)
MONO ABS: 564 {cells}/uL (ref 200–950)
MPV: 9.7 fL (ref 7.5–12.5)
Monocytes Relative: 6 %
NEUTROS PCT: 36 %
Neutro Abs: 3384 cells/uL (ref 1500–7800)
Platelets: 427 10*3/uL — ABNORMAL HIGH (ref 140–400)
RBC: 4.5 MIL/uL (ref 3.80–5.10)
RDW: 15 % (ref 11.0–15.0)
WBC: 9.4 10*3/uL (ref 3.8–10.8)

## 2017-04-29 LAB — COMPLETE METABOLIC PANEL WITH GFR
ALBUMIN: 4.3 g/dL (ref 3.6–5.1)
ALK PHOS: 86 U/L (ref 33–130)
ALT: 17 U/L (ref 6–29)
AST: 15 U/L (ref 10–35)
BUN: 14 mg/dL (ref 7–25)
CALCIUM: 9.4 mg/dL (ref 8.6–10.4)
CHLORIDE: 106 mmol/L (ref 98–110)
CO2: 26 mmol/L (ref 20–31)
Creat: 0.94 mg/dL (ref 0.50–1.05)
GFR, EST NON AFRICAN AMERICAN: 70 mL/min (ref >=60)
GFR, Est African American: 81 mL/min (ref >=60)
Glucose, Bld: 91 mg/dL (ref 65–99)
POTASSIUM: 3.8 mmol/L (ref 3.5–5.3)
Sodium: 141 mmol/L (ref 135–146)
Total Bilirubin: 0.3 mg/dL (ref 0.2–1.2)
Total Protein: 7.1 g/dL (ref 6.1–8.1)

## 2017-04-29 LAB — LIPID PANEL
CHOLESTEROL: 223 mg/dL — AB (ref <200)
HDL: 36 mg/dL — ABNORMAL LOW (ref >50)
LDL Cholesterol: 136 mg/dL — ABNORMAL HIGH (ref <100)
TRIGLYCERIDES: 253 mg/dL — AB (ref <150)
Total CHOL/HDL Ratio: 6.2 Ratio — ABNORMAL HIGH (ref <5.0)
VLDL: 51 mg/dL — ABNORMAL HIGH (ref <30)

## 2017-04-29 LAB — T-HELPER CELL (CD4) - (RCID CLINIC ONLY)
CD4 % Helper T Cell: 20 % — ABNORMAL LOW (ref 33–55)
CD4 T CELL ABS: 1120 /uL (ref 400–2700)

## 2017-04-29 LAB — URINE CYTOLOGY ANCILLARY ONLY
CHLAMYDIA, DNA PROBE: NEGATIVE
NEISSERIA GONORRHEA: NEGATIVE

## 2017-04-29 LAB — RPR

## 2017-04-30 LAB — HIV-1 RNA,QN PCR W/REFLEX GENOTYPE: HIV-1 RNA, QN PCR: <20 Copies/mL

## 2017-05-06 ENCOUNTER — Other Ambulatory Visit: Payer: Self-pay | Admitting: Infectious Disease

## 2017-05-06 DIAGNOSIS — I10 Essential (primary) hypertension: Secondary | ICD-10-CM

## 2017-05-06 DIAGNOSIS — K219 Gastro-esophageal reflux disease without esophagitis: Secondary | ICD-10-CM

## 2017-05-11 ENCOUNTER — Encounter: Payer: Self-pay | Admitting: Infectious Disease

## 2017-05-11 ENCOUNTER — Ambulatory Visit (INDEPENDENT_AMBULATORY_CARE_PROVIDER_SITE_OTHER): Payer: Medicare Other | Admitting: Infectious Disease

## 2017-05-11 VITALS — BP 146/105 | HR 73 | Temp 98.4°F | Ht 67.0 in

## 2017-05-11 DIAGNOSIS — G629 Polyneuropathy, unspecified: Secondary | ICD-10-CM

## 2017-05-11 DIAGNOSIS — B20 Human immunodeficiency virus [HIV] disease: Secondary | ICD-10-CM | POA: Diagnosis not present

## 2017-05-11 DIAGNOSIS — M62838 Other muscle spasm: Secondary | ICD-10-CM

## 2017-05-11 DIAGNOSIS — G43009 Migraine without aura, not intractable, without status migrainosus: Secondary | ICD-10-CM

## 2017-05-11 DIAGNOSIS — G63 Polyneuropathy in diseases classified elsewhere: Secondary | ICD-10-CM

## 2017-05-11 DIAGNOSIS — K219 Gastro-esophageal reflux disease without esophagitis: Secondary | ICD-10-CM | POA: Diagnosis not present

## 2017-05-11 DIAGNOSIS — I10 Essential (primary) hypertension: Secondary | ICD-10-CM | POA: Diagnosis not present

## 2017-05-11 DIAGNOSIS — G4440 Drug-induced headache, not elsewhere classified, not intractable: Secondary | ICD-10-CM | POA: Diagnosis not present

## 2017-05-11 HISTORY — DX: Other muscle spasm: M62.838

## 2017-05-11 MED ORDER — EMTRICITABINE-TENOFOVIR AF 200-25 MG PO TABS: 1.0000 | ORAL_TABLET | Freq: Every day | ORAL | 11 refills | Status: DC

## 2017-05-11 MED ORDER — METOPROLOL SUCCINATE ER 50 MG PO TB24: 50.0000 mg | ORAL_TABLET | Freq: Every day | ORAL | 4 refills | Status: DC

## 2017-05-11 MED ORDER — CYCLOBENZAPRINE HCL 5 MG PO TABS: 5.0000 mg | ORAL_TABLET | Freq: Three times a day (TID) | ORAL | 1 refills | Status: DC | PRN

## 2017-05-11 MED ORDER — GABAPENTIN 300 MG PO CAPS: 300.0000 mg | ORAL_CAPSULE | Freq: Three times a day (TID) | ORAL | 1 refills | Status: DC

## 2017-05-11 MED ORDER — DARUNAVIR-COBICISTAT 800-150 MG PO TABS: 1.0000 | ORAL_TABLET | Freq: Every day | ORAL | 11 refills | Status: DC

## 2017-05-11 MED ORDER — VALACYCLOVIR HCL 1 G PO TABS: 1000.0000 mg | ORAL_TABLET | Freq: Every day | ORAL | 4 refills | Status: DC

## 2017-05-11 MED ORDER — HYDROCHLOROTHIAZIDE 25 MG PO TABS: 25.0000 mg | ORAL_TABLET | Freq: Every day | ORAL | 4 refills | Status: DC

## 2017-05-11 NOTE — Progress Notes (Signed)
Chief complaint: followup for HIV on medications, co back pain, muscle spasms  Subjective:    Patient ID: [name], female    DOB: [birth year], 51 y.o.   MRN: [id number]  HPI  51 year old African American lady with HIV and AIDS who this  once again been able to perfectly suppress her viral load to <20 on PREZCOBIX and Truvada after having failed STRIBILD with with resistanceto integrase including raltegravir and EVG but not to DTG,   She is suppressed again on Prezcobix and Descovy.  She is c/o back pain and spasm and asking for muscle relaxants along with her valtrex.  Lab Results  Component Value Date   HIV1RNAQUANT 54 (H) 06/17/2016   HIV1RNAQUANT 64 (H) 01/23/2016   HIV1RNAQUANT <20 06/25/2015   Lab Results  Component Value Date   CD4TABS 1,120 04/28/2017   CD4TABS 620 06/17/2016   CD4TABS 530 06/25/2015     Past Medical History:  Diagnosis Date  . Anemia   . GERD (gastroesophageal reflux disease)   . Headache    hx of migraines   . History of cocaine abuse   . HIV disease (HCC)   . HIV infection (HCC)   . Hypertension    not on medication   . 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 Myocardial infarction    mild MI 09/2004     no heart problems since  . 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 Shingles    2000    Past Surgical History:  Procedure Laterality Date  . ABDOMINAL HYSTERECTOMY  1993   Partial  . CESAREAN SECTION    . LAPAROSCOPIC ASSISTED VENTRAL HERNIA REPAIR N/A 01/02/2015   Procedure: LAPAROSCOPIC VENTRAL WALL HERNIA REPAIR;  Surgeon: Karie Soda, MD;  Location: [facility];  Service: General;  Laterality: N/A;  With MESH  . TONSILLECTOMY  1988    Family History  Problem Relation Age of Onset  . Diabetes type II Brother   . Migraines Neg Hx       Social History   Social History  . Marital status: Single    Spouse name: N/A  . Number of children: N/A  . Years of education: HS   Occupational  History  . disabled    Social History Main Topics  . Smoking status: Former Smoker    Types: Cigarettes  . Smokeless tobacco: Never Used  . Alcohol use Yes     Comment: rare red wine   . Drug use: Yes    Frequency: 14.0 times per week    Types: Marijuana  . Sexual activity: Not Currently     Comment: pt. declined condoms   Other Topics Concern  . Not on file   Social History Narrative  . No narrative on file    Allergies  Allergen Reactions  . Morphine Other (See Comments)    Burning under the skin  . Doxycycline Hyclate Rash     Current Outpatient Prescriptions:  .  DESCOVY 200-25 MG tablet, TAKE 1 TABLET BY MOUTH DAILY., Disp: 30 tablet, Rfl: 1 .  gabapentin (NEURONTIN) 300 MG capsule, TAKE 1 CAPSULE (300 MG TOTAL) BY MOUTH 3 (THREE) TIMES DAILY., Disp: 90 capsule, Rfl: 1 .  hydrochlorothiazide (HYDRODIURIL) 25 MG tablet, TAKE 1 TABLET (25 MG TOTAL) BY MOUTH DAILY., Disp: 90 tablet, Rfl: 0 .  metoprolol succinate (TOPROL-XL) 50 MG 24 hr tablet, TAKE 1 TABLET (50 MG TOTAL) BY MOUTH AT BEDTIME., Disp: 90 tablet, Rfl:  0 .  omeprazole (PRILOSEC) 20 MG capsule, TAKE ONE CAPSULE BY MOUTH TWICE A DAY, Disp: 180 capsule, Rfl: 0 .  PREZCOBIX 800-150 MG tablet, TAKE 1 TABLET BY MOUTH DAILY. SWALLOW WHOLE. DO NOT CRUSH, BREAK OR CHEW TABLETS. TAKE WITH FOOD., Disp: 30 tablet, Rfl: 1 .  valACYclovir (VALTREX) 1000 MG tablet, Take 1 tablet (1,000 mg total) by mouth daily., Disp: 90 tablet, Rfl: 5    Review of Systems  Constitutional: Negative for activity change, appetite change, chills, diaphoresis, fatigue, fever and unexpected weight change.  HENT: Negative for congestion, rhinorrhea, sinus pressure, sneezing, sore throat and trouble swallowing.   Eyes: Negative for photophobia and visual disturbance.  Respiratory: Negative for cough, chest tightness, shortness of breath, wheezing and stridor.   Cardiovascular: Negative for chest pain, palpitations and leg swelling.   Gastrointestinal: Negative for abdominal distention, abdominal pain, anal bleeding, blood in stool, constipation, diarrhea, nausea and vomiting.  Genitourinary: Negative for difficulty urinating, dysuria, flank pain and hematuria.  Musculoskeletal: Positive for myalgias. Negative for arthralgias, back pain, gait problem and joint swelling.  Skin: Negative for color change, pallor, rash and wound.  Neurological: Negative for dizziness, tremors, weakness and light-headedness.  Hematological: Negative for adenopathy. Does not bruise/bleed easily.  Psychiatric/Behavioral: Negative for agitation, behavioral problems, confusion, decreased concentration, dysphoric mood and sleep disturbance.       Objective:   Physical Exam  Constitutional: She is oriented to person, place, and time. She appears well-developed and well-nourished. No distress.  HENT:  Head: Normocephalic and atraumatic.  Mouth/Throat: Oropharynx is clear and moist. No oropharyngeal exudate.  Eyes: Conjunctivae and EOM are normal. Pupils are equal, round, and reactive to light. No scleral icterus.  Neck: Normal range of motion. Neck supple. No JVD present.  Cardiovascular: Normal rate, regular rhythm and normal heart sounds.   Pulmonary/Chest: Effort normal and breath sounds normal. No respiratory distress. She has no wheezes.  Abdominal: She exhibits no distension. There is no tenderness.  Musculoskeletal: She exhibits no edema or tenderness.  Lymphadenopathy:    She has no cervical adenopathy.  Neurological: She is alert and oriented to person, place, and time. She has normal reflexes. She exhibits normal muscle tone. Coordination normal.  Skin: Skin is warm and dry. She is not diaphoretic. No erythema. No pallor.  Psychiatric: She has a normal mood and affect. Her behavior is normal. Judgment and thought content normal.  Nursing note and vitals reviewed.         Assessment & Plan:  HIV continue PREZCOBIX and  DESCOVY.   rtc in 6 months  Headaches:  Being followed by Neurology   HTN: continue Toprol and HCTZ. She claims not to have taken either today  Morbid obesity: Continue weight loss efforts   HIV neuropathy: continue gabapentin  Myaglias: I wrote for flexerial for limited quantiy. This is NOT to be long term rx for her  I spent greater than 25 minutes with the patient including greater than 50% of time in face to face counsel of the patient re her HIV, HTN, neuropathy, muscle spasms and in coordination of hercare.

## 2017-06-02 ENCOUNTER — Other Ambulatory Visit: Payer: Self-pay | Admitting: Infectious Disease

## 2017-09-07 ENCOUNTER — Ambulatory Visit: Payer: Medicare Other

## 2017-09-07 ENCOUNTER — Ambulatory Visit (INDEPENDENT_AMBULATORY_CARE_PROVIDER_SITE_OTHER): Payer: Medicare Other

## 2017-09-07 DIAGNOSIS — Z23 Encounter for immunization: Secondary | ICD-10-CM

## 2017-09-09 ENCOUNTER — Telehealth: Payer: Self-pay | Admitting: *Deleted

## 2017-09-09 ENCOUNTER — Other Ambulatory Visit: Payer: Self-pay | Admitting: *Deleted

## 2017-09-09 MED ORDER — CYCLOBENZAPRINE HCL 5 MG PO TABS: 5.0000 mg | ORAL_TABLET | Freq: Three times a day (TID) | ORAL | 0 refills | Status: DC | PRN

## 2017-09-09 NOTE — Telephone Encounter (Signed)
Patient came in for flu shot on 09/07/17 and requested a muscle relaxer. [facility] he spoke to Dr. Daiva EvesVan Dam and flexeril was approved for one month only. [facility] [place] Dr. Daiva EvesVan Dam said patient would need to go to physical therapy for her leg pain. Called patient to inform her and no answer and no voice mail.

## 2017-11-02 ENCOUNTER — Other Ambulatory Visit: Payer: Medicare Other

## 2017-11-16 ENCOUNTER — Encounter: Payer: Self-pay | Admitting: Infectious Disease

## 2017-11-16 ENCOUNTER — Other Ambulatory Visit: Payer: Self-pay | Admitting: Infectious Disease

## 2017-11-16 ENCOUNTER — Ambulatory Visit (INDEPENDENT_AMBULATORY_CARE_PROVIDER_SITE_OTHER): Payer: Medicare Other | Admitting: Infectious Disease

## 2017-11-16 VITALS — BP 150/91 | HR 65 | Temp 98.1°F | Ht 65.0 in | Wt 202.0 lb

## 2017-11-16 DIAGNOSIS — M9989 Other biomechanical lesions of abdomen and other regions: Secondary | ICD-10-CM

## 2017-11-16 DIAGNOSIS — Z79899 Other long term (current) drug therapy: Secondary | ICD-10-CM | POA: Diagnosis not present

## 2017-11-16 DIAGNOSIS — Z113 Encounter for screening for infections with a predominantly sexual mode of transmission: Secondary | ICD-10-CM

## 2017-11-16 DIAGNOSIS — G63 Polyneuropathy in diseases classified elsewhere: Secondary | ICD-10-CM

## 2017-11-16 DIAGNOSIS — R221 Localized swelling, mass and lump, neck: Secondary | ICD-10-CM

## 2017-11-16 DIAGNOSIS — M62838 Other muscle spasm: Secondary | ICD-10-CM | POA: Diagnosis not present

## 2017-11-16 DIAGNOSIS — I10 Essential (primary) hypertension: Secondary | ICD-10-CM | POA: Diagnosis not present

## 2017-11-16 DIAGNOSIS — R229 Localized swelling, mass and lump, unspecified: Secondary | ICD-10-CM

## 2017-11-16 DIAGNOSIS — K219 Gastro-esophageal reflux disease without esophagitis: Secondary | ICD-10-CM

## 2017-11-16 DIAGNOSIS — B20 Human immunodeficiency virus [HIV] disease: Secondary | ICD-10-CM

## 2017-11-16 DIAGNOSIS — M999 Biomechanical lesion, unspecified: Secondary | ICD-10-CM | POA: Insufficient documentation

## 2017-11-16 HISTORY — DX: Localized swelling, mass and lump, neck: R22.1

## 2017-11-16 HISTORY — DX: Biomechanical lesion, unspecified: M99.9

## 2017-11-16 MED ORDER — CYCLOBENZAPRINE HCL 5 MG PO TABS: 5.0000 mg | ORAL_TABLET | Freq: Three times a day (TID) | ORAL | 2 refills | Status: DC | PRN

## 2017-11-16 MED ORDER — DARUN-COBIC-EMTRICIT-TENOFAF 800-150-200-10 MG PO TABS: 1.0000 | ORAL_TABLET | Freq: Every day | ORAL | 11 refills | Status: DC

## 2017-11-16 NOTE — Progress Notes (Signed)
Chief complaint: followup for HIV on medications, co muscle spasms that have worsened since she is run out of Flexeril  Subjective:    Patient ID: [name], female    DOB: 10-28-1966, 52 y.o.   MRN: [id number]  HPI  52 year old African American lady with HIV and AIDS who this  once again been able to perfectly suppress her viral load to <20 on PREZCOBIX and Truvada after having failed STRIBILD with with resistanceto integrase including raltegravir and EVG but not to DTG,   She is suppressed again on Prezcobix and Descovy.  She has not had repeat labs since the summertime but claims to be highly adherent to her medications.  [facility] and have [facility] all the components of her prior regimen.  Lab Results  Component Value Date   HIV1RNAQUANT 54 (H) 06/17/2016   HIV1RNAQUANT 64 (H) 01/23/2016   HIV1RNAQUANT <20 06/25/2015   Lab Results  Component Value Date   CD4TABS 1,120 04/28/2017   CD4TABS 620 06/17/2016   CD4TABS 530 06/25/2015   She is having muscle spasms again and requests prescription for Flexeril which I have given her.    [name] is in fairly good spirits.  She has of course struggled through the stigma of HIV disease but is really pushing into the advocacy realm trying to educate others encourage them to get tested and encouraged him to be on antiretroviral medications if they have HIV.  I have given [name] several pamphlets about preexposure prophylaxis that she can distribute to friends and others that she encounters in her efforts to spread education with regards to sexual health.  I told her we have a clinic that provides free prep to the uninsured here at our [facility].  She does suffer from grief obviously the loss of her son who was murdered in him she buried on my birthday.      Past Medical History:  Diagnosis Date  . Anemia   . GERD (gastroesophageal reflux disease)   . Headache    hx of  migraines   . History of cocaine abuse   . HIV disease (HCC)   . HIV infection (HCC)   . Hypertension    not on medication   . 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 Muscle spasm 05/11/2017  . Myocardial infarction (HCC)    mild MI 09/2004     no heart problems since  . 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 Shingles    2000    Past Surgical History:  Procedure Laterality Date  . ABDOMINAL HYSTERECTOMY  1993   Partial  . CESAREAN SECTION    . LAPAROSCOPIC ASSISTED VENTRAL HERNIA REPAIR N/A 01/02/2015   Procedure: LAPAROSCOPIC VENTRAL WALL HERNIA REPAIR;  Surgeon: Karie SodaSteven Gross, MD;  Location: [facility];  Service: General;  Laterality: N/A;  With MESH  . TONSILLECTOMY  1988    Family History  Problem Relation Age of Onset  . Diabetes type II Brother   . Migraines Neg Hx       Social History   Socioeconomic History  . Marital status: Single    Spouse name: Not on file  . Number of children: Not on file  . Years of education: HS  . Highest education level: Not on file  Social Needs  . Financial resource strain: Not on file  . Food insecurity - worry: Not on file  . Food insecurity -  inability: Not on file  . Transportation needs - medical: Not on file  . Transportation needs - non-medical: Not on file  Occupational History  . Occupation: disabled  Tobacco Use  . Smoking status: Former Smoker    Types: Cigarettes  . Smokeless tobacco: Never Used  Substance and Sexual Activity  . Alcohol use: Yes    Comment: rare red wine   . Drug use: Yes    Frequency: 14.0 times per week    Types: Marijuana  . Sexual activity: Not Currently    Comment: pt. declined condoms  Other Topics Concern  . Not on file  Social History Narrative  . Not on file    Allergies  Allergen Reactions  . Morphine Other (See Comments)    Burning under the skin  . Doxycycline Hyclate Rash     Current Outpatient Medications:  .  cyclobenzaprine (FLEXERIL) 5  MG tablet, Take 1 tablet (5 mg total) 3 (three) times daily as needed by mouth for muscle spasms., Disp: 60 tablet, Rfl: 0 .  darunavir-cobicistat (PREZCOBIX) 800-150 MG tablet, Take 1 tablet by mouth daily., Disp: 30 tablet, Rfl: 11 .  DESCOVY 200-25 MG tablet, TAKE 1 TABLET BY MOUTH EVERY DAY, Disp: 30 tablet, Rfl: 6 .  emtricitabine-tenofovir AF (DESCOVY) 200-25 MG tablet, Take 1 tablet by mouth daily., Disp: 30 tablet, Rfl: 11 .  gabapentin (NEURONTIN) 300 MG capsule, Take 1 capsule (300 mg total) by mouth 3 (three) times daily., Disp: 90 capsule, Rfl: 1 .  hydrochlorothiazide (HYDRODIURIL) 25 MG tablet, Take 1 tablet (25 mg total) by mouth daily., Disp: 90 tablet, Rfl: 4 .  metoprolol succinate (TOPROL-XL) 50 MG 24 hr tablet, Take 1 tablet (50 mg total) by mouth at bedtime., Disp: 90 tablet, Rfl: 4 .  omeprazole (PRILOSEC) 20 MG capsule, TAKE ONE CAPSULE BY MOUTH TWICE A DAY, Disp: 180 capsule, Rfl: 0 .  valACYclovir (VALTREX) 1000 MG tablet, Take 1 tablet (1,000 mg total) by mouth daily., Disp: 90 tablet, Rfl: 4    Review of Systems  Constitutional: Negative for activity change, appetite change, chills, diaphoresis, fatigue, fever and unexpected weight change.  HENT: Negative for congestion, rhinorrhea, sinus pressure, sneezing, sore throat and trouble swallowing.   Eyes: Negative for photophobia and visual disturbance.  Respiratory: Negative for cough, chest tightness, shortness of breath, wheezing and stridor.   Cardiovascular: Negative for chest pain, palpitations and leg swelling.  Gastrointestinal: Negative for abdominal distention, abdominal pain, anal bleeding, blood in stool, constipation, diarrhea, nausea and vomiting.  Genitourinary: Negative for difficulty urinating, dysuria, flank pain and hematuria.  Musculoskeletal: Positive for myalgias. Negative for arthralgias, back pain, gait problem and joint swelling.  Skin: Negative for color change, pallor, rash and wound.   Neurological: Negative for dizziness, tremors, weakness and light-headedness.  Hematological: Negative for adenopathy. Does not bruise/bleed easily.  Psychiatric/Behavioral: Negative for agitation, behavioral problems, confusion, decreased concentration, dysphoric mood and sleep disturbance.       Objective:   Physical Exam  Constitutional: She is oriented to person, place, and time. She appears well-developed and well-nourished. No distress.  HENT:  Head: Normocephalic and atraumatic.  Mouth/Throat: Oropharynx is clear and moist. No oropharyngeal exudate.  Eyes: Conjunctivae and EOM are normal. Pupils are equal, round, and reactive to light. No scleral icterus.  Neck: Normal range of motion. Neck supple. No JVD present.  Cardiovascular: Normal rate, regular rhythm and normal heart sounds.  Pulmonary/Chest: Effort normal and breath sounds normal. No respiratory distress. She has no  wheezes.  Abdominal: She exhibits no distension. There is no tenderness.  Musculoskeletal: She exhibits no edema or tenderness.  Lymphadenopathy:    She has no cervical adenopathy.  Neurological: She is alert and oriented to person, place, and time. She has normal reflexes. She exhibits normal muscle tone. Coordination normal.  Skin: Skin is warm and dry. She is not diaphoretic. No erythema. No pallor.  Psychiatric: She has a normal mood and affect. Her behavior is normal. Judgment and thought content normal.  Nursing note and vitals reviewed.         Assessment & Plan:  HIV : Check labs today and switch her to [facility]  Headaches:  Being followed by Neurology   HTN: continue Toprol and HCTZ.   There were no vitals filed for this visit.   HIV neuropathy: continue gabapentin  Myaglias: I wrote for flexeril for her again  I spent greater than 25 minutes with the patient including greater than 50% of time in face to face counsel of Jesusita (with specific regards to her interest in HIV advocacy and  peers support groups.  I have informed her of the peers support work that goes on at [facility] and she is also engaged at tried health project at [employer] she is very much interested in continuing to push and promote HIV testing and prevention and was interested in HIV preexposure prophylaxis)  and in coordination of her care.

## 2017-11-16 NOTE — Patient Instructions (Signed)
[name], we will change you to a new medicine SYMTUZA it is a COMPLETE regimen  When you start this you can stop the Prezcobix and Descovy  WE will give you more information re PrEP Pre Exposure Prophylaxis and also testing, advocacy and support groups

## 2017-11-17 LAB — COMPLETE METABOLIC PANEL WITH GFR
AG Ratio: 1.6 (calc) (ref 1.0–2.5)
ALBUMIN MSPROF: 4.3 g/dL (ref 3.6–5.1)
ALKALINE PHOSPHATASE (APISO): 95 U/L (ref 33–130)
ALT: 18 U/L (ref 6–29)
AST: 14 U/L (ref 10–35)
BUN: 14 mg/dL (ref 7–25)
CO2: 30 mmol/L (ref 20–32)
CREATININE: 1.01 mg/dL (ref 0.50–1.05)
Calcium: 9.5 mg/dL (ref 8.6–10.4)
Chloride: 103 mmol/L (ref 98–110)
GFR, EST NON AFRICAN AMERICAN: 64 mL/min/{1.73_m2} (ref >=60)
GFR, Est African American: 74 mL/min/{1.73_m2} (ref >=60)
GLUCOSE: 82 mg/dL (ref 65–99)
Globulin: 2.7 g/dL (calc) (ref 1.9–3.7)
Potassium: 4.3 mmol/L (ref 3.5–5.3)
Sodium: 141 mmol/L (ref 135–146)
Total Bilirubin: 0.5 mg/dL (ref 0.2–1.2)
Total Protein: 7 g/dL (ref 6.1–8.1)

## 2017-11-17 LAB — CBC WITH DIFFERENTIAL/PLATELET
BASOS ABS: 77 {cells}/uL (ref 0–200)
Basophils Relative: 1 %
EOS PCT: 3.8 %
Eosinophils Absolute: 293 cells/uL (ref 15–500)
HCT: 38.5 % (ref 35.0–45.0)
Hemoglobin: 12.7 g/dL (ref 11.7–15.5)
Lymphs Abs: 3904 cells/uL — ABNORMAL HIGH (ref 850–3900)
MCH: 27.8 pg (ref 27.0–33.0)
MCHC: 33 g/dL (ref 32.0–36.0)
MCV: 84.2 fL (ref 80.0–100.0)
MONOS PCT: 7.7 %
MPV: 10.3 fL (ref 7.5–12.5)
NEUTROS PCT: 36.8 %
Neutro Abs: 2834 cells/uL (ref 1500–7800)
PLATELETS: 435 10*3/uL — AB (ref 140–400)
RBC: 4.57 10*6/uL (ref 3.80–5.10)
RDW: 12.9 % (ref 11.0–15.0)
TOTAL LYMPHOCYTE: 50.7 %
WBC mixed population: 593 cells/uL (ref 200–950)
WBC: 7.7 10*3/uL (ref 3.8–10.8)

## 2017-11-17 LAB — T-HELPER CELL (CD4) - (RCID CLINIC ONLY)
CD4 % Helper T Cell: 18 % — ABNORMAL LOW (ref 33–55)
CD4 T Cell Abs: 780 /uL (ref 400–2700)

## 2017-11-17 LAB — LIPID PANEL
CHOL/HDL RATIO: 5.5 (calc) — AB (ref <5.0)
CHOLESTEROL: 264 mg/dL — AB (ref <200)
HDL: 48 mg/dL — ABNORMAL LOW (ref >50)
LDL CHOLESTEROL (CALC): 196 mg/dL — AB
Non-HDL Cholesterol (Calc): 216 mg/dL (calc) — ABNORMAL HIGH (ref <130)
TRIGLYCERIDES: 88 mg/dL (ref <150)

## 2017-11-17 LAB — RPR: RPR: NONREACTIVE

## 2017-11-19 LAB — HIV-1 RNA ULTRAQUANT REFLEX TO GENTYP+
HIV 1 RNA Quant: 23 Copies/mL — ABNORMAL HIGH
HIV-1 RNA Quant, Log: 1.37 Log cps/mL — ABNORMAL HIGH

## 2018-01-04 ENCOUNTER — Other Ambulatory Visit: Payer: Medicare Other

## 2018-01-18 ENCOUNTER — Ambulatory Visit: Payer: Medicare Other | Admitting: Infectious Disease

## 2018-01-19 ENCOUNTER — Other Ambulatory Visit: Payer: Self-pay

## 2018-01-19 ENCOUNTER — Encounter: Payer: Self-pay | Admitting: Infectious Disease

## 2018-01-19 ENCOUNTER — Ambulatory Visit (INDEPENDENT_AMBULATORY_CARE_PROVIDER_SITE_OTHER): Payer: Medicare Other | Admitting: Infectious Disease

## 2018-01-19 ENCOUNTER — Other Ambulatory Visit (HOSPITAL_COMMUNITY)
Admission: RE | Admit: 2018-01-19 | Discharge: 2018-01-19 | Disposition: A | Discharge status: Home / Self Care | Payer: Medicare Other | Source: Ambulatory Visit | Attending: Infectious Disease | Admitting: Infectious Disease

## 2018-01-19 VITALS — BP 125/80 | HR 68 | Temp 97.8°F | Ht 64.0 in | Wt 200.0 lb

## 2018-01-19 DIAGNOSIS — Z79899 Other long term (current) drug therapy: Secondary | ICD-10-CM

## 2018-01-19 DIAGNOSIS — R252 Cramp and spasm: Secondary | ICD-10-CM

## 2018-01-19 DIAGNOSIS — Z113 Encounter for screening for infections with a predominantly sexual mode of transmission: Secondary | ICD-10-CM | POA: Insufficient documentation

## 2018-01-19 DIAGNOSIS — B20 Human immunodeficiency virus [HIV] disease: Secondary | ICD-10-CM | POA: Diagnosis present

## 2018-01-19 DIAGNOSIS — I10 Essential (primary) hypertension: Secondary | ICD-10-CM

## 2018-01-19 NOTE — Progress Notes (Signed)
Chief complaint: followup for HIV on medications, co muscle spasms again and anxiety re possibility of claudication  Subjective:    Patient ID: [name], female    DOB: 12/01/1965, 52 y.o.   MRN: [id number]      52 year old [place] lady with HIV and AIDS who this  once again been able to perfectly suppress her viral load to <20 on PREZCOBIX and Truvada after having failed STRIBILD with with resistanceto integrase including raltegravir and EVG but not to DTG, N155H,S230N,D232E in 2016  She is suppressed again on Prezcobix and Descovy --> SYMTUZA    Lab Results  Component Value Date   HIV1RNAQUANT 23 (H) 11/16/2017   HIV1RNAQUANT 54 (H) 06/17/2016   HIV1RNAQUANT 64 (H) 01/23/2016   Lab Results  Component Value Date   CD4TABS 780 11/16/2017   CD4TABS 1,120 04/28/2017   CD4TABS 620 06/17/2016    [name] is c/o leg cramping and is worried about claudication. She has stopped smoking 10 years ago. She is going to seek PCP but would like to continue flexeril for muscle spasms.   Past Medical History:  Diagnosis Date  . Anemia   . GERD (gastroesophageal reflux disease)   . Headache    hx of migraines   . History of cocaine abuse   . HIV disease (HCC)   . HIV infection (HCC)   . Hypertension    not on medication   . 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 Muscle spasm 05/11/2017  . Myocardial infarction (HCC)    mild MI 09/2004     no heart problems since  . Neck swelling 11/16/2017  . 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 Nonallopathic lesion of head 11/16/2017  . Shingles    2000    Past Surgical History:  Procedure Laterality Date  . ABDOMINAL HYSTERECTOMY  1993   Partial  . CESAREAN SECTION    . LAPAROSCOPIC ASSISTED VENTRAL HERNIA REPAIR N/A 01/02/2015   Procedure: LAPAROSCOPIC VENTRAL WALL HERNIA REPAIR;  Surgeon: Karie SodaSteven Gross, MD;  Location: [facility];  Service: General;  Laterality: N/A;  With MESH  . TONSILLECTOMY   1988    Family History  Problem Relation Age of Onset  . Diabetes type II Brother   . Migraines Neg Hx       Social History   Socioeconomic History  . Marital status: Single    Spouse name: Not on file  . Number of children: Not on file  . Years of education: HS  . Highest education level: Not on file  Occupational History  . Occupation: disabled  Social Needs  . Financial resource strain: Not on file  . Food insecurity:    Worry: Not on file    Inability: Not on file  . Transportation needs:    Medical: Not on file    Non-medical: Not on file  Tobacco Use  . Smoking status: Former Smoker    Types: Cigarettes  . Smokeless tobacco: Never Used  Substance and Sexual Activity  . Alcohol use: Yes    Comment: rare red wine   . Drug use: Yes    Frequency: 14.0 times per week    Types: Marijuana  . Sexual activity: Not Currently    Comment: pt. declined condoms  Lifestyle  . Physical activity:    Days per week: Not on file    Minutes per session: Not on file  . Stress: Not on file  Relationships  .  Social connections:    Talks on phone: Not on file    Gets together: Not on file    Attends religious service: Not on file    Active member of club or organization: Not on file    Attends meetings of clubs or organizations: Not on file    Relationship status: Not on file  Other Topics Concern  . Not on file  Social History Narrative  . Not on file    Allergies  Allergen Reactions  . Morphine Other (See Comments)    Burning under the skin  . Doxycycline Hyclate Rash     Current Outpatient Medications:  .  cyclobenzaprine (FLEXERIL) 5 MG tablet, Take 1 tablet (5 mg total) by mouth 3 (three) times daily as needed for muscle spasms., Disp: 60 tablet, Rfl: 2 .  Darunavir-Cobicisctat-Emtricitabine-Tenofovir Alafenamide (SYMTUZA) 800-150-200-10 MG TABS, Take 1 tablet by mouth daily with breakfast., Disp: 30 tablet, Rfl: 11 .  gabapentin (NEURONTIN) 300 MG capsule,  Take 1 capsule (300 mg total) by mouth 3 (three) times daily., Disp: 90 capsule, Rfl: 1 .  hydrochlorothiazide (HYDRODIURIL) 25 MG tablet, Take 1 tablet (25 mg total) by mouth daily., Disp: 90 tablet, Rfl: 4 .  metoprolol succinate (TOPROL-XL) 50 MG 24 hr tablet, Take 1 tablet (50 mg total) by mouth at bedtime., Disp: 90 tablet, Rfl: 4 .  omeprazole (PRILOSEC) 20 MG capsule, TAKE ONE CAPSULE BY MOUTH TWICE A DAY, Disp: 180 capsule, Rfl: 1 .  valACYclovir (VALTREX) 1000 MG tablet, Take 1 tablet (1,000 mg total) by mouth daily., Disp: 90 tablet, Rfl: 4    Review of Systems  Constitutional: Negative for activity change, appetite change, chills, diaphoresis, fatigue, fever and unexpected weight change.  HENT: Negative for congestion, rhinorrhea, sinus pressure, sneezing, sore throat and trouble swallowing.   Eyes: Negative for photophobia and visual disturbance.  Respiratory: Negative for cough, chest tightness, shortness of breath, wheezing and stridor.   Cardiovascular: Negative for chest pain, palpitations and leg swelling.  Gastrointestinal: Negative for abdominal distention, abdominal pain, anal bleeding, blood in stool, constipation, diarrhea, nausea and vomiting.  Genitourinary: Negative for difficulty urinating, dysuria, flank pain and hematuria.  Musculoskeletal: Positive for myalgias. Negative for arthralgias, back pain, gait problem and joint swelling.  Skin: Negative for color change, pallor, rash and wound.  Neurological: Negative for dizziness, tremors, weakness and light-headedness.  Hematological: Negative for adenopathy. Does not bruise/bleed easily.  Psychiatric/Behavioral: Negative for agitation, behavioral problems, confusion, decreased concentration, dysphoric mood and sleep disturbance.       Objective:   Physical Exam  Constitutional: She is oriented to person, place, and time. She appears well-developed and well-nourished. No distress.  HENT:  Head: Normocephalic and  atraumatic.  Mouth/Throat: Oropharynx is clear and moist. No oropharyngeal exudate.  Eyes: Pupils are equal, round, and reactive to light. Conjunctivae and EOM are normal. No scleral icterus.  Neck: Normal range of motion. Neck supple. No JVD present.  Cardiovascular: Normal rate, regular rhythm and normal heart sounds.  Pulmonary/Chest: Effort normal and breath sounds normal. No respiratory distress. She has no wheezes.  Abdominal: She exhibits no distension. There is no tenderness.  Musculoskeletal: She exhibits no edema or tenderness.  Lymphadenopathy:    She has no cervical adenopathy.  Neurological: She is alert and oriented to person, place, and time. She has normal reflexes. She exhibits normal muscle tone. Coordination normal.  Skin: Skin is warm and dry. She is not diaphoretic. No erythema. No pallor.  Psychiatric: She has a  normal mood and affect. Her behavior is normal. Judgment and thought content normal.  Nursing note and vitals reviewed.         Assessment & Plan:  HIV : Check labs today and continue SYMTUZA  Headaches:  Being followed by Neurology   HTN: continue Toprol and HCTZ. And much better controlled  Vitals:   01/19/18 1448  BP: 125/80  Pulse: 68  Temp: 97.8 F (36.6 C)   Myaglias: I wrote for flexeril for her again  I spent greater than 25 minutes with the patient including greater than 50% of time in face to face counsel of the Sariya re HIV "treatment as prefention" re PrEP, her efforts to counsel prostittutes ("street walkers") re HIV risk and ways to prevent this, her woman's support groups in [employer] and in coordination of her  care.

## 2018-01-20 LAB — T-HELPER CELL (CD4) - (RCID CLINIC ONLY)
CD4 T CELL HELPER: 19 % — AB (ref 33–55)
CD4 T Cell Abs: 870 /uL (ref 400–2700)

## 2018-01-20 LAB — LIPID PANEL
CHOL/HDL RATIO: 7 (calc) — AB (ref <5.0)
CHOLESTEROL: 266 mg/dL — AB (ref <200)
HDL: 38 mg/dL — ABNORMAL LOW (ref >50)
LDL Cholesterol (Calc): 189 mg/dL (calc) — ABNORMAL HIGH
Non-HDL Cholesterol (Calc): 228 mg/dL (calc) — ABNORMAL HIGH (ref <130)
Triglycerides: 209 mg/dL — ABNORMAL HIGH (ref <150)

## 2018-01-20 LAB — CBC WITH DIFFERENTIAL/PLATELET
BASOS ABS: 79 {cells}/uL (ref 0–200)
Basophils Relative: 0.9 %
EOS ABS: 440 {cells}/uL (ref 15–500)
Eosinophils Relative: 5 %
HCT: 39.5 % (ref 35.0–45.0)
Hemoglobin: 13 g/dL (ref 11.7–15.5)
Lymphs Abs: 4418 cells/uL — ABNORMAL HIGH (ref 850–3900)
MCH: 27.9 pg (ref 27.0–33.0)
MCHC: 32.9 g/dL (ref 32.0–36.0)
MCV: 84.8 fL (ref 80.0–100.0)
MONOS PCT: 6.6 %
MPV: 10.5 fL (ref 7.5–12.5)
Neutro Abs: 3282 cells/uL (ref 1500–7800)
Neutrophils Relative %: 37.3 %
PLATELETS: 400 10*3/uL (ref 140–400)
RBC: 4.66 10*6/uL (ref 3.80–5.10)
RDW: 13.8 % (ref 11.0–15.0)
TOTAL LYMPHOCYTE: 50.2 %
WBC mixed population: 581 cells/uL (ref 200–950)
WBC: 8.8 10*3/uL (ref 3.8–10.8)

## 2018-01-20 LAB — URINE CYTOLOGY ANCILLARY ONLY
Chlamydia: NEGATIVE
NEISSERIA GONORRHEA: NEGATIVE

## 2018-01-20 LAB — COMPLETE METABOLIC PANEL WITH GFR
AG RATIO: 1.5 (calc) (ref 1.0–2.5)
ALT: 16 U/L (ref 6–29)
AST: 16 U/L (ref 10–35)
Albumin: 4.2 g/dL (ref 3.6–5.1)
Alkaline phosphatase (APISO): 92 U/L (ref 33–130)
BILIRUBIN TOTAL: 0.3 mg/dL (ref 0.2–1.2)
BUN/Creatinine Ratio: 12 (calc) (ref 6–22)
BUN: 14 mg/dL (ref 7–25)
CALCIUM: 9.5 mg/dL (ref 8.6–10.4)
CHLORIDE: 105 mmol/L (ref 98–110)
CO2: 27 mmol/L (ref 20–32)
Creat: 1.18 mg/dL — ABNORMAL HIGH (ref 0.50–1.05)
GFR, EST AFRICAN AMERICAN: 61 mL/min/{1.73_m2} (ref >=60)
GFR, EST NON AFRICAN AMERICAN: 53 mL/min/{1.73_m2} — AB (ref >=60)
GLOBULIN: 2.8 g/dL (ref 1.9–3.7)
Glucose, Bld: 73 mg/dL (ref 65–99)
POTASSIUM: 4.2 mmol/L (ref 3.5–5.3)
SODIUM: 140 mmol/L (ref 135–146)
TOTAL PROTEIN: 7 g/dL (ref 6.1–8.1)

## 2018-01-20 LAB — MICROALBUMIN / CREATININE URINE RATIO
CREATININE, URINE: 227 mg/dL (ref 20–275)
Microalb Creat Ratio: 4 mcg/mg creat (ref <30)
Microalb, Ur: 0.9 mg/dL

## 2018-01-20 LAB — RPR: RPR: NONREACTIVE

## 2018-01-21 LAB — HIV-1 RNA QUANT-NO REFLEX-BLD
HIV 1 RNA QUANT: NOT DETECTED {copies}/mL
HIV-1 RNA Quant, Log: <1.3 Log copies/mL

## 2018-05-27 ENCOUNTER — Other Ambulatory Visit: Payer: Self-pay | Admitting: Infectious Disease

## 2018-05-27 DIAGNOSIS — G629 Polyneuropathy, unspecified: Secondary | ICD-10-CM

## 2018-07-12 ENCOUNTER — Other Ambulatory Visit: Payer: Medicare Other

## 2018-07-26 ENCOUNTER — Other Ambulatory Visit: Payer: Self-pay | Admitting: Behavioral Health

## 2018-07-26 ENCOUNTER — Encounter: Payer: Medicare Other | Admitting: Infectious Disease

## 2018-07-26 DIAGNOSIS — R252 Cramp and spasm: Secondary | ICD-10-CM

## 2018-07-26 MED ORDER — CYCLOBENZAPRINE HCL 5 MG PO TABS: 5.0000 mg | ORAL_TABLET | Freq: Three times a day (TID) | ORAL | 0 refills | Status: DC | PRN

## 2018-07-31 ENCOUNTER — Other Ambulatory Visit: Payer: Self-pay | Admitting: Infectious Disease

## 2018-08-01 ENCOUNTER — Other Ambulatory Visit: Payer: Self-pay | Admitting: Infectious Disease

## 2018-08-01 DIAGNOSIS — I10 Essential (primary) hypertension: Secondary | ICD-10-CM

## 2018-08-16 ENCOUNTER — Telehealth: Payer: Self-pay

## 2018-08-16 NOTE — Telephone Encounter (Signed)
Patient called today stating she is experiencing flu like symptoms and would like something called into pharmacy listed in her chart. Patient states she has been feeling symptoms for two days and has been unable to get out of bed. Patient refused appointment to come into clinic today as sick visit due to her commute from [facility] to our office. Advise patient to go to Urgent care near her home to be evaluated, but will also route message to MD. Lorenso Courier, CMA

## 2018-08-17 NOTE — Telephone Encounter (Signed)
There is no indication for emprirc treatment of flu here. She should continue Symptomatic treatment but should she not improve then see care and evaluation at urgent care

## 2018-09-01 ENCOUNTER — Other Ambulatory Visit: Payer: Self-pay | Admitting: Infectious Disease

## 2018-09-01 DIAGNOSIS — K219 Gastro-esophageal reflux disease without esophagitis: Secondary | ICD-10-CM

## 2018-09-04 ENCOUNTER — Other Ambulatory Visit: Payer: Self-pay

## 2018-09-04 ENCOUNTER — Encounter: Payer: Self-pay | Admitting: Infectious Disease

## 2018-09-04 ENCOUNTER — Ambulatory Visit (INDEPENDENT_AMBULATORY_CARE_PROVIDER_SITE_OTHER): Payer: Medicare Other

## 2018-09-04 DIAGNOSIS — R252 Cramp and spasm: Secondary | ICD-10-CM

## 2018-09-04 DIAGNOSIS — Z23 Encounter for immunization: Secondary | ICD-10-CM

## 2018-09-04 MED ORDER — CYCLOBENZAPRINE HCL 5 MG PO TABS: 5.0000 mg | ORAL_TABLET | Freq: Three times a day (TID) | ORAL | 0 refills | Status: DC | PRN

## 2018-09-04 NOTE — Progress Notes (Deleted)
Patient received Flu vaccine today. Tolerated well. Reschedule appt with Dr. Daiva Eves on 09/11/18 at 11:30am.  Medication refill for Flexeril

## 2018-09-11 ENCOUNTER — Ambulatory Visit: Payer: Medicare Other | Admitting: Infectious Disease

## 2018-10-17 ENCOUNTER — Telehealth: Payer: Self-pay

## 2018-10-17 NOTE — Telephone Encounter (Signed)
Patient information came up on overdue pap list. If patient has not had a pap smear within this year she will be able to see Rexene AlbertsStephanie Dixon, Np for pap smear. Attempted to call patient, however call was not answered. Unable to leave message at this time.  Lorenso CourierJose L Jacklyn Branan, [place]

## 2018-11-08 NOTE — Progress Notes (Deleted)
Flu shot

## 2018-11-25 ENCOUNTER — Other Ambulatory Visit: Payer: Self-pay | Admitting: Infectious Disease

## 2018-11-25 DIAGNOSIS — B20 Human immunodeficiency virus [HIV] disease: Secondary | ICD-10-CM

## 2019-01-01 ENCOUNTER — Other Ambulatory Visit: Payer: Self-pay | Admitting: Infectious Disease

## 2019-01-01 DIAGNOSIS — B20 Human immunodeficiency virus [HIV] disease: Secondary | ICD-10-CM

## 2019-01-08 ENCOUNTER — Other Ambulatory Visit: Payer: Medicare Other

## 2019-01-08 ENCOUNTER — Other Ambulatory Visit (HOSPITAL_COMMUNITY)
Admission: RE | Admit: 2019-01-08 | Discharge: 2019-01-08 | Disposition: A | Discharge status: Home / Self Care | Payer: Medicare Other | Source: Ambulatory Visit | Attending: Infectious Disease | Admitting: Infectious Disease

## 2019-01-08 ENCOUNTER — Telehealth: Payer: Self-pay

## 2019-01-08 ENCOUNTER — Other Ambulatory Visit: Payer: Self-pay

## 2019-01-08 DIAGNOSIS — Z79899 Other long term (current) drug therapy: Secondary | ICD-10-CM

## 2019-01-08 DIAGNOSIS — B20 Human immunodeficiency virus [HIV] disease: Secondary | ICD-10-CM

## 2019-01-08 DIAGNOSIS — Z113 Encounter for screening for infections with a predominantly sexual mode of transmission: Secondary | ICD-10-CM | POA: Insufficient documentation

## 2019-01-08 DIAGNOSIS — R252 Cramp and spasm: Secondary | ICD-10-CM

## 2019-01-08 DIAGNOSIS — A539 Syphilis, unspecified: Secondary | ICD-10-CM

## 2019-01-08 MED ORDER — CYCLOBENZAPRINE HCL 5 MG PO TABS: 5.0000 mg | ORAL_TABLET | Freq: Three times a day (TID) | ORAL | 0 refills | Status: DC | PRN

## 2019-01-08 NOTE — Telephone Encounter (Signed)
[name] walked in to office complaining of pain in the hips, legs, and feet.   Suggested seeking care at an Urgent Care, and to become established with a primary doctor.  [name] asked for at least a refill on her Flexeril to make it to next visit with primary care or with Dr.Van Dam.  Patient is also due for labs and follow up with Dr. Daiva Eves.   I agreed to refill 1 month supply, provided information for primary care. [name] agreed to get labs done today and has scheduled a follow up appointment today as well.   Patient asked for letter dismissing her from work for yesterday (01/08/2019) and today (01/08/2019), and to return until tomorrow to work.   [name], [place]

## 2019-01-09 LAB — T-HELPER CELL (CD4) - (RCID CLINIC ONLY)
CD4 % Helper T Cell: 23 % — ABNORMAL LOW (ref 33–55)
CD4 T Cell Abs: 850 /uL (ref 400–2700)

## 2019-01-09 LAB — URINE CYTOLOGY ANCILLARY ONLY
CHLAMYDIA, DNA PROBE: NEGATIVE
Neisseria Gonorrhea: NEGATIVE

## 2019-01-10 LAB — HIV-1 RNA QUANT-NO REFLEX-BLD
HIV 1 RNA Quant: <20 copies/mL — AB
HIV-1 RNA Quant, Log: <1.3 Log copies/mL — AB

## 2019-01-10 LAB — CBC WITH DIFFERENTIAL/PLATELET
Absolute Monocytes: 592 cells/uL (ref 200–950)
Basophils Absolute: 91 cells/uL (ref 0–200)
Basophils Relative: 1 %
EOS PCT: 4.5 %
Eosinophils Absolute: 410 cells/uL (ref 15–500)
HCT: 41.2 % (ref 35.0–45.0)
Hemoglobin: 13.6 g/dL (ref 11.7–15.5)
Lymphs Abs: 3667 cells/uL (ref 850–3900)
MCH: 28.1 pg (ref 27.0–33.0)
MCHC: 33 g/dL (ref 32.0–36.0)
MCV: 85.1 fL (ref 80.0–100.0)
MPV: 10.7 fL (ref 7.5–12.5)
Monocytes Relative: 6.5 %
Neutro Abs: 4341 cells/uL (ref 1500–7800)
Neutrophils Relative %: 47.7 %
PLATELETS: 405 10*3/uL — AB (ref 140–400)
RBC: 4.84 10*6/uL (ref 3.80–5.10)
RDW: 13.2 % (ref 11.0–15.0)
Total Lymphocyte: 40.3 %
WBC: 9.1 10*3/uL (ref 3.8–10.8)

## 2019-01-10 LAB — LIPID PANEL
Cholesterol: 282 mg/dL — ABNORMAL HIGH (ref <200)
HDL: 48 mg/dL — ABNORMAL LOW (ref >=50)
LDL Cholesterol (Calc): 211 mg/dL (calc) — ABNORMAL HIGH
Non-HDL Cholesterol (Calc): 234 mg/dL (calc) — ABNORMAL HIGH (ref <130)
TRIGLYCERIDES: 103 mg/dL (ref <150)
Total CHOL/HDL Ratio: 5.9 (calc) — ABNORMAL HIGH (ref <5.0)

## 2019-01-10 LAB — COMPREHENSIVE METABOLIC PANEL
AG Ratio: 1.6 (calc) (ref 1.0–2.5)
ALT: 21 U/L (ref 6–29)
AST: 15 U/L (ref 10–35)
Albumin: 4.4 g/dL (ref 3.6–5.1)
Alkaline phosphatase (APISO): 97 U/L (ref 37–153)
BILIRUBIN TOTAL: 0.4 mg/dL (ref 0.2–1.2)
BUN/Creatinine Ratio: 13 (calc) (ref 6–22)
BUN: 14 mg/dL (ref 7–25)
CALCIUM: 9.6 mg/dL (ref 8.6–10.4)
CHLORIDE: 104 mmol/L (ref 98–110)
CO2: 28 mmol/L (ref 20–32)
Creat: 1.08 mg/dL — ABNORMAL HIGH (ref 0.50–1.05)
Globulin: 2.8 g/dL (calc) (ref 1.9–3.7)
Glucose, Bld: 71 mg/dL (ref 65–99)
Potassium: 4.2 mmol/L (ref 3.5–5.3)
Sodium: 140 mmol/L (ref 135–146)
Total Protein: 7.2 g/dL (ref 6.1–8.1)

## 2019-01-10 LAB — RPR: RPR Ser Ql: NONREACTIVE

## 2019-01-23 ENCOUNTER — Ambulatory Visit (INDEPENDENT_AMBULATORY_CARE_PROVIDER_SITE_OTHER): Payer: Medicare Other | Admitting: Infectious Disease

## 2019-01-23 ENCOUNTER — Other Ambulatory Visit: Payer: Self-pay

## 2019-01-23 VITALS — BP 172/102 | HR 69 | Ht 64.0 in | Wt 217.0 lb

## 2019-01-23 DIAGNOSIS — Z113 Encounter for screening for infections with a predominantly sexual mode of transmission: Secondary | ICD-10-CM | POA: Diagnosis not present

## 2019-01-23 DIAGNOSIS — G63 Polyneuropathy in diseases classified elsewhere: Secondary | ICD-10-CM

## 2019-01-23 DIAGNOSIS — R252 Cramp and spasm: Secondary | ICD-10-CM

## 2019-01-23 DIAGNOSIS — Z79899 Other long term (current) drug therapy: Secondary | ICD-10-CM

## 2019-01-23 DIAGNOSIS — G629 Polyneuropathy, unspecified: Secondary | ICD-10-CM

## 2019-01-23 DIAGNOSIS — B20 Human immunodeficiency virus [HIV] disease: Secondary | ICD-10-CM

## 2019-01-23 DIAGNOSIS — Z23 Encounter for immunization: Secondary | ICD-10-CM | POA: Diagnosis not present

## 2019-01-23 DIAGNOSIS — I10 Essential (primary) hypertension: Secondary | ICD-10-CM

## 2019-01-23 MED ORDER — DARUN-COBIC-EMTRICIT-TENOFAF 800-150-200-10 MG PO TABS: 1.0000 | ORAL_TABLET | Freq: Every day | ORAL | 11 refills | Status: DC

## 2019-01-23 MED ORDER — CYCLOBENZAPRINE HCL 5 MG PO TABS: 5.0000 mg | ORAL_TABLET | Freq: Three times a day (TID) | ORAL | Status: DC | PRN

## 2019-01-23 MED ORDER — VALACYCLOVIR HCL 1 G PO TABS: 1000.0000 mg | ORAL_TABLET | Freq: Every day | ORAL | 11 refills | Status: DC

## 2019-01-23 MED ORDER — HYDROCHLOROTHIAZIDE 25 MG PO TABS: 25.0000 mg | ORAL_TABLET | Freq: Every day | ORAL | 11 refills | Status: DC

## 2019-01-23 MED ORDER — GABAPENTIN 300 MG PO CAPS: ORAL_CAPSULE | ORAL | 1 refills | Status: DC

## 2019-01-23 NOTE — Progress Notes (Signed)
Patient received menveo #1 during visit today and tolerated well. Verbal order for prevnar-13 but vaccine currently not available in the office.  Valarie Cones, LPN

## 2019-01-23 NOTE — Progress Notes (Signed)
Chief complaint: [name] is complaining of knee pain worse on the left than the right Subjective:    Patient ID: [name], female    DOB: 1966-08-16, 53 y.o.   MRN: [id number]      49 y ear old [place] American lady with HIV and AIDS who this  once again been able to perfectly suppress her viral load to <20 on PREZCOBIX and Truvada after having failed STRIBILD with with resistanceto integrase including raltegravir and EVG but not to DTG, N155H,S230N,D232E in 2016  She is suppressed again on Prezcobix and Descovy --> SYMTUZA Is once again complaining about leg cramping and also knee pain.  She says Needs muscle relaxants to help manage this    Past Medical History:  Diagnosis Date  . Anemia   . GERD (gastroesophageal reflux disease)   . Headache    hx of migraines   . History of cocaine abuse   . HIV disease (HCC)   . HIV infection (HCC)   . Hypertension    not on medication   . 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 Muscle spasm 05/11/2017  . Myocardial infarction (HCC)    mild MI 09/2004     no heart problems since  . Neck swelling 11/16/2017  . 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 Nonallopathic lesion of head 11/16/2017  . Shingles    2000    Past Surgical History:  Procedure Laterality Date  . ABDOMINAL HYSTERECTOMY  1993   Partial  . CESAREAN SECTION    . LAPAROSCOPIC ASSISTED VENTRAL HERNIA REPAIR N/A 01/02/2015   Procedure: LAPAROSCOPIC VENTRAL WALL HERNIA REPAIR;  Surgeon: Karie Soda, MD;  Location: [facility];  Service: General;  Laterality: N/A;  With MESH  . TONSILLECTOMY  1988    Family History  Problem Relation Age of Onset  . Diabetes type II Brother   . Migraines Neg Hx       Social History   Socioeconomic History  . Marital status: Single    Spouse name: Not on file  . Number of children: Not on file  . Years of education: HS  . Highest education level: Not on file  Occupational History  . Occupation:  disabled  Social Needs  . Financial resource strain: Not on file  . Food insecurity:    Worry: Not on file    Inability: Not on file  . Transportation needs:    Medical: Not on file    Non-medical: Not on file  Tobacco Use  . Smoking status: Former Smoker    Types: Cigarettes  . Smokeless tobacco: Never Used  Substance and Sexual Activity  . Alcohol use: Yes    Comment: rare red wine   . Drug use: Yes    Frequency: 14.0 times per week    Types: Marijuana  . Sexual activity: Not Currently    Comment: pt. declined condoms  Lifestyle  . Physical activity:    Days per week: Not on file    Minutes per session: Not on file  . Stress: Not on file  Relationships  . Social connections:    Talks on phone: Not on file    Gets together: Not on file    Attends religious service: Not on file    Active member of club or organization: Not on file    Attends meetings of clubs or organizations: Not on file    Relationship status: Not on file  Other Topics Concern  .  Not on file  Social History Narrative  . Not on file    Allergies  Allergen Reactions  . Morphine Other (See Comments)    Burning under the skin  . Doxycycline Hyclate Rash     Current Outpatient Medications:  .  cyclobenzaprine (FLEXERIL) 5 MG tablet, Take 1 tablet (5 mg total) by mouth 3 (three) times daily as needed for muscle spasms., Disp: 60 tablet, Rfl: 0 .  gabapentin (NEURONTIN) 300 MG capsule, TAKE 1 CAPSULE BY MOUTH THREE TIMES A DAY, Disp: 270 capsule, Rfl: 1 .  hydrochlorothiazide (HYDRODIURIL) 25 MG tablet, Take 1 tablet (25 mg total) by mouth daily., Disp: 90 tablet, Rfl: 4 .  metoprolol succinate (TOPROL-XL) 50 MG 24 hr tablet, TAKE 1 TABLET BY MOUTH EVERYDAY AT BEDTIME, Disp: 90 tablet, Rfl: 1 .  omeprazole (PRILOSEC) 20 MG capsule, TAKE ONE CAPSULE BY MOUTH TWICE A DAY, Disp: 180 capsule, Rfl: 1 .  SYMTUZA 800-150-200-10 MG TABS, TAKE 1 TABLET BY MOUTH EVERY DAY WITH BREAKFAST, Disp: 30 tablet, Rfl:  6 .  valACYclovir (VALTREX) 1000 MG tablet, TAKE 1 TABLET (1,000 MG TOTAL) BY MOUTH DAILY., Disp: 90 tablet, Rfl: 4    Review of Systems  Constitutional: Negative for activity change, appetite change, chills, diaphoresis, fatigue, fever and unexpected weight change.  HENT: Negative for congestion, rhinorrhea, sinus pressure, sneezing, sore throat and trouble swallowing.   Eyes: Negative for photophobia and visual disturbance.  Respiratory: Negative for cough, chest tightness, shortness of breath, wheezing and stridor.   Cardiovascular: Negative for chest pain, palpitations and leg swelling.  Gastrointestinal: Negative for abdominal distention, abdominal pain, anal bleeding, blood in stool, constipation, diarrhea, nausea and vomiting.  Genitourinary: Negative for difficulty urinating, dysuria, flank pain, genital sores and hematuria.  Musculoskeletal: Positive for arthralgias and myalgias. Negative for back pain, gait problem and joint swelling.  Skin: Negative for color change, pallor, rash and wound.  Neurological: Negative for dizziness, tremors, weakness and light-headedness.  Hematological: Negative for adenopathy. Does not bruise/bleed easily.  Psychiatric/Behavioral: Negative for agitation, behavioral problems, confusion, decreased concentration, dysphoric mood and sleep disturbance.       Objective:   Physical Exam Vitals signs and nursing note reviewed.  Constitutional:      General: She is not in acute distress.    Appearance: She is well-developed. She is not diaphoretic.  HENT:     Head: Normocephalic and atraumatic.     Mouth/Throat:     Pharynx: No oropharyngeal exudate.  Eyes:     General: No scleral icterus.       Right eye: No discharge.        Left eye: No discharge.     Conjunctiva/sclera: Conjunctivae normal.     Pupils: Pupils are equal, round, and reactive to light.  Neck:     Musculoskeletal: Normal range of motion and neck supple.     Vascular: No JVD.   Cardiovascular:     Rate and Rhythm: Normal rate and regular rhythm.     Heart sounds: Normal heart sounds.  Pulmonary:     Effort: Pulmonary effort is normal. No respiratory distress.     Breath sounds: Normal breath sounds. No wheezing.  Abdominal:     General: There is no distension.     Tenderness: There is no abdominal tenderness.  Musculoskeletal:        General: No tenderness.  Lymphadenopathy:     Cervical: No cervical adenopathy.  Skin:    General: Skin is warm and dry.  Coloration: Skin is not pale.     Findings: No erythema.  Neurological:     Mental Status: She is alert and oriented to person, place, and time.     Motor: No abnormal muscle tone.     Coordination: Coordination normal.     Deep Tendon Reflexes: Reflexes are normal and symmetric.  Psychiatric:        Behavior: Behavior normal.        Thought Content: Thought content normal.        Judgment: Judgment normal.           Assessment & Plan:  HIV :  continue SYMTUZA  Headaches:  Being followed by Neurology   HTN: continue Toprol and HCTZ.  Unfortunately I do not know why she did not take her blood pressure medicines today  There were no vitals filed for this visit. Myaglias: I wrote for flexeril for her again  I spent greater than 25 minutes with the patient including greater than 50% of time in face to face counsel of the patient  how to socially distance her foot self avoid other people other critical measures to avoid novel coronavirus 2019 and in coordination of her care.

## 2019-01-23 NOTE — Addendum Note (Signed)
Addended by: Valarie Cones on: 01/23/2019 09:35 AM   Modules accepted: Orders

## 2019-04-23 ENCOUNTER — Other Ambulatory Visit: Payer: Self-pay | Admitting: Infectious Disease

## 2019-04-23 DIAGNOSIS — I10 Essential (primary) hypertension: Secondary | ICD-10-CM

## 2019-06-20 ENCOUNTER — Other Ambulatory Visit: Payer: Self-pay | Admitting: Infectious Disease

## 2019-06-20 DIAGNOSIS — K219 Gastro-esophageal reflux disease without esophagitis: Secondary | ICD-10-CM

## 2019-06-25 ENCOUNTER — Other Ambulatory Visit: Payer: Self-pay | Admitting: Infectious Disease

## 2019-06-25 DIAGNOSIS — R252 Cramp and spasm: Secondary | ICD-10-CM

## 2019-07-20 ENCOUNTER — Other Ambulatory Visit: Payer: Self-pay

## 2019-07-20 DIAGNOSIS — K219 Gastro-esophageal reflux disease without esophagitis: Secondary | ICD-10-CM

## 2019-07-20 MED ORDER — OMEPRAZOLE 20 MG PO CPDR: 20.0000 mg | DELAYED_RELEASE_CAPSULE | Freq: Two times a day (BID) | ORAL | 2 refills | Status: DC

## 2019-07-26 ENCOUNTER — Other Ambulatory Visit: Payer: Medicare Other

## 2019-08-20 ENCOUNTER — Encounter: Payer: Medicare Other | Admitting: Infectious Disease

## 2019-08-28 ENCOUNTER — Ambulatory Visit (INDEPENDENT_AMBULATORY_CARE_PROVIDER_SITE_OTHER): Payer: Medicare Other | Admitting: Infectious Disease

## 2019-08-28 ENCOUNTER — Other Ambulatory Visit: Payer: Self-pay

## 2019-08-28 VITALS — BP 136/82 | HR 66

## 2019-08-28 DIAGNOSIS — R252 Cramp and spasm: Secondary | ICD-10-CM | POA: Diagnosis not present

## 2019-08-28 DIAGNOSIS — B20 Human immunodeficiency virus [HIV] disease: Secondary | ICD-10-CM | POA: Diagnosis not present

## 2019-08-28 DIAGNOSIS — I10 Essential (primary) hypertension: Secondary | ICD-10-CM | POA: Diagnosis not present

## 2019-08-28 DIAGNOSIS — Z23 Encounter for immunization: Secondary | ICD-10-CM | POA: Diagnosis not present

## 2019-08-28 MED ORDER — CYCLOBENZAPRINE HCL 5 MG PO TABS: ORAL_TABLET | ORAL | 4 refills | Status: DC

## 2019-08-28 MED ORDER — SYMTUZA 800-150-200-10 MG PO TABS: 1.0000 | ORAL_TABLET | Freq: Every day | ORAL | 11 refills | Status: DC

## 2019-08-28 NOTE — Progress Notes (Signed)
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 Subjective:    Patient ID: [name], female    DOB: 1966-03-08, 53 y.o.   MRN: [id number]      [age] y ear old [place] lady with HIV and AIDS who this  once again been able to perfectly suppress her viral load to <20 on PREZCOBIX and Truvada after having failed STRIBILD with with resistanceto integrase including raltegravir and EVG but not to DTG, N155H,S230N,D232E in 2016  She is suppressed again on Prezcobix and Descovy --> SYMTUZA  She has done well on Symtuza and was suppressed in March when checked her labs.  Today she did not want to have blood work done for some reason.  She was anxious to get her flu shot.  She did complaining of knee pain and again asked for prescription for Flexeril.  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    Past Medical History:  Diagnosis Date  . Anemia   . GERD (gastroesophageal reflux disease)   . Headache    hx of migraines   . History of cocaine abuse   . HIV disease (HCC)   . HIV infection (HCC)   . Hypertension    not on medication   . 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 Muscle spasm 05/11/2017  . Myocardial infarction (HCC)    mild MI 09/2004     no heart problems since  . Neck swelling 11/16/2017  . 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 Nonallopathic lesion of head 11/16/2017  . Shingles    2000    Past Surgical History:  Procedure Laterality Date  . ABDOMINAL HYSTERECTOMY  1993   Partial  . CESAREAN SECTION    . LAPAROSCOPIC ASSISTED VENTRAL HERNIA REPAIR N/A 01/02/2015   Procedure: LAPAROSCOPIC VENTRAL WALL HERNIA  REPAIR;  Surgeon: Karie SodaSteven Gross, MD;  Location: [facility];  Service: General;  Laterality: N/A;  With MESH  . TONSILLECTOMY  1988    Family History  Problem Relation Age of Onset  . Diabetes type II Brother   . Migraines Neg Hx       Social History   Socioeconomic History  . Marital status: Single    Spouse name: Not on file  . Number of children: Not on file  . Years of education: HS  . Highest education level: Not on file  Occupational History  . Occupation: disabled  Social Needs  . Financial resource strain: Not on file  . Food insecurity    Worry: Not on file    Inability: Not on file  . Transportation needs    Medical: Not on file    Non-medical: Not on file  Tobacco Use  . Smoking status: Former Smoker    Types: Cigarettes  . Smokeless tobacco: Never Used  Substance and Sexual Activity  . Alcohol use: Yes    Comment: rare red wine   . Drug use: Yes    Frequency: 14.0 times per week    Types: Marijuana  . Sexual activity: Not Currently    Comment: pt. declined condoms  Lifestyle  . Physical activity  Days per week: Not on file    Minutes per session: Not on file  . Stress: Not on file  Relationships  . Social Herbalist on phone: Not on file    Gets together: Not on file    Attends religious service: Not on file    Active member of club or organization: Not on file    Attends meetings of clubs or organizations: Not on file    Relationship status: Not on file  Other Topics Concern  . Not on file  Social History Narrative  . Not on file    Allergies  Allergen Reactions  . Morphine Other (See Comments)    Burning under the skin  . Doxycycline Hyclate Rash     Current Outpatient Medications:  .  cyclobenzaprine (FLEXERIL) 5 MG tablet, TAKE 1 TABLET BY MOUTH THREE TIMES A DAY AS NEEDED FOR MUSCLE SPASMS, Disp: 60 tablet, Rfl: 4 .  Darunavir-Cobicisctat-Emtricitabine-Tenofovir Alafenamide (SYMTUZA) 800-150-200-10 MG TABS, Take 1 tablet by  mouth daily with breakfast., Disp: 30 tablet, Rfl: 11 .  gabapentin (NEURONTIN) 300 MG capsule, TAKE 1 CAPSULE BY MOUTH THREE TIMES A DAY, Disp: 270 capsule, Rfl: 1 .  hydrochlorothiazide (HYDRODIURIL) 25 MG tablet, Take 1 tablet (25 mg total) by mouth daily., Disp: 90 tablet, Rfl: 11 .  metoprolol succinate (TOPROL-XL) 50 MG 24 hr tablet, TAKE 1 TABLET BY MOUTH EVERYDAY AT BEDTIME, Disp: 90 tablet, Rfl: 1 .  omeprazole (PRILOSEC) 20 MG capsule, Take 1 capsule (20 mg total) by mouth 2 (two) times daily., Disp: 180 capsule, Rfl: 2 .  valACYclovir (VALTREX) 1000 MG tablet, Take 1 tablet (1,000 mg total) by mouth daily., Disp: 90 tablet, Rfl: 11    Review of Systems  Constitutional: Negative for activity change, appetite change, chills, diaphoresis, fatigue, fever and unexpected weight change.  HENT: Negative for congestion, rhinorrhea, sinus pressure, sneezing, sore throat and trouble swallowing.   Eyes: Negative for photophobia and visual disturbance.  Respiratory: Negative for cough, chest tightness, shortness of breath, wheezing and stridor.   Cardiovascular: Negative for chest pain, palpitations and leg swelling.  Gastrointestinal: Negative for abdominal distention, abdominal pain, anal bleeding, blood in stool, constipation, diarrhea, nausea and vomiting.  Genitourinary: Negative for difficulty urinating, dysuria, flank pain, genital sores and hematuria.  Musculoskeletal: Positive for arthralgias and myalgias. Negative for back pain, gait problem and joint swelling.  Skin: Negative for color change, pallor, rash and wound.  Neurological: Negative for dizziness, tremors, weakness and light-headedness.  Hematological: Negative for adenopathy. Does not bruise/bleed easily.  Psychiatric/Behavioral: Negative for agitation, behavioral problems, confusion, decreased concentration, dysphoric mood, sleep disturbance and suicidal ideas. The patient is not hyperactive.        Objective:   Physical  Exam Vitals signs and nursing note reviewed.  Constitutional:      General: She is not in acute distress.    Appearance: She is well-developed. She is not diaphoretic.  HENT:     Head: Normocephalic and atraumatic.     Mouth/Throat:     Pharynx: No oropharyngeal exudate.  Eyes:     General: No scleral icterus.       Right eye: No discharge.        Left eye: No discharge.     Conjunctiva/sclera: Conjunctivae normal.     Pupils: Pupils are equal, round, and reactive to light.  Neck:     Musculoskeletal: Normal range of motion and neck supple.     Vascular: No JVD.  Cardiovascular:     Rate and Rhythm: Normal rate and regular rhythm.     Heart sounds: Normal heart sounds.  Pulmonary:     Effort: Pulmonary effort is normal. No respiratory distress.     Breath sounds: Normal breath sounds. No wheezing.  Abdominal:     General: There is no distension.     Tenderness: There is no abdominal tenderness.  Musculoskeletal:        General: No tenderness.  Lymphadenopathy:     Cervical: No cervical adenopathy.  Skin:    General: Skin is warm and dry.     Coloration: Skin is not pale.     Findings: No erythema.  Neurological:     Mental Status: She is alert and oriented to person, place, and time.     Motor: No abnormal muscle tone.     Coordination: Coordination normal.     Deep Tendon Reflexes: Reflexes are normal and symmetric.  Psychiatric:        Behavior: Behavior normal.        Thought Content: Thought content normal.        Judgment: Judgment normal.           Assessment & Plan:  HIV :  continue SYMTUZA, and we will recheck her labs in the springtime  Headaches:  Being followed by Neurology   HTN: continue Toprol and HCTZ.  Unfortunately I do not know why she did not take her blood pressure medicines today  Myaglias: I wrote for flexeril for her again

## 2020-01-15 ENCOUNTER — Other Ambulatory Visit: Payer: Self-pay

## 2020-01-15 ENCOUNTER — Other Ambulatory Visit: Payer: Medicare Other

## 2020-01-15 DIAGNOSIS — B20 Human immunodeficiency virus [HIV] disease: Secondary | ICD-10-CM

## 2020-01-15 DIAGNOSIS — Z113 Encounter for screening for infections with a predominantly sexual mode of transmission: Secondary | ICD-10-CM

## 2020-01-15 DIAGNOSIS — Z79899 Other long term (current) drug therapy: Secondary | ICD-10-CM

## 2020-01-30 ENCOUNTER — Encounter: Payer: Medicare Other | Admitting: Infectious Disease

## 2020-03-10 ENCOUNTER — Other Ambulatory Visit: Payer: Medicare Other

## 2020-03-25 ENCOUNTER — Encounter: Payer: Medicare Other | Admitting: Infectious Disease

## 2020-04-12 ENCOUNTER — Other Ambulatory Visit: Payer: Self-pay | Admitting: Infectious Disease

## 2020-04-12 DIAGNOSIS — I10 Essential (primary) hypertension: Secondary | ICD-10-CM

## 2020-04-29 ENCOUNTER — Ambulatory Visit: Payer: Medicare Other | Admitting: Infectious Disease

## 2020-05-01 ENCOUNTER — Ambulatory Visit: Payer: Medicare Other | Admitting: Infectious Disease

## 2020-05-28 ENCOUNTER — Other Ambulatory Visit: Payer: Self-pay | Admitting: Infectious Disease

## 2020-05-28 DIAGNOSIS — K219 Gastro-esophageal reflux disease without esophagitis: Secondary | ICD-10-CM

## 2020-05-31 ENCOUNTER — Other Ambulatory Visit: Payer: Self-pay | Admitting: Infectious Disease

## 2020-05-31 DIAGNOSIS — I10 Essential (primary) hypertension: Secondary | ICD-10-CM

## 2020-06-16 ENCOUNTER — Other Ambulatory Visit: Payer: Medicare Other

## 2020-06-30 ENCOUNTER — Ambulatory Visit: Payer: Medicare Other | Admitting: Infectious Disease

## 2020-07-10 ENCOUNTER — Other Ambulatory Visit: Payer: Self-pay | Admitting: Infectious Disease

## 2020-07-10 ENCOUNTER — Telehealth: Payer: Self-pay

## 2020-07-10 DIAGNOSIS — I10 Essential (primary) hypertension: Secondary | ICD-10-CM

## 2020-07-10 DIAGNOSIS — B20 Human immunodeficiency virus [HIV] disease: Secondary | ICD-10-CM

## 2020-07-10 MED ORDER — SYMTUZA 800-150-200-10 MG PO TABS: 1.0000 | ORAL_TABLET | Freq: Every day | ORAL | 1 refills | Status: DC

## 2020-07-10 NOTE — Telephone Encounter (Signed)
Patient called to schedule overdue appointments. Patient also scheduled for Booster covid vaccination at [facility]. Patient is very nervous about coming into the office with covid numbers increasing. Insured patient that [facility] is taking all recommended precautions. Patient scheduled phone visit with Dr. Daiva Eves Medication refill approved.

## 2020-07-18 ENCOUNTER — Other Ambulatory Visit: Payer: Medicare Other

## 2020-07-18 ENCOUNTER — Ambulatory Visit: Payer: Medicare Other

## 2020-08-08 ENCOUNTER — Other Ambulatory Visit: Payer: Medicare Other

## 2020-08-27 ENCOUNTER — Telehealth: Payer: Medicare Other | Admitting: Infectious Disease

## 2020-09-02 ENCOUNTER — Other Ambulatory Visit: Payer: Self-pay | Admitting: Infectious Disease

## 2020-09-02 DIAGNOSIS — K219 Gastro-esophageal reflux disease without esophagitis: Secondary | ICD-10-CM

## 2020-09-03 ENCOUNTER — Encounter: Payer: Self-pay | Admitting: Infectious Disease

## 2020-09-03 ENCOUNTER — Telehealth (INDEPENDENT_AMBULATORY_CARE_PROVIDER_SITE_OTHER): Payer: Medicare Other | Admitting: Infectious Disease

## 2020-09-03 ENCOUNTER — Other Ambulatory Visit: Payer: Self-pay

## 2020-09-03 DIAGNOSIS — R252 Cramp and spasm: Secondary | ICD-10-CM | POA: Diagnosis not present

## 2020-09-03 DIAGNOSIS — N951 Menopausal and female climacteric states: Secondary | ICD-10-CM

## 2020-09-03 DIAGNOSIS — G629 Polyneuropathy, unspecified: Secondary | ICD-10-CM | POA: Diagnosis not present

## 2020-09-03 DIAGNOSIS — B009 Herpesviral infection, unspecified: Secondary | ICD-10-CM | POA: Diagnosis not present

## 2020-09-03 DIAGNOSIS — B20 Human immunodeficiency virus [HIV] disease: Secondary | ICD-10-CM | POA: Diagnosis not present

## 2020-09-03 DIAGNOSIS — I10 Essential (primary) hypertension: Secondary | ICD-10-CM

## 2020-09-03 DIAGNOSIS — G63 Polyneuropathy in diseases classified elsewhere: Secondary | ICD-10-CM

## 2020-09-03 MED ORDER — SYMTUZA 800-150-200-10 MG PO TABS: 1.0000 | ORAL_TABLET | Freq: Every day | ORAL | 1 refills | Status: DC

## 2020-09-03 MED ORDER — CYCLOBENZAPRINE HCL 5 MG PO TABS: ORAL_TABLET | ORAL | 4 refills | Status: DC

## 2020-09-03 MED ORDER — SYMTUZA 800-150-200-10 MG PO TABS: 1.0000 | ORAL_TABLET | Freq: Every day | ORAL | 11 refills | Status: DC

## 2020-09-03 MED ORDER — VALACYCLOVIR HCL 1 G PO TABS
1000.0000 mg | ORAL_TABLET | Freq: Every day | ORAL | 11 refills | Status: DC
Start: 2020-09-03 — End: 2021-12-03

## 2020-09-03 MED ORDER — GABAPENTIN 300 MG PO CAPS: ORAL_CAPSULE | ORAL | 1 refills | Status: DC

## 2020-09-03 NOTE — Progress Notes (Signed)
Virtual Visit via Telephone Note  I connected with [name] on 09/03/20 at 11:00 AM EDT by telephone and verified that I am speaking with the correct person using two identifiers.  Location: Patient: Home Provider: [facility]   I discussed the limitations, risks, security and privacy concerns of performing an evaluation and management service by telephone and the availability of in person appointments. I also discussed with the patient that there may be a patient responsible charge related to this service. The patient expressed understanding and agreed to proceed.   History of Present Illness:  [name] is a 54 year old African-American lady living with HIV that is been perfectly controlled on Symtuza.  She does have comorbid hypertension neuropathy hyperlipidemia and coronary disease.  She had a prior history of cocaine abuse.  She previously had difficulty being adherent to her medications but she has been highly adherent for the past 5 years with typically undetectable viral loads.  She established primary care with [name].  She did not want PCP to prescribe her medications including her non-HIV medications.  I would like however for Dr. Jaynie Shields to take over the non-HIV medication prescriptions.  I do want her shortly to check drug drug in her interactions and [name] is certainly correct in that these need to always be checked in particular given that she is on a boosted protease inhibitor.  She has had 2 COVID-19 vaccines and flu vaccination.  Review of systems as above otherwise 12 point review of symptoms negative  Past Medical History:  Diagnosis Date  . Anemia   . GERD (gastroesophageal reflux disease)   . Headache    hx of migraines   . History of cocaine abuse (HCC)   . HIV disease (HCC)   . HIV infection (HCC)   . Hypertension    not on medication   . 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 Muscle spasm 05/11/2017  . Myocardial  infarction (HCC)    mild MI 09/2004     no heart problems since  . Neck swelling 11/16/2017  . 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 Nonallopathic lesion of head 11/16/2017  . Shingles    2000    Past Surgical History:  Procedure Laterality Date  . ABDOMINAL HYSTERECTOMY  1993   Partial  . CESAREAN SECTION    . LAPAROSCOPIC ASSISTED VENTRAL HERNIA REPAIR N/A 01/02/2015   Procedure: LAPAROSCOPIC VENTRAL WALL HERNIA REPAIR;  Surgeon: Karie Soda, MD;  Location: [facility];  Service: General;  Laterality: N/A;  With MESH  . TONSILLECTOMY  1988    Family History  Problem Relation Age of Onset  . Diabetes type II Brother   . Migraines Neg Hx       Social History   Socioeconomic History  . Marital status: Single    Spouse name: Not on file  . Number of children: Not on file  . Years of education: HS  . Highest education level: Not on file  Occupational History  . Occupation: disabled  Tobacco Use  . Smoking status: Former Smoker    Types: Cigarettes  . Smokeless tobacco: Never Used  Substance and Sexual Activity  . Alcohol use: Yes    Comment: rare red wine   . Drug use: Yes    Frequency: 14.0 times per week    Types: Marijuana  . Sexual activity: Not Currently    Comment: pt. declined condoms  Other Topics Concern  . Not on file  Social  History Narrative  . Not on file   Social Determinants of Health   Financial Resource Strain:   . Difficulty of Paying Living Expenses: Not on file  Food Insecurity:   . Worried About Programme researcher, broadcasting/film/video in the Last Year: Not on file  . Ran Out of Food in the Last Year: Not on file  Transportation Needs:   . Lack of Transportation (Medical): Not on file  . Lack of Transportation (Non-Medical): Not on file  Physical Activity:   . Days of Exercise per Week: Not on file  . Minutes of Exercise per Session: Not on file  Stress:   . Feeling of Stress : Not on file  Social Connections:   . Frequency of Communication with Friends  and Family: Not on file  . Frequency of Social Gatherings with Friends and Family: Not on file  . Attends Religious Services: Not on file  . Active Member of Clubs or Organizations: Not on file  . Attends Banker Meetings: Not on file  . Marital Status: Not on file    Allergies  Allergen Reactions  . Morphine Other (See Comments)    Burning under the skin  . Doxycycline Hyclate Rash     Current Outpatient Medications:  .  cyclobenzaprine (FLEXERIL) 5 MG tablet, TAKE 1 TABLET BY MOUTH THREE TIMES A DAY AS NEEDED FOR MUSCLE SPASMS, Disp: 60 tablet, Rfl: 4 .  Darunavir-Cobicisctat-Emtricitabine-Tenofovir Alafenamide (SYMTUZA) 800-150-200-10 MG TABS, Take 1 tablet by mouth daily with breakfast., Disp: 30 tablet, Rfl: 11 .  gabapentin (NEURONTIN) 300 MG capsule, TAKE 1 CAPSULE BY MOUTH THREE TIMES A DAY, Disp: 270 capsule, Rfl: 1 .  hydrochlorothiazide (HYDRODIURIL) 25 MG tablet, TAKE 1 TABLET BY MOUTH EVERY DAY, Disp: 90 tablet, Rfl: 0 .  metoprolol succinate (TOPROL-XL) 50 MG 24 hr tablet, TAKE 1 TABLET BY MOUTH EVERYDAY AT BEDTIME, Disp: 90 tablet, Rfl: 1 .  omeprazole (PRILOSEC) 20 MG capsule, TAKE 1 CAPSULE BY MOUTH TWICE A DAY, Disp: 180 capsule, Rfl: 0 .  valACYclovir (VALTREX) 1000 MG tablet, Take 1 tablet (1,000 mg total) by mouth daily., Disp: 90 tablet, Rfl: 11 .  cloNIDine (CATAPRES) 0.1 MG tablet, Take 0.1 mg by mouth at bedtime., Disp: , Rfl:     Observations/Objective: [name] seemed in good spirits over the phone and looking forward to come to clinic to get third COVID-19 vaccine labs and to see me in follow-up.    Assessment and Plan:  HIV disease continue Symtuza  Hypertension continue current medications.  Neuropathy continue gabapentin.  History of muscle spasm I gave another prescription of Flexeril for this.  Menopausal symptoms has been on clonidine.  HSV continue prophylactic Valtrex  Follow Up Instructions:    I discussed the assessment  and treatment plan with the patient. The patient was provided an opportunity to ask questions and all were answered. The patient agreed with the plan and demonstrated an understanding of the instructions.   The patient was advised to call back or seek an in-person evaluation if the symptoms worsen or if the condition fails to improve as anticipated.  I provided 21 minutes of non-face-to-face time during this encounter.   Acey Lav, MD

## 2020-09-12 ENCOUNTER — Ambulatory Visit: Payer: Medicare Other

## 2020-09-12 ENCOUNTER — Other Ambulatory Visit: Payer: Medicare Other

## 2020-09-12 ENCOUNTER — Other Ambulatory Visit: Payer: Self-pay

## 2020-09-12 DIAGNOSIS — Z113 Encounter for screening for infections with a predominantly sexual mode of transmission: Secondary | ICD-10-CM

## 2020-09-12 DIAGNOSIS — B20 Human immunodeficiency virus [HIV] disease: Secondary | ICD-10-CM

## 2020-09-12 DIAGNOSIS — Z79899 Other long term (current) drug therapy: Secondary | ICD-10-CM

## 2020-10-01 ENCOUNTER — Ambulatory Visit: Payer: Medicare Other | Admitting: Infectious Disease

## 2020-10-22 ENCOUNTER — Ambulatory Visit: Payer: Medicare Other | Admitting: Infectious Disease

## 2020-12-01 ENCOUNTER — Other Ambulatory Visit: Payer: Self-pay | Admitting: Infectious Disease

## 2020-12-01 DIAGNOSIS — K219 Gastro-esophageal reflux disease without esophagitis: Secondary | ICD-10-CM

## 2020-12-01 DIAGNOSIS — I10 Essential (primary) hypertension: Secondary | ICD-10-CM

## 2020-12-01 NOTE — Telephone Encounter (Signed)
Patient has established care with [facility]. Per Dr. Daiva Eves he prefers patient to have non-HIV medications to be filled by PCP.  PCP has been alerted by [employer].

## 2020-12-03 ENCOUNTER — Ambulatory Visit: Payer: Medicare Other | Admitting: Infectious Disease

## 2020-12-08 NOTE — Progress Notes (Signed)
Flu shot administered, patient tolerated well. [name]

## 2021-01-06 ENCOUNTER — Other Ambulatory Visit: Payer: Self-pay

## 2021-01-06 ENCOUNTER — Emergency Department (HOSPITAL_COMMUNITY): Payer: Medicare Other

## 2021-01-06 ENCOUNTER — Emergency Department (HOSPITAL_BASED_OUTPATIENT_CLINIC_OR_DEPARTMENT_OTHER): Payer: Medicare Other

## 2021-01-06 ENCOUNTER — Encounter (HOSPITAL_BASED_OUTPATIENT_CLINIC_OR_DEPARTMENT_OTHER): Payer: Self-pay | Admitting: *Deleted

## 2021-01-06 ENCOUNTER — Observation Stay (HOSPITAL_BASED_OUTPATIENT_CLINIC_OR_DEPARTMENT_OTHER)
Admission: EM | Admit: 2021-01-06 | Discharge: 2021-01-07 | Disposition: A | Discharge status: Home / Self Care | Payer: Medicare Other | Attending: Student in an Organized Health Care Education/Training Program | Admitting: Student in an Organized Health Care Education/Training Program

## 2021-01-06 DIAGNOSIS — Z20822 Contact with and (suspected) exposure to covid-19: Secondary | ICD-10-CM | POA: Insufficient documentation

## 2021-01-06 DIAGNOSIS — I10 Essential (primary) hypertension: Secondary | ICD-10-CM | POA: Diagnosis present

## 2021-01-06 DIAGNOSIS — B20 Human immunodeficiency virus [HIV] disease: Secondary | ICD-10-CM

## 2021-01-06 DIAGNOSIS — Z87891 Personal history of nicotine dependence: Secondary | ICD-10-CM | POA: Insufficient documentation

## 2021-01-06 DIAGNOSIS — F419 Anxiety disorder, unspecified: Secondary | ICD-10-CM

## 2021-01-06 DIAGNOSIS — G459 Transient cerebral ischemic attack, unspecified: Secondary | ICD-10-CM | POA: Diagnosis present

## 2021-01-06 DIAGNOSIS — Z79899 Other long term (current) drug therapy: Secondary | ICD-10-CM | POA: Diagnosis not present

## 2021-01-06 DIAGNOSIS — G43109 Migraine with aura, not intractable, without status migrainosus: Secondary | ICD-10-CM

## 2021-01-06 DIAGNOSIS — I63331 Cerebral infarction due to thrombosis of right posterior cerebral artery: Secondary | ICD-10-CM | POA: Diagnosis not present

## 2021-01-06 DIAGNOSIS — R200 Anesthesia of skin: Secondary | ICD-10-CM | POA: Diagnosis present

## 2021-01-06 LAB — URINALYSIS, MICROSCOPIC (REFLEX)

## 2021-01-06 LAB — CBC
HCT: 40.9 % (ref 36.0–46.0)
Hemoglobin: 13.5 g/dL (ref 12.0–15.0)
MCH: 29 pg (ref 26.0–34.0)
MCHC: 33 g/dL (ref 30.0–36.0)
MCV: 87.8 fL (ref 80.0–100.0)
Platelets: 406 10*3/uL — ABNORMAL HIGH (ref 150–400)
RBC: 4.66 MIL/uL (ref 3.87–5.11)
RDW: 13.2 % (ref 11.5–15.5)
WBC: 8.9 10*3/uL (ref 4.0–10.5)
nRBC: 0 % (ref 0.0–0.2)

## 2021-01-06 LAB — RAPID URINE DRUG SCREEN, HOSP PERFORMED
Amphetamines: NOT DETECTED
Barbiturates: NOT DETECTED
Benzodiazepines: NOT DETECTED
Cocaine: NOT DETECTED
Opiates: NOT DETECTED
Tetrahydrocannabinol: POSITIVE — AB

## 2021-01-06 LAB — PREGNANCY, URINE: Preg Test, Ur: NEGATIVE

## 2021-01-06 LAB — URINALYSIS, ROUTINE W REFLEX MICROSCOPIC
Bilirubin Urine: NEGATIVE
Glucose, UA: NEGATIVE mg/dL
Ketones, ur: NEGATIVE mg/dL
Leukocytes,Ua: NEGATIVE
Nitrite: NEGATIVE
Protein, ur: NEGATIVE mg/dL
Specific Gravity, Urine: 1.02 (ref 1.005–1.030)
pH: 6 (ref 5.0–8.0)

## 2021-01-06 LAB — COMPREHENSIVE METABOLIC PANEL
ALT: 43 U/L (ref 0–44)
AST: 29 U/L (ref 15–41)
Albumin: 3.9 g/dL (ref 3.5–5.0)
Alkaline Phosphatase: 85 U/L (ref 38–126)
Anion gap: 8 (ref 5–15)
BUN: 15 mg/dL (ref 6–20)
CO2: 28 mmol/L (ref 22–32)
Calcium: 9 mg/dL (ref 8.9–10.3)
Chloride: 103 mmol/L (ref 98–111)
Creatinine, Ser: 0.98 mg/dL (ref 0.44–1.00)
GFR, Estimated: >60 mL/min (ref >60)
Glucose, Bld: 107 mg/dL — ABNORMAL HIGH (ref 70–99)
Potassium: 4.4 mmol/L (ref 3.5–5.1)
Sodium: 139 mmol/L (ref 135–145)
Total Bilirubin: 0.3 mg/dL (ref 0.3–1.2)
Total Protein: 6.9 g/dL (ref 6.5–8.1)

## 2021-01-06 LAB — APTT: aPTT: 25 seconds (ref 24–36)

## 2021-01-06 LAB — DIFFERENTIAL
Abs Immature Granulocytes: 0.04 10*3/uL (ref 0.00–0.07)
Basophils Absolute: 0.1 10*3/uL (ref 0.0–0.1)
Basophils Relative: 1 %
Eosinophils Absolute: 0.5 10*3/uL (ref 0.0–0.5)
Eosinophils Relative: 5 %
Immature Granulocytes: 1 %
Lymphocytes Relative: 38 %
Lymphs Abs: 3.3 10*3/uL (ref 0.7–4.0)
Monocytes Absolute: 0.5 10*3/uL (ref 0.1–1.0)
Monocytes Relative: 6 %
Neutro Abs: 4.4 10*3/uL (ref 1.7–7.7)
Neutrophils Relative %: 49 %

## 2021-01-06 LAB — SARS CORONAVIRUS 2 BY RT PCR (HOSPITAL ORDER, PERFORMED IN ~~LOC~~ HOSPITAL LAB): SARS Coronavirus 2: NEGATIVE

## 2021-01-06 LAB — LIPID PANEL
Cholesterol: 246 mg/dL — ABNORMAL HIGH (ref 0–200)
HDL: 35 mg/dL — ABNORMAL LOW (ref >40)
LDL Cholesterol: 178 mg/dL — ABNORMAL HIGH (ref 0–99)
Total CHOL/HDL Ratio: 7 RATIO
Triglycerides: 164 mg/dL — ABNORMAL HIGH (ref <150)
VLDL: 33 mg/dL (ref 0–40)

## 2021-01-06 LAB — PROTIME-INR
INR: 0.9 (ref 0.8–1.2)
Prothrombin Time: 11.7 seconds (ref 11.4–15.2)

## 2021-01-06 LAB — HEMOGLOBIN A1C
Hgb A1c MFr Bld: 6.1 % — ABNORMAL HIGH (ref 4.8–5.6)
Mean Plasma Glucose: 128.37 mg/dL

## 2021-01-06 LAB — ETHANOL: Alcohol, Ethyl (B): <10 mg/dL (ref <10)

## 2021-01-06 LAB — CBG MONITORING, ED: Glucose-Capillary: 101 mg/dL — ABNORMAL HIGH (ref 70–99)

## 2021-01-06 IMAGING — MR MR HEAD W/O CM
11 of 12 series · 44 of 48 positions shown · IV contrast (gadavist)
Comparison: None.

CLINICAL DATA: Code stroke follow-up, left-sided numbness

EXAM:
MRI HEAD WITHOUT CONTRAST
MRA HEAD WITHOUT CONTRAST
MRA NECK WITHOUT AND WITH CONTRAST
TECHNIQUE: Multiplanar, multiecho pulse sequences of the brain and surrounding
structures were obtained without intravenous contrast. Angiographic
images of the Circle of Willis were obtained using MRA technique
without intravenous contrast. Angiographic images of the neck were
obtained using MRA technique without and with intravenous contrast.
Carotid stenosis measurements (when applicable) are obtained
utilizing NASCET criteria, using the distal internal carotid
diameter as the denominator.
CONTRAST:  9mL GADAVIST GADOBUTROL 1 MMOL/ML IV SOLN

[Series 5: DWI · axial · 3.0mm · 0.88mm/px · z∈[-138,+15]mm · 10 of 104 slices shown (1 of 4)]
[im 1/104]
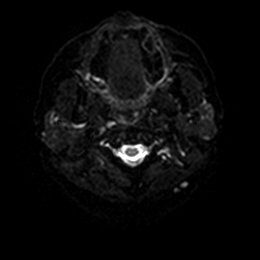
[im 12/104]
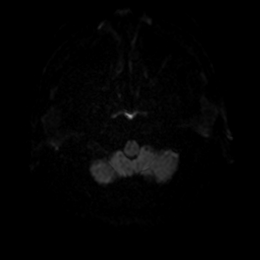
[im 23/104]
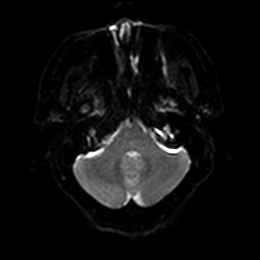
[im 35/104]
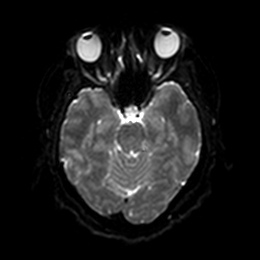
[im 46/104]
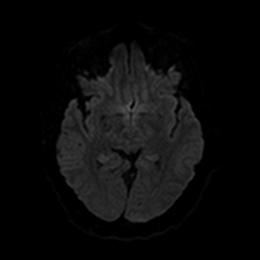
[im 58/104]
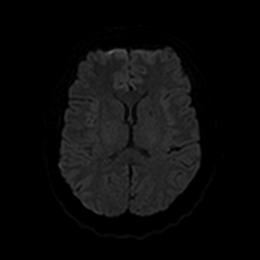
[im 69/104]
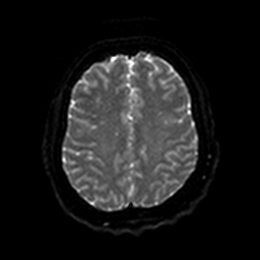
[im 81/104]
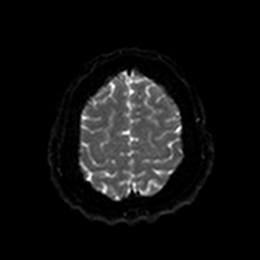
[im 92/104]
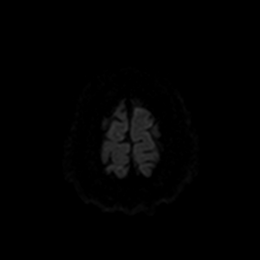
[im 104/104]
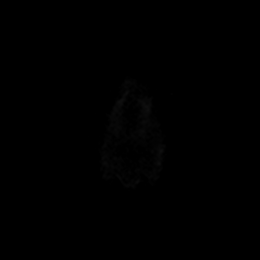

[Series 6: DWI · axial · 3.0mm · 0.88mm/px · z∈[-138,+15]mm · 5 of 52 slices shown (2 of 4)]
[im 1/52]
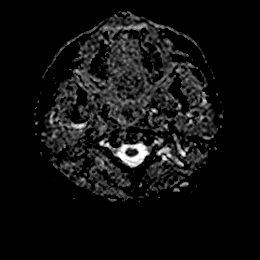
[im 13/52]
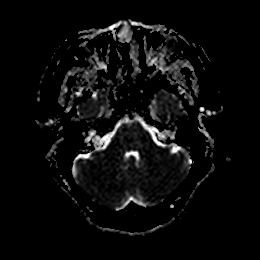
[im 26/52]
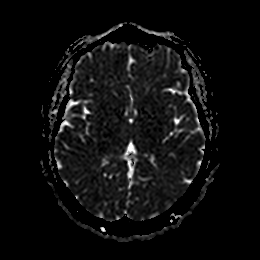
[im 39/52]
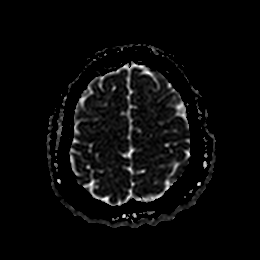
[im 52/52]
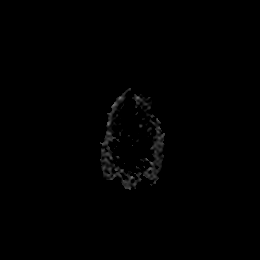

[Series 7: DWI · coronal · 4.0mm · 0.88mm/px · 6 of 72 slices shown (3 of 4)]
[im 1/72]
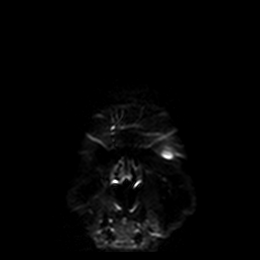
[im 15/72]
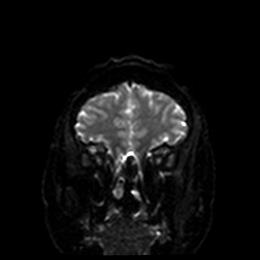
[im 29/72]
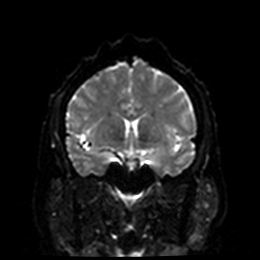
[im 43/72]
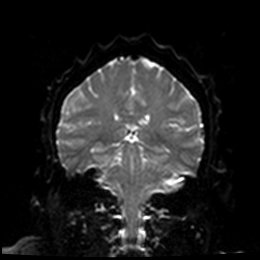
[im 57/72]
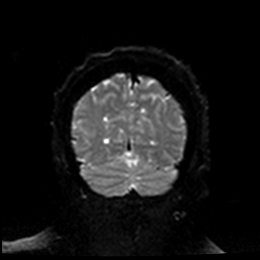
[im 72/72]
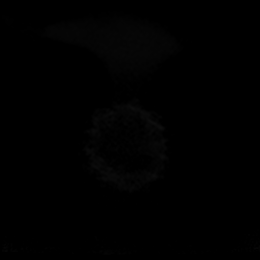

[Series 8: DWI · coronal · 4.0mm · 0.88mm/px · 3 of 36 slices shown (4 of 4)]
[im 1/36]
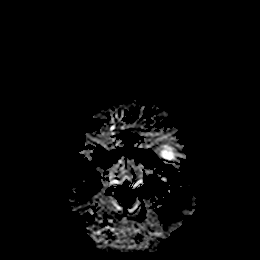
[im 18/36]
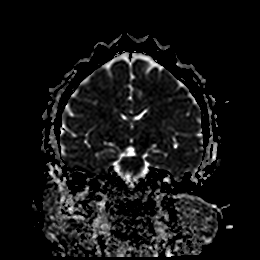
[im 36/36]
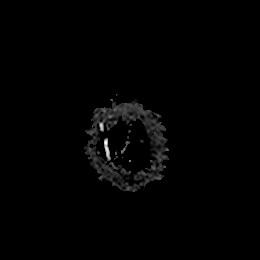

[Series 13: T1 · sagittal · 5.0mm · 0.75mm/px · 2 of 23 slices shown]
[im 1/23]
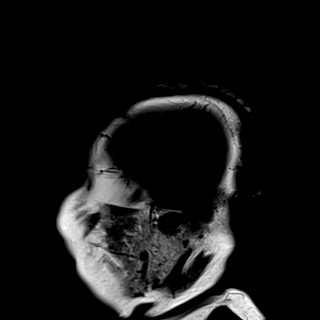
[im 23/23]
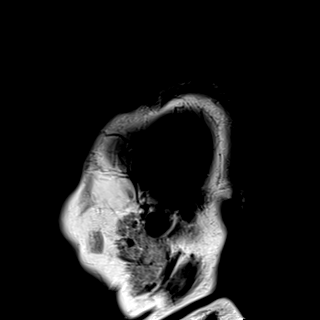

[Series 14: T2 · axial · 5.0mm · 0.72mm/px · z∈[-144,-0]mm · 2 of 25 slices shown (1 of 2)]
[im 1/25]
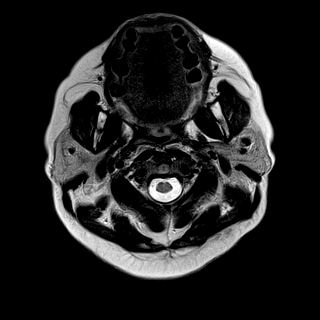
[im 25/25]
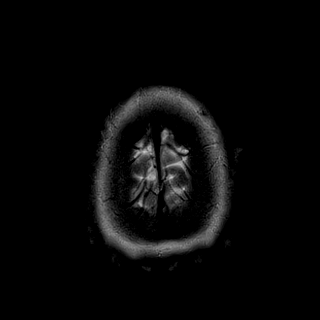

[Series 15: FLAIR · axial · 5.0mm · 0.45mm/px · z∈[-143,+1]mm · 2 of 25 slices shown]
[im 1/25]
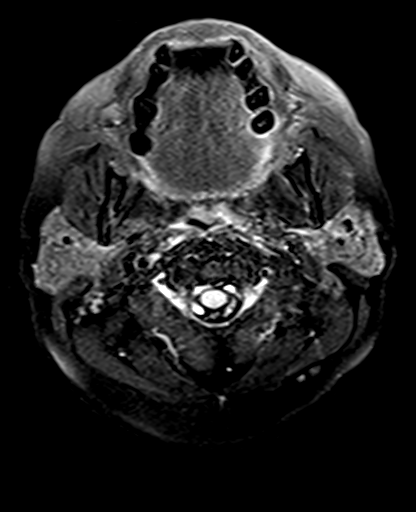
[im 25/25]
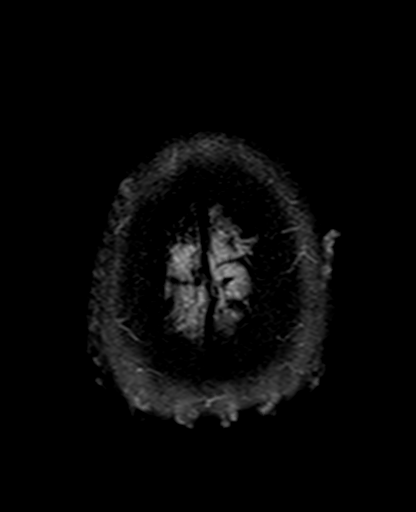

[Series 16: mag_images · axial · 3.0mm · 0.90mm/px · z∈[-137,+16]mm · 4 of 52 slices shown]
[im 1/52]
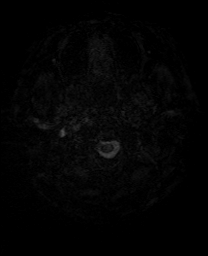
[im 18/52]
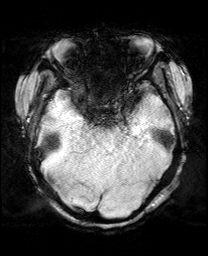
[im 35/52]
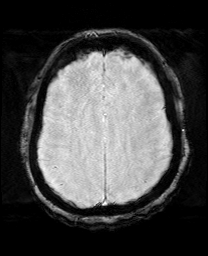
[im 52/52]
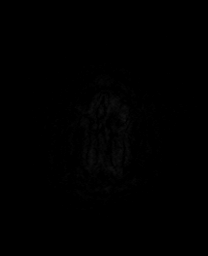

[Series 17: pha_images · axial · 3.0mm · 0.90mm/px · z∈[-137,+16]mm · 4 of 52 slices shown]
[im 1/52]
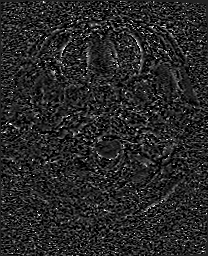
[im 18/52]
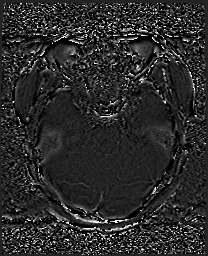
[im 35/52]
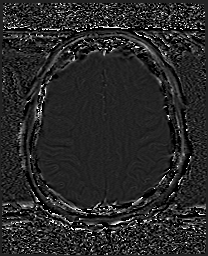
[im 52/52]
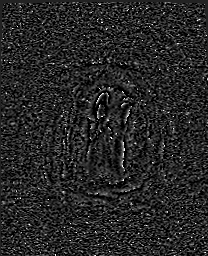

[Series 18: swi_images · axial · 3.0mm · 0.90mm/px · z∈[-137,+16]mm · 4 of 52 slices shown]
[im 1/52]
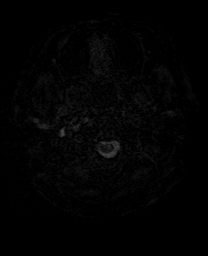
[im 18/52]
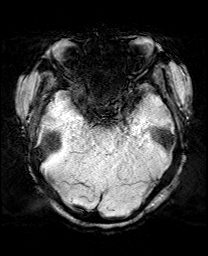
[im 35/52]
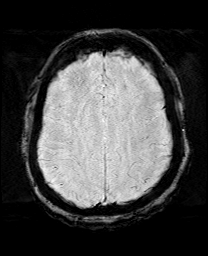
[im 52/52]
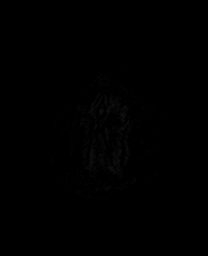

[Series 21: T2 · coronal · 5.0mm · 0.34mm/px · 2 of 29 slices shown (2 of 2)]
[im 1/29]
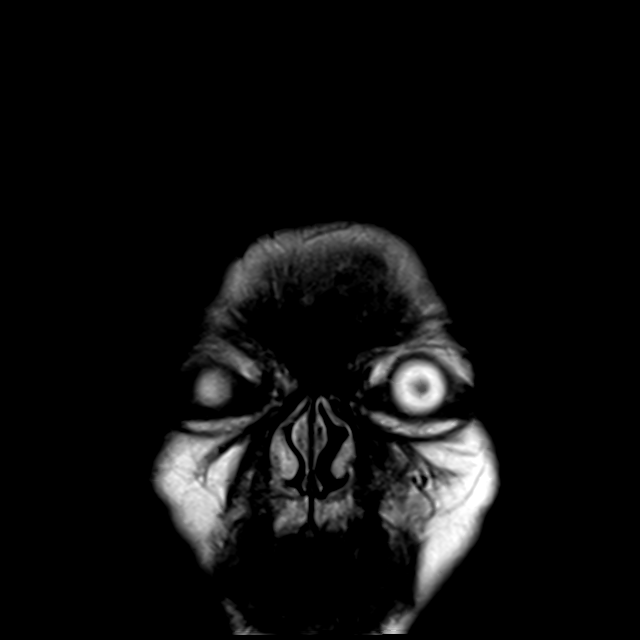
[im 29/29]
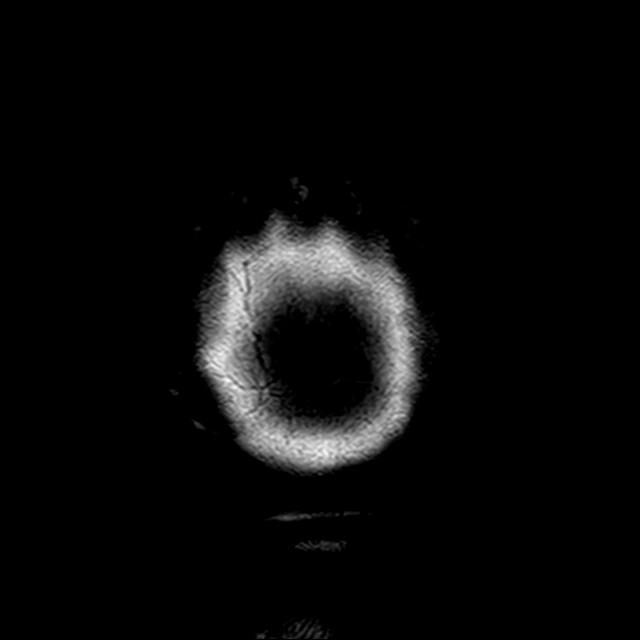

[44 of 48 positions shown; findings below may reference images not displayed]

FINDINGS: MRI HEAD

Brain: There is no acute infarction or intracranial hemorrhage.
There is no intracranial mass, mass effect, or edema. There is no
hydrocephalus or extra-axial fluid collection. Ventricles and sulci
are normal in size and configuration. Patchy small foci of T2
hyperintensity in the supratentorial white matter are nonspecific
but may reflect mild chronic microvascular ischemic changes.

Vascular: Major vessel flow voids at the skull base are preserved.

Skull and upper cervical spine: Normal marrow signal is preserved.

Sinuses/Orbits: Mild mucosal thickening.  Orbits are unremarkable.

Other: Sella is unremarkable.  Mastoid air cells are clear.

MRA HEAD

Intracranial internal carotid arteries are patent. Middle and
anterior cerebral arteries are patent. Intracranial vertebral
arteries and basilar artery are patent. Right posterior
communicating artery is present. Right P1 PCA is patent. There is a
6 mm segment of occlusion or high-grade stenosis of the proximal
right P2 PCA. Left posterior cerebral artery is patent. There is no
aneurysm.

MRA NECK

Common, internal, and external carotid arteries are patent.
Codominant extracranial vertebral arteries are patent. No stenosis
or evidence of dissection.
IMPRESSION: No acute infarction, hemorrhage, or mass. Mild chronic microvascular
ischemic changes.

Segmental occlusion or high-grade stenosis of the proximal right P2
PCA.

No occlusion or hemodynamically significant stenosis in the neck.

## 2021-01-06 IMAGING — MR MR MRA HEAD W/O CM
1 of 2 series · 10 of 48 positions shown · IV contrast (gadavist)
Comparison: None.

CLINICAL DATA: Code stroke follow-up, left-sided numbness

EXAM:
MRI HEAD WITHOUT CONTRAST
MRA HEAD WITHOUT CONTRAST
MRA NECK WITHOUT AND WITH CONTRAST
TECHNIQUE: Multiplanar, multiecho pulse sequences of the brain and surrounding
structures were obtained without intravenous contrast. Angiographic
images of the Circle of Willis were obtained using MRA technique
without intravenous contrast. Angiographic images of the neck were
obtained using MRA technique without and with intravenous contrast.
Carotid stenosis measurements (when applicable) are obtained
utilizing NASCET criteria, using the distal internal carotid
diameter as the denominator.
CONTRAST:  9mL GADAVIST GADOBUTROL 1 MMOL/ML IV SOLN

[Series 19: mip_images(sw) · axial · 24.0mm · 0.90mm/px · z∈[-126,+5]mm · 10 of 45 slices shown]
[im 1/45]
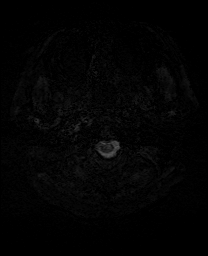
[im 5/45]
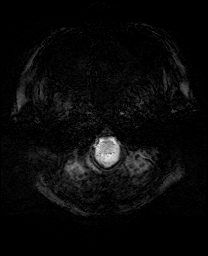
[im 10/45]
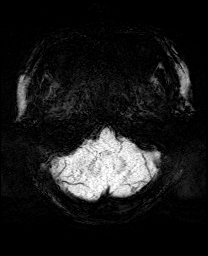
[im 15/45]
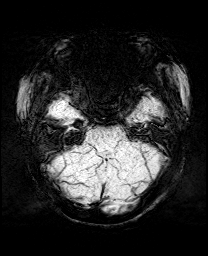
[im 20/45]
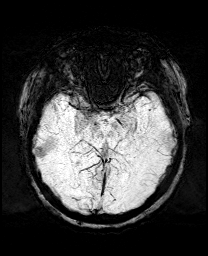
[im 25/45]
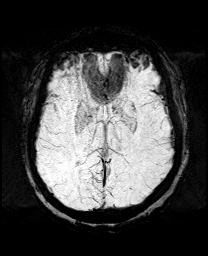
[im 30/45]
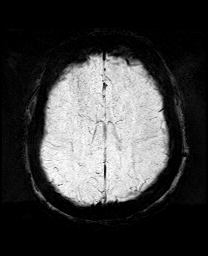
[im 35/45]
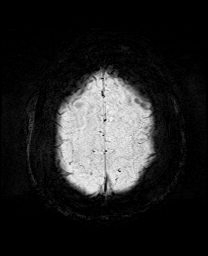
[im 40/45]
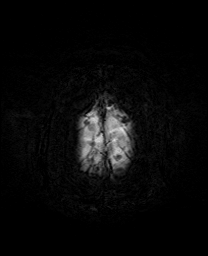
[im 45/45]
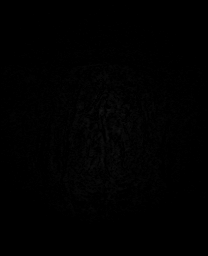

[10 of 48 positions shown; findings below may reference images not displayed]

FINDINGS: MRI HEAD

Brain: There is no acute infarction or intracranial hemorrhage.
There is no intracranial mass, mass effect, or edema. There is no
hydrocephalus or extra-axial fluid collection. Ventricles and sulci
are normal in size and configuration. Patchy small foci of T2
hyperintensity in the supratentorial white matter are nonspecific
but may reflect mild chronic microvascular ischemic changes.

Vascular: Major vessel flow voids at the skull base are preserved.

Skull and upper cervical spine: Normal marrow signal is preserved.

Sinuses/Orbits: Mild mucosal thickening.  Orbits are unremarkable.

Other: Sella is unremarkable.  Mastoid air cells are clear.

MRA HEAD

Intracranial internal carotid arteries are patent. Middle and
anterior cerebral arteries are patent. Intracranial vertebral
arteries and basilar artery are patent. Right posterior
communicating artery is present. Right P1 PCA is patent. There is a
6 mm segment of occlusion or high-grade stenosis of the proximal
right P2 PCA. Left posterior cerebral artery is patent. There is no
aneurysm.

MRA NECK

Common, internal, and external carotid arteries are patent.
Codominant extracranial vertebral arteries are patent. No stenosis
or evidence of dissection.
IMPRESSION: No acute infarction, hemorrhage, or mass. Mild chronic microvascular
ischemic changes.

Segmental occlusion or high-grade stenosis of the proximal right P2
PCA.

No occlusion or hemodynamically significant stenosis in the neck.

## 2021-01-06 IMAGING — MR MR MRA NECK WO/W CM
8 series · 43 of 48 positions shown · IV contrast (gadavist)
Comparison: None.

CLINICAL DATA: Code stroke follow-up, left-sided numbness

EXAM:
MRI HEAD WITHOUT CONTRAST
MRA HEAD WITHOUT CONTRAST
MRA NECK WITHOUT AND WITH CONTRAST
TECHNIQUE: Multiplanar, multiecho pulse sequences of the brain and surrounding
structures were obtained without intravenous contrast. Angiographic
images of the Circle of Willis were obtained using MRA technique
without intravenous contrast. Angiographic images of the neck were
obtained using MRA technique without and with intravenous contrast.
Carotid stenosis measurements (when applicable) are obtained
utilizing NASCET criteria, using the distal internal carotid
diameter as the denominator.
CONTRAST:  9mL GADAVIST GADOBUTROL 1 MMOL/ML IV SOLN

[Series 10: tof_fl3d_tra_iso · axial · 0.6mm · 0.52mm/px · z∈[-205,-126]mm · 7 of 133 slices shown]
[im 1/133]
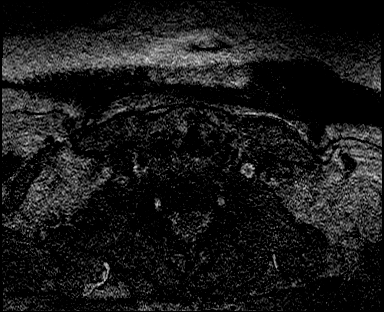
[im 23/133]
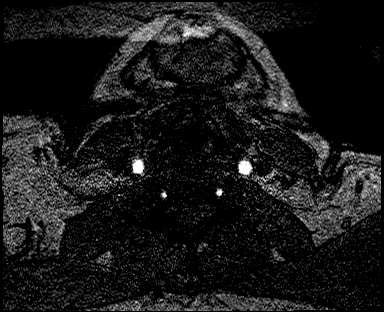
[im 45/133]
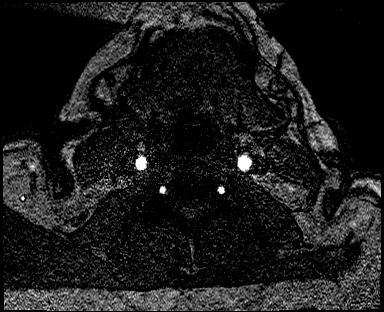
[im 67/133]
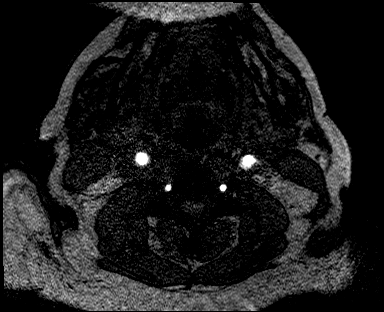
[im 89/133]
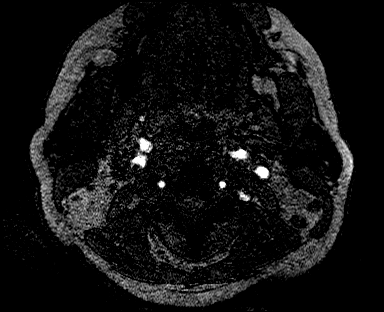
[im 111/133]
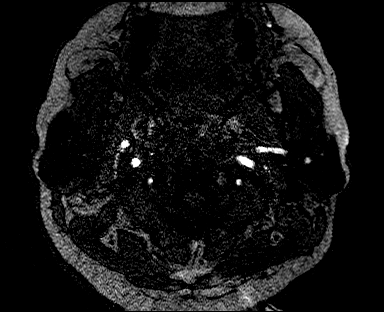
[im 133/133]
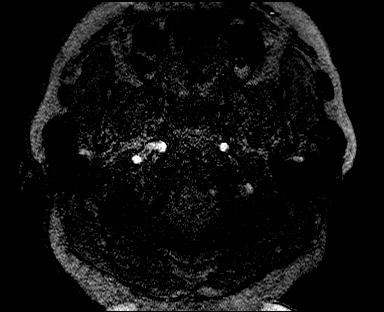

[Series 13: angio_fl3d_cor_pre_ttc=3.0s · coronal · 0.9mm · 0.85mm/px · 5 of 96 slices shown]
[im 1/96]
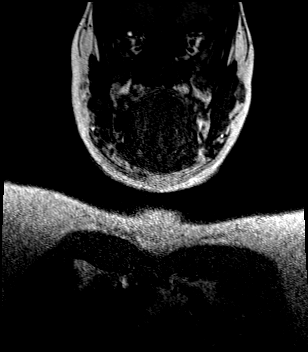
[im 24/96]
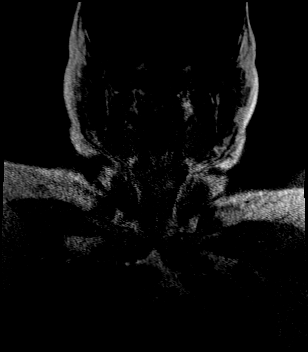
[im 48/96]
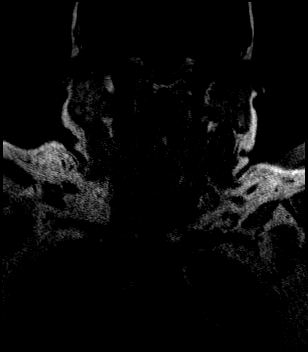
[im 72/96]
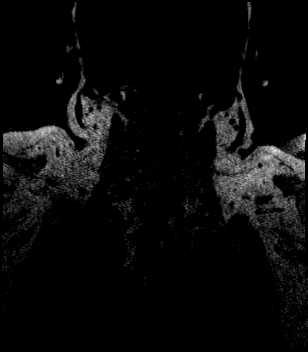
[im 96/96]
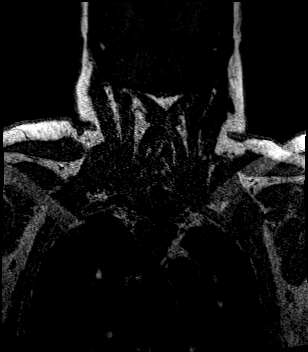

[Series 15: angio_fl3d_cor_post_ttc=3.0s · coronal · 0.9mm · 0.85mm/px · 6 of 96 slices shown (1 of 2)]
[im 1/96]
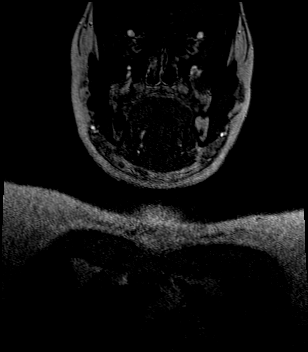
[im 20/96]
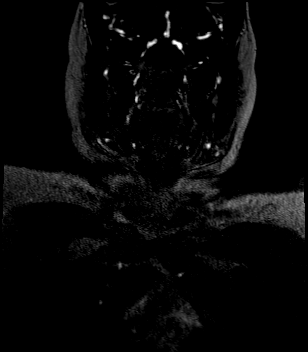
[im 39/96]
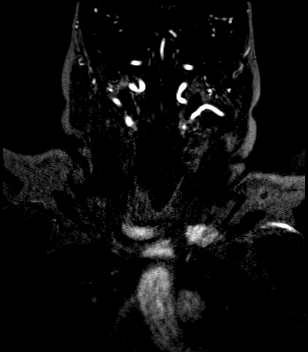
[im 58/96]
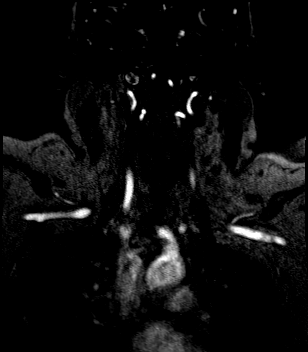
[im 77/96]
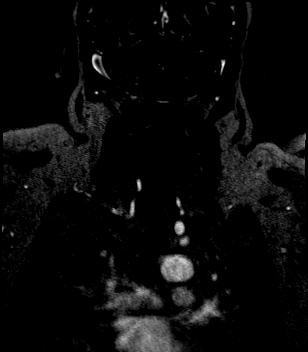
[im 96/96]
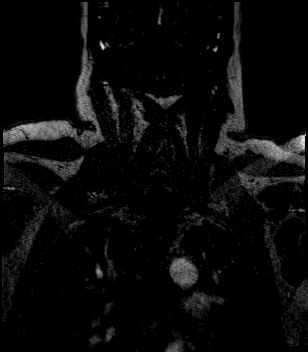

[Series 16: angio_fl3d_cor_post_ttc=3.0s_moco-adv · coronal · 0.9mm · 0.85mm/px · 6 of 96 slices shown (1 of 2)]
[im 1/96]
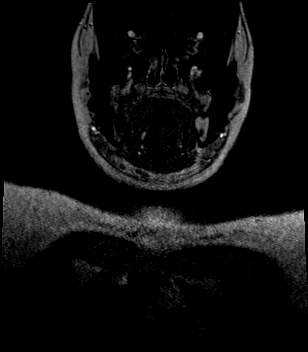
[im 20/96]
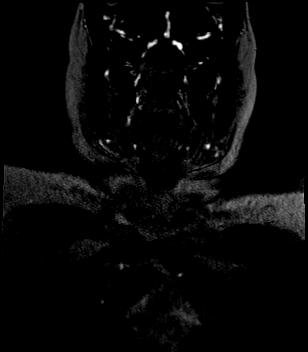
[im 39/96]
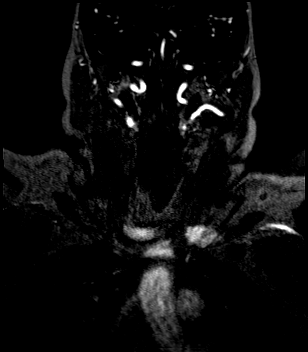
[im 58/96]
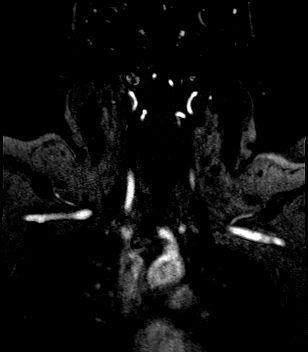
[im 77/96]
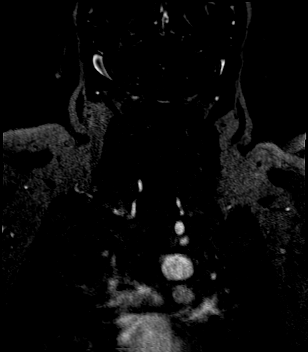
[im 96/96]
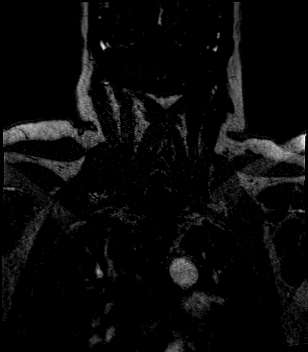

[Series 17: angio_fl3d_cor_post_ttc=3.0s_moco-adv_sub · coronal · 0.9mm · 0.85mm/px · 6 of 93 slices shown (1 of 2)]
[im 1/93]
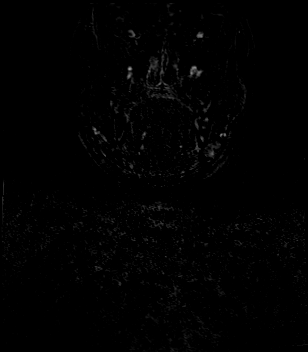
[im 19/93]
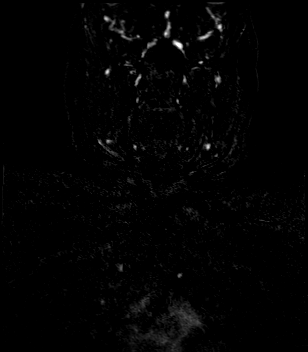
[im 37/93]
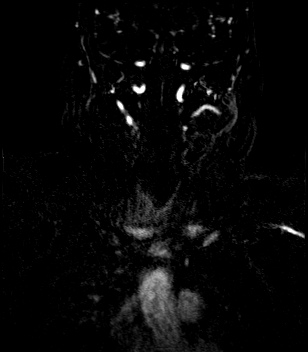
[im 56/93]
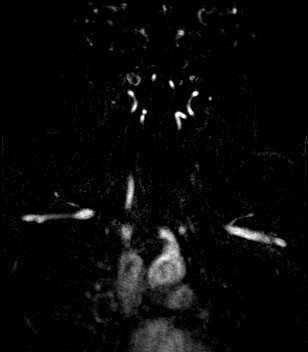
[im 74/93]
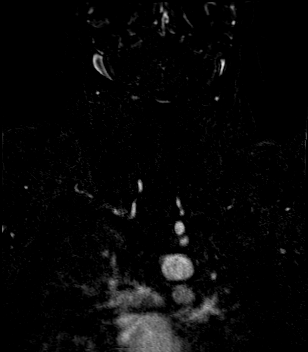
[im 93/93]
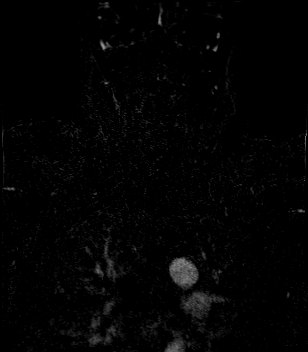

[Series 19: angio_fl3d_cor_post_ttc=3.0s · coronal · 0.9mm · 0.85mm/px · 6 of 96 slices shown (2 of 2)]
[im 1/96]
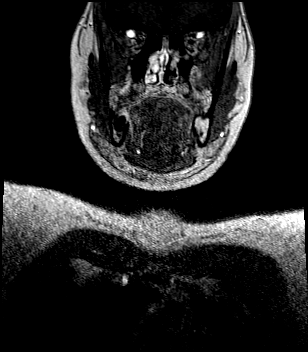
[im 20/96]
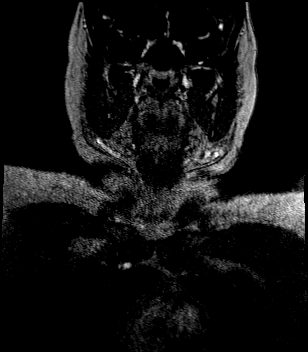
[im 39/96]
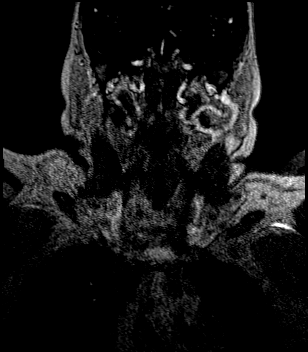
[im 58/96]
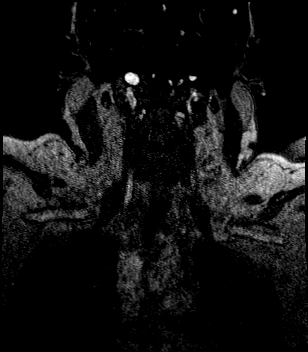
[im 77/96]
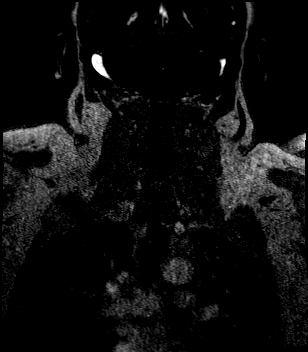
[im 96/96]
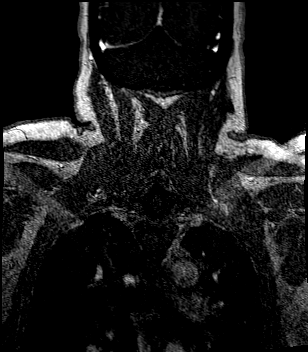

[Series 20: angio_fl3d_cor_post_ttc=3.0s_moco-adv · coronal · 0.9mm · 0.85mm/px · 6 of 96 slices shown (2 of 2)]
[im 1/96]
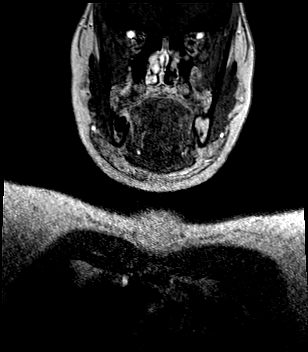
[im 20/96]
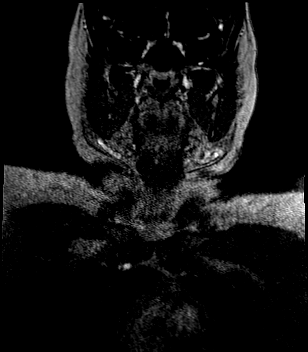
[im 39/96]
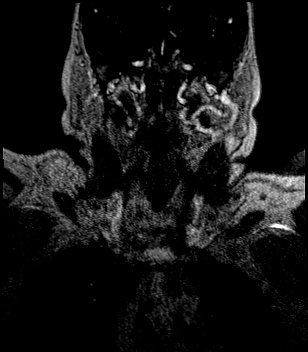
[im 58/96]
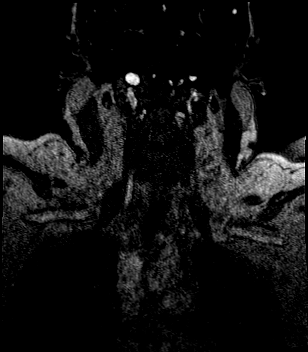
[im 77/96]
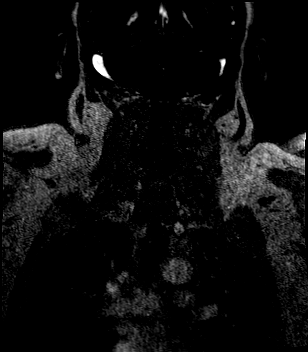
[im 96/96]
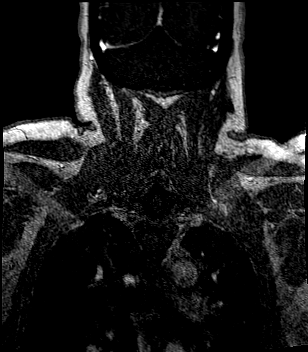

[Series 21: angio_fl3d_cor_post_ttc=3.0s_moco-adv_sub · coronal · 0.9mm · 0.85mm/px · 1 of 96 slices shown (2 of 2)]
[im 1/96]
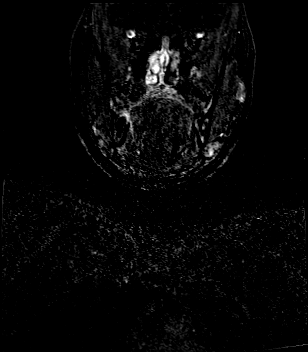

[43 of 48 positions shown; findings below may reference images not displayed]

FINDINGS: MRI HEAD

Brain: There is no acute infarction or intracranial hemorrhage.
There is no intracranial mass, mass effect, or edema. There is no
hydrocephalus or extra-axial fluid collection. Ventricles and sulci
are normal in size and configuration. Patchy small foci of T2
hyperintensity in the supratentorial white matter are nonspecific
but may reflect mild chronic microvascular ischemic changes.

Vascular: Major vessel flow voids at the skull base are preserved.

Skull and upper cervical spine: Normal marrow signal is preserved.

Sinuses/Orbits: Mild mucosal thickening.  Orbits are unremarkable.

Other: Sella is unremarkable.  Mastoid air cells are clear.

MRA HEAD

Intracranial internal carotid arteries are patent. Middle and
anterior cerebral arteries are patent. Intracranial vertebral
arteries and basilar artery are patent. Right posterior
communicating artery is present. Right P1 PCA is patent. There is a
6 mm segment of occlusion or high-grade stenosis of the proximal
right P2 PCA. Left posterior cerebral artery is patent. There is no
aneurysm.

MRA NECK

Common, internal, and external carotid arteries are patent.
Codominant extracranial vertebral arteries are patent. No stenosis
or evidence of dissection.
IMPRESSION: No acute infarction, hemorrhage, or mass. Mild chronic microvascular
ischemic changes.

Segmental occlusion or high-grade stenosis of the proximal right P2
PCA.

No occlusion or hemodynamically significant stenosis in the neck.

## 2021-01-06 IMAGING — CT CT HEAD CODE STROKE
3 series · 16 of 47 positions shown, 19 images · non-contrast
Comparison: CT head [DATE].

CLINICAL DATA: Code stroke.  Dizziness.  Left-sided numbness.

EXAM:
CT HEAD WITHOUT CONTRAST
TECHNIQUE: Contiguous axial images were obtained from the base of the skull
through the vertex without intravenous contrast.

[Series 3: head wo · axial · 0.42mm/px · z∈[-169,-44]mm · 10 of 31 slices shown, 13 images]
[im 3/31  brain]
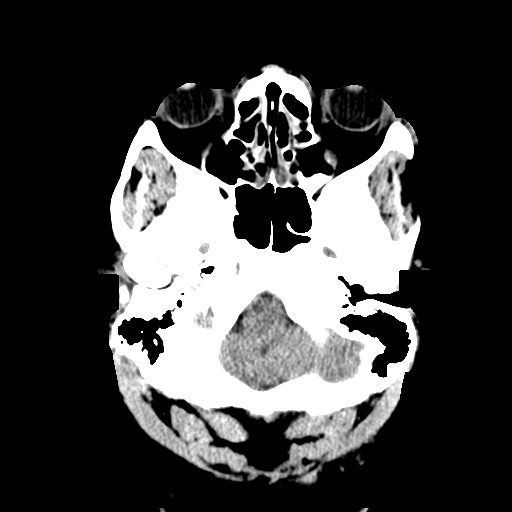
[im 3/31  bone]
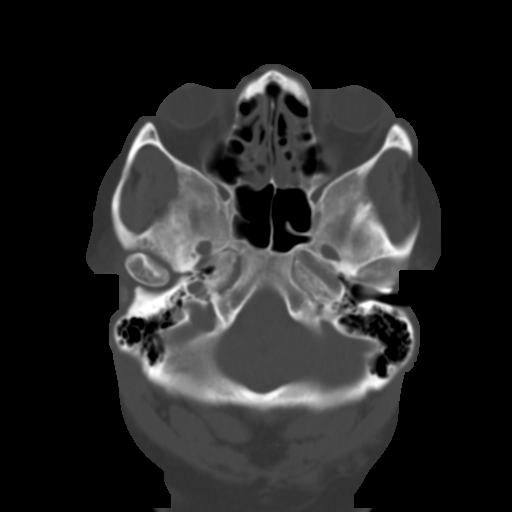
[im 6/31  brain]
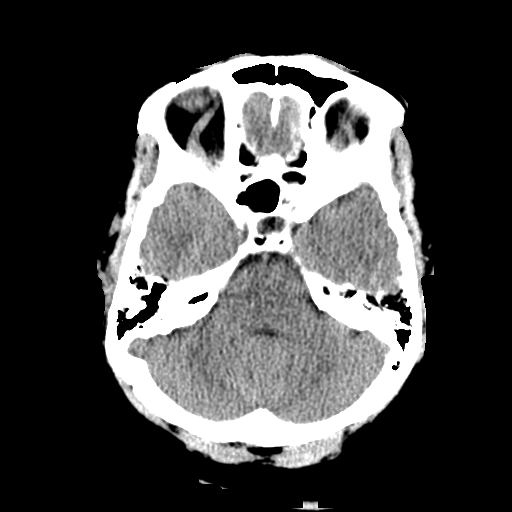
[im 9/31  brain]
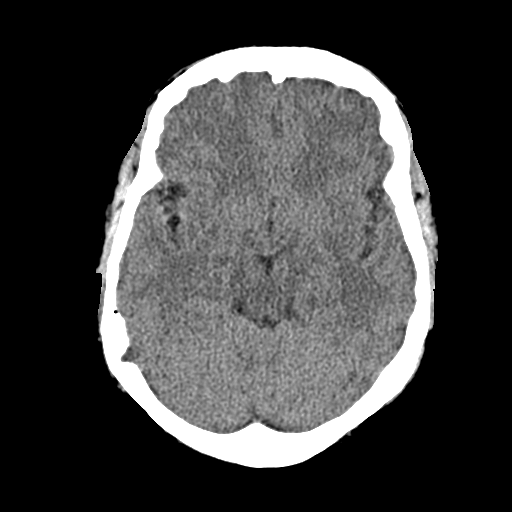
[im 11/31  brain]
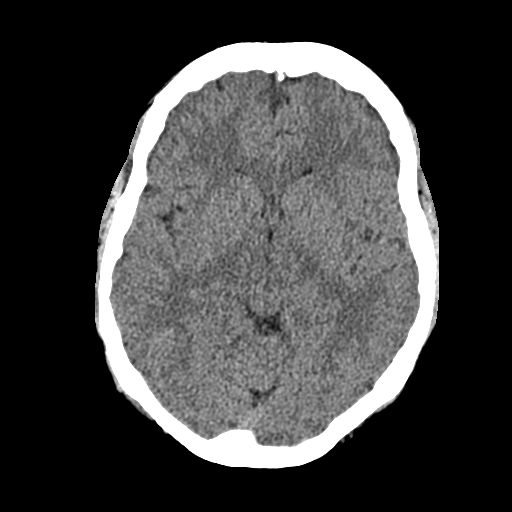
[im 14/31  brain]
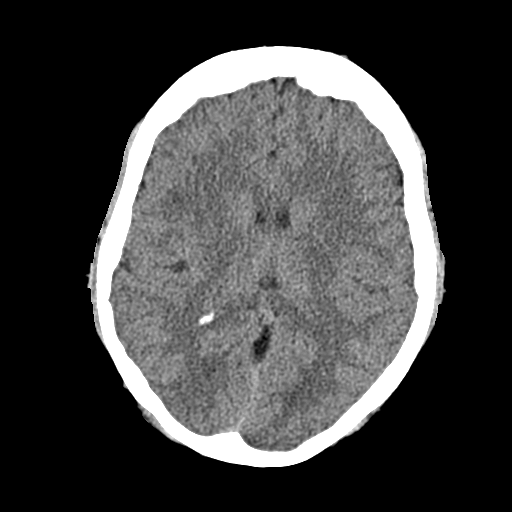
[im 14/31  bone]
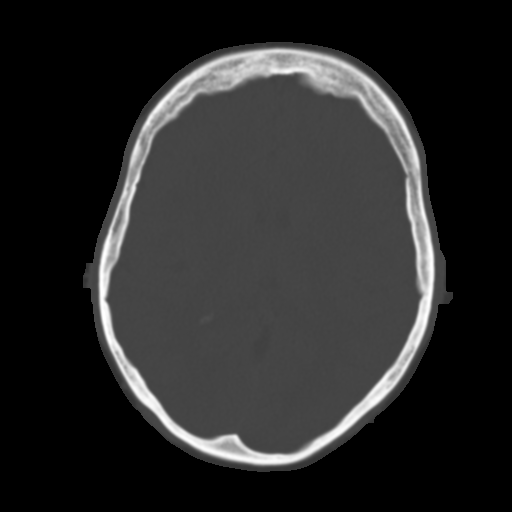
[im 17/31  brain]
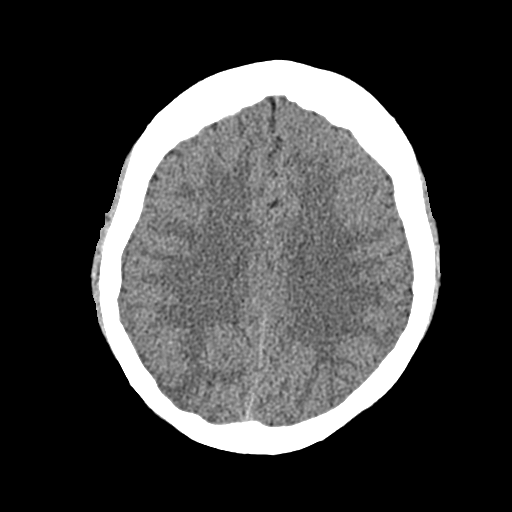
[im 20/31  brain]
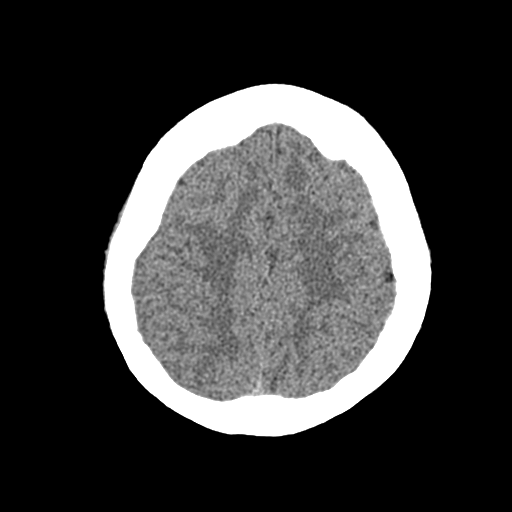
[im 23/31  brain]
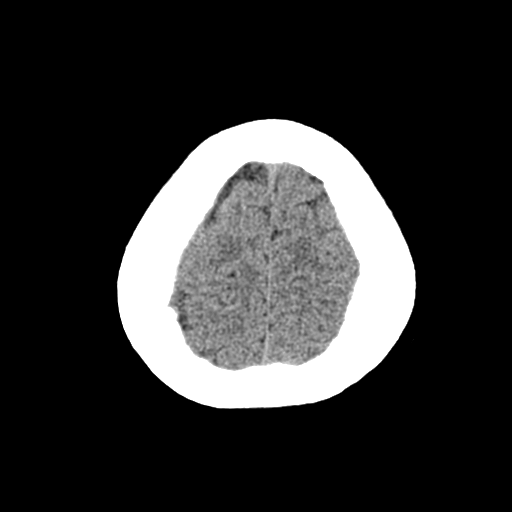
[im 25/31  brain]
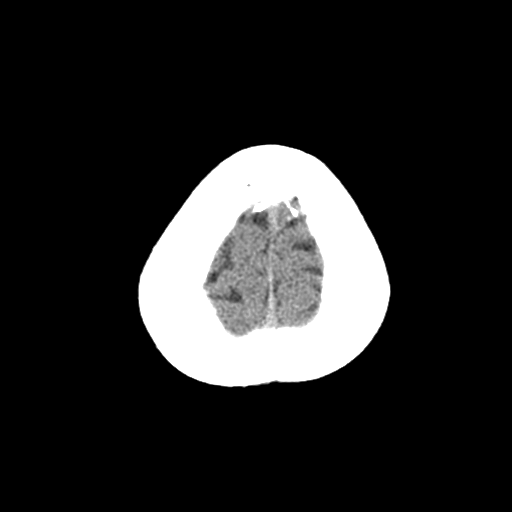
[im 25/31  bone]
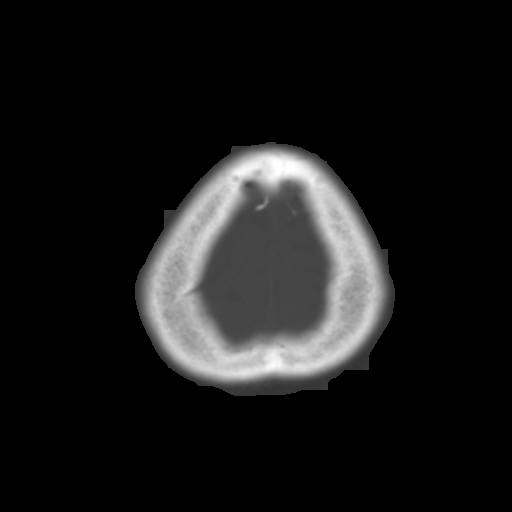
[im 28/31  brain]
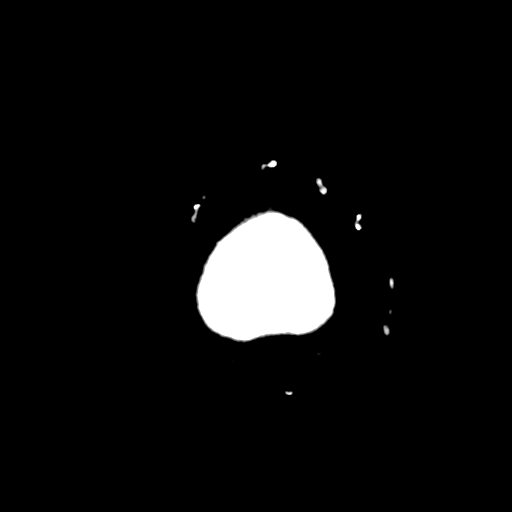

[Series 5: cor soft · coronal · 0.32mm/px · 3 of 65 slices shown]
[im 22/65  brain]
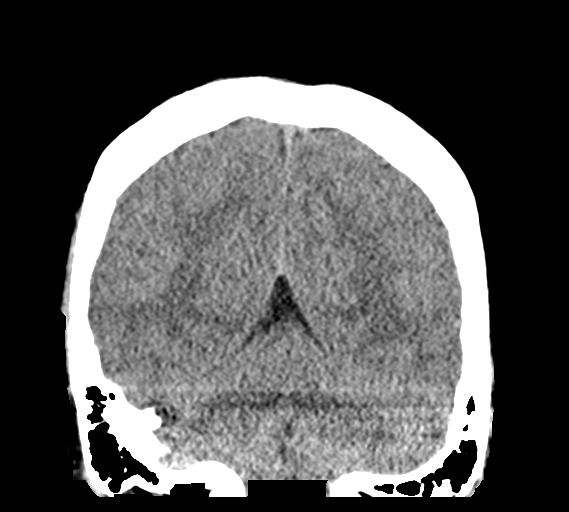
[im 29/65  brain]
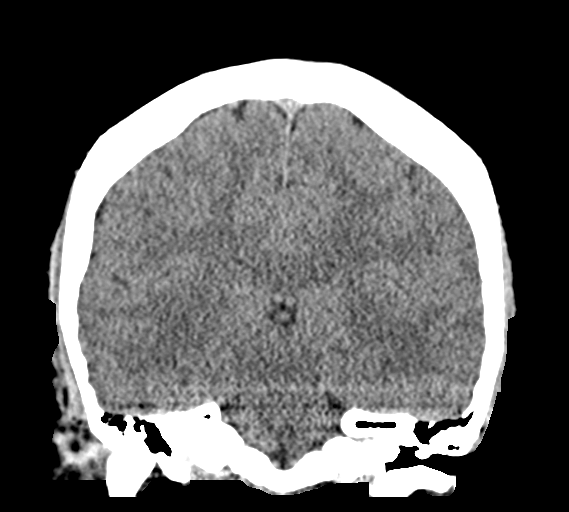
[im 36/65  brain]
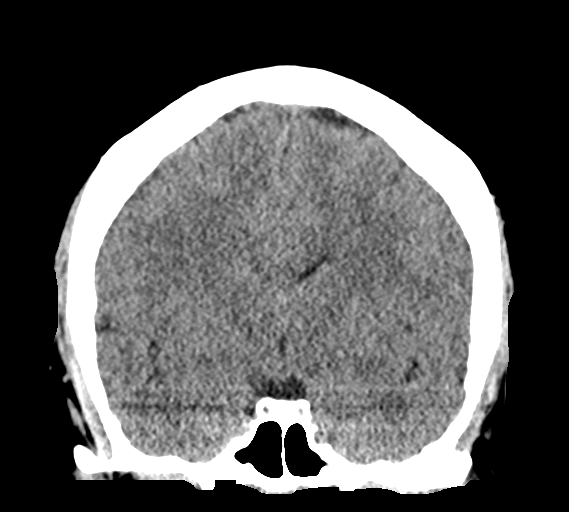

[Series 6: sag soft · sagittal · 0.33mm/px · 3 of 54 slices shown]
[im 18/54  brain]
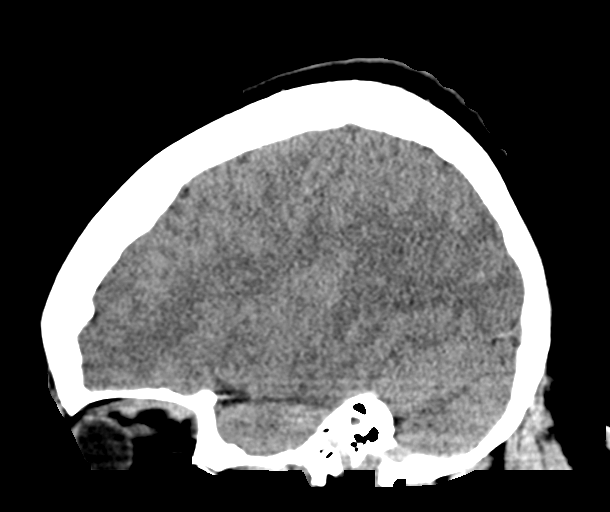
[im 27/54  brain]
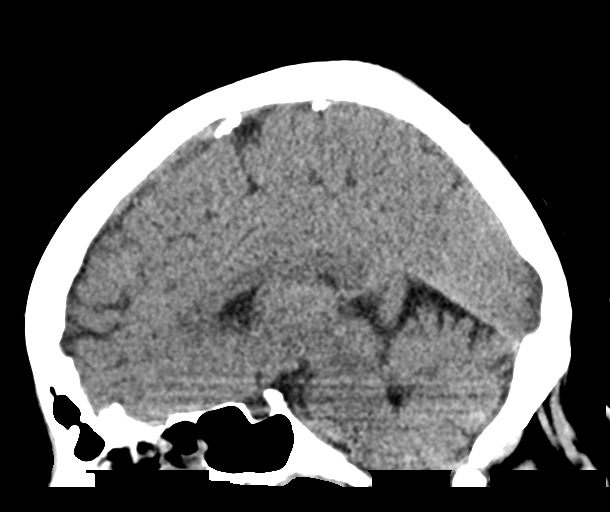
[im 36/54  brain]
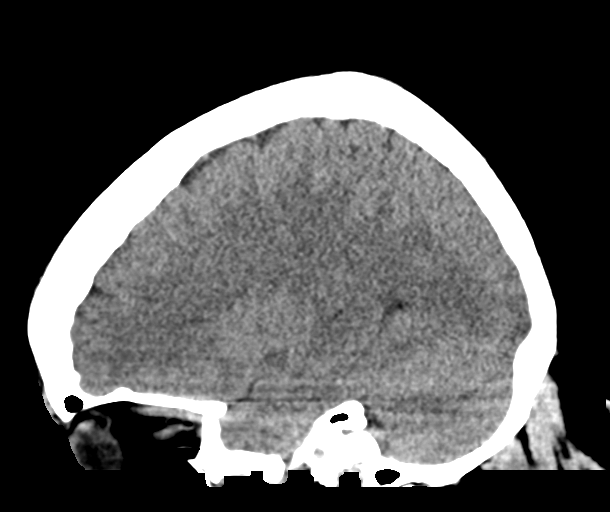

[16 of 47 positions shown; findings below may reference images not displayed]

FINDINGS: Brain: No evidence of acute large vascular territory infarction,
acute hemorrhage, hydrocephalus, extra-axial collection or mass
lesion/mass effect.

Vascular: No evidence of hyperdense vessel.

Skull: No acute fracture.

Sinuses/Orbits: Mucosal thickening of scattered ethmoid air cells.

Other: No mastoid effusions.

ASPECTS ([place] Stroke Program Early CT Score) total score (0-10
with 10 being normal): 10
IMPRESSION: 1. No evidence of acute intracranial abnormality.  ASPECTS is 10.
2. Ethmoid air cell mucosal thickening.

pm to provider Dr. EVERDIEN Via telephone, who verbally acknowledged
these results.

## 2021-01-06 MED ORDER — ACETAMINOPHEN 325 MG PO TABS
650.0000 mg | ORAL_TABLET | Freq: Four times a day (QID) | ORAL | Status: DC | PRN
  Administered 2021-01-06: 650 mg via ORAL
  Filled 2021-01-06: qty 2

## 2021-01-06 MED ORDER — VALACYCLOVIR HCL 500 MG PO TABS
1000.0000 mg | ORAL_TABLET | Freq: Every day | ORAL | Status: DC
Start: 2021-01-06 — End: 2021-01-07
  Administered 2021-01-06 – 2021-01-07 (×2): 1000 mg via ORAL
  Filled 2021-01-06 (×2): qty 2

## 2021-01-06 MED ORDER — PANTOPRAZOLE SODIUM 40 MG PO TBEC
40.0000 mg | DELAYED_RELEASE_TABLET | Freq: Every day | ORAL | Status: DC
  Administered 2021-01-06 – 2021-01-07 (×2): 40 mg via ORAL
  Filled 2021-01-06 (×2): qty 1

## 2021-01-06 MED ORDER — ONDANSETRON HCL 4 MG PO TABS: 4.0000 mg | ORAL_TABLET | Freq: Four times a day (QID) | ORAL | Status: DC | PRN

## 2021-01-06 MED ORDER — ENOXAPARIN SODIUM 40 MG/0.4ML ~~LOC~~ SOLN
40.0000 mg | SUBCUTANEOUS | Status: DC
  Administered 2021-01-06: 40 mg via SUBCUTANEOUS
  Filled 2021-01-06: qty 0.4

## 2021-01-06 MED ORDER — CLOPIDOGREL BISULFATE 300 MG PO TABS
300.0000 mg | ORAL_TABLET | Freq: Once | ORAL | Status: AC
  Administered 2021-01-06: 300 mg via ORAL
  Filled 2021-01-06: qty 1

## 2021-01-06 MED ORDER — ASPIRIN EC 325 MG PO TBEC
325.0000 mg | DELAYED_RELEASE_TABLET | Freq: Every day | ORAL | Status: DC
  Administered 2021-01-07: 325 mg via ORAL
  Filled 2021-01-06: qty 1

## 2021-01-06 MED ORDER — ATORVASTATIN CALCIUM 80 MG PO TABS
80.0000 mg | ORAL_TABLET | Freq: Every day | ORAL | Status: DC
  Administered 2021-01-06 – 2021-01-07 (×2): 80 mg via ORAL
  Filled 2021-01-06: qty 8
  Filled 2021-01-06: qty 1

## 2021-01-06 MED ORDER — CLONIDINE HCL 0.1 MG PO TABS: 0.1000 mg | ORAL_TABLET | Freq: Every day | ORAL | Status: DC | PRN

## 2021-01-06 MED ORDER — SODIUM CHLORIDE 0.9 % IV SOLN
100.0000 mL/h | INTRAVENOUS | Status: DC
  Administered 2021-01-06: 100 mL/h via INTRAVENOUS

## 2021-01-06 MED ORDER — ASPIRIN EC 325 MG PO TBEC: 325.0000 mg | DELAYED_RELEASE_TABLET | Freq: Every day | ORAL | Status: DC

## 2021-01-06 MED ORDER — LORAZEPAM 2 MG/ML IJ SOLN
0.5000 mg | Freq: Once | INTRAMUSCULAR | Status: AC | PRN
  Administered 2021-01-06: 0.5 mg via INTRAVENOUS
  Filled 2021-01-06: qty 1

## 2021-01-06 MED ORDER — DARUN-COBIC-EMTRICIT-TENOFAF 800-150-200-10 MG PO TABS
1.0000 | ORAL_TABLET | Freq: Every day | ORAL | Status: DC
  Administered 2021-01-07: 1 via ORAL
  Filled 2021-01-06 (×2): qty 1

## 2021-01-06 MED ORDER — NYSTATIN 100000 UNIT/GM EX POWD
Freq: Three times a day (TID) | CUTANEOUS | Status: DC | PRN
  Filled 2021-01-06: qty 15

## 2021-01-06 MED ORDER — ASPIRIN EC 81 MG PO TBEC: 81.0000 mg | DELAYED_RELEASE_TABLET | Freq: Every day | ORAL | Status: DC

## 2021-01-06 MED ORDER — GABAPENTIN 300 MG PO CAPS: 300.0000 mg | ORAL_CAPSULE | Freq: Three times a day (TID) | ORAL | Status: DC | PRN

## 2021-01-06 MED ORDER — METOPROLOL SUCCINATE ER 50 MG PO TB24
50.0000 mg | ORAL_TABLET | Freq: Every day | ORAL | Status: DC
  Administered 2021-01-06: 50 mg via ORAL
  Filled 2021-01-06: qty 1

## 2021-01-06 MED ORDER — ACETAMINOPHEN 650 MG RE SUPP: 650.0000 mg | Freq: Four times a day (QID) | RECTAL | Status: DC | PRN

## 2021-01-06 MED ORDER — HYDROCHLOROTHIAZIDE 25 MG PO TABS: 25.0000 mg | ORAL_TABLET | Freq: Every day | ORAL | Status: DC

## 2021-01-06 MED ORDER — SODIUM CHLORIDE 0.9 % IV BOLUS
500.0000 mL | Freq: Once | INTRAVENOUS | Status: AC
  Administered 2021-01-06: 500 mL via INTRAVENOUS

## 2021-01-06 MED ORDER — CYCLOBENZAPRINE HCL 10 MG PO TABS: 5.0000 mg | ORAL_TABLET | Freq: Three times a day (TID) | ORAL | Status: DC | PRN

## 2021-01-06 MED ORDER — POLYETHYLENE GLYCOL 3350 17 G PO PACK: 17.0000 g | PACK | Freq: Every day | ORAL | Status: DC | PRN

## 2021-01-06 MED ORDER — GADOBUTROL 1 MMOL/ML IV SOLN
9.0000 mL | Freq: Once | INTRAVENOUS | Status: AC | PRN
  Administered 2021-01-06: 9 mL via INTRAVENOUS

## 2021-01-06 MED ORDER — ASPIRIN EC 81 MG PO TBEC
81.0000 mg | DELAYED_RELEASE_TABLET | Freq: Once | ORAL | Status: AC
  Administered 2021-01-06: 81 mg via ORAL
  Filled 2021-01-06: qty 1

## 2021-01-06 MED ORDER — ONDANSETRON HCL 4 MG/2ML IJ SOLN: 4.0000 mg | Freq: Four times a day (QID) | INTRAMUSCULAR | Status: DC | PRN

## 2021-01-06 MED ORDER — CLOPIDOGREL BISULFATE 75 MG PO TABS
75.0000 mg | ORAL_TABLET | Freq: Every day | ORAL | Status: DC
  Administered 2021-01-07: 75 mg via ORAL
  Filled 2021-01-06: qty 1

## 2021-01-06 NOTE — ED Notes (Signed)
Patient transported to MRI 

## 2021-01-06 NOTE — ED Provider Notes (Signed)
[facility] EMERGENCY DEPARTMENT Provider Note   CSN: [id number] Arrival date & time: 01/06/21  1140     History Chief Complaint  Patient presents with  . Dizziness  . Numbness    [name] is a 55 y.o. female.  HPI   Patient states she had sudden onset of dizziness numbness and weakness this morning at about 9 AM.  Patient states she has been under stress and had to take another family member to the hospital last night.  She got up this morning to go to the bathroom when she had sudden onset of feeling dizzy.  Patient states her left hand felt numb and she had difficulty with her grip.  She felt like there was something abnormal in her head.  She felt off balance.  She felt like her vision was abnormal.  Patient states she did not have any weakness in her leg.  No symptoms on the right side.  Patient feels like the symptoms are much better but she did continues to have some dizziness and feels like her vision is somewhat blurred.  Patient does not have history of stroke.  She does have history of hypertension.  She does have history of HIV disease but has been compliant with her medications and no issues with that for a while.  Past Medical History:  Diagnosis Date  . Anemia   . GERD (gastroesophageal reflux disease)   . Headache    hx of migraines   . History of cocaine abuse (HCC)   . HIV disease (HCC)   . HIV infection (HCC)   . Hypertension    not on medication   . 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 Muscle spasm 05/11/2017  . Myocardial infarction (HCC)    mild MI 09/2004     no heart problems since  . Neck swelling 11/16/2017  . 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 Nonallopathic lesion of head 11/16/2017  . Shingles    2000    Patient Active Problem List   Diagnosis Date Noted  . Nonallopathic lesion of head 11/16/2017  . Hypertension 11/16/2017  . Muscle spasm 05/11/2017  . 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 Benign essential HTN 12/18/2014  . Snoring 09/17/2014  . Excessive daytime sleepiness 09/17/2014  . Apneic 09/17/2014  . Morbid obesity (HCC) 09/17/2014  . Trigeminal neuralgia 09/17/2014  . Occipital neuralgia 09/17/2014  . Migraine 08/30/2014  . Migraine headache without aura 03/05/2014  . Postmenopausal disorder 04/19/2012  . Postoperative spinal headache 01/10/2012  . Herpes zoster 12/21/2011  . Shingles 12/21/2011  . Immunization due 04/19/2011  . Scabies exposure 04/19/2011  . Postmenopausal status 02/03/2011  . Headache 04/16/2010  . NAUSEA AND VOMITING 04/16/2010  . CANDIDIASIS OF VULVA AND VAGINA 09/23/2009  . ANAL FISSURE 09/23/2009  . 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 INTERTRIGO 09/18/2009  . TRICHOMONAL INFECTION 06/03/2008  . HEMORRHOIDS, EXTERNAL 06/03/2008  . PRURITUS OF GENITAL ORGANS 06/03/2008  . MRSA INFECTION 04/22/2008  . MENOPAUSAL SYNDROME 04/22/2008  . NIGHT SWEATS 04/22/2008  . RASH AND OTHER NONSPECIFIC SKIN ERUPTION 04/22/2008  . HIV disease (HCC) 01/30/2007  . Herpes simplex virus (HSV) infection 01/30/2007    Past Surgical History:  Procedure Laterality Date  . ABDOMINAL HYSTERECTOMY  1993   Partial  . CESAREAN SECTION    . LAPAROSCOPIC ASSISTED VENTRAL HERNIA REPAIR N/A  01/02/2015   Procedure: LAPAROSCOPIC VENTRAL WALL HERNIA REPAIR;  Surgeon: Karie Soda, MD;  Location: [facility];  Service: General;  Laterality: N/A;  With MESH  . TONSILLECTOMY  1988     OB History   No obstetric history on file.     Family History  Problem Relation Age of Onset  . Diabetes type II Brother   . Migraines Neg Hx     Social History   Tobacco Use  . Smoking status: Former Smoker    Types: Cigarettes  . Smokeless tobacco: Never Used  Substance Use Topics  . Alcohol use: Yes    Comment: rare red wine   . Drug use: Yes    Frequency:  14.0 times per week    Types: Marijuana    Home Medications Prior to Admission medications   Medication Sig Start Date End Date Taking? Authorizing Provider  cloNIDine (CATAPRES) 0.1 MG tablet Take 0.1 mg by mouth at bedtime. 06/12/20   [provider]  cyclobenzaprine (FLEXERIL) 5 MG tablet TAKE 1 TABLET BY MOUTH THREE TIMES A DAY AS NEEDED FOR MUSCLE SPASMS 09/03/20   Daiva Eves, Lisette Grinder, MD  Darunavir-Cobicisctat-Emtricitabine-Tenofovir Alafenamide [facility]) 800-150-200-10 MG TABS Take 1 tablet by mouth daily with breakfast. 09/03/20   Daiva Eves, Lisette Grinder, MD  gabapentin (NEURONTIN) 300 MG capsule TAKE 1 CAPSULE BY MOUTH THREE TIMES A DAY 09/03/20   Randall Hiss, MD  hydrochlorothiazide (HYDRODIURIL) 25 MG tablet TAKE 1 TABLET BY MOUTH EVERY DAY 04/14/20   Daiva Eves, Lisette Grinder, MD  metoprolol succinate (TOPROL-XL) 50 MG 24 hr tablet TAKE 1 TABLET BY MOUTH EVERYDAY AT BEDTIME 06/02/20   Daiva Eves, Lisette Grinder, MD  nystatin (MYCOSTATIN/NYSTOP) powder Apply topically. 11/07/20   [provider]  omeprazole (PRILOSEC) 20 MG capsule TAKE 1 CAPSULE BY MOUTH TWICE A DAY 12/01/20   Daiva Eves, Lisette Grinder, MD  valACYclovir (VALTREX) 1000 MG tablet Take 1 tablet (1,000 mg total) by mouth daily. 09/03/20   Randall Hiss, MD  pantoprazole sodium (PROTONIX) 40 mg/20 mL PACK Place 20 mLs (40 mg total) into feeding tube daily at 12 noon. 01/05/12 01/10/12  Randall Hiss, MD    Allergies    Morphine and Doxycycline hyclate  Review of Systems   Review of Systems  All other systems reviewed and are negative.   Physical Exam Updated Vital Signs BP (!) 158/82   Pulse 78   Temp 98.3 F (36.8 C) (Oral)   Resp 15   Ht 1.626 m (5\' 4" )   Wt 100.7 kg   SpO2 93%   BMI 38.11 kg/m   Physical Exam Vitals and nursing note reviewed.  Constitutional:      General: She is not in acute distress.    Appearance: She is well-developed and well-nourished.  HENT:     Head:  Normocephalic and atraumatic.     Right Ear: External ear normal.     Left Ear: External ear normal.     Mouth/Throat:     Mouth: Oropharynx is clear and moist.  Eyes:     General: No scleral icterus.       Right eye: No discharge.        Left eye: No discharge.     Conjunctiva/sclera: Conjunctivae normal.  Neck:     Trachea: No tracheal deviation.  Cardiovascular:     Rate and Rhythm: Normal rate and regular rhythm.     Pulses: Intact distal pulses.  Pulmonary:  Effort: Pulmonary effort is normal. No respiratory distress.     Breath sounds: Normal breath sounds. No stridor. No wheezing or rales.  Abdominal:     General: Bowel sounds are normal. There is no distension.     Palpations: Abdomen is soft.     Tenderness: There is no abdominal tenderness. There is no guarding or rebound.  Musculoskeletal:        General: No tenderness or edema.     Cervical back: Neck supple.  Skin:    General: Skin is warm and dry.     Findings: No rash.  Neurological:     Mental Status: She is alert and oriented to person, place, and time.     Cranial Nerves: No cranial nerve deficit (No facial droop, extraocular movements intact, tongue midline ).     Sensory: No sensory deficit.     Motor: No abnormal muscle tone or seizure activity.     Coordination: Coordination normal.     Deep Tendon Reflexes: Strength normal.     Comments: No pronator drift bilateral upper extrem, able to hold both legs off bed for 5 seconds, sensation intact in all extremities, no visual field cuts, no left or right sided neglect, normal finger-nose exam bilaterally, no nystagmus noted   Psychiatric:        Mood and Affect: Mood and affect normal.     ED Results / Procedures / Treatments   Labs (all labs ordered are listed, but only abnormal results are displayed) Labs Reviewed  CBC - Abnormal; Notable for the following components:      Result Value   Platelets 406 (*)    All other components within normal  limits  RESP PANEL BY RT-PCR (FLU A&B, COVID) ARPGX2  SARS CORONAVIRUS 2 (HOSPITAL ORDER, PERFORMED IN [facility])  PROTIME-INR  APTT  DIFFERENTIAL  ETHANOL  RAPID URINE DRUG SCREEN, HOSP PERFORMED  URINALYSIS, ROUTINE W REFLEX MICROSCOPIC  PREGNANCY, URINE    EKG EKG Interpretation  Date/Time:  Tuesday January 06 2021 12:00:35 EST Ventricular Rate:  77 PR Interval:    QRS Duration: 76 QT Interval:  404 QTC Calculation: 458 R Axis:   141 Text Interpretation: Right and left arm electrode reversal, otherwise no significant change Normal sinus rhythm Confirmed by Linwood Dibbles [phone]) on 01/06/2021 12:04:20 PM   Radiology CT HEAD CODE STROKE WO CONTRAST  Result Date: 01/06/2021 CLINICAL DATA:  Code stroke.  Dizziness.  Left-sided numbness. EXAM: CT HEAD WITHOUT CONTRAST TECHNIQUE: Contiguous axial images were obtained from the base of the skull through the vertex without intravenous contrast. COMPARISON:  CT head December 23, 2011. FINDINGS: Brain: No evidence of acute large vascular territory infarction, acute hemorrhage, hydrocephalus, extra-axial collection or mass lesion/mass effect. Vascular: No evidence of hyperdense vessel. Skull: No acute fracture. Sinuses/Orbits: Mucosal thickening of scattered ethmoid air cells. Other: No mastoid effusions. ASPECTS [facility] Stroke Program Early CT Score) total score (0-10 with 10 being normal): 10 IMPRESSION: 1. No evidence of acute intracranial abnormality.  ASPECTS is 10. 2. Ethmoid air cell mucosal thickening. Code stroke imaging results were communicated on 01/06/2021 at 12:33 pm to provider Dr. Lynelle Doctor Via telephone, who verbally acknowledged these results. Electronically Signed   By: Feliberto Harts MD   On: 01/06/2021 12:37    Procedures .Critical Care Performed by: Linwood Dibbles, MD Authorized by: Linwood Dibbles, MD   Critical care provider statement:    Critical care time (minutes):  30   Critical care was time  spent personally by  me on the following activities:  Discussions with consultants, evaluation of patient's response to treatment, examination of patient, ordering and performing treatments and interventions, ordering and review of laboratory studies, ordering and review of radiographic studies, pulse oximetry, re-evaluation of patient's condition, obtaining history from patient or surrogate and review of old charts     Medications Ordered in ED Medications  sodium chloride 0.9 % bolus 500 mL (500 mLs Intravenous New Bag/Given 01/06/21 1307)    Followed by  0.9 %  sodium chloride infusion (has no administration in time range)  LORazepam (ATIVAN) injection 0.5 mg (has no administration in time range)    ED Course  I have reviewed the triage vital signs and the nursing notes.  Pertinent labs & imaging results that were available during my care of the patient were reviewed by me and considered in my medical decision making (see chart for details).  Clinical Course as of 01/06/21 1312  Tue Jan 06, 2021  1221 Symptoms concerning for the possibility of stroke, TIA.  Patient's exam seems to be improving but she does have some residual complaint..  No definite deficits noted on exam at this time.  Not likely going to be a TPA candidate but will proceed with acute stroke protocol [JK]  1233 No acute findings with CT [JK]  1308 Patient evaluated by neurology.  Patient would benefit from MRI MRA.  If negative she can go home.  That is not available at this facility.  Patient will need to be transferred. [JK]  1308 Notified Dr Rubin PayorPickering of transfer and plan. [JK]    Clinical Course User Index [JK] Linwood DibblesKnapp, Jon, MD   MDM Rules/Calculators/A&P               NIH Stroke Scale: 0           Patient with acute onset of numbness and dizziness.  Patient also describes weakness in her left upper extremity.  Symptoms have mostly resolved.  NIH stroke scale is 0 on my exam.  However with her persistent complaints code stroke was  initiated.  Patient was evaluated by neurology via telehealth here in the emergency department.  No indications for TPA at this time.  Patient however would benefit from MRI/MRA.  If negative she could be discharged.  That is not available at this freestanding ED so [facility].   Final Clinical Impression(s) / ED Diagnoses Final diagnoses:  TIA (transient ischemic attack)      Linwood DibblesKnapp, Jon, MD 01/06/21 1313

## 2021-01-06 NOTE — H&P (Addendum)
Date: 01/06/2021               Patient Name:  [name] MRN: [id number]  DOB: 1966-08-27 Age / Sex: 55 y.o., female   PCP: Verdell Face, DO         Medical Service: Internal Medicine Teaching Service         Attending Physician: Dr. Oswaldo Done, Marquita Palms, *    First Contact: Dr. Austin Miles Pager: [phone]  Second Contact: Dr. Barbaraann Faster Pager: [phone]       After Hours (After 5p/  First Contact Pager: [phone]  weekends / holidays): Second Contact Pager: [phone]   Chief Complaint: Facial numbness and weakness  History of Present Illness:  [name] is a 55 year old person living with HIV on symtuza, hypertension, GERD who presents with facial numbness, left sided numbness, and blurred vision  around 10 am this morning. She was walking out of the bath room when she suddenly lightheaded and stubled into the walll. She immediately sat down after bumping into the wall. She picked up her phone and noted her vision was blurred. Endorsed numbness of the left face her left upper and lower extremity. She was able to move her left upper extremity but unable to make a fist. She called her PCP and informed her this could be a mini stroke and advised her to go to the ED.   Subsequently went to [facility] ED. Seen by neurology telehealth. Transfered to [facility] ED for MRI/MRA. States numbness gradually improved. Feels facial numbness resolved while at [facility] ED around about 7 pm. Now feels sensation and vision are back to normal. She endorses a headache. She describes a nagging achy pain in her temples. She has sharp pains in her head and left eye in the past, none currently. Often feels this way when her BP is hight, states she did not take her meds today. Denies fever, chills, chest pain, dyspnea, abdominal pain, diarrhea or constipation, dysuria, swelling, weakness, or LOC. States she often get leg cramps for which she takes gabapentin and muscle relaxers, feels it improved with  drinking more water. No changes in her medications recently. She takes all her medications as prescribed.   Meds: Current Meds  Medication Sig  . aspirin EC 81 MG tablet Take 81 mg by mouth daily. Swallow whole.  . Darunavir-Cobicisctat-Emtricitabine-Tenofovir Alafenamide (SYMTUZA) 800-150-200-10 MG TABS Take 1 tablet by mouth daily with breakfast.  . hydrochlorothiazide (HYDRODIURIL) 25 MG tablet TAKE 1 TABLET BY MOUTH EVERY DAY  . metoprolol succinate (TOPROL-XL) 50 MG 24 hr tablet TAKE 1 TABLET BY MOUTH EVERYDAY AT BEDTIME (Patient taking differently: Take 50 mg by mouth daily.)  . omeprazole (PRILOSEC) 20 MG capsule TAKE 1 CAPSULE BY MOUTH TWICE A DAY  . valACYclovir (VALTREX) 1000 MG tablet Take 1 tablet (1,000 mg total) by mouth daily.    Allergies: Allergies as of 01/06/2021 - Review Complete 01/06/2021  Allergen Reaction Noted  . Morphine Other (See Comments) 01/30/2007  . Doxycycline hyclate Rash 09/18/2009   Past Medical History:  Diagnosis Date  . Anemia   . GERD (gastroesophageal reflux disease)   . Headache    hx of migraines   . History of cocaine abuse (HCC)   . HIV disease (HCC)   . HIV infection (HCC)   . Hypertension    not on medication   . 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 Muscle spasm 05/11/2017  . Myocardial infarction (  HCC)    mild MI 09/2004     no heart problems since  . Neck swelling 11/16/2017  . 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 Nonallopathic lesion of head 11/16/2017  . Shingles    2000    Family History: Family history of strokes, hypertension and DM. History of stomach cancer.   Family History  Problem Relation Age of Onset  . Diabetes type II Brother   . Migraines Neg Hx     Social History: Lives in [place] with her husband, mother and grandson. Drinks alcohol occasionally, not everyday. Smokes marijuana daily.  She used to be a chronic smoker. Quit 13 years ago.   Review of Systems: A  complete ROS was negative except as per HPI.   Physical Exam: Blood pressure 139/79, pulse 85, temperature 98.2 F (36.8 C), temperature source Oral, resp. rate 14, height 5\' 4"  (1.626 m), weight 100.7 kg, SpO2 100 %. Physical Exam Constitutional:      General: She is not in acute distress.    Appearance: Normal appearance. She is obese.  HENT:     Head: Normocephalic and atraumatic.     Right Ear: External ear normal.     Left Ear: External ear normal.     Nose: Nose normal.     Mouth/Throat:     Mouth: Mucous membranes are moist.     Pharynx: Oropharynx is clear.  Eyes:     Extraocular Movements: Extraocular movements intact.     Conjunctiva/sclera: Conjunctivae normal.     Pupils: Pupils are equal, round, and reactive to light.  Cardiovascular:     Rate and Rhythm: Normal rate and regular rhythm.     Heart sounds: No murmur heard. No friction rub. No gallop.   Pulmonary:     Effort: Pulmonary effort is normal. No respiratory distress.     Breath sounds: Normal breath sounds. No wheezing.  Abdominal:     General: Abdomen is flat. Bowel sounds are normal. There is no distension.     Palpations: Abdomen is soft.  Musculoskeletal:        General: No signs of injury. Normal range of motion.     Cervical back: Normal range of motion and neck supple.     Right lower leg: No edema.     Left lower leg: No edema.  Skin:    General: Skin is warm and dry.     Capillary Refill: Capillary refill takes less than 2 seconds.     Findings: No rash.  Neurological:     Mental Status: She is alert and oriented to person, place, and time. Mental status is at baseline.     Cranial Nerves: Cranial nerves are intact. No dysarthria or facial asymmetry.     Sensory: Sensation is intact.     Motor: No weakness.     Coordination: Finger-Nose-Finger Test normal.  Psychiatric:        Behavior: Behavior normal.     Comments: intermittently tearful when recalling morning events    EKG:  personally reviewed my interpretation is left and right arm lead reversal, normal sinus rhythm   CT HEAD CODE STROKE WO CONTRAST  Result Date: 01/06/2021  IMPRESSION: 1. No evidence of acute intracranial abnormality.  ASPECTS is 10. 2. Ethmoid air cell mucosal thickening. Code stroke imaging results were communicated on 01/06/2021 at 12:33 pm to provider Dr. 03/08/2021 Via telephone, who verbally acknowledged these results. Electronically Signed   By: Lynelle Doctor MD  On: 01/06/2021 12:37   MRI HEAD WITHOUT CONTRAST MRA HEAD WITHOUT CONTRAST MRA NECK WITHOUT AND WITH CONTRAST IMPRESSION: No acute infarction, hemorrhage, or mass. Mild chronic microvascular ischemic changes.  Intracranial internal carotid arteries are patent. Middle and anterior cerebral arteries are patent. Intracranial vertebral arteries and basilar artery are patent. Right posterior communicating artery is present. Right P1 PCA is patent. There is a 6 mm segment of occlusion or high-grade stenosis of the proximal right P2 PCA. Left posterior cerebral artery is patent. There is no Aneurysm.  Common, internal, and external carotid arteries are patent. Codominant extracranial vertebral arteries are patent. No stenosis or evidence of dissection.  Assessment & Plan by Problem: Principal Problem:   TIA (transient ischemic attack) Active Problems:   Transient ischemic attack  [name] is a 55 year old person living with HIV on symtuza, hypertension, GERD who presents with dizziness, left facial numbness, left upper and lower extremity numbness and blurred vision that has subsequently resolved likely due to TIA from a right PCA occlusion.  Transient ischemic attack from right PCA oscculsion Patient presented after sudden onset numbness of the left face and extermity with associated blurred vision. Symptoms have subsequently resolved, now feels back to her baseline. Initially presented the [facility] ED  with resolving symptoms. Evaluated by neurology telehealth service. NIHSS of 0 and modified Rankin Scale 0. CT head was negative. No indication for TPA. Transferred to [facility] ED for MRI and MRA. MRI without hemorrhage or infarction. MRA head and neck with patent internal and external carotid arteries, vertebral arteries, and basilar artery, right P2 PCA occlusion/stenosis that is likely causing her symptoms. Currently on ASA 81 at home, started on Plavix and atorvastatin. Neurology will plan to see in the morning. - Neurology consulted, appreciate recommendations - Allow for permissive hypertension for 24-48 hours, hold home metoprolol, HCTZ, and clonidine tonight -ASA 325 tomorrow - Plavix 75 mg daily - Continue atorvastatin 80 mg daily - Lipid panel, A1c ordered - Neuro checks q4, consider CTA and CTP if having more symptoms for possible thrombectomy - monitor on tele  Hypertension BP of 165/100 on admission improved spontaneously to 139/79 on exam. Home medications clonidine 0.1 mg daily, HCTZ 25 mg daily, and metoprolol 50 mg daily, has not taken her medications today.  - Consider restarting home medications tomorrow - Monitor vitals  GERD On omeprazole 20 mg daily at home. Continue on protonix  HIV Diagnosed in 2005. Follows with Dr. Daiva Eves. On Symtuza and prophylactic valcyclovir. Reports compliance with mediations. - Continue Symtuza and valacyclovir  Muscle spasms - Continue flexeril PRN and gabapentin  Diet: Hearth healthy Fluid: None VTE: Lovenox Code: full  Dispo: Admit patient to Inpatient with expected length of stay greater than 2 midnights.  Signed: Quincy Simmonds, MD 01/06/2021, 8:51 PM  Pager: [phone] After 5pm on weekdays and 1pm on weekends: On Call pager: [phone]

## 2021-01-06 NOTE — ED Notes (Addendum)
Was at the hospital with  Her family last night , has been under a lot of stress, went to [facility] this am and got dizzy, blurred vision , and left hand and leg went numb  But not feet ,  And tingling ,  Feels like she has h/a  , no  N/v.  Now has full feeling in face  Left side of body ,

## 2021-01-06 NOTE — ED Provider Notes (Signed)
55 year old female transferred from med [facility] for sudden onset of dizziness numbness weakness.  She is here to get an MRI.  Patient was evaluated by Dr. Roda Shutters via teleneurology.  Recommendations were if imaging unremarkable discharge with neurology follow-up.  Patient complaining of headache but states her numbness is resolved.  Patient has no stroke on MRI but does have a P2 probable occlusion.  Dr.Xu message me that he is recommending the patient be admitted for TIA work-up.  I reviewed this with the patient and she is agreeable to plan.   Terrilee Files, MD 01/07/21 [id number]

## 2021-01-06 NOTE — Consult Note (Addendum)
[facility] Telemedicine Consult   Requesting Provider: Dr. Lynelle Doctor  Chief Complaint: dizziness, off balance, left sided numbness.   HPI:  [name] is a 55 y.o. African American female with PMH of HIV and HTN presented to ED for dizziness, off balance, left sided numbness. She stated that she was just out of bathroom at 9:30am and washing up, she had acute onset dizziness, very lightheaded, off balance, not walking straight. Then she felt left face numbness like novocain injection, then spread to whole left arm and left leg. Moments later, she also had right sided headache. She felt some heaviness of left side too but not frank weakness. She denies any visual or speech change. She came to ER for evaluation. BP 165/100 and glucosse 101. In ER, her symptoms gradually getting better although still has some HA.  She has HIV but on HARRT treatment. She is on BP meds. She is stressful recently as her family member is in the hospital. She stated that she takes ASA 81 at home but has not taken today.   LSN: 9:30am tPA Given: No: NIHSS = 0, symptoms resolving, likely not stroke IR Thrombectomy? No, NIHSS = 0 Modified Rankin Scale: 0-Completely asymptomatic and back to baseline post- stroke   Exam: Vitals:   01/06/21 1152 01/06/21 1245  BP: (!) 165/100 (!) 158/82  Pulse: 87 78  Resp: 18 15  Temp:    SpO2: 93% 93%     Temp:  [98.3 F (36.8 C)] 98.3 F (36.8 C) (03/08 1152) Pulse Rate:  [78-87] 78 (03/08 1245) Resp:  [15-18] 15 (03/08 1245) BP: (158-165)/(82-100) 158/82 (03/08 1245) SpO2:  [93 %] 93 % (03/08 1245) Weight:  [100.7 kg] 100.7 kg (03/08 1148)  General - well nourished, well developed, in no apparent distress.    Ophthalmologic - fundi not visualized due to noncooperation.    Cardiovascular - regular rhythm and rate  Mental Status -  Level of arousal and orientation to time, place, and person were intact. Language including expression, naming,  repetition, comprehension was assessed and found intact. Fund of Knowledge was assessed and was intact.  Cranial Nerves II - XII - II - Vision intact OU. III, IV, VI - Extraocular movements intact. V - Facial sensation intact bilaterally. VII - Facial movement intact bilaterally. VIII - Hearing & vestibular intact bilaterally. X - Palate elevates symmetrically. XI - Chin turning & shoulder shrug intact bilaterally. XII - Tongue protrusion intact.  Motor Strength - The patient's strength was normal in all extremities and pronator drift was absent.   Motor Tone & Bulk - Muscle tone was assessed at the neck and appendages and was normal.  Bulk was normal and fasciculations were absent.   Reflexes - The patient's reflexes were normal in all extremities and she had no pathological reflexes.  Sensory - Light touch, temperature/pinprick were assessed and were symmetrical.    Coordination - The patient had normal movements in the hands with no ataxia or dysmetria.  Tremor was absent.  Gait and Station - deferred  NIHSS = 0   Imaging Reviewed:  CT HEAD CODE STROKE WO CONTRAST  Result Date: 01/06/2021 CLINICAL DATA:  Code stroke.  Dizziness.  Left-sided numbness. EXAM: CT HEAD WITHOUT CONTRAST TECHNIQUE: Contiguous axial images were obtained from the base of the skull through the vertex without intravenous contrast. COMPARISON:  CT head December 23, 2011. FINDINGS: Brain: No evidence of acute large vascular territory infarction, acute hemorrhage, hydrocephalus, extra-axial collection or mass lesion/mass effect. Vascular:  No evidence of hyperdense vessel. Skull: No acute fracture. Sinuses/Orbits: Mucosal thickening of scattered ethmoid air cells. Other: No mastoid effusions. ASPECTS [facility] Stroke Program Early CT Score) total score (0-10 with 10 being normal): 10 IMPRESSION: 1. No evidence of acute intracranial abnormality.  ASPECTS is 10. 2. Ethmoid air cell mucosal thickening. Code stroke  imaging results were communicated on 01/06/2021 at 12:33 pm to provider Dr. Lynelle Doctor Via telephone, who verbally acknowledged these results. Electronically Signed   By: Feliberto Harts MD   On: 01/06/2021 12:37    Assessment:  [name] is a 54 y.o. African American female with PMH of HIV and HTN presented to ED for dizziness, off balance, left sided numbness. Time onset 9:30am. Currently pt symptom resolved, feeling better but still has mild to moderate right sided HA. NIHSS = 0. Pt not tPA candidate as NIHSS =0, pt symptom resolved and likely not stroke. Not IR candidate given NIHSS = 0. Etiology for pt symptoms likely due to TIA vs. anxiety vs. Complicated headache. Given her risk factors, would like to have MRI and MRA to rule out stroke. Will transfer her to [facility] ED to complete the study.   Recommendations:   Transfer to [facility] ED to complete work up  Recommend MRI brain, MRA head and neck  If above study negative, and she is able to ambulate in ED without difficulty, she can be d/c from neuro standpoint with close outpt neurology follow up.   Continue home ASA 81, pt has not taken today yet, consider to give her today's dose.   HA management per EDP  Case discussed with Dr. Lynelle Doctor   Consult Participants: pt, RN, stroke RN  Location of the provider: [facility] office Location of the patient: [facility]  This consult was provided via telemedicine with 2-way video and audio communication. The patient/family was informed that care would be provided in this way and agreed to receive care in this manner.   This patient is receiving care for possible acute neurological changes. There was 50 minutes of care by this provider at the time of service, including time for direct evaluation via telemedicine, review of medical records, imaging studies and discussion of findings with providers, the patient and/or family.  Marvel Plan, MD PhD Stroke Neurology 01/06/2021 1:14 PM

## 2021-01-06 NOTE — ED Notes (Signed)
Code Stroke cart placed in room

## 2021-01-06 NOTE — ED Notes (Signed)
Pt given water, Dr. Charm Barges aware.

## 2021-01-06 NOTE — ED Notes (Signed)
Patient transported to CT 

## 2021-01-06 NOTE — Plan of Care (Signed)
MRI and MRA done. Pt has no acute infarct, however, she did have right P2 occlusion with distal reconstitution but decreased flow. Discussed with Dr. Charm Barges, pt now still has HA but no neuro deficit, no numbness or vision changes. Her symptoms this afternoon likely TIA from right PCA territory especially the right right thalamus. Recommend to medicine team admit for TIA and LVO work up.   Pt had ASA 81 already, will give plavix 300mg  load and then plavix 75 daily from tomorrow. Continue ASA 325mg  tomorrow for LVO. Recommend close neuro check, if neuro changes, may need to consider CTA and CTP for thrombectomy. Will sign out to neuro team on call.   , MD PhD Stroke Neurology 01/06/2021 6:48 PM

## 2021-01-06 NOTE — ED Triage Notes (Signed)
Woke with dizziness, she had sudden onset on numbness in the left side of her body that resolved in 20 minutes.

## 2021-01-06 NOTE — ED Notes (Signed)
Vitals attempted- pt out of room to MRI

## 2021-01-06 NOTE — ED Notes (Signed)
Pt up to ambulate to BR w/o help to get urine

## 2021-01-06 NOTE — ED Notes (Addendum)
Pt back from MRI 

## 2021-01-07 ENCOUNTER — Inpatient Hospital Stay (HOSPITAL_COMMUNITY): Payer: Medicare Other

## 2021-01-07 ENCOUNTER — Encounter (HOSPITAL_COMMUNITY): Payer: Self-pay | Admitting: Student in an Organized Health Care Education/Training Program

## 2021-01-07 ENCOUNTER — Inpatient Hospital Stay (HOSPITAL_BASED_OUTPATIENT_CLINIC_OR_DEPARTMENT_OTHER): Payer: Medicare Other

## 2021-01-07 DIAGNOSIS — G459 Transient cerebral ischemic attack, unspecified: Secondary | ICD-10-CM

## 2021-01-07 DIAGNOSIS — I63331 Cerebral infarction due to thrombosis of right posterior cerebral artery: Secondary | ICD-10-CM | POA: Diagnosis not present

## 2021-01-07 LAB — ECHOCARDIOGRAM COMPLETE
AR max vel: 1.49 cm2
AV Area VTI: 1.49 cm2
AV Area mean vel: 1.74 cm2
AV Mean grad: 6 mmHg
AV Peak grad: 13.5 mmHg
Ao pk vel: 1.84 m/s
Area-P 1/2: 3.21 cm2
Height: 64 in
MV VTI: 1.83 cm2
S' Lateral: 2.7 cm
Weight: 3552 oz

## 2021-01-07 LAB — CBC
HCT: 35.5 % — ABNORMAL LOW (ref 36.0–46.0)
Hemoglobin: 11.5 g/dL — ABNORMAL LOW (ref 12.0–15.0)
MCH: 28.8 pg (ref 26.0–34.0)
MCHC: 32.4 g/dL (ref 30.0–36.0)
MCV: 88.8 fL (ref 80.0–100.0)
Platelets: 367 10*3/uL (ref 150–400)
RBC: 4 MIL/uL (ref 3.87–5.11)
RDW: 13.2 % (ref 11.5–15.5)
WBC: 11.5 10*3/uL — ABNORMAL HIGH (ref 4.0–10.5)
nRBC: 0 % (ref 0.0–0.2)

## 2021-01-07 LAB — BASIC METABOLIC PANEL
Anion gap: 11 (ref 5–15)
BUN: 13 mg/dL (ref 6–20)
CO2: 22 mmol/L (ref 22–32)
Calcium: 8.9 mg/dL (ref 8.9–10.3)
Chloride: 104 mmol/L (ref 98–111)
Creatinine, Ser: 0.9 mg/dL (ref 0.44–1.00)
GFR, Estimated: >60 mL/min (ref >60)
Glucose, Bld: 105 mg/dL — ABNORMAL HIGH (ref 70–99)
Potassium: 3.6 mmol/L (ref 3.5–5.1)
Sodium: 137 mmol/L (ref 135–145)

## 2021-01-07 IMAGING — CT CT ANGIO HEAD
3 of 11 series · 9 of 47 positions shown · IV contrast (omnipaque)
Comparison: MRI of the brain [DATE].

CLINICAL DATA: Transient ischemic attack.

EXAM:
CT ANGIOGRAPHY HEAD
TECHNIQUE: Multidetector CT imaging of the head was performed using the
standard protocol during bolus administration of intravenous
contrast. Multiplanar CT image reconstructions and MIPs were
obtained to evaluate the vascular anatomy.
CONTRAST:  50mL OMNIPAQUE IOHEXOL 350 MG/ML SOLN

[Series 4: head bone · axial · 0.43mm/px · z∈[-55,+35]mm · 4 of 75 slices shown]
[im 15/75  bone]
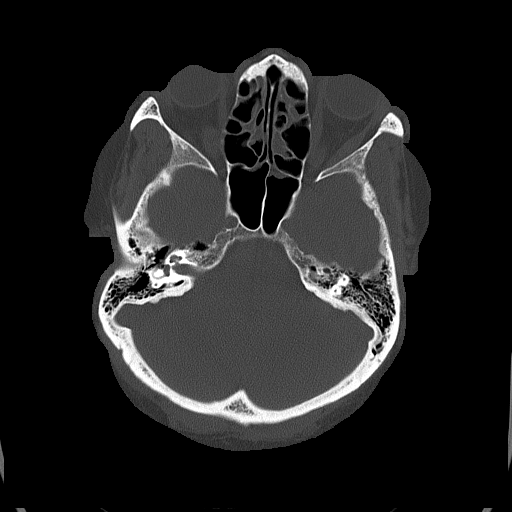
[im 30/75  bone]
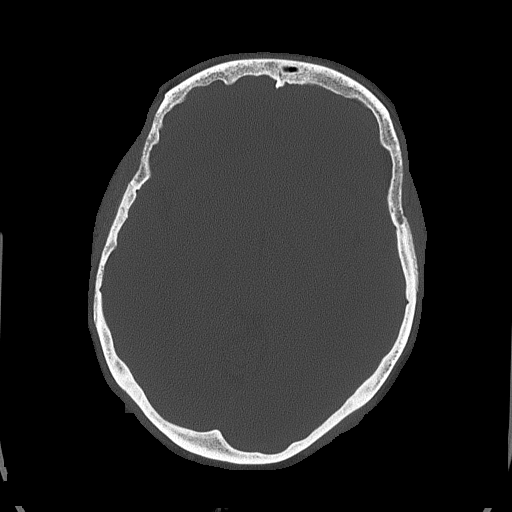
[im 45/75  bone]
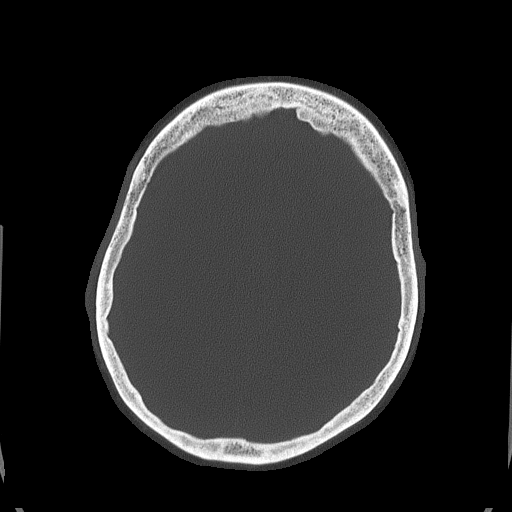
[im 60/75  bone]
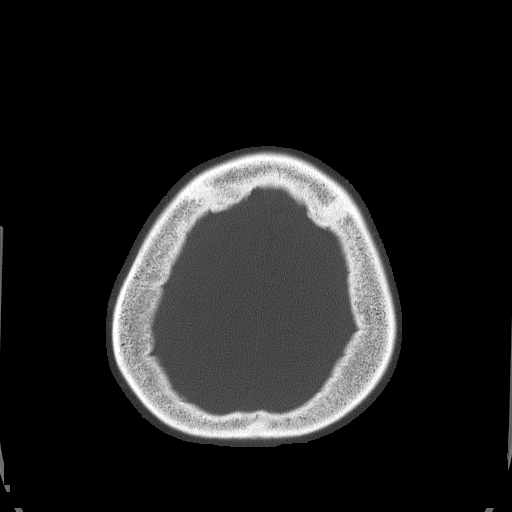

[Series 7: coronal · coronal · 0.33mm/px · 1 of 64 slices shown]
[im 32/64  brain]
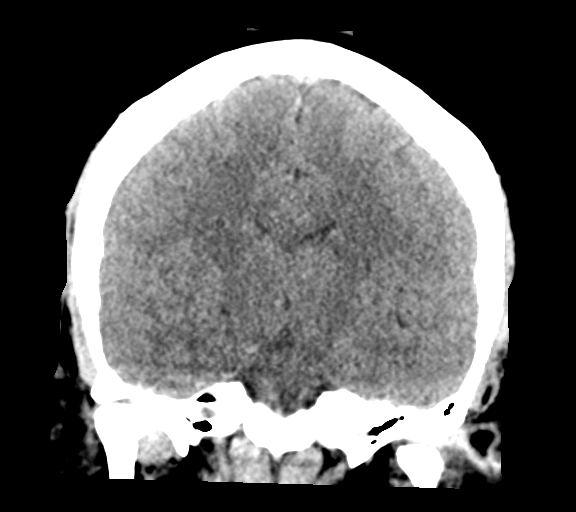

[Series 9: cow 2.0 · axial · 0.42mm/px · z∈[-87,+9]mm · 4 of 81 slices shown]
[im 17/81  brain]
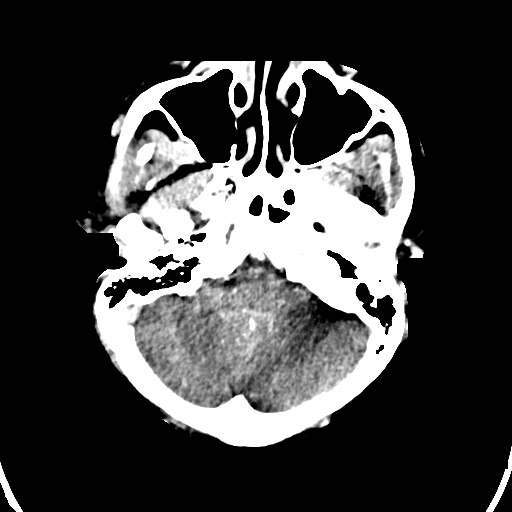
[im 33/81  bone]
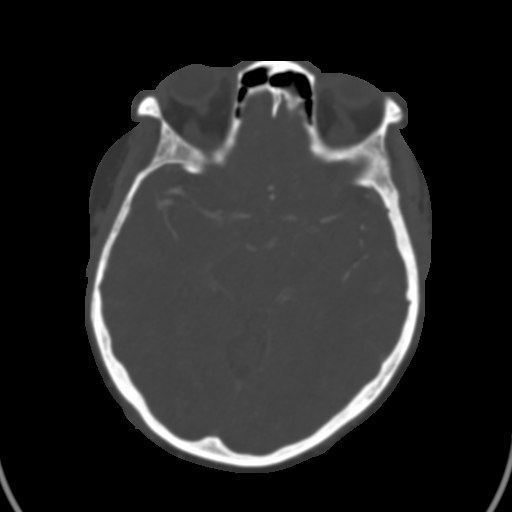
[im 49/81  brain]
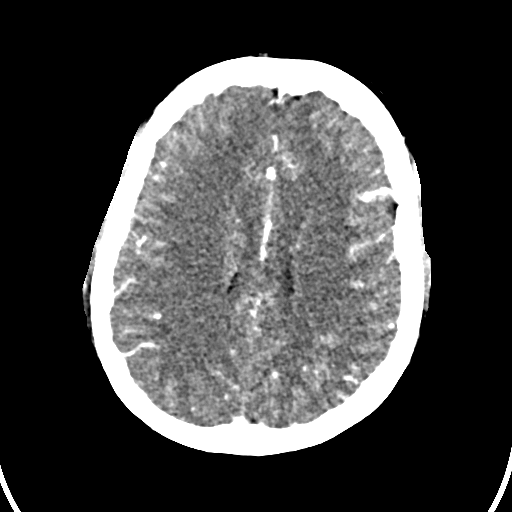
[im 65/81  bone]
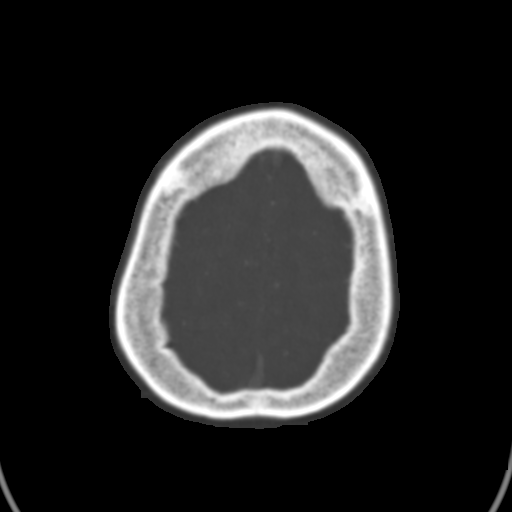

[9 of 47 positions shown; findings below may reference images not displayed]

FINDINGS: CT HEAD

Brain: No evidence of acute infarction, hemorrhage, hydrocephalus,
extra-axial collection or mass lesion/mass effect.

Vascular: No hyperdense vessel.

Skull: Normal. Negative for fracture or focal lesion.

Sinuses: Mucosal thickening of the bilateral ethmoid cells.

Orbits: No acute finding.

CTA HEAD

Anterior circulation: Calcified plaques in the bilateral carotid
siphons without hemodynamically significant stenosis. There is a
focal moderate stenosis at the origin of a right M2/MCA posterior
division branch with poststenotic fusiform dilatation and increased
tortuosity of this branch. Remainder of the right MCA vascular tree
is maintained. The left MCA vascular tree and bilateral ACA vascular
tree are maintained.

Posterior circulation: Occlusion of the right P2/PCA segment with
reconstitution at the distal P2 segment the intracranial bilateral
vertebral arteries, basilar artery and left posterior cerebral
arteries are maintained.

Venous sinuses: Poorly opacified due to contrast bolus timing.

Anatomic variants: None significant.
IMPRESSION: 1. Occlusion of the right P2/PCA segment with reconstitution at the
distal P2 segment, as seen on prior MR angiogram.
2. Focal moderate stenosis at the origin of a right M2/MCA posterior
division branch with poststenotic fusiform dilatation and increased
tortuosity of this branch.
3. No acute intracranial abnormality.

## 2021-01-07 MED ORDER — PERFLUTREN LIPID MICROSPHERE
1.0000 mL | INTRAVENOUS | Status: AC | PRN
  Administered 2021-01-07: 3 mL via INTRAVENOUS
  Filled 2021-01-07: qty 10

## 2021-01-07 MED ORDER — CLOPIDOGREL BISULFATE 75 MG PO TABS: 75.0000 mg | ORAL_TABLET | Freq: Every day | ORAL | 0 refills | Status: DC

## 2021-01-07 MED ORDER — ASPIRIN 325 MG PO TBEC: 325.0000 mg | DELAYED_RELEASE_TABLET | Freq: Every day | ORAL | 0 refills | Status: DC

## 2021-01-07 MED ORDER — ATORVASTATIN CALCIUM 80 MG PO TABS: 80.0000 mg | ORAL_TABLET | Freq: Every day | ORAL | 2 refills | Status: DC

## 2021-01-07 MED ORDER — IOHEXOL 350 MG/ML SOLN
50.0000 mL | Freq: Once | INTRAVENOUS | Status: AC | PRN
  Administered 2021-01-07: 50 mL via INTRAVENOUS

## 2021-01-07 NOTE — Discharge Summary (Cosign Needed Addendum)
Name: [name] MRN: [id number] DOB: 1966/09/29 55 y.o. PCP: Verdell Face, DO  Date of Admission: 01/06/2021 11:52 AM Date of Discharge:  01/07/2021 Attending Physician: Tyson Alias, *  Discharge Diagnosis: 1. Transient ischemic attack 2. 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 Hypertension 4. HIV 5. Obesity  Discharge Medications: Allergies as of 01/07/2021       Reactions   Morphine Other (See Comments)   Burning under the skin   Doxycycline Hyclate Rash        Medication List     TAKE these medications    aspirin 325 MG EC tablet Take 1 tablet (325 mg total) by mouth daily. Start taking on: January 08, 2021 What changed:  medication strength how much to take additional instructions   atorvastatin 80 MG tablet Commonly known as: LIPITOR Take 1 tablet (80 mg total) by mouth daily. Start taking on: January 08, 2021   cloNIDine 0.1 MG tablet Commonly known as: CATAPRES Take 0.1 mg by mouth daily as needed (night sweats).   clopidogrel 75 MG tablet Commonly known as: PLAVIX Take 1 tablet (75 mg total) by mouth daily. Start taking on: January 08, 2021   cyclobenzaprine 5 MG tablet Commonly known as: FLEXERIL TAKE 1 TABLET BY MOUTH THREE TIMES A DAY AS NEEDED FOR MUSCLE SPASMS   gabapentin 300 MG capsule Commonly known as: NEURONTIN TAKE 1 CAPSULE BY MOUTH THREE TIMES A DAY What changed:  how much to take how to take this when to take this reasons to take this additional instructions   hydrochlorothiazide 25 MG tablet Commonly known as: HYDRODIURIL TAKE 1 TABLET BY MOUTH EVERY DAY   metoprolol succinate 50 MG 24 hr tablet Commonly known as: TOPROL-XL TAKE 1 TABLET BY MOUTH EVERYDAY AT BEDTIME What changed: See the new instructions.   nystatin powder Commonly known as: MYCOSTATIN/NYSTOP Apply 1 application topically daily as needed (redness).   omeprazole 20 MG capsule Commonly known as: PRILOSEC TAKE 1 CAPSULE BY MOUTH  TWICE A DAY   Symtuza 800-150-200-10 MG Tabs Generic drug: Darunavir-Cobicisctat-Emtricitabine-Tenofovir Alafenamide Take 1 tablet by mouth daily with breakfast.   valACYclovir 1000 MG tablet Commonly known as: VALTREX Take 1 tablet (1,000 mg total) by mouth daily.        Disposition and follow-up:   Kelly.[name] was discharged from [facility] in [place] condition.  At the hospital follow up visit please address:  1.  Transient Ischemic Attack. MRA head showing segmental occlusion or high-grade stenosis of proximal right P2 PCA. CTA head showing occlusion of right P2/PCA segment with reconstitution at the distal P2 segment and focal moderate stenosis at the origin of a right M2/MCA posterior division branch with poststenotic fusiform dilatation and increased tortuosity of this branch. ECHO showing EF 55-60%, no LA dilation, no ASD/PFO seen. As per SAMPPRIS trial, will discharge with ASA 325mg , plavix 75mg , and lipitor 80mg  for 3 months followed by ASA monotherapy. Also with plan for outpatient follow up in 6 weeks at [facility] with Dr. .  2.  Labs / imaging needed at time of follow-up: BMP  3.  Pending labs/ test needing follow-up: None  Follow-up Appointments:  Follow-up Information     [facility] Follow up in 6 week(s).   Why: See Dr. information: [address]     [address] [place] Marchelle Folks [address] [phone]        [place], DO Follow up in 3 week(s).  Specialty: Internal Medicine Contact information: [address] [place] [address] [phone]                 Hospital Course by problem list: 1. Transient Ischemic Attack. Patient with stroke risk factors of hypertension, former tobacco use disorder (quit 13 years ago) admitted for management of a TIA. She had symptoms consistent with MCA distribution with facial numbness in addition to possible PCA  distribution with dizziness and tunnel vision. This is patient's first cerebrovascular event. She was asymptomatic on my exam and was able to work well with PT/OT without any need for follow up with them. MRA head showing segmental occlusion or high-grade stenosis of proximal right P2 PCA. MRI neck showing no occlusion or hemodynamically significant stenosis in the neck. CTA head showing occlusion of right P2/PCA segment with reconstitution at the distal P2 segment and focal moderate stenosis at the origin of a right M2/MCA posterior division branch with poststenotic fusiform dilatation and increased tortuosity of this branch. ECHO showing EF 55-60%, no LA dilation, no ASD/PFO seen. As per SAMPPRIS trial, will discharge today with ASA 325mg , plavix 75mg , and lipitor 80mg  for 3 months followed by ASA monotherapy. Also with plan for outpatient follow up in 6 weeks at [facility] with Dr. .  Discharge Exam:   BP 122/62 (BP Location: Right Arm)   Pulse 76   Temp 98.5 F (36.9 C) (Oral)   Resp 17   Ht 5\' 4"  (1.626 m)   Wt 100.7 kg   SpO2 99%   BMI 38.11 kg/m  Discharge exam: Physical Exam: General: obese female, lying in bed, NAD. CV: normal rate and regular rate, no m/r/g. Pulm: CTABL, no adventitious sounds noted. MSK: no edema noted bilaterally. Neuro: AAOx4, facial muscles intact bilaterally, sensation intact bilaterally in face and bilateral extremities, muscle strength 5/5 bilaterally throughout.   Pertinent Labs, Studies, and Procedures:  CBC Latest Ref Rng & Units 01/07/2021 01/06/2021 01/08/2019  WBC 4.0 - 10.5 K/uL 11.5(H) 8.9 9.1  Hemoglobin 12.0 - 15.0 g/dL 11.5(L) 13.5 13.6  Hematocrit 36.0 - 46.0 % 35.5(L) 40.9 41.2  Platelets 150 - 400 K/uL 367 406(H) 405(H)   CMP Latest Ref Rng & Units 01/07/2021 01/06/2021 01/08/2019  Glucose 70 - 99 mg/dL 03/10/2019) 03/09/2021) 71  BUN 6 - 20 mg/dL 13 15 14   Creatinine 0.44 - 1.00 mg/dL 03/08/2021 03/10/2019 341(P)  Sodium 135 - 145 mmol/L 137 139 140   Potassium 3.5 - 5.1 mmol/L 3.6 4.4 4.2  Chloride 98 - 111 mmol/L 104 103 104  CO2 22 - 32 mmol/L 22 28 28   Calcium 8.9 - 10.3 mg/dL 8.9 9.0 9.6  Total Protein 6.5 - 8.1 g/dL - 6.9 7.2  Total Bilirubin 0.3 - 1.2 mg/dL - 0.3 0.4  Alkaline Phos 38 - 126 U/L - 85 -  AST 15 - 41 U/L - 29 15  ALT 0 - 44 U/L - 43 21   Drugs of Abuse     Component Value Date/Time   LABOPIA NONE DETECTED 01/06/2021 1242   COCAINSCRNUR NONE DETECTED 01/06/2021 1242   COCAINSCRNUR NEG 11/07/2013 1236   LABBENZ NONE DETECTED 01/06/2021 1242   LABBENZ NEG 11/07/2013 1236   AMPHETMU NONE DETECTED 01/06/2021 1242   THCU POSITIVE (A) 01/06/2021 1242   LABBARB NONE DETECTED 01/06/2021 1242    Urinalysis    Component Value Date/Time   COLORURINE YELLOW 01/06/2021 1242   APPEARANCEUR CLEAR 01/06/2021 1242   LABSPEC 1.020 01/06/2021  1242   PHURINE 6.0 01/06/2021 1242   GLUCOSEU NEGATIVE 01/06/2021 1242   GLUCOSEU NEG mg/dL 81/08/3158 4585   HGBUR SMALL (A) 01/06/2021 1242   BILIRUBINUR NEGATIVE 01/06/2021 1242   KETONESUR NEGATIVE 01/06/2021 1242   PROTEINUR NEGATIVE 01/06/2021 1242   UROBILINOGEN 0.2 08/06/2010 1609   NITRITE NEGATIVE 01/06/2021 1242   LEUKOCYTESUR NEGATIVE 01/06/2021 1242   HbA1c - 6.1  Lipid Panel     Component Value Date/Time   CHOL 246 (H) 01/06/2021 2137   TRIG 164 (H) 01/06/2021 2137   HDL 35 (L) 01/06/2021 2137   CHOLHDL 7.0 01/06/2021 2137   VLDL 33 01/06/2021 2137   LDLCALC 178 (H) 01/06/2021 2137   LDLCALC 211 (H) 01/08/2019 1108     Discharge Instructions: Discharge Instructions     Call MD for:  difficulty breathing, headache or visual disturbances   Complete by: As directed    Call MD for:  extreme fatigue   Complete by: As directed    Call MD for:  hives   Complete by: As directed    Call MD for:  persistant dizziness or light-headedness   Complete by: As directed    Call MD for:  persistant nausea and vomiting   Complete by: As directed    Call MD for:   redness, tenderness, or signs of infection (pain, swelling, redness, odor or green/yellow discharge around incision site)   Complete by: As directed    Call MD for:  severe uncontrolled pain   Complete by: As directed    Call MD for:  temperature >100.4   Complete by: As directed    Diet - low sodium heart healthy   Complete by: As directed    Discharge instructions   Complete by: As directed    [name], it was a pleasure taking care of you during your time here. You were found to have a mini-stroke on the imaging we did here.   Please make sure to do the following:  1. Please take Aspirin 325mg  daily, Clopidogrel (Plavix) 75mg  daily, and Atorvastatin (Lipitor) 80mg  daily. I have sent all of these medications to your preferred pharmacy.  2. Please make an appointment to see your primary care doctor, Dr. , in 3 weeks.  3. Please make sure to see Dr. at [facility] in 6 weeks. It is important to go to this visit so that they can adjust your medications and try to minimize any risks of having a stroke in the future.   Increase activity slowly   Complete by: As directed        Signed: Jaynie Collins, MD 01/07/2021, 4:47 PM   Pager: [phone]

## 2021-01-07 NOTE — Evaluation (Signed)
Physical Therapy Evaluation & Discharge Patient Details Name: [name] MRN: [id number] DOB: [birth year] Today's Date: 01/07/2021   History of Present Illness  55 y/o female presented to ED on 3/8 with new onset of dizziness and L sided numbness which resolved within 20 minutes. Admitted for TIA work up. MRA of head: Segmental occlusion or high-grade stenosis of the proximal right P2 PCA.  PMH: HTN, cocaine use, HIV, MI  Clinical Impression  PTA, patient lives with husband, mother, and grandchild (25 y/o) and reports independence with all mobility. Patient overall modI for mobility with no AD. Reports dizziness with transitioning to EOB and standing, orthostatics negative (see below). No skilled PT needs required during acute stay. No PT follow up recommended at this time.   Orthostatic BPs  Supine 144/68 HR 78  Sitting 141/85 HR 73  Standing 147/85 HR 76       Follow Up Recommendations No PT follow up    Equipment Recommendations  None recommended by PT    Recommendations for Other Services       Precautions / Restrictions Precautions Precautions: Fall Restrictions Weight Bearing Restrictions: No      Mobility  Bed Mobility Overal bed mobility: Modified Independent                  Transfers Overall transfer level: Modified independent Equipment used: None                Ambulation/Gait Ambulation/Gait assistance: Modified independent (Device/Increase time) Gait Distance (Feet): 150 Feet Assistive device: None Gait Pattern/deviations: WFL(Within Functional Limits)     General Gait Details: patient feels dizzy with ambulation and transitions. See note for orthostatics, however negative. No LOB noted  Stairs Stairs: Yes Stairs assistance: Modified independent (Device/Increase time) Stair Management: No rails;Forwards;Step to pattern Number of Stairs: 2 General stair comments: no use of rails to simulate home environment. No difficulty  noted  Wheelchair Mobility    Modified Rankin (Stroke Patients Only) Modified Rankin (Stroke Patients Only) Pre-Morbid Rankin Score: No symptoms Modified Rankin: No symptoms     Balance Overall balance assessment: No apparent balance deficits (not formally assessed)                                           Pertinent Vitals/Pain Pain Assessment: No/denies pain    Home Living Family/patient expects to be discharged to:: Private residence Living Arrangements: Spouse/significant other;Parent;Other relatives (grandson) Available Help at Discharge: Family;Available 24 hours/day Type of Home: House Home Access: Stairs to enter Entrance Stairs-Rails: None Entrance Stairs-Number of Steps: 1 Home Layout: One level Home Equipment: Shower seat;Tub bench;Bedside commode;Toilet riser;Cane - single point      Prior Function Level of Independence: Independent               Hand Dominance        Extremity/Trunk Assessment   Upper Extremity Assessment Upper Extremity Assessment: Defer to OT evaluation    Lower Extremity Assessment Lower Extremity Assessment: Overall WFL for tasks assessed    Cervical / Trunk Assessment Cervical / Trunk Assessment: Normal  Communication   Communication: No difficulties  Cognition Arousal/Alertness: Awake/alert Behavior During Therapy: WFL for tasks assessed/performed Overall Cognitive Status: Within Functional Limits for tasks assessed  General Comments      Exercises     Assessment/Plan    PT Assessment Patent does not need any further PT services  PT Problem List         PT Treatment Interventions      PT Goals (Current goals can be found in the Care Plan section)  Acute Rehab PT Goals Patient Stated Goal: to go home PT Goal Formulation: With patient    Frequency     Barriers to discharge        Co-evaluation               AM-PAC  PT "6 Clicks" Mobility  Outcome Measure Help needed turning from your back to your side while in a flat bed without using bedrails?: None Help needed moving from lying on your back to sitting on the side of a flat bed without using bedrails?: None Help needed moving to and from a bed to a chair (including a wheelchair)?: None Help needed standing up from a chair using your arms (e.g., wheelchair or bedside chair)?: None Help needed to walk in hospital room?: None Help needed climbing 3-5 steps with a railing? : None 6 Click Score: 24    End of Session   Activity Tolerance: Patient tolerated treatment well Patient left: in bed;with call bell/phone within reach Nurse Communication: Mobility status PT Visit Diagnosis: Unsteadiness on feet (R26.81);Muscle weakness (generalized) (M62.81)    Time: 2130-8657 PT Time Calculation (min) (ACUTE ONLY): 41 min   Charges:   PT Evaluation $PT Eval Low Complexity: 1 Low PT Treatments $Therapeutic Activity: 8-22 mins        [name] A. Dan Humphreys PT, DPT Acute Rehabilitation Services Pager [phone] Office [phone]   [name] 01/07/2021, 9:31 AM

## 2021-01-07 NOTE — Plan of Care (Signed)
Independent with ADLS.

## 2021-01-07 NOTE — Progress Notes (Signed)
  Echocardiogram 2D Echocardiogram has been performed with Definity.  Gerda Diss 01/07/2021, 11:42 AM

## 2021-01-07 NOTE — Care Management Obs Status (Signed)
MEDICARE OBSERVATION STATUS NOTIFICATION   Patient Details  Name: [name] MRN: [id number] Date of Birth: [birth year]   Medicare Observation Status Notification Given:  Yes    Baldemar Lenis, LCSW 01/07/2021, 4:11 PM

## 2021-01-07 NOTE — Evaluation (Signed)
Occupational Therapy Evaluation Patient Details Name: [name] MRN: [id number] DOB: 11-28-1965 Today's Date: 01/07/2021    History of Present Illness 55 y/o female presented to ED on 3/8 with new onset of dizziness and L sided numbness which resolved within 20 minutes. Admitted for TIA work up. MRA of head: Segmental occlusion or high-grade stenosis of the proximal right P2 PCA.  PMH: HTN, cocaine use, HIV, MI   Clinical Impression   PTA patient reports independent and driving. Admitted for above and presenting near baseline at modified independent level for ADLs, functional mobility and transfers.  She continues to reports mild dizziness/lightheadedness as well as blurry vision during session, but VSS.  Patient reports blurry vision improved from yesterday, but not 100% yet; improves with reading glasses and encouraged her to wear them. Patient will have 24/7 support at home, is agreeable to not driving (reports her spouse will drive her).  Based on performance today, no further OT needs have been identified and OT will sign off. Thank you for this referral, if further needs arise please re-consult.     Follow Up Recommendations  No OT follow up;Supervision - Intermittent    Equipment Recommendations  None recommended by OT    Recommendations for Other Services       Precautions / Restrictions Precautions Precautions: Fall Restrictions Weight Bearing Restrictions: No      Mobility Bed Mobility Overal bed mobility: Modified Independent                  Transfers Overall transfer level: Modified independent Equipment used: None                  Balance Overall balance assessment: No apparent balance deficits (not formally assessed)                                         ADL either performed or assessed with clinical judgement   ADL Overall ADL's : Modified independent;At baseline                                        General ADL Comments: modified independent for ADLs, transfers     Vision Baseline Vision/History: Wears glasses Wears Glasses: Reading only Patient Visual Report: Other (comment);Blurring of vision (reports "difficulty focus") Vision Assessment?: Yes Eye Alignment: Within Functional Limits Ocular Range of Motion: Within Functional Limits Alignment/Gaze Preference: Within Defined Limits Tracking/Visual Pursuits: Able to track stimulus in all quads without difficulty Visual Fields: No apparent deficits Additional Comments: vision appears WFL, patient reports blurry vision but improves with reading glasses; encouraged to wear reading glasses and notify MD if anything changes     Perception     Praxis      Pertinent Vitals/Pain Pain Assessment: No/denies pain     Hand Dominance Left   Extremity/Trunk Assessment Upper Extremity Assessment Upper Extremity Assessment: Overall WFL for tasks assessed   Lower Extremity Assessment Lower Extremity Assessment: Defer to PT evaluation   Cervical / Trunk Assessment Cervical / Trunk Assessment: Normal   Communication Communication Communication: No difficulties   Cognition Arousal/Alertness: Awake/alert Behavior During Therapy: WFL for tasks assessed/performed Overall Cognitive Status: Within Functional Limits for tasks assessed  General Comments  VSS, reports dizzy and lightheaded during session (orthostatics negative), pt labile during session    Exercises     Shoulder Instructions      Home Living Family/patient expects to be discharged to:: Private residence Living Arrangements: Spouse/significant other;Parent;Other relatives (grandson) Available Help at Discharge: Family;Available 24 hours/day Type of Home: House Home Access: Stairs to enter [employer] of Steps: 1 Entrance Stairs-Rails: None Home Layout: One level     Bathroom Shower/Tub: Multimedia programmer: Handicapped height     Home Equipment: Information systems manager;Tub bench;Bedside commode;Toilet riser;Cane - single point          Prior Functioning/Environment Level of Independence: Independent        Comments: driving, independent        OT Problem List: Impaired vision/perception;Obesity      OT Treatment/Interventions:      OT Goals(Current goals can be found in the care plan section) Acute Rehab OT Goals Patient Stated Goal: to go home OT Goal Formulation: With patient  OT Frequency:     Barriers to D/C:            Co-evaluation              AM-PAC OT "6 Clicks" Daily Activity     Outcome Measure Help from another person eating meals?: None Help from another person taking care of personal grooming?: None Help from another person toileting, which includes using toliet, bedpan, or urinal?: None Help from another person bathing (including washing, rinsing, drying)?: None Help from another person to put on and taking off regular upper body clothing?: None Help from another person to put on and taking off regular lower body clothing?: None 6 Click Score: 24   End of Session Equipment Utilized During Treatment: Gait belt Nurse Communication: Mobility status  Activity Tolerance: Patient tolerated treatment well Patient left: in bed;with call bell/phone within reach  OT Visit Diagnosis: Other abnormalities of gait and mobility (R26.89)                Time: 6834-1962 OT Time Calculation (min): 34 min Charges:  OT General Charges $OT Visit: 1 Visit OT Evaluation $OT Eval Low Complexity: 1 Low OT Treatments $Self Care/Home Management : 8-22 mins  Barry Brunner, OT Acute Rehabilitation Services Pager [phone] Office [phone]   Chancy Milroy 01/07/2021, 3:19 PM

## 2021-01-07 NOTE — Progress Notes (Signed)
STROKE TEAM PROGRESS NOTE   INTERVAL HISTORY No acute events since arrival. Patient reports she had no further headache, visual disturbance, photophobia or numbness overnight. She reports the episode with severe frontal bilateral headache and numbness scared her yesterday. She did have photophobia and dizziness with the episode.  We discussed plan of care including ongoing stroke work up. Questions answered.   Vitals:   01/06/21 2345 01/07/21 0340 01/07/21 0726 01/07/21 1140  BP: (!) 140/96 134/75 (!) 155/87 (!) 144/76  Pulse: 87 77 77 74  Resp: 17 15 15 14   Temp: 98.7 F (37.1 C) 98.6 F (37 C) 99.3 F (37.4 C) 98.5 F (36.9 C)  TempSrc: Oral Oral Oral Oral  SpO2: 100% 98% 100%   Weight:      Height:       CBC:  Recent Labs  Lab 01/06/21 1242 01/07/21 0353  WBC 8.9 11.5*  NEUTROABS 4.4  --   HGB 13.5 11.5*  HCT 40.9 35.5*  MCV 87.8 88.8  PLT 406* 367   Basic Metabolic Panel:  Recent Labs  Lab 01/06/21 1343 01/07/21 0353  NA 139 137  K 4.4 3.6  CL 103 104  CO2 28 22  GLUCOSE 107* 105*  BUN 15 13  CREATININE 0.98 0.90  CALCIUM 9.0 8.9   Lipid Panel:  Recent Labs  Lab 01/06/21 2137  CHOL 246*  TRIG 164*  HDL 35*  CHOLHDL 7.0  VLDL 33  LDLCALC 2138*   HgbA1c:  Recent Labs  Lab 01/06/21 2137  HGBA1C 6.1*   Urine Drug Screen:  Recent Labs  Lab 01/06/21 1242  LABOPIA NONE DETECTED  COCAINSCRNUR NONE DETECTED  LABBENZ NONE DETECTED  AMPHETMU NONE DETECTED  THCU POSITIVE*  LABBARB NONE DETECTED    Alcohol Level  Recent Labs  Lab 01/06/21 1242  ETH <10    IMAGING past 24 hours CT ANGIO HEAD W OR WO CONTRAST  Result Date: 01/07/2021 CLINICAL DATA:  Transient ischemic attack. EXAM: CT ANGIOGRAPHY HEAD TECHNIQUE: Multidetector CT imaging of the head was performed using the standard protocol during bolus administration of intravenous contrast. Multiplanar CT image reconstructions and MIPs were obtained to evaluate the vascular anatomy. CONTRAST:   43mL OMNIPAQUE IOHEXOL 350 MG/ML SOLN COMPARISON:  MRI of the brain January 06, 2021. FINDINGS: CT HEAD Brain: No evidence of acute infarction, hemorrhage, hydrocephalus, extra-axial collection or mass lesion/mass effect. Vascular: No hyperdense vessel. Skull: Normal. Negative for fracture or focal lesion. Sinuses: Mucosal thickening of the bilateral ethmoid cells. Orbits: No acute finding. CTA HEAD Anterior circulation: Calcified plaques in the bilateral carotid siphons without hemodynamically significant stenosis. There is a focal moderate stenosis at the origin of a right M2/MCA posterior division branch with poststenotic fusiform dilatation and increased tortuosity of this branch. Remainder of the right MCA vascular tree is maintained. The left MCA vascular tree and bilateral ACA vascular tree are maintained. Posterior circulation: Occlusion of the right P2/PCA segment with reconstitution at the distal P2 segment the intracranial bilateral vertebral arteries, basilar artery and left posterior cerebral arteries are maintained. Venous sinuses: Poorly opacified due to contrast bolus timing. Anatomic variants: None significant. IMPRESSION: 1. Occlusion of the right P2/PCA segment with reconstitution at the distal P2 segment, as seen on prior MR angiogram. 2. Focal moderate stenosis at the origin of a right M2/MCA posterior division branch with poststenotic fusiform dilatation and increased tortuosity of this branch. 3. No acute intracranial abnormality. Electronically Signed   By: January 08, 2021.D.  On: 01/07/2021 11:50   MR ANGIO HEAD WO CONTRAST  Result Date: 01/06/2021 CLINICAL DATA:  Code stroke follow-up, left-sided numbness EXAM: MRI HEAD WITHOUT CONTRAST MRA HEAD WITHOUT CONTRAST MRA NECK WITHOUT AND WITH CONTRAST TECHNIQUE: Multiplanar, multiecho pulse sequences of the brain and surrounding structures were obtained without intravenous contrast. Angiographic images of the Circle of Willis  were obtained using MRA technique without intravenous contrast. Angiographic images of the neck were obtained using MRA technique without and with intravenous contrast. Carotid stenosis measurements (when applicable) are obtained utilizing NASCET criteria, using the distal internal carotid diameter as the denominator. CONTRAST:  9mL GADAVIST GADOBUTROL 1 MMOL/ML IV SOLN COMPARISON:  None. FINDINGS: MRI HEAD Brain: There is no acute infarction or intracranial hemorrhage. There is no intracranial mass, mass effect, or edema. There is no hydrocephalus or extra-axial fluid collection. Ventricles and sulci are normal in size and configuration. Patchy small foci of T2 hyperintensity in the supratentorial white matter are nonspecific but may reflect mild chronic microvascular ischemic changes. Vascular: Major vessel flow voids at the skull base are preserved. Skull and upper cervical spine: Normal marrow signal is preserved. Sinuses/Orbits: Mild mucosal thickening.  Orbits are unremarkable. Other: Sella is unremarkable.  Mastoid air cells are clear. MRA HEAD Intracranial internal carotid arteries are patent. Middle and anterior cerebral arteries are patent. Intracranial vertebral arteries and basilar artery are patent. Right posterior communicating artery is present. Right P1 PCA is patent. There is a 6 mm segment of occlusion or high-grade stenosis of the proximal right P2 PCA. Left posterior cerebral artery is patent. There is no aneurysm. MRA NECK Common, internal, and external carotid arteries are patent. Codominant extracranial vertebral arteries are patent. No stenosis or evidence of dissection. IMPRESSION: No acute infarction, hemorrhage, or mass. Mild chronic microvascular ischemic changes. Segmental occlusion or high-grade stenosis of the proximal right P2 PCA. No occlusion or hemodynamically significant stenosis in the neck. Electronically Signed   By: Guadlupe Spanish M.D.   On: 01/06/2021 18:20   MR ANGIO NECK  W WO CONTRAST  Result Date: 01/06/2021 CLINICAL DATA:  Code stroke follow-up, left-sided numbness EXAM: MRI HEAD WITHOUT CONTRAST MRA HEAD WITHOUT CONTRAST MRA NECK WITHOUT AND WITH CONTRAST TECHNIQUE: Multiplanar, multiecho pulse sequences of the brain and surrounding structures were obtained without intravenous contrast. Angiographic images of the Circle of Willis were obtained using MRA technique without intravenous contrast. Angiographic images of the neck were obtained using MRA technique without and with intravenous contrast. Carotid stenosis measurements (when applicable) are obtained utilizing NASCET criteria, using the distal internal carotid diameter as the denominator. CONTRAST:  9mL GADAVIST GADOBUTROL 1 MMOL/ML IV SOLN COMPARISON:  None. FINDINGS: MRI HEAD Brain: There is no acute infarction or intracranial hemorrhage. There is no intracranial mass, mass effect, or edema. There is no hydrocephalus or extra-axial fluid collection. Ventricles and sulci are normal in size and configuration. Patchy small foci of T2 hyperintensity in the supratentorial white matter are nonspecific but may reflect mild chronic microvascular ischemic changes. Vascular: Major vessel flow voids at the skull base are preserved. Skull and upper cervical spine: Normal marrow signal is preserved. Sinuses/Orbits: Mild mucosal thickening.  Orbits are unremarkable. Other: Sella is unremarkable.  Mastoid air cells are clear. MRA HEAD Intracranial internal carotid arteries are patent. Middle and anterior cerebral arteries are patent. Intracranial vertebral arteries and basilar artery are patent. Right posterior communicating artery is present. Right P1 PCA is patent. There is a 6 mm segment of occlusion or high-grade stenosis of the  proximal right P2 PCA. Left posterior cerebral artery is patent. There is no aneurysm. MRA NECK Common, internal, and external carotid arteries are patent. Codominant extracranial vertebral arteries are  patent. No stenosis or evidence of dissection. IMPRESSION: No acute infarction, hemorrhage, or mass. Mild chronic microvascular ischemic changes. Segmental occlusion or high-grade stenosis of the proximal right P2 PCA. No occlusion or hemodynamically significant stenosis in the neck. Electronically Signed   By: Guadlupe Spanish M.D.   On: 01/06/2021 18:20   MR BRAIN WO CONTRAST  Result Date: 01/06/2021 CLINICAL DATA:  Code stroke follow-up, left-sided numbness EXAM: MRI HEAD WITHOUT CONTRAST MRA HEAD WITHOUT CONTRAST MRA NECK WITHOUT AND WITH CONTRAST TECHNIQUE: Multiplanar, multiecho pulse sequences of the brain and surrounding structures were obtained without intravenous contrast. Angiographic images of the Circle of Willis were obtained using MRA technique without intravenous contrast. Angiographic images of the neck were obtained using MRA technique without and with intravenous contrast. Carotid stenosis measurements (when applicable) are obtained utilizing NASCET criteria, using the distal internal carotid diameter as the denominator. CONTRAST:  9mL GADAVIST GADOBUTROL 1 MMOL/ML IV SOLN COMPARISON:  None. FINDINGS: MRI HEAD Brain: There is no acute infarction or intracranial hemorrhage. There is no intracranial mass, mass effect, or edema. There is no hydrocephalus or extra-axial fluid collection. Ventricles and sulci are normal in size and configuration. Patchy small foci of T2 hyperintensity in the supratentorial white matter are nonspecific but may reflect mild chronic microvascular ischemic changes. Vascular: Major vessel flow voids at the skull base are preserved. Skull and upper cervical spine: Normal marrow signal is preserved. Sinuses/Orbits: Mild mucosal thickening.  Orbits are unremarkable. Other: Sella is unremarkable.  Mastoid air cells are clear. MRA HEAD Intracranial internal carotid arteries are patent. Middle and anterior cerebral arteries are patent. Intracranial vertebral arteries and basilar  artery are patent. Right posterior communicating artery is present. Right P1 PCA is patent. There is a 6 mm segment of occlusion or high-grade stenosis of the proximal right P2 PCA. Left posterior cerebral artery is patent. There is no aneurysm. MRA NECK Common, internal, and external carotid arteries are patent. Codominant extracranial vertebral arteries are patent. No stenosis or evidence of dissection. IMPRESSION: No acute infarction, hemorrhage, or mass. Mild chronic microvascular ischemic changes. Segmental occlusion or high-grade stenosis of the proximal right P2 PCA. No occlusion or hemodynamically significant stenosis in the neck. Electronically Signed   By: Guadlupe Spanish M.D.   On: 01/06/2021 18:20   ECHOCARDIOGRAM COMPLETE  Result Date: 01/07/2021    ECHOCARDIOGRAM REPORT   Patient Name:   [name] Date of Exam: 01/07/2021 Medical Rec #:  [id number]        Height:       64.0 in Accession #:    [id number]       Weight:       222.0 lb Date of Birth:  08-20-1966       BSA:          2.045 m Patient Age:    54 years         BP:           155/87 mmHg Patient Gender: F                HR:           77 bpm. Exam Location:  Inpatient Procedure: 2D Echo, Cardiac Doppler and Color Doppler Indications:    TIA  History:        Patient has no prior  history of Echocardiogram examinations.                 Risk Factors:Hypertension and Former Smoker. HIV.  Sonographer:    Ross Ludwig RDCS (AE) Referring Phys: [id number] [facility]  Sonographer Comments: Patient is morbidly obese. Image acquisition challenging due to patient body habitus. IMPRESSIONS  1. Left ventricular ejection fraction, by estimation, is 55 to 60%. The left ventricle has normal function. The left ventricle has no regional wall motion abnormalities. Left ventricular diastolic parameters were normal.  2. Right ventricular systolic function is normal. The right ventricular size is normal.  3. The mitral valve is normal in structure. No  evidence of mitral valve regurgitation. No evidence of mitral stenosis.  4. The aortic valve is tricuspid. There is mild calcification of the aortic valve. Aortic valve regurgitation is not visualized. No aortic stenosis is present. Comparison(s): No prior Echocardiogram. FINDINGS  Left Ventricle: Left ventricular ejection fraction, by estimation, is 55 to 60%. The left ventricle has normal function. The left ventricle has no regional wall motion abnormalities. The left ventricular internal cavity size was normal in size. There is  no left ventricular hypertrophy of the basal-septal segment. Left ventricular diastolic parameters were normal. Right Ventricle: The right ventricular size is normal. No increase in right ventricular wall thickness. Right ventricular systolic function is normal. Left Atrium: Left atrial size was normal in size. Right Atrium: Right atrial size was normal in size. Pericardium: There is no evidence of pericardial effusion. Mitral Valve: The mitral valve is normal in structure. No evidence of mitral valve regurgitation. No evidence of mitral valve stenosis. MV peak gradient, 5.4 mmHg. The mean mitral valve gradient is 2.0 mmHg. Tricuspid Valve: The tricuspid valve is normal in structure. Tricuspid valve regurgitation is not demonstrated. Aortic Valve: The aortic valve is tricuspid. There is mild calcification of the aortic valve. Aortic valve regurgitation is not visualized. No aortic stenosis is present. Aortic valve mean gradient measures 6.0 mmHg. Aortic valve peak gradient measures 13.5 mmHg. Aortic valve area, by VTI measures 1.49 cm. Pulmonic Valve: The pulmonic valve was not well visualized. Pulmonic valve regurgitation is not visualized. No evidence of pulmonic stenosis. Aorta: The aortic root and ascending aorta are structurally normal, with no evidence of dilitation. Venous: The inferior vena cava was not well visualized. IAS/Shunts: The atrial septum is grossly normal.  LEFT  VENTRICLE PLAX 2D LVIDd:         3.80 cm  Diastology LVIDs:         2.70 cm  LV e' medial:    7.18 cm/s LV PW:         1.60 cm  LV E/e' medial:  12.8 LV IVS:        1.60 cm  LV e' lateral:   10.10 cm/s LVOT diam:     1.80 cm  LV E/e' lateral: 9.1 LV SV:         55 LV SV Index:   27 LVOT Area:     2.54 cm  RIGHT VENTRICLE RV Basal diam:  3.20 cm RV S prime:     13.10 cm/s TAPSE (M-mode): 2.3 cm LEFT ATRIUM             Index       RIGHT ATRIUM           Index LA diam:        4.00 cm 1.96 cm/m  RA Area:     13.80 cm LA Vol (  A2C):   34.5 ml 16.87 ml/m RA Volume:   31.30 ml  15.30 ml/m LA Vol (A4C):   25.6 ml 12.52 ml/m LA Biplane Vol: 30.0 ml 14.67 ml/m  AORTIC VALVE AV Area (Vmax):    1.49 cm AV Area (Vmean):   1.74 cm AV Area (VTI):     1.49 cm AV Vmax:           184.00 cm/s AV Vmean:          114.000 cm/s AV VTI:            0.368 m AV Peak Grad:      13.5 mmHg AV Mean Grad:      6.0 mmHg LVOT Vmax:         108.00 cm/s LVOT Vmean:        77.900 cm/s LVOT VTI:          0.215 m LVOT/AV VTI ratio: 0.58  AORTA Ao Root diam: 3.10 cm Ao Asc diam:  3.40 cm MITRAL VALVE MV Area (PHT): 3.21 cm    SHUNTS MV Area VTI:   1.83 cm    Systemic VTI:  0.22 m MV Peak grad:  5.4 mmHg    Systemic Diam: 1.80 cm MV Mean grad:  2.0 mmHg MV Vmax:       1.16 m/s MV Vmean:      59.0 cm/s MV Decel Time: 236 msec MV E velocity: 91.70 cm/s MV A velocity: 87.40 cm/s MV E/A ratio:  1.05 Riley LamMahesh Chandrasekhar MD Electronically signed by Riley LamMahesh Chandrasekhar MD Signature Date/Time: 01/07/2021/12:16:48 PM    Final    PHYSICAL EXAM General: Obese middle aged female, well developed Resp: No extra work of breathing Psych: Calm and cooperative  Mental Status:  Alert and oriented x4.  Calm, cooperative and in NAD.  Speech is clear, fluent and appropriate for context. Naming and comprehension are intact. Able to provide history.  Cranial Nerves: II:  Visual fields intact with no extinction to DSS. PERRL.  III,IV, VI: No ptosis. EOMI. No  nystagmus.  V,VII: Temp sensation equal bilaterally. Smile symmetric VIII: Hearing intact to voice IX,X: Palate rises symmetrically XI: Symmetric shoulder shrug XII: Midline tongue extension  Motor: Right :  Full strength bilaterally in upper and lower extremities Sensory: Temp and light touch intact throughout, bilaterally. No extinction to DSS.  Deep Tendon Reflexes:  Hypoactive reflexes x 4 Plantars: Right: downgoing               Left: downgoing Cerebellar:  FNF and HTS intact bilaterally  Gait: Deferred   ASSESSMENT/PLAN [name] is an 55 y.o. female with a PMHx of HIV, migraines, cocaine use, MI and HTN who [facility] Tuesday morning with new onset of dizziness and left sided numbness at 9:30 AM. Her symptoms resolved within 20 minutes. In ER her BP 165/100 and glucosse 101. Her symptoms gradually getting better although still has some HA.She was seen as a Teleneurology consult by Dr. Roda ShuttersXu for possible TIA.   Stroke like episode vs. TIA all to the right thalamus-given segmental occlusion or high-grade stenosis of the proximal right P2 PCA versus complex migraine. Also has history of polysubstance abuse  CT Head  No acute intracranial finding MRI brain    no acute infarction, hemorrhage, or mass. Mild chronic microvascular ischemic changes are noted.  MRA of head:   Segmental occlusion or high-grade stenosis of the proximal right P2 PCA.  MRA of neck:  No occlusion  or hemodynamically significant stenosis in the neck.   CTA head occlusion of the right P2/PCA segment with reconstitution at the distal P2 segment,  Focal moderate stenosis at the origin of a right M2/MCA posterior division branch with poststenotic fusiform dilatation and increased tortuosity of this branch.  Stroke Risk Factors - Cocaine use, prior MI and HTN  2D Echo EF 55-60%, no shunt, no regional wall abnormality or thrombus  LDL 178-goal less than 70.  High-dose statin atorvastatin  80  HgbA1c 6.1  VTE prophylaxis -Lovenox 40mg  daily     Diet   Diet Heart Room service appropriate? Yes; Fluid consistency: Thin   ASA 325mg , Plavix 75mg  daily   None  Therapy recommendations:  Cleared by PT, OT pending  Disposition:  Home   Due to intracranial atherosclerosis-according to SAMPPRIS trial -we will recommend treatment with aspirin 375, Plavix 75 and atorvastatin 80 for 3 months followed by aspirin only-in the meantime she will have follow-up with outpatient neurology.  Hypertension  Home meds:  Hydrodiuril, toprol-xl, catapres. Currently on toprol.  Permissive hypertension (OK if < 220/120) but gradually normalize in 5-7 days . Long-term BP goal normotensive  Hyperlipidemia  Home meds: None   LDL 178, goal < 70  High intensity statin: Lipitor 80mg  on board   Continue statin at discharge  Other Stroke Risk Factors  Advanced Age >/= 60   Former cigarette smoker   ETOH use, alcohol level <10, advised to drink no more than 1-2 drink(s) a day  Substance abuse - UDS:  THC POSITIVE, Cocaine NONE DETECTED. Patient advised to stop using due to stroke risk. Uses marijuana 14 x per week.   Obesity, Body mass index is 38.11 kg/m., BMI >/= 30 associated with increased stroke risk, recommend weight loss, diet and exercise as appropriate   Migraines   Hospital day # 1  Attending Neurohospitalist Addendum Patient seen and examined with APP/Resident. Agree with the history and physical as documented above. Agree with the plan as documented, which I helped formulate. I have independently reviewed the chart, obtained history, review of systems and examined the patient.I have personally reviewed pertinent head/neck/spine imaging (CT/MRI). Plan discussed with the primary team resident physician over the phone. Please feel free to call with any questions. --- -- , MD Stroke Neurology Pager: [phone]

## 2021-01-07 NOTE — Care Management CC44 (Signed)
Condition Code 44 Documentation Completed  Patient Details  Name: Brittnye Josephs MRN: [id number] Date of Birth: [birth year]   Condition Code 44 given:  Yes Patient signature on Condition Code 44 notice:  Yes Documentation of 2 MD's agreement:  Yes Code 44 added to claim:  Yes    Baldemar Lenis, LCSW 01/07/2021, 4:11 PM

## 2021-01-07 NOTE — TOC CAGE-AID Note (Signed)
Transition of Care [facility]) - CAGE-AID Screening   Patient Details  Name: Arlette Schaad MRN: [id number] Date of Birth: 1966-06-21  Transition of Care [facility]) CM/SW Contact:    Chana Bode, Student-Social Work Phone Number: 01/07/2021, 1:50 PM   Clinical Narrative:MSW student spoke with patient about their cocaine use and patient told student that they had not used cocaine in a number of years and had not smoked cigarettes in ten years. Patient said she has a great supportive church family and does not need any resources at this time.     CAGE-AID Screening:    Have You Ever Felt You Ought to Cut Down on Your Drinking or Drug Use?: No Have People Annoyed You By Critizing Your Drinking Or Drug Use?: No Have You Felt Bad Or Guilty About Your Drinking Or Drug Use?: No Have You Ever Had a Drink or Used Drugs First Thing In The Morning to Steady Your Nerves or to Get Rid of a Hangover?: No CAGE-AID Score: 0  Substance Abuse Education Offered: Yes  Substance abuse interventions: Patient Counseling,Educational Materials

## 2021-01-07 NOTE — Consult Note (Signed)
Referring Physician: Dr. Oswaldo Done    Chief Complaint: Transient left sided numbness  HPI: [name] is an 55 y.o. female with a PMHx of HIV, migraines, cocaine use, MI and HTN who presented to the [facility] ED on Tuesday morning with new onset of dizziness and left sided numbness at 9:30 AM. Her symptoms resolved within 20 minutes. She was seen as a Teleneurology consult by Dr. Roda Shutters for possible TIA.   Per Dr. [place] Danes consult note: "African American female with PMH of HIV and HTN presented to ED for dizziness, off balance, left sided numbness. She stated that she was just out of bathroom at 9:30am and washing up, she had acute onset dizziness, very lightheaded, off balance, not walking straight. Then she felt left face numbness like novocain injection, then spread to whole left arm and left leg. Moments later, she also had right sided headache. She felt some heaviness of left side too but not frank weakness. She denies any visual or speech change. She came to ER for evaluation. BP 165/100 and glucosse 101. In ER, her symptoms gradually getting better although still has some HA. She has HIV but on HARRT treatment. She is on BP meds. She is stressful recently as her family member is in the hospital. She stated that she takes ASA 81 at home but has not taken today."  She was transferred to [facility] for TIA work up.   LSN: 9:30am tPA Given: No: NIHSS = 0, symptoms resolving, likely not stroke IR Thrombectomy? No, NIHSS = 0 Modified Rankin Scale: 0-Completely asymptomatic and back to baseline post- stroke  Past Medical History:  Diagnosis Date  . Anemia   . GERD (gastroesophageal reflux disease)   . Headache    hx of migraines   . History of cocaine abuse (HCC)   . HIV disease (HCC)   . HIV infection (HCC)   . Hypertension    not on medication   . 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 Muscle spasm 05/11/2017  . Myocardial infarction (HCC)    mild MI 09/2004     no  heart problems since  . Neck swelling 11/16/2017  . 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 Nonallopathic lesion of head 11/16/2017  . Shingles    2000    Past Surgical History:  Procedure Laterality Date  . ABDOMINAL HYSTERECTOMY  1993   Partial  . CESAREAN SECTION    . LAPAROSCOPIC ASSISTED VENTRAL HERNIA REPAIR N/A 01/02/2015   Procedure: LAPAROSCOPIC VENTRAL WALL HERNIA REPAIR;  Surgeon: Karie Soda, MD;  Location: [facility];  Service: General;  Laterality: N/A;  With MESH  . TONSILLECTOMY  1988    Family History  Problem Relation Age of Onset  . Diabetes type II Brother   . Migraines Neg Hx    Social History:  reports that she has quit smoking. Her smoking use included cigarettes. She has never used smokeless tobacco. She reports current alcohol use. She reports current drug use. Frequency: 14.00 times per week. Drug: Marijuana.  Allergies:  Allergies  Allergen Reactions  . Morphine Other (See Comments)    Burning under the skin  . Doxycycline Hyclate Rash    Medications:  No current facility-administered medications on file prior to encounter.   Current Outpatient Medications on File Prior to Encounter  Medication Sig Dispense Refill  . aspirin EC 81 MG tablet Take 81 mg by mouth daily. Swallow whole.    . Darunavir-Cobicisctat-Emtricitabine-Tenofovir Alafenamide (SYMTUZA) 800-150-200-10 MG  TABS Take 1 tablet by mouth daily with breakfast. 30 tablet 11  . hydrochlorothiazide (HYDRODIURIL) 25 MG tablet TAKE 1 TABLET BY MOUTH EVERY DAY 90 tablet 0  . metoprolol succinate (TOPROL-XL) 50 MG 24 hr tablet TAKE 1 TABLET BY MOUTH EVERYDAY AT BEDTIME (Patient taking differently: Take 50 mg by mouth daily.) 90 tablet 1  . omeprazole (PRILOSEC) 20 MG capsule TAKE 1 CAPSULE BY MOUTH TWICE A DAY 180 capsule 0  . valACYclovir (VALTREX) 1000 MG tablet Take 1 tablet (1,000 mg total) by mouth daily. 90 tablet 11  . cloNIDine (CATAPRES) 0.1 MG tablet Take 0.1 mg by mouth daily as needed  (night sweats).    . cyclobenzaprine (FLEXERIL) 5 MG tablet TAKE 1 TABLET BY MOUTH THREE TIMES A DAY AS NEEDED FOR MUSCLE SPASMS 60 tablet 4  . gabapentin (NEURONTIN) 300 MG capsule TAKE 1 CAPSULE BY MOUTH THREE TIMES A DAY (Patient taking differently: Take 300 mg by mouth 3 (three) times daily as needed (nerve pain).) 270 capsule 1  . nystatin (MYCOSTATIN/NYSTOP) powder Apply 1 application topically daily as needed (redness).    . [DISCONTINUED] pantoprazole sodium (PROTONIX) 40 mg/20 mL PACK Place 20 mLs (40 mg total) into feeding tube daily at 12 noon. 30 each 11    Scheduled: . aspirin EC  325 mg Oral Daily  . atorvastatin  80 mg Oral Daily  . clopidogrel  75 mg Oral Daily  . Darunavir-Cobicisctat-Emtricitabine-Tenofovir Alafenamide  1 tablet Oral Q breakfast  . enoxaparin (LOVENOX) injection  40 mg Subcutaneous Q24H  . hydrochlorothiazide  25 mg Oral Daily  . metoprolol succinate  50 mg Oral QHS  . pantoprazole  40 mg Oral Daily  . valACYclovir  1,000 mg Oral Daily   Continuous: . sodium chloride 100 mL/hr (01/06/21 2018)    ROS: Feels light-headed/presyncopal when sitting up in bed. Other ROS as per HPI. Does not endorse any additional neurological complaints.   Physical Examination: Blood pressure (!) 140/96, pulse 87, temperature 98.7 F (37.1 C), temperature source Oral, resp. rate 17, height  (1.626 m), weight 100.7 kg, SpO2 100 %.  HEENT: [place]/AT Lungs: Respirations unlabored Ext: No edema  Neurologic Examination: Mental Status:  Alert, fully oriented, thought content appropriate.  Speech fluent without evidence of aphasia.  Able to follow all commands without difficulty. Cranial Nerves: II:  Visual fields intact with no extinction to DSS. PERRL.  III,IV, VI: No ptosis. EOMI. No nystagmus.  V,VII: Temp sensation equal bilaterally. Smile symmetric VIII: Hearing intact to voice IX,X: Palate rises symmetrically XI: Symmetric shoulder shrug XII: Midline tongue  extension  Motor: Right : Upper extremity   5/5    Left:     Upper extremity   5/5  Lower extremity   5/5     Lower extremity   5/5 No pronator drift Sensory: Temp and light touch intact throughout, bilaterally. No extinction to DSS.  Deep Tendon Reflexes:  Hypoactive reflexes x 4 Plantars: Right: downgoing  Left: downgoing Cerebellar: No ataxia with FNF bilaterally  Gait: Deferred   Results for orders placed or performed during the hospital encounter of 01/06/21 (from the past 48 hour(s))  CBG monitoring, ED     Status: Abnormal   Collection Time: 01/06/21 11:58 AM  Result Value Ref Range   Glucose-Capillary 101 (H) 70 - 99 mg/dL    Comment: Glucose reference range applies only to samples taken after fasting for at least 8 hours.  Ethanol     Status: None  Collection Time: 01/06/21 12:42 PM  Result Value Ref Range   Alcohol, Ethyl (B) <10 <10 mg/dL    Comment: (NOTE) Lowest detectable limit for serum alcohol is 10 mg/dL.  For medical purposes only. Performed at [facility], [address] [place], [place] [address]   Protime-INR     Status: None   Collection Time: 01/06/21 12:42 PM  Result Value Ref Range   Prothrombin Time 11.7 11.4 - 15.2 seconds   INR 0.9 0.8 - 1.2    Comment: (NOTE) INR goal varies based on device and disease states. Performed at [facility], [address] [place], [place] [address]   APTT     Status: None   Collection Time: 01/06/21 12:42 PM  Result Value Ref Range   aPTT 25 24 - 36 seconds    Comment: Performed at [facility], [address] [place], [place] [address]  CBC     Status: Abnormal   Collection Time: 01/06/21 12:42 PM  Result Value Ref Range   WBC 8.9 4.0 - 10.5 K/uL   RBC 4.66 3.87 - 5.11 MIL/uL   Hemoglobin 13.5 12.0 - 15.0 g/dL   HCT 57.3 22.0 - 25.4 %   MCV 87.8 80.0 - 100.0 fL   MCH 29.0 26.0 - 34.0 pg   MCHC 33.0 30.0 - 36.0 g/dL   RDW 27.0 62.3 - 76.2 %   Platelets 406 (H) 150 -  400 K/uL   nRBC 0.0 0.0 - 0.2 %    Comment: Performed at [facility], [address] [place], [place] [address]  Differential     Status: None   Collection Time: 01/06/21 12:42 PM  Result Value Ref Range   Neutrophils Relative % 49 %   Neutro Abs 4.4 1.7 - 7.7 K/uL   Lymphocytes Relative 38 %   Lymphs Abs 3.3 0.7 - 4.0 K/uL   Monocytes Relative 6 %   Monocytes Absolute 0.5 0.1 - 1.0 K/uL   Eosinophils Relative 5 %   Eosinophils Absolute 0.5 0.0 - 0.5 K/uL   Basophils Relative 1 %   Basophils Absolute 0.1 0.0 - 0.1 K/uL   Immature Granulocytes 1 %   Abs Immature Granulocytes 0.04 0.00 - 0.07 K/uL    Comment: Performed at [facility], [address] [place], [place] [address]  Urine rapid drug screen (hosp performed)     Status: Abnormal   Collection Time: 01/06/21 12:42 PM  Result Value Ref Range   Opiates NONE DETECTED NONE DETECTED   Cocaine NONE DETECTED NONE DETECTED   Benzodiazepines NONE DETECTED NONE DETECTED   Amphetamines NONE DETECTED NONE DETECTED   Tetrahydrocannabinol POSITIVE (A) NONE DETECTED   Barbiturates NONE DETECTED NONE DETECTED    Comment: (NOTE) DRUG SCREEN FOR MEDICAL PURPOSES ONLY.  IF CONFIRMATION IS NEEDED FOR ANY PURPOSE, NOTIFY LAB WITHIN 5 DAYS.  LOWEST DETECTABLE LIMITS FOR URINE DRUG SCREEN Drug Class                     Cutoff (ng/mL) Amphetamine and metabolites    1000 Barbiturate and metabolites    200 Benzodiazepine                 200 Tricyclics and metabolites     300 Opiates and metabolites        300 Cocaine and metabolites        300 THC  50 Performed at [facility], [address] [place], [place] [address]   Urinalysis, Routine w reflex microscopic Urine, Clean Catch     Status: Abnormal   Collection Time: 01/06/21 12:42 PM  Result Value Ref Range   Color, Urine YELLOW YELLOW   APPearance CLEAR CLEAR   Specific Gravity, Urine 1.020 1.005 - 1.030   pH 6.0 5.0  - 8.0   Glucose, UA NEGATIVE NEGATIVE mg/dL   Hgb urine dipstick SMALL (A) NEGATIVE   Bilirubin Urine NEGATIVE NEGATIVE   Ketones, ur NEGATIVE NEGATIVE mg/dL   Protein, ur NEGATIVE NEGATIVE mg/dL   Nitrite NEGATIVE NEGATIVE   Leukocytes,Ua NEGATIVE NEGATIVE    Comment: Performed at [facility], [address] [place], [place] [address]  Pregnancy, urine     Status: None   Collection Time: 01/06/21 12:42 PM  Result Value Ref Range   Preg Test, Ur NEGATIVE NEGATIVE    Comment:        THE SENSITIVITY OF THIS METHODOLOGY IS >20 mIU/mL. Performed at [facility], [address] [place], [place] [address]   SARS Coronavirus 2 by RT PCR (hospital order, performed in [facility] hospital lab)     Status: None   Collection Time: 01/06/21 12:42 PM  Result Value Ref Range   SARS Coronavirus 2 NEGATIVE NEGATIVE    Comment: (NOTE) SARS-CoV-2 target nucleic acids are NOT DETECTED.  The SARS-CoV-2 RNA is generally detectable in upper and lower respiratory specimens during the acute phase of infection. The lowest concentration of SARS-CoV-2 viral copies this assay can detect is 250 copies / mL. A negative result does not preclude SARS-CoV-2 infection and should not be used as the sole basis for treatment or other patient management decisions.  A negative result may occur with improper specimen collection / handling, submission of specimen other than nasopharyngeal swab, presence of viral mutation(s) within the areas targeted by this assay, and inadequate number of viral copies (<250 copies / mL). A negative result must be combined with clinical observations, patient history, and epidemiological information.  Fact Sheet for Patients:   BoilerBrush.com.cy  Fact Sheet for Healthcare Providers: https://pope.com/  This test is not yet approved or  cleared by the [place] FDA and has been authorized for detection  and/or diagnosis of SARS-CoV-2 by FDA under an Emergency Use Authorization (EUA).  This EUA will remain in effect (meaning this test can be used) for the duration of the COVID-19 declaration under Section 564(b)(1) of the Act, 21 U.S.C. section 360bbb-3(b)(1), unless the authorization is terminated or revoked sooner.  Performed at [facility], [address] [place], [place] [address]   Urinalysis, Microscopic (reflex)     Status: Abnormal   Collection Time: 01/06/21 12:42 PM  Result Value Ref Range   RBC / HPF 0-5 0 - 5 RBC/hpf   WBC, UA 0-5 0 - 5 WBC/hpf   Bacteria, UA RARE (A) NONE SEEN   Squamous Epithelial / LPF 0-5 0 - 5   Mucus PRESENT     Comment: Performed at [facility], [address] [place], [place] [address]  Comprehensive metabolic panel     Status: Abnormal   Collection Time: 01/06/21  1:43 PM  Result Value Ref Range   Sodium 139 135 - 145 mmol/L   Potassium 4.4 3.5 - 5.1 mmol/L   Chloride 103 98 - 111 mmol/L   CO2 28 22 - 32  mmol/L   Glucose, Bld 107 (H) 70 - 99 mg/dL    Comment: Glucose reference range applies only to samples taken after fasting for at least 8 hours.   BUN 15 6 - 20 mg/dL   Creatinine, Ser 3.38 0.44 - 1.00 mg/dL   Calcium 9.0 8.9 - 25.0 mg/dL   Total Protein 6.9 6.5 - 8.1 g/dL   Albumin 3.9 3.5 - 5.0 g/dL   AST 29 15 - 41 U/L   ALT 43 0 - 44 U/L   Alkaline Phosphatase 85 38 - 126 U/L   Total Bilirubin 0.3 0.3 - 1.2 mg/dL   GFR, Estimated >53 >97 mL/min    Comment: (NOTE) Calculated using the CKD-EPI Creatinine Equation (2021)    Anion gap 8 5 - 15    Comment: Performed at [facility], [address] [place], [place] [address]  Hemoglobin A1c     Status: Abnormal   Collection Time: 01/06/21  9:37 PM  Result Value Ref Range   Hgb A1c MFr Bld 6.1 (H) 4.8 - 5.6 %    Comment: (NOTE) Pre diabetes:          5.7%-6.4%  Diabetes:              >6.4%  Glycemic control for   <7.0% adults with diabetes     Mean Plasma Glucose 128.37 mg/dL    Comment: Performed at [facility], [address] [place], [place] [address]  Lipid panel     Status: Abnormal   Collection Time: 01/06/21  9:37 PM  Result Value Ref Range   Cholesterol 246 (H) 0 - 200 mg/dL   Triglycerides 240 (H) <150 mg/dL   HDL 35 (L) >97 mg/dL   Total CHOL/HDL Ratio 7.0 RATIO   VLDL 33 0 - 40 mg/dL   LDL Cholesterol 353 (H) 0 - 99 mg/dL    Comment:        Total Cholesterol/HDL:CHD Risk Coronary Heart Disease Risk Table                     Men   Women  1/2 Average Risk   3.4   3.3  Average Risk       5.0   4.4  2 X Average Risk   9.6   7.1  3 X Average Risk  23.4   11.0        Use the calculated Patient Ratio above and the CHD Risk Table to determine the patient's CHD Risk.        ATP III CLASSIFICATION (LDL):  <100     mg/dL   Optimal  299-242  mg/dL   Near or Above                    Optimal  130-159  mg/dL   Borderline  683-419  mg/dL   High  >622     mg/dL   Very High Performed at [facility], [address], [place], [place] [address]    MR ANGIO HEAD WO CONTRAST  Result Date: 01/06/2021 CLINICAL DATA:  Code stroke follow-up, left-sided numbness EXAM: MRI HEAD WITHOUT CONTRAST MRA HEAD WITHOUT CONTRAST MRA NECK WITHOUT AND WITH CONTRAST TECHNIQUE: Multiplanar, multiecho pulse sequences of the brain and surrounding structures were obtained without intravenous contrast. Angiographic images of the Circle of Willis were obtained using MRA technique without intravenous contrast. Angiographic images of the neck were obtained using MRA technique  without and with intravenous contrast. Carotid stenosis measurements (when applicable) are obtained utilizing NASCET criteria, using the distal internal carotid diameter as the denominator. CONTRAST:  9mL GADAVIST GADOBUTROL 1 MMOL/ML IV SOLN COMPARISON:  None. FINDINGS: MRI HEAD Brain: There is no acute infarction or intracranial hemorrhage. There is no intracranial  mass, mass effect, or edema. There is no hydrocephalus or extra-axial fluid collection. Ventricles and sulci are normal in size and configuration. Patchy small foci of T2 hyperintensity in the supratentorial white matter are nonspecific but may reflect mild chronic microvascular ischemic changes. Vascular: Major vessel flow voids at the skull base are preserved. Skull and upper cervical spine: Normal marrow signal is preserved. Sinuses/Orbits: Mild mucosal thickening.  Orbits are unremarkable. Other: Sella is unremarkable.  Mastoid air cells are clear. MRA HEAD Intracranial internal carotid arteries are patent. Middle and anterior cerebral arteries are patent. Intracranial vertebral arteries and basilar artery are patent. Right posterior communicating artery is present. Right P1 PCA is patent. There is a 6 mm segment of occlusion or high-grade stenosis of the proximal right P2 PCA. Left posterior cerebral artery is patent. There is no aneurysm. MRA NECK Common, internal, and external carotid arteries are patent. Codominant extracranial vertebral arteries are patent. No stenosis or evidence of dissection. IMPRESSION: No acute infarction, hemorrhage, or mass. Mild chronic microvascular ischemic changes. Segmental occlusion or high-grade stenosis of the proximal right P2 PCA. No occlusion or hemodynamically significant stenosis in the neck. Electronically Signed   By: Guadlupe SpanishPraneil  Patel M.D.   On: 01/06/2021 18:20   MR ANGIO NECK W WO CONTRAST  Result Date: 01/06/2021 CLINICAL DATA:  Code stroke follow-up, left-sided numbness EXAM: MRI HEAD WITHOUT CONTRAST MRA HEAD WITHOUT CONTRAST MRA NECK WITHOUT AND WITH CONTRAST TECHNIQUE: Multiplanar, multiecho pulse sequences of the brain and surrounding structures were obtained without intravenous contrast. Angiographic images of the Circle of Willis were obtained using MRA technique without intravenous contrast. Angiographic images of the neck were obtained using MRA technique  without and with intravenous contrast. Carotid stenosis measurements (when applicable) are obtained utilizing NASCET criteria, using the distal internal carotid diameter as the denominator. CONTRAST:  9mL GADAVIST GADOBUTROL 1 MMOL/ML IV SOLN COMPARISON:  None. FINDINGS: MRI HEAD Brain: There is no acute infarction or intracranial hemorrhage. There is no intracranial mass, mass effect, or edema. There is no hydrocephalus or extra-axial fluid collection. Ventricles and sulci are normal in size and configuration. Patchy small foci of T2 hyperintensity in the supratentorial white matter are nonspecific but may reflect mild chronic microvascular ischemic changes. Vascular: Major vessel flow voids at the skull base are preserved. Skull and upper cervical spine: Normal marrow signal is preserved. Sinuses/Orbits: Mild mucosal thickening.  Orbits are unremarkable. Other: Sella is unremarkable.  Mastoid air cells are clear. MRA HEAD Intracranial internal carotid arteries are patent. Middle and anterior cerebral arteries are patent. Intracranial vertebral arteries and basilar artery are patent. Right posterior communicating artery is present. Right P1 PCA is patent. There is a 6 mm segment of occlusion or high-grade stenosis of the proximal right P2 PCA. Left posterior cerebral artery is patent. There is no aneurysm. MRA NECK Common, internal, and external carotid arteries are patent. Codominant extracranial vertebral arteries are patent. No stenosis or evidence of dissection. IMPRESSION: No acute infarction, hemorrhage, or mass. Mild chronic microvascular ischemic changes. Segmental occlusion or high-grade stenosis of the proximal right P2 PCA. No occlusion or hemodynamically significant stenosis in the neck. Electronically Signed   By: Jackquline BerlinPraneil  Patel M.D.  On: 01/06/2021 18:20   MR BRAIN WO CONTRAST  Result Date: 01/06/2021 CLINICAL DATA:  Code stroke follow-up, left-sided numbness EXAM: MRI HEAD WITHOUT CONTRAST MRA  HEAD WITHOUT CONTRAST MRA NECK WITHOUT AND WITH CONTRAST TECHNIQUE: Multiplanar, multiecho pulse sequences of the brain and surrounding structures were obtained without intravenous contrast. Angiographic images of the Circle of Willis were obtained using MRA technique without intravenous contrast. Angiographic images of the neck were obtained using MRA technique without and with intravenous contrast. Carotid stenosis measurements (when applicable) are obtained utilizing NASCET criteria, using the distal internal carotid diameter as the denominator. CONTRAST:  36mL GADAVIST GADOBUTROL 1 MMOL/ML IV SOLN COMPARISON:  None. FINDINGS: MRI HEAD Brain: There is no acute infarction or intracranial hemorrhage. There is no intracranial mass, mass effect, or edema. There is no hydrocephalus or extra-axial fluid collection. Ventricles and sulci are normal in size and configuration. Patchy small foci of T2 hyperintensity in the supratentorial white matter are nonspecific but may reflect mild chronic microvascular ischemic changes. Vascular: Major vessel flow voids at the skull base are preserved. Skull and upper cervical spine: Normal marrow signal is preserved. Sinuses/Orbits: Mild mucosal thickening.  Orbits are unremarkable. Other: Sella is unremarkable.  Mastoid air cells are clear. MRA HEAD Intracranial internal carotid arteries are patent. Middle and anterior cerebral arteries are patent. Intracranial vertebral arteries and basilar artery are patent. Right posterior communicating artery is present. Right P1 PCA is patent. There is a 6 mm segment of occlusion or high-grade stenosis of the proximal right P2 PCA. Left posterior cerebral artery is patent. There is no aneurysm. MRA NECK Common, internal, and external carotid arteries are patent. Codominant extracranial vertebral arteries are patent. No stenosis or evidence of dissection. IMPRESSION: No acute infarction, hemorrhage, or mass. Mild chronic microvascular ischemic  changes. Segmental occlusion or high-grade stenosis of the proximal right P2 PCA. No occlusion or hemodynamically significant stenosis in the neck. Electronically Signed   By: Guadlupe Spanish M.D.   On: 01/06/2021 18:20   CT HEAD CODE STROKE WO CONTRAST  Result Date: 01/06/2021 CLINICAL DATA:  Code stroke.  Dizziness.  Left-sided numbness. EXAM: CT HEAD WITHOUT CONTRAST TECHNIQUE: Contiguous axial images were obtained from the base of the skull through the vertex without intravenous contrast. COMPARISON:  CT head December 23, 2011. FINDINGS: Brain: No evidence of acute large vascular territory infarction, acute hemorrhage, hydrocephalus, extra-axial collection or mass lesion/mass effect. Vascular: No evidence of hyperdense vessel. Skull: No acute fracture. Sinuses/Orbits: Mucosal thickening of scattered ethmoid air cells. Other: No mastoid effusions. ASPECTS [facility] Stroke Program Early CT Score) total score (0-10 with 10 being normal): 10 IMPRESSION: 1. No evidence of acute intracranial abnormality.  ASPECTS is 10. 2. Ethmoid air cell mucosal thickening. Code stroke imaging results were communicated on 01/06/2021 at 12:33 pm to provider Dr. Lynelle Doctor Via telephone, who verbally acknowledged these results. Electronically Signed   By: Feliberto Harts MD   On: 01/06/2021 12:37    Assessment: 55 y.o. female presenting with transient episode of dizziness and left sided numbness 1. Neurological exam is nonfocal 2. MRI brain reveals no acute infarction, hemorrhage, or mass. Mild chronic microvascular ischemic changes are noted.  3. MRA of head: Segmental occlusion or high-grade stenosis of the proximal right P2 PCA.  4. MRA of neck: No occlusion or hemodynamically significant stenosis in the neck.  5. Stroke Risk Factors - Cocaine use, prior MI and HTN 6. Given right sided headache with left sided symptoms that progressed in a marching-like fashion,  complicated migraine is also on the DDx.   Recommendations: 1.  HgbA1c, fasting lipid panel 2. CTA of head to further assess the right P2 high grade stenosis seen on MRA head. Of note, MRA can over- or underestimate stenoses and there may be a component of artifact regarding the MRA finding based on personal review of the images.  3. PT consult, OT consult, Speech consult 4. Echocardiogram 5. Cardiac telemetry 6. Prophylactic therapy- Plavix has been added to her home ASA.   7. Risk factor modification 8. Agree with starting atorvastatin 9. Permissive HTN 10. Frequent neuro checks   signed: Dr. Caryl Pina 01/07/2021, 12:29 AM

## 2021-01-07 NOTE — Progress Notes (Signed)
   Subjective:   No acute overnight events.  Feels good today. Never had anything like this happen to her before. Does report having a long smoking history, but stopped 13 years ago. Uses metoprolol 50mg  daily for her blood pressure. Discussed plan for today.  Objective:  Vital signs in last 24 hours: Vitals:   01/06/21 2119 01/06/21 2345 01/07/21 0340 01/07/21 0726  BP: (!) 143/71 (!) 140/96 134/75 (!) 155/87  Pulse: 89 87 77 77  Resp: 15 17 15 15   Temp: 98.9 F (37.2 C) 98.7 F (37.1 C) 98.6 F (37 C) 99.3 F (37.4 C)  TempSrc: Oral Oral Oral Oral  SpO2: 100% 100% 98% 100%  Weight:      Height:       Physical Exam: General: obese female, lying in bed, NAD. CV: normal rate and regular rate, no m/r/g. Pulm: CTABL, no adventitious sounds noted. MSK: no edema noted bilaterally. Neuro: AAOx4, facial muscles intact bilaterally, sensation intact bilaterally in face and bilateral extremities, muscle strength 5/5 bilaterally throughout.  Assessment/Plan:  Principal Problem:   Transient ischemic attack Active Problems:   HIV disease (HCC)   Hypertension  [name] is a 55 year old female with history of HTN, piror tobacco use disorder (quit 13 years ago), and HIV on symtuza presenting to the ED with a transient ischemic attack with full resolution of symptoms.  Transient Ischemic Attack Her clinical condition is significantly improved today and she is back at her normal baseline. Neurological exam today is normal. 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 MRI neck showing no occlusion or hemodynamically significant stenosis in the neck. Biggest risk factors are HTN and former tobacco use. This is her first cerebrovascular event. Will obtain additional imaging with CTA head to further evaluate PCA stenosis/occlusion and ECHO to rule out cardiac abnormality as etiology. Continue on ASA 325mg  daily, plavix 75mg  daily, lipitor 80mg   daily, and toprol-XL 50mg  qhs. Will likely need to continue DAPT for 3 weeks and then monotherapy with either plavix or ASA thereafter. Will need to continue lipitor and toprol-XL as outpatient as well for HTN and HLD control. Anticipated discharge later today or tomorrow.  -greatly appreciate neurology assistance -f/u CTA head -f/u ECHO -PT/OT - no recommended follow up needed  Prior to Admission Living Arrangement: Home Anticipated Discharge Location: Home Barriers to Discharge: continued medical management Dispo: Anticipated discharge in approximately 0-1 day(s).   [name], MD 01/07/2021, 10:30 AM Pager: [phone] After 5pm on weekdays and 1pm on weekends: On Call pager [phone]

## 2021-03-02 ENCOUNTER — Other Ambulatory Visit: Payer: Self-pay | Admitting: Infectious Disease

## 2021-03-02 DIAGNOSIS — K219 Gastro-esophageal reflux disease without esophagitis: Secondary | ICD-10-CM

## 2021-04-04 ENCOUNTER — Other Ambulatory Visit: Payer: Self-pay | Admitting: Student

## 2021-05-02 ENCOUNTER — Other Ambulatory Visit: Payer: Self-pay | Admitting: Student

## 2021-05-06 ENCOUNTER — Other Ambulatory Visit: Payer: Self-pay | Admitting: Infectious Disease

## 2021-05-06 ENCOUNTER — Other Ambulatory Visit: Payer: Self-pay

## 2021-05-06 ENCOUNTER — Other Ambulatory Visit: Payer: Self-pay | Admitting: Student

## 2021-05-06 DIAGNOSIS — R252 Cramp and spasm: Secondary | ICD-10-CM

## 2021-05-13 ENCOUNTER — Other Ambulatory Visit: Payer: Self-pay

## 2021-05-13 ENCOUNTER — Other Ambulatory Visit (HOSPITAL_COMMUNITY)
Admission: RE | Admit: 2021-05-13 | Discharge: 2021-05-13 | Disposition: A | Discharge status: Home / Self Care | Payer: Medicare Other | Source: Ambulatory Visit | Attending: Infectious Disease | Admitting: Infectious Disease

## 2021-05-13 ENCOUNTER — Encounter: Payer: Self-pay | Admitting: Infectious Disease

## 2021-05-13 ENCOUNTER — Ambulatory Visit (INDEPENDENT_AMBULATORY_CARE_PROVIDER_SITE_OTHER): Payer: Medicare Other | Admitting: Infectious Disease

## 2021-05-13 ENCOUNTER — Telehealth: Payer: Self-pay

## 2021-05-13 VITALS — BP 155/93 | HR 75 | Temp 97.8°F | Wt 234.0 lb

## 2021-05-13 DIAGNOSIS — B20 Human immunodeficiency virus [HIV] disease: Secondary | ICD-10-CM | POA: Diagnosis not present

## 2021-05-13 DIAGNOSIS — I10 Essential (primary) hypertension: Secondary | ICD-10-CM

## 2021-05-13 DIAGNOSIS — F4321 Adjustment disorder with depressed mood: Secondary | ICD-10-CM

## 2021-05-13 DIAGNOSIS — B009 Herpesviral infection, unspecified: Secondary | ICD-10-CM | POA: Diagnosis not present

## 2021-05-13 DIAGNOSIS — F418 Other specified anxiety disorders: Secondary | ICD-10-CM | POA: Insufficient documentation

## 2021-05-13 DIAGNOSIS — G63 Polyneuropathy in diseases classified elsewhere: Secondary | ICD-10-CM

## 2021-05-13 DIAGNOSIS — G459 Transient cerebral ischemic attack, unspecified: Secondary | ICD-10-CM

## 2021-05-13 HISTORY — DX: Other specified anxiety disorders: F41.8

## 2021-05-13 HISTORY — DX: Adjustment disorder with depressed mood: F43.21

## 2021-05-13 MED ORDER — SYMTUZA 800-150-200-10 MG PO TABS
1.0000 | ORAL_TABLET | Freq: Every day | ORAL | 11 refills | Status: DC
Start: 2021-05-13 — End: 2022-02-22

## 2021-05-13 MED ORDER — ROSUVASTATIN CALCIUM 20 MG PO TABS: 20.0000 mg | ORAL_TABLET | Freq: Every day | ORAL | 11 refills | Status: DC

## 2021-05-13 NOTE — Telephone Encounter (Signed)
Patient states she is in primary care at [facility] with Dr. Jaynie Collins. RN spoke with front desk there and requested that last office note from 03/02/21 be faxed to Dr. Daiva Eves.   Sandie Ano, RN

## 2021-05-13 NOTE — Progress Notes (Signed)
Subjective:  Chief complaint some depressive symptoms related to the fact that her son's murder is being released from jail in the next month or 2.   Patient ID: [name], female    DOB: 02-06-1966, 55 y.o.   MRN: [id number]  HPI  55 year old African American lady with HIV and AIDS who this  once again been able to perfectly suppress her viral load to <20 on PREZCOBIX and Truvada after having failed STRIBILD with with resistanceto integrase including raltegravir and EVG but not to DTG, N155H,S230N,D232E in 2016.    N155H INSTI Accessory Mutations: None IN Other Mutations: S230N , D232E Integrase Strand Transfer Inhibitors  bictegravir (BIC) Potential Low-Level Resistance cabotegravir (CAB) Low-Level Resistance dolutegravir (DTG) Potential Low-Level Resistance elvitegravir (EVG) High-Level Resistance raltegravir (RAL) High-Level Resistance IN comments  Major N155H is a non-polymorphic mutation selected in patients receiving RAL, EVG, and rarely DTG. It is associated with high-level reductions in RAL and EVG susceptibility. It causes low-level reductions in DTG susceptibility. Other S230N is a polymorphism that is not associated with reduced INSTI susceptibility. This virus is predicted to have low-level reduced susceptibility to CAB. The use of the combination of CAB/RPV should be considered to be relatively contraindicated. Mutation scoring: IN HIVDB 9.1 (2021-04-02) Drug resistance mutation scores of INSTI: Rule BIC CAB DTG EVG RAL N155H 10 25 10  60 60   She has been typically well suppressed on SYMTUZA.  Certainly I could not find any [place] of conventional genotypic resistance which she must of surely had with her integrase failure.  Unfortunately she had a TIA recently in March and was hospitalized at [facility].  Her antiplatelet therapy was intensified with Plavix being added to aspirin.  Lipitor was increased though it does not appear that the  providers took into account the drug drug interaction with SYMTUZA because she is on maximum dose Lipitor despite the booster of SYMTUZA making atorvastatin levels even higher.  She has not experienced any evidence of myositis but clearly should be on a different dose of statin to account for the interaction with cobicistat.  Blood pressure was high today in the 150s over 90s and she states that she did not take her blood pressure medicines today because she normally takes them with food.  She is undergoing fair amount of stress due to the fact that her late son's murder is going to be released from jail the next month or 2 and this causes her some significant stress and depressive symptoms at times.  She does have a good support group which is get a great source of strength for her.  She is walking more often.  She is on Plavix alone though neurology as noted indicated she should be on Plavix and aspirin for 3 months followed by aspirin alone.  She says she is seen her primary care physician Dr. [facility] though I cannot find any notes in care everywhere from Dr. Jaynie Collins.  She says that Dr. Jaynie Collins is feeling her antihypertensive medications and all of her known HIV related medications.  Also has disability forms that she says were filled out partially by Dr. Jaynie Collins.  When I went into Epic I could not find any recent notes by Dr. Jaynie Collins but she does not document in the EMR epic.  Patient has not been seen by neurology though they had wished to see her in follow-up it does not appear that that was arranged for at least that she has been seen.  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   Nonallopathic lesion of head 11/16/2017   Shingles    2000    Past Surgical History:  Procedure Laterality Date   ABDOMINAL HYSTERECTOMY  1993   Partial   CESAREAN SECTION     LAPAROSCOPIC ASSISTED VENTRAL HERNIA REPAIR N/A 01/02/2015   Procedure: LAPAROSCOPIC VENTRAL WALL HERNIA REPAIR;  Surgeon: Karie Soda, MD;  Location: [facility];  Service: General;  Laterality: N/A;  With MESH   TONSILLECTOMY  1988    Family History  Problem Relation Age of Onset   Diabetes type II Brother    Migraines Neg Hx       Social History   Socioeconomic History   Marital status: Single    Spouse name: Not on file   Number of children: Not on file   Years of education: HS   Highest education level: Not on file  Occupational History   Occupation: disabled  Tobacco Use   Smoking status: Former    Pack years: 0.00    Types: Cigarettes   Smokeless tobacco: Never  Substance and Sexual Activity   Alcohol use: Yes    Comment: rare red wine    Drug use: Yes    Frequency: 14.0 times per week    Types: Marijuana   Sexual activity: Not Currently    Comment: given condoms.  Other Topics Concern   Not on file  Social History Narrative   Not on file   Social Determinants of Health   Financial Resource Strain: Not on file  Food Insecurity: Not on file  Transportation Needs: Not on file  Physical Activity: Not on file  Stress: Not on file  Social Connections: Not on file    Allergies  Allergen Reactions   Morphine Other (See Comments)    Burning under the skin   Doxycycline Hyclate Rash     Current Outpatient Medications:    atorvastatin (LIPITOR) 80 MG tablet, Take 1 tablet (80 mg total) by mouth daily., Disp: 90 tablet, Rfl: 2   clopidogrel (PLAVIX) 75 MG tablet, Take 1 tablet (75 mg total) by mouth daily., Disp: 90 tablet, Rfl: 0   cyclobenzaprine (FLEXERIL) 10  MG tablet, Take 10 mg by mouth 3 (three) times daily as needed., Disp: , Rfl:    Darunavir-Cobicisctat-Emtricitabine-Tenofovir Alafenamide (SYMTUZA) 800-150-200-10 MG TABS, Take 1 tablet by mouth daily with breakfast., Disp: 30 tablet, Rfl: 11   gabapentin (NEURONTIN) 300 MG capsule, TAKE 1 CAPSULE BY MOUTH THREE TIMES A DAY (Patient taking differently: Take 300 mg by mouth 3 (three) times daily as needed (nerve pain).), Disp: 270 capsule, Rfl: 1   metoprolol succinate (TOPROL-XL) 50 MG 24 hr tablet, TAKE 1 TABLET BY MOUTH EVERYDAY AT BEDTIME (Patient taking differently: Take 50 mg by mouth daily.), Disp: 90 tablet, Rfl: 1   nystatin (MYCOSTATIN/NYSTOP) powder, Apply 1 application topically daily as needed (redness)., Disp: , Rfl:  omeprazole (PRILOSEC) 20 MG capsule, TAKE 1 CAPSULE BY MOUTH TWICE A DAY, Disp: 180 capsule, Rfl: 3   valACYclovir (VALTREX) 1000 MG tablet, Take 1 tablet (1,000 mg total) by mouth daily., Disp: 90 tablet, Rfl: 11   aspirin EC 325 MG EC tablet, Take 1 tablet (325 mg total) by mouth daily. (Patient not taking: Reported on 05/13/2021), Disp: 120 tablet, Rfl: 0   cloNIDine (CATAPRES) 0.1 MG tablet, Take 0.1 mg by mouth daily as needed (night sweats). (Patient not taking: Reported on 05/13/2021), Disp: , Rfl:    hydrochlorothiazide (HYDRODIURIL) 25 MG tablet, TAKE 1 TABLET BY MOUTH EVERY DAY (Patient not taking: Reported on 05/13/2021), Disp: 90 tablet, Rfl: 0   Review of Systems  Constitutional:  Negative for chills and fever.  HENT:  Negative for congestion and sore throat.   Eyes:  Negative for photophobia.  Respiratory:  Negative for cough, shortness of breath and wheezing.   Cardiovascular:  Negative for chest pain, palpitations and leg swelling.  Gastrointestinal:  Negative for abdominal pain, blood in stool, constipation, diarrhea, nausea and vomiting.  Genitourinary:  Negative for dysuria, flank pain and hematuria.  Musculoskeletal:  Negative for arthralgias, back  pain, gait problem, joint swelling, myalgias and neck pain.  Skin:  Negative for color change and pallor.  Neurological:  Positive for numbness. Negative for dizziness, weakness and headaches.  Hematological:  Does not bruise/bleed easily.  Psychiatric/Behavioral:  Positive for dysphoric mood. Negative for agitation, sleep disturbance and suicidal ideas. The patient is nervous/anxious. The patient is not hyperactive.       Objective:   Physical Exam Constitutional:      General: She is not in acute distress.    Appearance: She is not diaphoretic.  HENT:     Head: Normocephalic and atraumatic.     Right Ear: External ear normal.     Left Ear: External ear normal.     Nose: Nose normal.     Mouth/Throat:     Pharynx: No oropharyngeal exudate.  Eyes:     General: No scleral icterus.       Right eye: No discharge.        Left eye: No discharge.     Extraocular Movements: Extraocular movements intact.     Conjunctiva/sclera: Conjunctivae normal.  Cardiovascular:     Rate and Rhythm: Normal rate and regular rhythm.     Heart sounds:    No friction rub.  Pulmonary:     Effort: Pulmonary effort is normal. No respiratory distress.     Breath sounds: No wheezing or rales.  Abdominal:     General: There is no distension.     Palpations: Abdomen is soft.     Tenderness: There is no rebound.  Musculoskeletal:        General: No tenderness. Normal range of motion.     Cervical back: Normal range of motion and neck supple.  Lymphadenopathy:     Cervical: No cervical adenopathy.  Skin:    General: Skin is warm and dry.     Coloration: Skin is not jaundiced or pale.     Findings: No erythema, lesion or rash.  Neurological:     Mental Status: She is alert and oriented to person, place, and time.     Coordination: Coordination normal.  Psychiatric:        Mood and Affect: Mood normal.        Behavior: Behavior normal.        Thought Content: Thought  content normal.        Judgment:  Judgment normal.          Assessment & Plan:   HIV disease: Continue SYMTUZA check labs today we will also check an HIV gene genosure archive.  We will see if we can find her conventional HIV genotypic tests from 2016 when she had integrase resistance documented.  TIA and Hypertension: Fully these medications are really being filled that she states they are being.  Could not find the notes as mentioned in epic.  Also referred to neurology.  Hyperlipidemia: I have changed her from Lipitor to Crestor at 20 mg which is the maximum dose of the statin can be given in the context of cobicistat   COVID prevention she has had 4 doses of mRNA vaccine.  Obesity and pre-diabetes. She is making efforts to lose weight   Depression grieving and anxiety due to murder of her son and the release in prison of his murder.  Referred her to counseling here and she will continue with her support group.  I spent more than 40 minutes with the patient including  face to face counseling of the patient personally reviewing radiographs, along with pertinent laboratory microbiological, virological data, review of medical records in preparation for the visit and during the visit and in coordination of her care.

## 2021-05-14 LAB — MICROALBUMIN / CREATININE URINE RATIO
Creatinine, Urine: 43 mg/dL (ref 20–275)
Microalb Creat Ratio: 9 mcg/mg creat (ref <30)
Microalb, Ur: 0.4 mg/dL

## 2021-05-14 LAB — URINE CYTOLOGY ANCILLARY ONLY
Chlamydia: NEGATIVE
Comment: NEGATIVE
Comment: NORMAL
Neisseria Gonorrhea: NEGATIVE

## 2021-05-14 LAB — T-HELPER CELL (CD4) - (RCID CLINIC ONLY)
CD4 % Helper T Cell: 24 % — ABNORMAL LOW (ref 33–65)
CD4 T Cell Abs: 736 /uL (ref 400–1790)

## 2021-05-27 LAB — CBC WITH DIFFERENTIAL/PLATELET
Absolute Monocytes: 580 cells/uL (ref 200–950)
Basophils Absolute: 92 cells/uL (ref 0–200)
Basophils Relative: 1.1 %
Eosinophils Absolute: 454 cells/uL (ref 15–500)
Eosinophils Relative: 5.4 %
HCT: 40.4 % (ref 35.0–45.0)
Hemoglobin: 13.2 g/dL (ref 11.7–15.5)
Lymphs Abs: 3352 cells/uL (ref 850–3900)
MCH: 28.4 pg (ref 27.0–33.0)
MCHC: 32.7 g/dL (ref 32.0–36.0)
MCV: 87.1 fL (ref 80.0–100.0)
MPV: 10.3 fL (ref 7.5–12.5)
Monocytes Relative: 6.9 %
Neutro Abs: 3923 cells/uL (ref 1500–7800)
Neutrophils Relative %: 46.7 %
Platelets: 423 10*3/uL — ABNORMAL HIGH (ref 140–400)
RBC: 4.64 10*6/uL (ref 3.80–5.10)
RDW: 13.4 % (ref 11.0–15.0)
Total Lymphocyte: 39.9 %
WBC: 8.4 10*3/uL (ref 3.8–10.8)

## 2021-05-27 LAB — COMPLETE METABOLIC PANEL WITH GFR
AG Ratio: 1.7 (calc) (ref 1.0–2.5)
ALT: 31 U/L — ABNORMAL HIGH (ref 6–29)
AST: 22 U/L (ref 10–35)
Albumin: 4.7 g/dL (ref 3.6–5.1)
Alkaline phosphatase (APISO): 133 U/L (ref 37–153)
BUN: 12 mg/dL (ref 7–25)
CO2: 29 mmol/L (ref 20–32)
Calcium: 9.7 mg/dL (ref 8.6–10.4)
Chloride: 104 mmol/L (ref 98–110)
Creat: 0.96 mg/dL (ref 0.50–1.03)
Globulin: 2.8 g/dL (calc) (ref 1.9–3.7)
Glucose, Bld: 102 mg/dL — ABNORMAL HIGH (ref 65–99)
Potassium: 4.9 mmol/L (ref 3.5–5.3)
Sodium: 141 mmol/L (ref 135–146)
Total Bilirubin: 0.3 mg/dL (ref 0.2–1.2)
Total Protein: 7.5 g/dL (ref 6.1–8.1)
eGFR: 70 mL/min/{1.73_m2} (ref >=60)

## 2021-05-27 LAB — LIPID PANEL
Cholesterol: 198 mg/dL (ref <200)
HDL: 38 mg/dL — ABNORMAL LOW (ref >=50)
LDL Cholesterol (Calc): 131 mg/dL (calc) — ABNORMAL HIGH
Non-HDL Cholesterol (Calc): 160 mg/dL (calc) — ABNORMAL HIGH (ref <130)
Total CHOL/HDL Ratio: 5.2 (calc) — ABNORMAL HIGH (ref <5.0)
Triglycerides: 159 mg/dL — ABNORMAL HIGH (ref <150)

## 2021-05-27 LAB — RPR: RPR Ser Ql: NONREACTIVE

## 2021-05-27 LAB — HIV-1 RNA ULTRAQUANT REFLEX TO GENTYP+
HIV 1 RNA Quant: 393 copies/mL — ABNORMAL HIGH
HIV-1 RNA Quant, Log: 2.59 Log copies/mL — ABNORMAL HIGH

## 2021-08-18 ENCOUNTER — Ambulatory Visit (INDEPENDENT_AMBULATORY_CARE_PROVIDER_SITE_OTHER): Payer: Medicare Other | Admitting: Infectious Disease

## 2021-08-18 ENCOUNTER — Encounter: Payer: Self-pay | Admitting: Infectious Disease

## 2021-08-18 ENCOUNTER — Other Ambulatory Visit: Payer: Self-pay

## 2021-08-18 ENCOUNTER — Ambulatory Visit: Payer: Medicare Other

## 2021-08-18 ENCOUNTER — Ambulatory Visit (INDEPENDENT_AMBULATORY_CARE_PROVIDER_SITE_OTHER): Payer: Medicare Other

## 2021-08-18 VITALS — BP 174/97 | HR 78 | Temp 98.1°F | Ht 64.0 in | Wt 232.0 lb

## 2021-08-18 DIAGNOSIS — B009 Herpesviral infection, unspecified: Secondary | ICD-10-CM | POA: Diagnosis not present

## 2021-08-18 DIAGNOSIS — Z23 Encounter for immunization: Secondary | ICD-10-CM

## 2021-08-18 DIAGNOSIS — I10 Essential (primary) hypertension: Secondary | ICD-10-CM | POA: Diagnosis not present

## 2021-08-18 DIAGNOSIS — F330 Major depressive disorder, recurrent, mild: Secondary | ICD-10-CM

## 2021-08-18 DIAGNOSIS — F4321 Adjustment disorder with depressed mood: Secondary | ICD-10-CM

## 2021-08-18 DIAGNOSIS — E785 Hyperlipidemia, unspecified: Secondary | ICD-10-CM

## 2021-08-18 DIAGNOSIS — B20 Human immunodeficiency virus [HIV] disease: Secondary | ICD-10-CM

## 2021-08-18 DIAGNOSIS — G459 Transient cerebral ischemic attack, unspecified: Secondary | ICD-10-CM

## 2021-08-18 HISTORY — DX: Hyperlipidemia, unspecified: E78.5

## 2021-08-18 NOTE — Progress Notes (Signed)
Subjective:  \Chief complaint: Follow-up for HIV disease on medications with some depressive symptoms given that her birthday a couple of days ago and she always misses her son on her birthday.   Patient ID: [name], female    DOB: [birth year], 55 y.o.   MRN: [id number]  HPI  55 year old African American lady with HIV and AIDS who this  once again been able to perfectly suppress her viral load to <20 on PREZCOBIX and Truvada after having failed STRIBILD with with resistanceto integrase including raltegravir and EVG but not to DTG, N155H,S230N,D232E in 2016.    N155H INSTI Accessory Mutations: None IN Other Mutations: S230N , D232E Integrase Strand Transfer Inhibitors  bictegravir (BIC) Potential Low-Level Resistance cabotegravir (CAB) Low-Level Resistance dolutegravir (DTG) Potential Low-Level Resistance elvitegravir (EVG) High-Level Resistance raltegravir (RAL) High-Level Resistance IN comments  Major N155H is a non-polymorphic mutation selected in patients receiving RAL, EVG, and rarely DTG. It is associated with high-level reductions in RAL and EVG susceptibility. It causes low-level reductions in DTG susceptibility. Other S230N is a polymorphism that is not associated with reduced INSTI susceptibility. This virus is predicted to have low-level reduced susceptibility to CAB. The use of the combination of CAB/RPV should be considered to be relatively contraindicated. Mutation scoring: IN HIVDB 9.1 (2021-04-02) Drug resistance mutation scores of INSTI: Rule BIC CAB DTG EVG RAL N155H 10 25 10  60 60   She has been typically well suppressed on SYMTUZA.  Certainly I could not find any record of conventional genotypic resistance which she must of surely had with her integrase failure.  Unfortunately she had a TIA rnd was hospitalized at [facility].  Her antiplatelet therapy was intensified with Plavix being added to aspirin.  Lipitor was increased though  it did not appear that the providers took into account the drug drug interaction with SYMTUZA because she is on maximum dose Lipitor despite the booster of SYMTUZA making atorvastatin levels even higher.  She has not experienced any evidence of myositis but clearly should be on a different dose of statin to account for the interaction with cobicistat.  Blood pressure was high today in the 150s over 90s I saw her in July.   At that time she was undergoing fair amount of stress due to the fact that her late son's murder is going to be released from jail the next month or 2 and this causes her some significant stress and depressive symptoms at times.  She was  on Plavix alone though neurology as noted indicated she should be on Plavix and aspirin for 3 months followed by aspirin alone.  She told me that she saw her primary care physician Dr. August though I cannot find any notes in care everywhere from Dr. Jaynie Collins.  She says that Dr. Jaynie Collins is managing her anti hypertensive medications.  Also has disability forms that she says were filled out partially by Dr. Jaynie Collins.  She returns to clinic today.  When we saw her last her viral load was higher than it typically is in the 300s likely due to her having missed some SYMTUZA.  Blood pressure is worse today in clinic when the 160 systolic over 90s.  She is taking metoprolol but nothing else for her blood pressure at present.  She is a little bit down because was her birthday 3 days ago and she always misses her son on her birthday.  She says she was very frustrated with the clinic that she goes to her  primary care because of the lack of checking the drug drug interaction between her statin and her SYMTUZA.  I do need her to have aggressive management of her blood pressure and encouraged her to see primary care for optimization of blood pressure control.   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   Nonallopathic lesion of head 11/16/2017   Shingles    2000    Past Surgical History:  Procedure Laterality Date   ABDOMINAL HYSTERECTOMY  1993   Partial   CESAREAN SECTION     LAPAROSCOPIC ASSISTED VENTRAL HERNIA REPAIR N/A 01/02/2015   Procedure: LAPAROSCOPIC VENTRAL WALL HERNIA REPAIR;  Surgeon: Karie Soda, MD;  Location: [facility];  Service: General;  Laterality: N/A;  With MESH   TONSILLECTOMY  1988    Family History  Problem Relation Age of Onset   Diabetes type II Brother    Migraines Neg Hx       Social History   Socioeconomic History   Marital status: Single    Spouse name: Not on file   Number of children: Not on file   Years of education: HS   Highest education level: Not on file  Occupational History   Occupation: disabled  Tobacco Use   Smoking status: Former    Types: Cigarettes   Smokeless tobacco: Never  Substance and Sexual Activity   Alcohol use: Yes    Comment: rare red wine    Drug use: Yes    Frequency: 14.0 times per week    Types: Marijuana   Sexual activity: Not Currently    Comment: given condoms.  Other Topics Concern   Not on file  Social History Narrative   Not on file   Social Determinants of Health   Financial Resource Strain: Not on file  Food Insecurity: Not on file  Transportation Needs: Not on file  Physical Activity: Not on file  Stress: Not on file  Social Connections: Not on file    Allergies  Allergen Reactions   Morphine Other (See Comments)    Burning under the skin    Doxycycline Hyclate Rash     Current Outpatient Medications:    aspirin EC 325 MG EC tablet, Take 1 tablet (325 mg total) by mouth daily. (Patient not taking: Reported on 05/13/2021), Disp: 120 tablet, Rfl: 0   cloNIDine (CATAPRES) 0.1 MG tablet, Take 0.1 mg by mouth daily as needed (night sweats). (Patient not taking: Reported on 05/13/2021), Disp: , Rfl:    clopidogrel (PLAVIX) 75 MG tablet, Take 1 tablet (75 mg total) by mouth daily., Disp: 90 tablet, Rfl: 0   cyclobenzaprine (FLEXERIL) 10 MG tablet, Take 10 mg by mouth 3 (three) times daily as needed., Disp: , Rfl:    Darunavir-Cobicisctat-Emtricitabine-Tenofovir Alafenamide (SYMTUZA) 800-150-200-10 MG TABS, Take 1 tablet by mouth daily with breakfast., Disp: 30 tablet, Rfl: 11  gabapentin (NEURONTIN) 300 MG capsule, TAKE 1 CAPSULE BY MOUTH THREE TIMES A DAY (Patient taking differently: Take 300 mg by mouth 3 (three) times daily as needed (nerve pain).), Disp: 270 capsule, Rfl: 1   hydrochlorothiazide (HYDRODIURIL) 25 MG tablet, TAKE 1 TABLET BY MOUTH EVERY DAY (Patient not taking: Reported on 05/13/2021), Disp: 90 tablet, Rfl: 0   metoprolol succinate (TOPROL-XL) 50 MG 24 hr tablet, TAKE 1 TABLET BY MOUTH EVERYDAY AT BEDTIME (Patient taking differently: Take 50 mg by mouth daily.), Disp: 90 tablet, Rfl: 1   nystatin (MYCOSTATIN/NYSTOP) powder, Apply 1 application topically daily as needed (redness)., Disp: , Rfl:    omeprazole (PRILOSEC) 20 MG capsule, TAKE 1 CAPSULE BY MOUTH TWICE A DAY, Disp: 180 capsule, Rfl: 3   rosuvastatin (CRESTOR) 20 MG tablet, Take 1 tablet (20 mg total) by mouth daily., Disp: 30 tablet, Rfl: 11   valACYclovir (VALTREX) 1000 MG tablet, Take 1 tablet (1,000 mg total) by mouth daily., Disp: 90 tablet, Rfl: 11   Review of Systems  Constitutional:  Negative for chills and fever.  HENT:  Negative for congestion and sore throat.   Eyes:  Negative for photophobia.  Respiratory:  Negative for cough, shortness of breath and  wheezing.   Cardiovascular:  Negative for chest pain, palpitations and leg swelling.  Gastrointestinal:  Negative for abdominal pain, blood in stool, constipation, diarrhea, nausea and vomiting.  Genitourinary:  Negative for dysuria, flank pain and hematuria.  Musculoskeletal:  Negative for back pain and myalgias.  Skin:  Negative for rash.  Neurological:  Negative for dizziness, weakness and headaches.  Hematological:  Does not bruise/bleed easily.  Psychiatric/Behavioral:  Positive for dysphoric mood. Negative for suicidal ideas.       Objective:   Physical Exam Constitutional:      General: She is not in acute distress.    Appearance: Normal appearance. She is well-developed. She is not ill-appearing or diaphoretic.  HENT:     Head: Normocephalic and atraumatic.     Right Ear: Hearing and external ear normal.     Left Ear: Hearing and external ear normal.     Nose: No nasal deformity or rhinorrhea.  Eyes:     General: No scleral icterus.    Conjunctiva/sclera: Conjunctivae normal.     Right eye: Right conjunctiva is not injected.     Left eye: Left conjunctiva is not injected.     Pupils: Pupils are equal, round, and reactive to light.  Neck:     Vascular: No JVD.  Cardiovascular:     Rate and Rhythm: Normal rate and regular rhythm.     Heart sounds: S1 normal and S2 normal.  Pulmonary:     Effort: Pulmonary effort is normal. No respiratory distress.     Breath sounds: No wheezing.  Abdominal:     General: There is no distension.     Palpations: Abdomen is soft.  Musculoskeletal:        General: Normal range of motion.     Right shoulder: Normal.     Left shoulder: Normal.     Cervical back: Normal range of motion and neck supple.     Right hip: Normal.     Left hip: Normal.     Right knee: Normal.     Left knee: Normal.  Lymphadenopathy:     Head:     Right side of head: No submandibular, preauricular or posterior auricular adenopathy.     Left side of head: No  submandibular,  preauricular or posterior auricular adenopathy.     Cervical: No cervical adenopathy.     Right cervical: No superficial or deep cervical adenopathy.    Left cervical: No superficial or deep cervical adenopathy.  Skin:    General: Skin is warm and dry.     Coloration: Skin is not pale.     Findings: No abrasion, bruising, ecchymosis, erythema, lesion or rash.     Nails: There is no clubbing.  Neurological:     General: No focal deficit present.     Mental Status: She is alert and oriented to person, place, and time.     Sensory: No sensory deficit.     Coordination: Coordination normal.     Gait: Gait normal.  Psychiatric:        Attention and Perception: She is attentive.        Mood and Affect: Mood normal.        Speech: Speech normal.        Behavior: Behavior normal. Behavior is cooperative.        Thought Content: Thought content normal.        Judgment: Judgment normal.          Assessment & Plan:   HIV disease:  Recheck viral load CD4 count CBC CMP  Continue SYMTUZA prescription  Hypertension: Poorly controlled she is only on metoprolol which she did not yet take today.  She clearly needs other agents and should see primary care for optimization of blood pressure.   Depression grieving with anxiety due to the loss of her son: She is a good support network and we have had her introduced to counselors here as well  Morbid obesity: She is trying to lose weight through exercise and diet.    Vaccine counseling she received her flu shot and her updated bivalent COVID vacccine

## 2021-08-18 NOTE — Addendum Note (Signed)
Addended by: Harley Alto on: 08/18/2021 11:40 AM   Modules accepted: Orders

## 2021-08-18 NOTE — Progress Notes (Signed)
   Covid-19 Vaccination Clinic  Name:  Tunya Held    MRN: [id number] DOB: [birth year]  08/18/2021  Ms. Lucente was observed post Covid-19 immunization for 15 minutes without incident. She was provided with Vaccine Information Sheet and instruction to access the V-Safe system.   Ms. Sonnen was instructed to call 911 with any severe reactions post vaccine: Difficulty breathing  Swelling of face and throat  A fast heartbeat  A bad rash all over body  Dizziness and weakness  Antonie Borjon T Pricilla Loveless

## 2021-08-19 LAB — T-HELPER CELL (CD4) - (RCID CLINIC ONLY)
CD4 % Helper T Cell: 24 % — ABNORMAL LOW (ref 33–65)
CD4 T Cell Abs: 873 /uL (ref 400–1790)

## 2021-08-20 LAB — COMPLETE METABOLIC PANEL WITH GFR
AG Ratio: 1.4 (calc) (ref 1.0–2.5)
ALT: 29 U/L (ref 6–29)
AST: 17 U/L (ref 10–35)
Albumin: 4.2 g/dL (ref 3.6–5.1)
Alkaline phosphatase (APISO): 98 U/L (ref 37–153)
BUN/Creatinine Ratio: 13 (calc) (ref 6–22)
BUN: 13 mg/dL (ref 7–25)
CO2: 30 mmol/L (ref 20–32)
Calcium: 9.6 mg/dL (ref 8.6–10.4)
Chloride: 104 mmol/L (ref 98–110)
Creat: 1.04 mg/dL — ABNORMAL HIGH (ref 0.50–1.03)
Globulin: 2.9 g/dL (calc) (ref 1.9–3.7)
Glucose, Bld: 85 mg/dL (ref 65–99)
Potassium: 4.3 mmol/L (ref 3.5–5.3)
Sodium: 142 mmol/L (ref 135–146)
Total Bilirubin: 0.3 mg/dL (ref 0.2–1.2)
Total Protein: 7.1 g/dL (ref 6.1–8.1)
eGFR: 63 mL/min/{1.73_m2} (ref >=60)

## 2021-08-20 LAB — CBC WITH DIFFERENTIAL/PLATELET
Absolute Monocytes: 611 cells/uL (ref 200–950)
Basophils Absolute: 85 cells/uL (ref 0–200)
Basophils Relative: 0.9 %
Eosinophils Absolute: 555 cells/uL — ABNORMAL HIGH (ref 15–500)
Eosinophils Relative: 5.9 %
HCT: 40.5 % (ref 35.0–45.0)
Hemoglobin: 13.2 g/dL (ref 11.7–15.5)
Lymphs Abs: 3863 cells/uL (ref 850–3900)
MCH: 28.1 pg (ref 27.0–33.0)
MCHC: 32.6 g/dL (ref 32.0–36.0)
MCV: 86.2 fL (ref 80.0–100.0)
MPV: 10.1 fL (ref 7.5–12.5)
Monocytes Relative: 6.5 %
Neutro Abs: 4286 cells/uL (ref 1500–7800)
Neutrophils Relative %: 45.6 %
Platelets: 481 10*3/uL — ABNORMAL HIGH (ref 140–400)
RBC: 4.7 10*6/uL (ref 3.80–5.10)
RDW: 13.3 % (ref 11.0–15.0)
Total Lymphocyte: 41.1 %
WBC: 9.4 10*3/uL (ref 3.8–10.8)

## 2021-08-20 LAB — HIV-1 RNA QUANT-NO REFLEX-BLD
HIV 1 RNA Quant: 63 Copies/mL — ABNORMAL HIGH
HIV-1 RNA Quant, Log: 1.8 Log cps/mL — ABNORMAL HIGH

## 2021-08-20 LAB — LIPID PANEL
Cholesterol: 245 mg/dL — ABNORMAL HIGH (ref <200)
HDL: 41 mg/dL — ABNORMAL LOW (ref >=50)
LDL Cholesterol (Calc): 170 mg/dL (calc) — ABNORMAL HIGH
Non-HDL Cholesterol (Calc): 204 mg/dL (calc) — ABNORMAL HIGH (ref <130)
Total CHOL/HDL Ratio: 6 (calc) — ABNORMAL HIGH (ref <5.0)
Triglycerides: 182 mg/dL — ABNORMAL HIGH (ref <150)

## 2021-08-20 LAB — RPR: RPR Ser Ql: NONREACTIVE

## 2021-09-16 ENCOUNTER — Emergency Department (HOSPITAL_BASED_OUTPATIENT_CLINIC_OR_DEPARTMENT_OTHER)
Admission: EM | Admit: 2021-09-16 | Discharge: 2021-09-16 | Disposition: A | Discharge status: Home / Self Care | Payer: Medicare Other | Attending: Emergency Medicine | Admitting: Emergency Medicine

## 2021-09-16 ENCOUNTER — Emergency Department (HOSPITAL_BASED_OUTPATIENT_CLINIC_OR_DEPARTMENT_OTHER): Payer: Medicare Other

## 2021-09-16 ENCOUNTER — Other Ambulatory Visit: Payer: Self-pay

## 2021-09-16 ENCOUNTER — Encounter (HOSPITAL_BASED_OUTPATIENT_CLINIC_OR_DEPARTMENT_OTHER): Payer: Self-pay | Admitting: Emergency Medicine

## 2021-09-16 DIAGNOSIS — Z20822 Contact with and (suspected) exposure to covid-19: Secondary | ICD-10-CM | POA: Diagnosis not present

## 2021-09-16 DIAGNOSIS — Z7982 Long term (current) use of aspirin: Secondary | ICD-10-CM | POA: Insufficient documentation

## 2021-09-16 DIAGNOSIS — I10 Essential (primary) hypertension: Secondary | ICD-10-CM | POA: Diagnosis not present

## 2021-09-16 DIAGNOSIS — R059 Cough, unspecified: Secondary | ICD-10-CM | POA: Diagnosis present

## 2021-09-16 DIAGNOSIS — R000 Tachycardia, unspecified: Secondary | ICD-10-CM | POA: Diagnosis not present

## 2021-09-16 DIAGNOSIS — J101 Influenza due to other identified influenza virus with other respiratory manifestations: Secondary | ICD-10-CM | POA: Insufficient documentation

## 2021-09-16 DIAGNOSIS — Z79899 Other long term (current) drug therapy: Secondary | ICD-10-CM | POA: Insufficient documentation

## 2021-09-16 DIAGNOSIS — Z7902 Long term (current) use of antithrombotics/antiplatelets: Secondary | ICD-10-CM | POA: Insufficient documentation

## 2021-09-16 DIAGNOSIS — Z21 Asymptomatic human immunodeficiency virus [HIV] infection status: Secondary | ICD-10-CM | POA: Diagnosis not present

## 2021-09-16 DIAGNOSIS — Z87891 Personal history of nicotine dependence: Secondary | ICD-10-CM | POA: Insufficient documentation

## 2021-09-16 LAB — COMPREHENSIVE METABOLIC PANEL
ALT: 53 U/L — ABNORMAL HIGH (ref 0–44)
AST: 48 U/L — ABNORMAL HIGH (ref 15–41)
Albumin: 4.3 g/dL (ref 3.5–5.0)
Alkaline Phosphatase: 93 U/L (ref 38–126)
Anion gap: 11 (ref 5–15)
BUN: 9 mg/dL (ref 6–20)
CO2: 26 mmol/L (ref 22–32)
Calcium: 8.9 mg/dL (ref 8.9–10.3)
Chloride: 102 mmol/L (ref 98–111)
Creatinine, Ser: 1.03 mg/dL — ABNORMAL HIGH (ref 0.44–1.00)
GFR, Estimated: >60 mL/min (ref >60)
Glucose, Bld: 126 mg/dL — ABNORMAL HIGH (ref 70–99)
Potassium: 3.3 mmol/L — ABNORMAL LOW (ref 3.5–5.1)
Sodium: 139 mmol/L (ref 135–145)
Total Bilirubin: 0.3 mg/dL (ref 0.3–1.2)
Total Protein: 8.1 g/dL (ref 6.5–8.1)

## 2021-09-16 LAB — D-DIMER, QUANTITATIVE: D-Dimer, Quant: <0.27 ug/mL-FEU (ref 0.00–0.50)

## 2021-09-16 LAB — CBC WITH DIFFERENTIAL/PLATELET
Abs Immature Granulocytes: 0.04 10*3/uL (ref 0.00–0.07)
Basophils Absolute: 0.1 10*3/uL (ref 0.0–0.1)
Basophils Relative: 1 %
Eosinophils Absolute: 0.2 10*3/uL (ref 0.0–0.5)
Eosinophils Relative: 4 %
HCT: 42.8 % (ref 36.0–46.0)
Hemoglobin: 13.8 g/dL (ref 12.0–15.0)
Immature Granulocytes: 1 %
Lymphocytes Relative: 38 %
Lymphs Abs: 2.3 10*3/uL (ref 0.7–4.0)
MCH: 27.7 pg (ref 26.0–34.0)
MCHC: 32.2 g/dL (ref 30.0–36.0)
MCV: 85.9 fL (ref 80.0–100.0)
Monocytes Absolute: 0.8 10*3/uL (ref 0.1–1.0)
Monocytes Relative: 14 %
Neutro Abs: 2.5 10*3/uL (ref 1.7–7.7)
Neutrophils Relative %: 42 %
Platelets: 377 10*3/uL (ref 150–400)
RBC: 4.98 MIL/uL (ref 3.87–5.11)
RDW: 14.2 % (ref 11.5–15.5)
WBC: 5.9 10*3/uL (ref 4.0–10.5)
nRBC: 0 % (ref 0.0–0.2)

## 2021-09-16 LAB — RESP PANEL BY RT-PCR (FLU A&B, COVID) ARPGX2
Influenza A by PCR: POSITIVE — AB
Influenza B by PCR: NEGATIVE
SARS Coronavirus 2 by RT PCR: NEGATIVE

## 2021-09-16 IMAGING — DX DG CHEST 1V PORT
1 series · 1 of 1 positions shown · non-contrast
Comparison: [DATE]

CLINICAL DATA: Cough for 2 days

EXAM:
PORTABLE CHEST 1 VIEW

[chest ap]
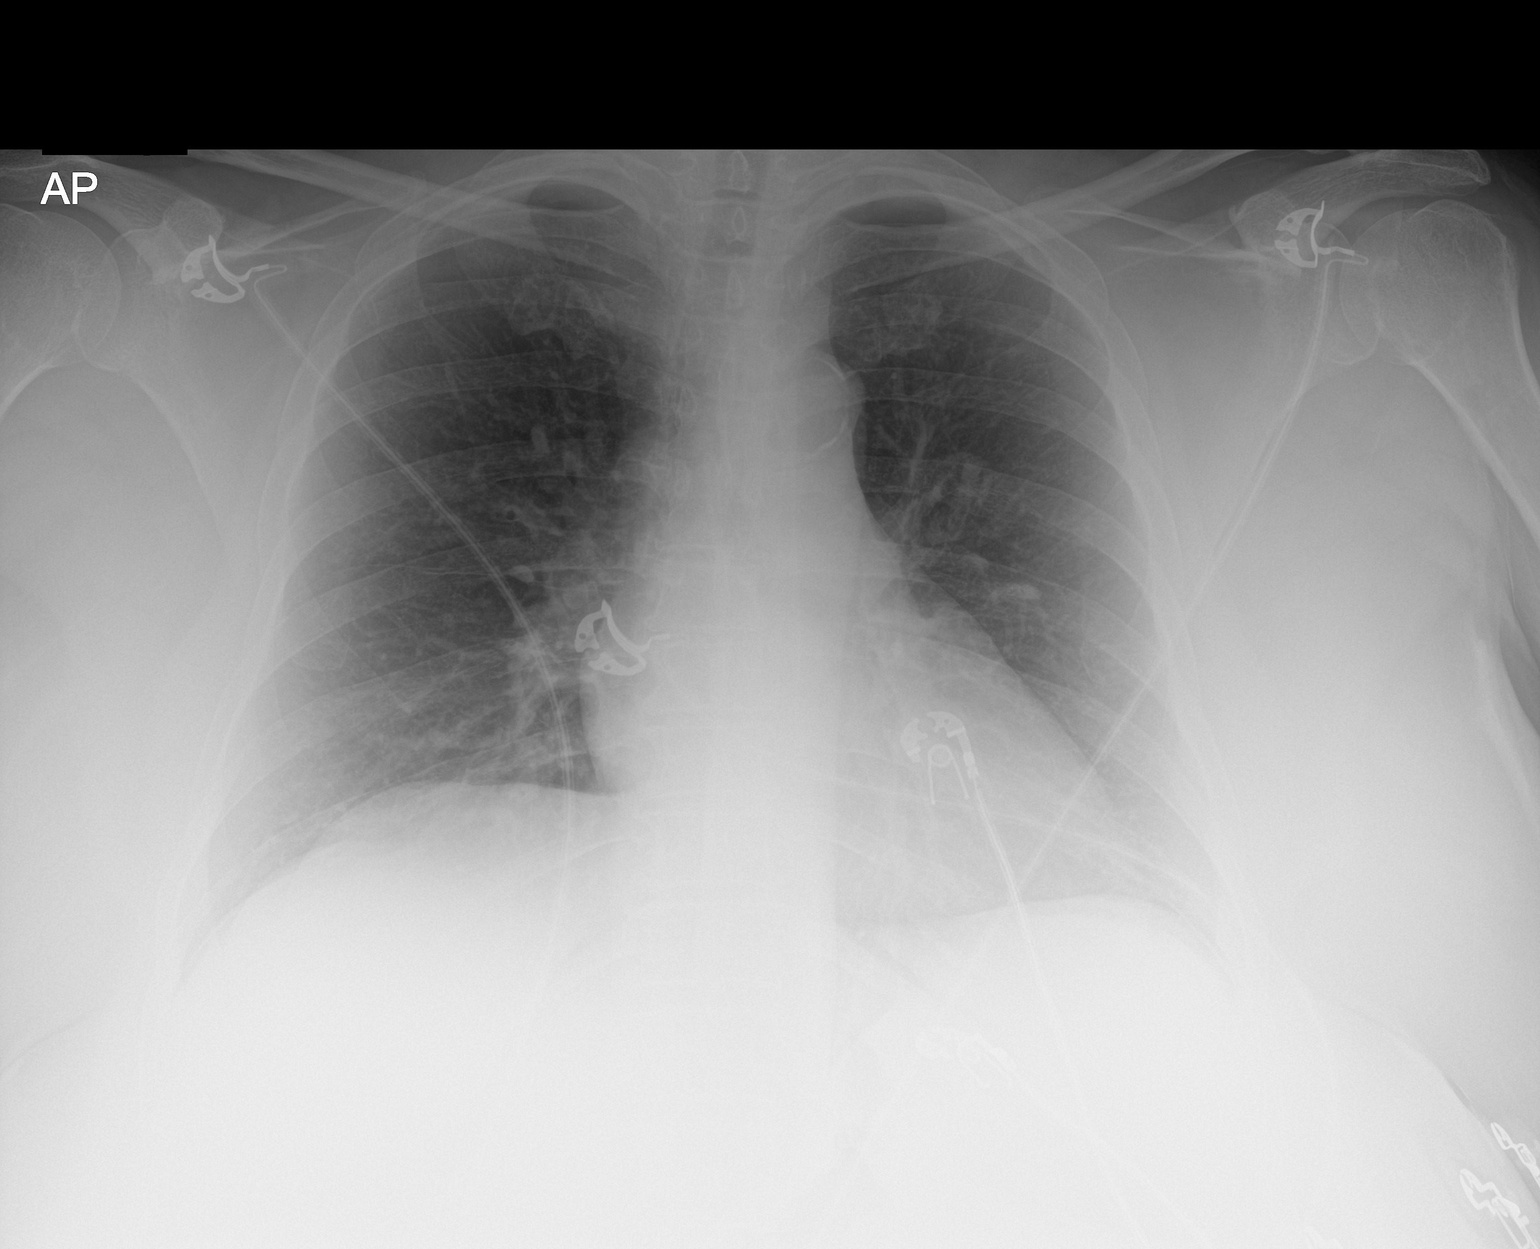

[1 of 1 positions shown; findings below may reference images not displayed]

FINDINGS: The heart size and mediastinal contours are within normal limits.
Atherosclerotic calcification of the aortic knob. No focal airspace
consolidation, pleural effusion, or pneumothorax. For the visualized
skeletal structures are unremarkable.
IMPRESSION: No active disease.

## 2021-09-16 MED ORDER — OSELTAMIVIR PHOSPHATE 75 MG PO CAPS: 75.0000 mg | ORAL_CAPSULE | Freq: Two times a day (BID) | ORAL | 0 refills | Status: DC

## 2021-09-16 MED ORDER — LACTATED RINGERS IV BOLUS: 1000.0000 mL | Freq: Once | INTRAVENOUS | Status: DC

## 2021-09-16 MED ORDER — ACETAMINOPHEN 500 MG PO TABS
1000.0000 mg | ORAL_TABLET | Freq: Once | ORAL | Status: AC
  Administered 2021-09-16: 1000 mg via ORAL
  Filled 2021-09-16: qty 2

## 2021-09-16 NOTE — Discharge Instructions (Addendum)
You were evaluated in the Emergency Department and after careful evaluation, we did not find any emergent condition requiring admission or further testing in the hospital.  Your exam/testing today was overall reassuring.  Your swab testing was positive for influenza.  Recommend continued oral hydration at home.  Your chest x-ray did not show evidence of a bacterial pneumonia and you do not have an elevated white count.  A prescription for Tamiflu has been sent to your pharmacy.  Recommend continued Tylenol and ibuprofen for muscle aches and fever.  Please return to the Emergency Department if you experience any worsening of your condition.  Thank you for allowing [place] to be a part of your care.

## 2021-09-16 NOTE — ED Notes (Signed)
IV attemp x1 with ultras sound unsuccessful, blood for labs obtained.

## 2021-09-16 NOTE — ED Triage Notes (Signed)
Reports cough x 2 days , family member with flu. Lethargic, sore all over

## 2021-09-16 NOTE — ED Provider Notes (Signed)
[facility] Provider Note   CSN: [id number] Arrival date & time: 09/16/21  [id number]     History Chief Complaint  Patient presents with   Cough    [name] is a 55 y.o. female.  HPI  55 year old female with medical history below to include HIV (last CD4 on 08/18/2021 873) [facility] the [facility] with flulike symptoms.  Patient states that she has had a cough and nasal congestion with generalized body aches for the last 2 days.  A family member recently tested positive for the flu.  She denies any fevers or chills.  She is tolerating oral intake.  Her cough is nonproductive.  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   Nonallopathic lesion of head 11/16/2017   Shingles    2000    Patient Active Problem List   Diagnosis Date Noted   Hyperlipidemia 08/18/2021   Grieving 05/13/2021   Depression with anxiety 05/13/2021   Transient ischemic attack (TIA) 01/07/2021   Transient ischemic attack 01/06/2021   Nonallopathic lesion of head 11/16/2017   Hypertension 11/16/2017   Muscle spasm 05/11/2017   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   Incarcerated incisional hernia s/p lap [place] repair 01/02/2015 12/18/2014   Benign essential HTN 12/18/2014   Snoring 09/17/2014   Excessive daytime sleepiness 09/17/2014   Apneic 09/17/2014   Morbid obesity ([place]) 09/17/2014   Trigeminal neuralgia 09/17/2014   Occipital neuralgia  09/17/2014   Migraine 08/30/2014   Migraine headache without aura 03/05/2014   Postmenopausal disorder 04/19/2012   Postoperative spinal headache 01/10/2012   Herpes zoster 12/21/2011   Immunization due 04/19/2011   Postmenopausal status 02/03/2011   HIV disease ([place]) 01/30/2007   Herpes simplex virus (HSV) infection 01/30/2007    Past Surgical History:  Procedure Laterality Date   ABDOMINAL HYSTERECTOMY  1993   Partial   CESAREAN SECTION     LAPAROSCOPIC ASSISTED VENTRAL HERNIA REPAIR N/A 01/02/2015   Procedure: LAPAROSCOPIC VENTRAL WALL HERNIA REPAIR;  Surgeon: Michael Boston, MD;  Location: [facility];  Service: General;  Laterality: N/A;  With [place]     OB History   No obstetric history on file.     Family History  Problem Relation Age of Onset   Diabetes type II Brother    Migraines Neg Hx     Social History   Tobacco Use   Smoking status: Former    Types: Cigarettes   Smokeless tobacco: Never  Substance Use Topics   Alcohol use: Yes    Comment: rare red wine    Drug use: Yes    Frequency: 14.0 times per week    Types: Marijuana    Home Medications Prior to Admission medications   Medication Sig Start Date End Date Taking? Authorizing Provider  aspirin EC 325 MG EC  tablet Take 1 tablet (325 mg total) by mouth daily. Patient not taking: No sig reported 01/08/21   Virl Axe, MD  cloNIDine (CATAPRES) 0.1 MG tablet Take 0.1 mg by mouth daily as needed (night sweats). Patient not taking: No sig reported 06/12/20   [provider]  clopidogrel (PLAVIX) 75 MG tablet Take 1 tablet (75 mg total) by mouth daily. 01/08/21   Virl Axe, MD  cyclobenzaprine (FLEXERIL) 10 MG tablet Take 10 mg by mouth 3 (three) times daily as needed. 04/24/21   [provider]  Darunavir-Cobicisctat-Emtricitabine-Tenofovir Alafenamide (SYMTUZA) 800-150-200-10 MG TABS Take 1 tablet by mouth daily with breakfast. 05/13/21   Tommy Medal, Lavell Islam, MD   gabapentin (NEURONTIN) 300 MG capsule TAKE 1 CAPSULE BY MOUTH THREE TIMES A DAY Patient taking differently: Take 300 mg by mouth 3 (three) times daily as needed (nerve pain). 09/03/20   Truman Hayward, MD  hydrochlorothiazide (HYDRODIURIL) 25 MG tablet TAKE 1 TABLET BY MOUTH EVERY DAY Patient not taking: No sig reported 04/14/20   Tommy Medal, Lavell Islam, MD  metoprolol succinate (TOPROL-XL) 50 MG 24 hr tablet TAKE 1 TABLET BY MOUTH EVERYDAY AT BEDTIME Patient taking differently: Take 50 mg by mouth daily. 06/02/20   Truman Hayward, MD  nystatin (MYCOSTATIN/NYSTOP) powder Apply 1 application topically daily as needed (redness). 11/07/20   [provider]  omeprazole (PRILOSEC) 20 MG capsule TAKE 1 CAPSULE BY MOUTH TWICE A DAY 03/02/21   Tommy Medal, Lavell Islam, MD  oseltamivir (TAMIFLU) 75 MG capsule Take 1 capsule (75 mg total) by mouth every 12 (twelve) hours. 09/16/21   Regan Lemming, MD  rosuvastatin (CRESTOR) 20 MG tablet Take 1 tablet (20 mg total) by mouth daily. 05/13/21   Truman Hayward, MD  valACYclovir (VALTREX) 1000 MG tablet Take 1 tablet (1,000 mg total) by mouth daily. 09/03/20   Truman Hayward, MD  pantoprazole sodium (PROTONIX) 40 mg/20 mL PACK Place 20 mLs (40 mg total) into feeding tube daily at 12 noon. 01/05/12 01/10/12  Truman Hayward, MD    Allergies    Morphine and Doxycycline hyclate  Review of Systems   Review of Systems  Constitutional:  Positive for fatigue. Negative for chills and fever.  HENT:  Negative for ear pain and sore throat.   Eyes:  Negative for pain and visual disturbance.  Respiratory:  Positive for cough. Negative for shortness of breath.   Cardiovascular:  Negative for chest pain and palpitations.  Gastrointestinal:  Negative for abdominal pain and vomiting.  Genitourinary:  Negative for dysuria and hematuria.  Musculoskeletal:  Positive for myalgias. Negative for arthralgias and back pain.  Skin:  Negative for color change  and rash.  Neurological:  Negative for seizures and syncope.  All other systems reviewed and are negative.  Physical Exam Updated Vital Signs BP (!) 155/108   Pulse (!) 105   Temp 98.5 F (36.9 C) (Oral)   Resp 20   Ht 5\' 6"  (1.676 m)   Wt 105.2 kg   SpO2 97%   BMI 37.45 kg/m   Physical Exam Vitals and nursing note reviewed.  Constitutional:      General: She is not in acute distress.    Appearance: She is well-developed.  HENT:     Head: Normocephalic and atraumatic.     Mouth/Throat:     Mouth: Mucous membranes are dry.  Eyes:     Conjunctiva/sclera: Conjunctivae normal.  Cardiovascular:  Rate and Rhythm: Regular rhythm. Tachycardia present.     Pulses: Normal pulses.     Heart sounds: No murmur heard. Pulmonary:     Effort: Pulmonary effort is normal. No respiratory distress.     Breath sounds: Normal breath sounds.  Abdominal:     Palpations: Abdomen is soft.     Tenderness: There is no abdominal tenderness.  Musculoskeletal:     Cervical back: Neck supple.  Skin:    General: Skin is warm and dry.  Neurological:     General: No focal deficit present.     Mental Status: She is alert. Mental status is at baseline.    ED Results / Procedures / Treatments   Labs (all labs ordered are listed, but only abnormal results are displayed) Labs Reviewed  RESP PANEL BY RT-PCR (FLU A&B, COVID) ARPGX2 - Abnormal; Notable for the following components:      Result Value   Influenza A by PCR POSITIVE (*)    All other components within normal limits  COMPREHENSIVE METABOLIC PANEL - Abnormal; Notable for the following components:   Potassium 3.3 (*)    Glucose, Bld 126 (*)    Creatinine, Ser 1.03 (*)    AST 48 (*)    ALT 53 (*)    All other components within normal limits  CBC WITH DIFFERENTIAL/PLATELET  D-DIMER, QUANTITATIVE    EKG EKG Interpretation  Date/Time:  Wednesday September 16 2021 08:42:20 EST Ventricular Rate:  101 PR Interval:  166 QRS  Duration: 70 QT Interval:  315 QTC Calculation: 409 R Axis:   51 Text Interpretation: Sinus tachycardia Abnormal R-wave progression, early transition Nonspecific ST depression Confirmed by Regan Lemming ([id number]) on 09/16/2021 9:46:55 AM  Radiology DG Chest Portable 1 View  Result Date: 09/16/2021 CLINICAL DATA:  Cough for 2 days EXAM: PORTABLE CHEST 1 VIEW COMPARISON:  12/22/2011 FINDINGS: The heart size and mediastinal contours are within normal limits. Atherosclerotic calcification of the aortic knob. No focal airspace consolidation, pleural effusion, or pneumothorax. For the visualized skeletal structures are unremarkable. IMPRESSION: No active disease. Electronically Signed   By: Davina Poke D.O.   On: 09/16/2021 08:53    Procedures Procedures   Medications Ordered in ED Medications  acetaminophen (TYLENOL) tablet 1,000 mg (1,000 mg Oral Given 09/16/21 [id number])    ED Course  I have reviewed the triage vital signs and the nursing notes.  Pertinent labs & imaging results that were available during my care of the patient were reviewed by me and considered in my medical decision making (see chart for details).    MDM Rules/Calculators/A&P                           55 year old female with medical history below to include HIV (last CD4 on 08/18/2021 873) [facility] with flulike symptoms.  Patient states that she has had a cough and nasal congestion with generalized body aches for the last 2 days.  A family member recently tested positive for the flu.  She denies any fevers or chills.  She is tolerating oral intake.  Her cough is nonproductive.  On arrival, the patient was mildly tachycardic P100, mildly hypertensive BP 173/98, afebrile, not tachypneic, saturating well on room air.  The patient presents with a flulike illness over the past few days in the setting of a positive sick contact testing positive for the flu.  A chest x-ray was performed which revealed  no  active disease.  An EKG revealed sinus tachycardia ventricular rate 101.  A D-dimer was negative.  Low suspicion for acute PE.  The patient appears mildly dehydrated but is tolerating oral intake at this time.     The patient's lungs are clear to auscultation bilaterally. Additionally, the patient has a soft/non-tender abdomen and no oropharyngeal exudates.  There are no signs of meningismus.  I see no signs of an acute bacterial infection.  The patient's presentation is most consistent with a viral upper respiratory infection.  I have a low suspicion for pneumonia as the patient's cough has been non-productive and the patient is neither tachypneic nor hypoxic on room air.  Additionally, the patient is CTAB.  IV access was attempted but was unsuccessful by nursing staff to include ultrasound attempts.  She tested positive for influenza A.  She has no leukocytosis or anemia.  Tylenol was administered for muscle aches.  As the patient is orally rehydrating at this time, I believe it is reasonable to continue to orally rehydrate outpatient at home.  A prescription for Tamiflu was sent to the patient's pharmacy.  I discussed symptomatic management, including hydration, motrin, and tylenol. The patient felt safe being discharged from the ED.  They agreed to followup with the PCP if needed.  I provided ED return precautions.  Final Clinical Impression(s) / ED Diagnoses Final diagnoses:  Influenza A    Rx / DC Orders ED Discharge Orders          Ordered    oseltamivir (TAMIFLU) 75 MG capsule  Every 12 hours,   Status:  Discontinued        09/16/21 0947    oseltamivir (TAMIFLU) 75 MG capsule  Every 12 hours        09/16/21 1147             Ernie Avena, MD 09/16/21 1154

## 2021-11-27 ENCOUNTER — Other Ambulatory Visit: Payer: Self-pay | Admitting: Infectious Disease

## 2021-12-01 ENCOUNTER — Encounter: Payer: Self-pay | Admitting: Infectious Disease

## 2021-12-01 DIAGNOSIS — Z7185 Encounter for immunization safety counseling: Secondary | ICD-10-CM

## 2021-12-01 HISTORY — DX: Encounter for immunization safety counseling: Z71.85

## 2021-12-01 NOTE — Progress Notes (Deleted)
Subjective:  \Chief complaint:  Patient ID: [name], female    DOB: 11-27-1965, 56 y.o.   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    N155H INSTI Accessory Mutations: None IN Other Mutations: S230N , D232E Integrase Strand Transfer Inhibitors  bictegravir (BIC) Potential Low-Level Resistance cabotegravir (CAB) Low-Level Resistance dolutegravir (DTG) Potential Low-Level Resistance elvitegravir (EVG) High-Level Resistance raltegravir (RAL) High-Level Resistance IN comments  Major N155H is a non-polymorphic mutation selected in patients receiving RAL, EVG, and rarely DTG. It is associated with high-level reductions in RAL and EVG susceptibility. It causes low-level reductions in DTG susceptibility. Other S230N is a polymorphism that is not associated with reduced INSTI susceptibility. This virus is predicted to have low-level reduced susceptibility to CAB. The use of the combination of CAB/RPV should be considered to be relatively contraindicated. Mutation scoring: IN HIVDB 9.1 (2021-04-02) Drug resistance mutation scores of INSTI: Rule BIC CAB DTG EVG RAL N155H 10 25 10  60 60   She has been typically well suppressed on SYMTUZA.  Certainly I could not find any record of conventional genotypic resistance which she must of surely had with her integrase failure.  Unfortunately she had a TIA rnd was [facility].  Her antiplatelet therapy was intensified with Plavix being added to aspirin.  Lipitor was increased though it did not appear that the providers took into account the drug drug interaction with SYMTUZA because she is on maximum dose Lipitor despite the booster of SYMTUZA  making atorvastatin levels even higher.  She has not experienced any evidence of myositis but clearly should be on a different dose of statin to account for the interaction with cobicistat.  Blood pressure was high today in the 150s over 90s I saw her in July.   At that time she was undergoing fair amount of stress due to the fact that her late son's murder is going to be released from jail the next month or 2 and this causes her some significant stress and depressive symptoms at times.  She was  on Plavix alone though neurology as noted indicated she should be on Plavix and aspirin for 3 months followed by aspirin alone.  She told me that she saw her primary care physician Dr. Jaynie CollinsLim though I cannot find any notes in care everywhere from Dr. Jaynie CollinsLim.  She says that Dr. Jaynie CollinsLim is managing her anti hypertensive medications.  Also has disability forms that she says were filled out partially by Dr. Jaynie CollinsLim.  She returns to clinic today.  When we saw her last her viral load was higher than it typically is in the 300s likely due to her having missed some SYMTUZA.  Blood pressure is worse today in clinic when the 160 systolic over 90s.  She is taking metoprolol but nothing else for her blood pressure at present.  She is a little bit down because was her birthday 3 days ago and she always misses her son on her birthday.  She says she was very frustrated with the clinic that she goes to her primary care because of the lack of checking the drug drug interaction between her statin and her SYMTUZA.  I do need her to have aggressive management of her  blood pressure and encouraged her to see primary care for optimization of blood pressure control.   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   Nonallopathic lesion of head 11/16/2017   Shingles    2000    Past Surgical History:  Procedure Laterality Date   ABDOMINAL HYSTERECTOMY  1993   Partial   CESAREAN SECTION     LAPAROSCOPIC ASSISTED VENTRAL HERNIA REPAIR N/A 01/02/2015   Procedure: LAPAROSCOPIC VENTRAL WALL HERNIA REPAIR;  Surgeon: Karie Soda, MD;  Location: [facility];  Service: General;  Laterality: N/A;  With [facility]   TONSILLECTOMY  1988    Family History  Problem Relation Age of Onset   Diabetes type II Brother    Migraines Neg Hx       Social History   Socioeconomic History   Marital status: Single    Spouse name: Not on file   Number of children: Not on file   Years of education: HS   Highest education level: Not on file  Occupational History   Occupation: disabled  Tobacco Use   Smoking status: Former    Types: Cigarettes   Smokeless tobacco: Never  Substance and Sexual Activity   Alcohol use: Yes    Comment: rare red wine    Drug use: Yes    Frequency: 14.0 times per week    Types: Marijuana   Sexual activity: Not Currently    Comment: given condoms.  Other Topics Concern   Not on file  Social History Narrative   Not on file   Social Determinants of Health   Financial Resource Strain: Not on file  Food Insecurity: Not on file  Transportation Needs: Not on file  Physical Activity: Not on file  Stress: Not on file  Social Connections: Not on file    Allergies  Allergen Reactions   Morphine Other (See Comments)    Burning under the skin   Doxycycline Hyclate Rash     Current Outpatient Medications:    aspirin EC 325 MG EC tablet, Take 1 tablet (325 mg total) by mouth daily.  (Patient not taking: No sig reported), Disp: 120 tablet, Rfl: 0   cloNIDine (CATAPRES) 0.1 MG tablet, Take 0.1 mg by mouth daily as needed (night sweats). (Patient not taking: No sig reported), Disp: , Rfl:    clopidogrel (PLAVIX) 75 MG tablet, Take 1 tablet (75 mg total) by mouth daily., Disp: 90 tablet, Rfl: 0   cyclobenzaprine (FLEXERIL) 10 MG tablet, Take 10 mg by mouth 3 (three) times daily as needed., Disp: , Rfl:    Darunavir-Cobicisctat-Emtricitabine-Tenofovir Alafenamide (SYMTUZA) 800-150-200-10 MG TABS, Take 1 tablet by mouth daily with breakfast., Disp: 30 tablet, Rfl: 11   gabapentin (NEURONTIN) 300 MG capsule, TAKE 1 CAPSULE BY MOUTH THREE TIMES A DAY (Patient taking differently: Take 300 mg by mouth 3 (three) times  daily as needed (nerve pain).), Disp: 270 capsule, Rfl: 1   hydrochlorothiazide (HYDRODIURIL) 25 MG tablet, TAKE 1 TABLET BY MOUTH EVERY DAY (Patient not taking: No sig reported), Disp: 90 tablet, Rfl: 0   metoprolol succinate (TOPROL-XL) 50 MG 24 hr tablet, TAKE 1 TABLET BY MOUTH EVERYDAY AT BEDTIME (Patient taking differently: Take 50 mg by mouth daily.), Disp: 90 tablet, Rfl: 1   nystatin (MYCOSTATIN/NYSTOP) powder, Apply 1 application topically daily as needed (redness)., Disp: , Rfl:    omeprazole (PRILOSEC) 20 MG capsule, TAKE 1 CAPSULE BY MOUTH TWICE A DAY, Disp: 180 capsule, Rfl: 3   oseltamivir (TAMIFLU) 75 MG capsule, Take 1 capsule (75 mg total) by mouth every 12 (twelve) hours., Disp: 10 capsule, Rfl: 0   rosuvastatin (CRESTOR) 20 MG tablet, Take 1 tablet (20 mg total) by mouth daily., Disp: 30 tablet, Rfl: 11   valACYclovir (VALTREX) 1000 MG tablet, Take 1 tablet (1,000 mg total) by mouth daily., Disp: 90 tablet, Rfl: 11   Review of Systems     Objective:   Physical Exam        Assessment & Plan:    HIV disease:  I am rechecking HIV VL , CD4, CMP, CBC c diff, RPR, gc and chlamydia  I will continue her Symtuza  prescription   Hypertension:  There were no vitals filed for this visit.  Depression, anxiety  Morbid obesity   Vaccine counseling:  I recommended bivalent boostert for COVID, flu shot, Prevnar 20.    Vaccine counseling she received her flu shot and her updated bivalent COVID vacccine

## 2021-12-02 ENCOUNTER — Ambulatory Visit: Payer: Medicare Other | Admitting: Infectious Disease

## 2021-12-02 DIAGNOSIS — B20 Human immunodeficiency virus [HIV] disease: Secondary | ICD-10-CM

## 2021-12-02 DIAGNOSIS — Z7185 Encounter for immunization safety counseling: Secondary | ICD-10-CM

## 2021-12-02 DIAGNOSIS — E785 Hyperlipidemia, unspecified: Secondary | ICD-10-CM

## 2021-12-02 DIAGNOSIS — I10 Essential (primary) hypertension: Secondary | ICD-10-CM

## 2021-12-02 NOTE — Telephone Encounter (Signed)
Okay to refill.  Thanks.

## 2021-12-17 ENCOUNTER — Other Ambulatory Visit: Payer: Self-pay | Admitting: Infectious Disease

## 2021-12-21 ENCOUNTER — Encounter (HOSPITAL_BASED_OUTPATIENT_CLINIC_OR_DEPARTMENT_OTHER): Payer: Self-pay | Admitting: *Deleted

## 2021-12-21 ENCOUNTER — Emergency Department (HOSPITAL_BASED_OUTPATIENT_CLINIC_OR_DEPARTMENT_OTHER): Payer: Medicare Other

## 2021-12-21 ENCOUNTER — Other Ambulatory Visit: Payer: Self-pay

## 2021-12-21 ENCOUNTER — Emergency Department (HOSPITAL_BASED_OUTPATIENT_CLINIC_OR_DEPARTMENT_OTHER)
Admission: EM | Admit: 2021-12-21 | Discharge: 2021-12-21 | Disposition: A | Discharge status: Home / Self Care | Payer: Medicare Other | Attending: Emergency Medicine | Admitting: Emergency Medicine

## 2021-12-21 DIAGNOSIS — Z7982 Long term (current) use of aspirin: Secondary | ICD-10-CM | POA: Insufficient documentation

## 2021-12-21 DIAGNOSIS — I10 Essential (primary) hypertension: Secondary | ICD-10-CM | POA: Diagnosis not present

## 2021-12-21 DIAGNOSIS — Z8673 Personal history of transient ischemic attack (TIA), and cerebral infarction without residual deficits: Secondary | ICD-10-CM | POA: Diagnosis not present

## 2021-12-21 DIAGNOSIS — Z79899 Other long term (current) drug therapy: Secondary | ICD-10-CM | POA: Diagnosis not present

## 2021-12-21 DIAGNOSIS — Z21 Asymptomatic human immunodeficiency virus [HIV] infection status: Secondary | ICD-10-CM | POA: Diagnosis not present

## 2021-12-21 DIAGNOSIS — Z7902 Long term (current) use of antithrombotics/antiplatelets: Secondary | ICD-10-CM | POA: Diagnosis not present

## 2021-12-21 DIAGNOSIS — I493 Ventricular premature depolarization: Secondary | ICD-10-CM

## 2021-12-21 DIAGNOSIS — R0789 Other chest pain: Secondary | ICD-10-CM

## 2021-12-21 DIAGNOSIS — R002 Palpitations: Secondary | ICD-10-CM

## 2021-12-21 LAB — CBC
HCT: 39.3 % (ref 36.0–46.0)
Hemoglobin: 12.7 g/dL (ref 12.0–15.0)
MCH: 28.2 pg (ref 26.0–34.0)
MCHC: 32.3 g/dL (ref 30.0–36.0)
MCV: 87.3 fL (ref 80.0–100.0)
Platelets: 455 10*3/uL — ABNORMAL HIGH (ref 150–400)
RBC: 4.5 MIL/uL (ref 3.87–5.11)
RDW: 13.7 % (ref 11.5–15.5)
WBC: 10.2 10*3/uL (ref 4.0–10.5)
nRBC: 0 % (ref 0.0–0.2)

## 2021-12-21 LAB — BASIC METABOLIC PANEL
Anion gap: 6 (ref 5–15)
BUN: 11 mg/dL (ref 6–20)
CO2: 32 mmol/L (ref 22–32)
Calcium: 9.4 mg/dL (ref 8.9–10.3)
Chloride: 102 mmol/L (ref 98–111)
Creatinine, Ser: 0.92 mg/dL (ref 0.44–1.00)
GFR, Estimated: >60 mL/min (ref >60)
Glucose, Bld: 119 mg/dL — ABNORMAL HIGH (ref 70–99)
Potassium: 3.7 mmol/L (ref 3.5–5.1)
Sodium: 140 mmol/L (ref 135–145)

## 2021-12-21 LAB — TROPONIN I (HIGH SENSITIVITY)
Troponin I (High Sensitivity): 2 ng/L (ref <18)
Troponin I (High Sensitivity): <2 ng/L (ref <18)

## 2021-12-21 LAB — PREGNANCY, URINE: Preg Test, Ur: NEGATIVE

## 2021-12-21 IMAGING — CR DG CHEST 2V
2 series · 2 of 2 positions shown · non-contrast
Comparison: [DATE]

CLINICAL DATA: Chest pain and tightness.

EXAM:
CHEST - 2 VIEW

[w chest pa]
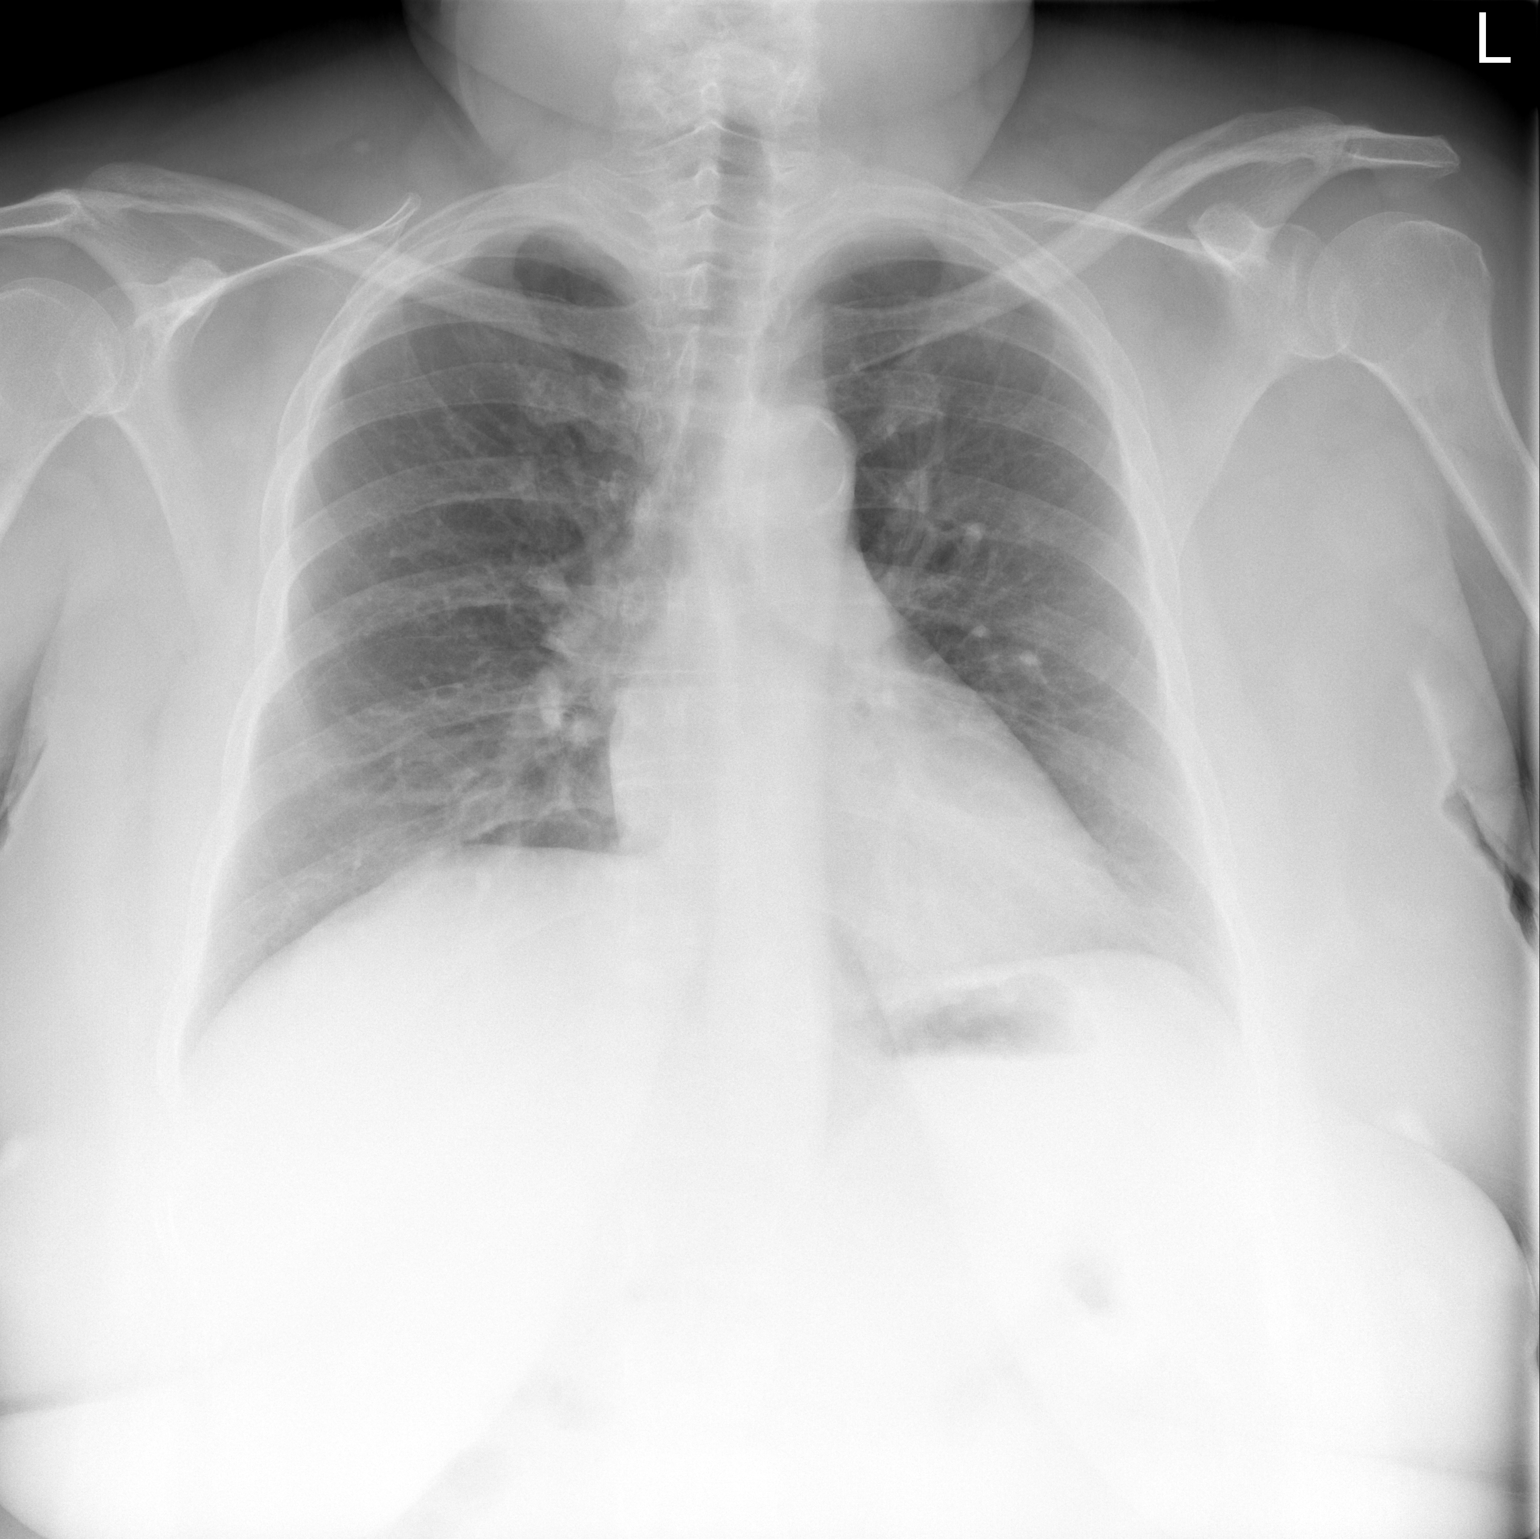

[w chest lat]
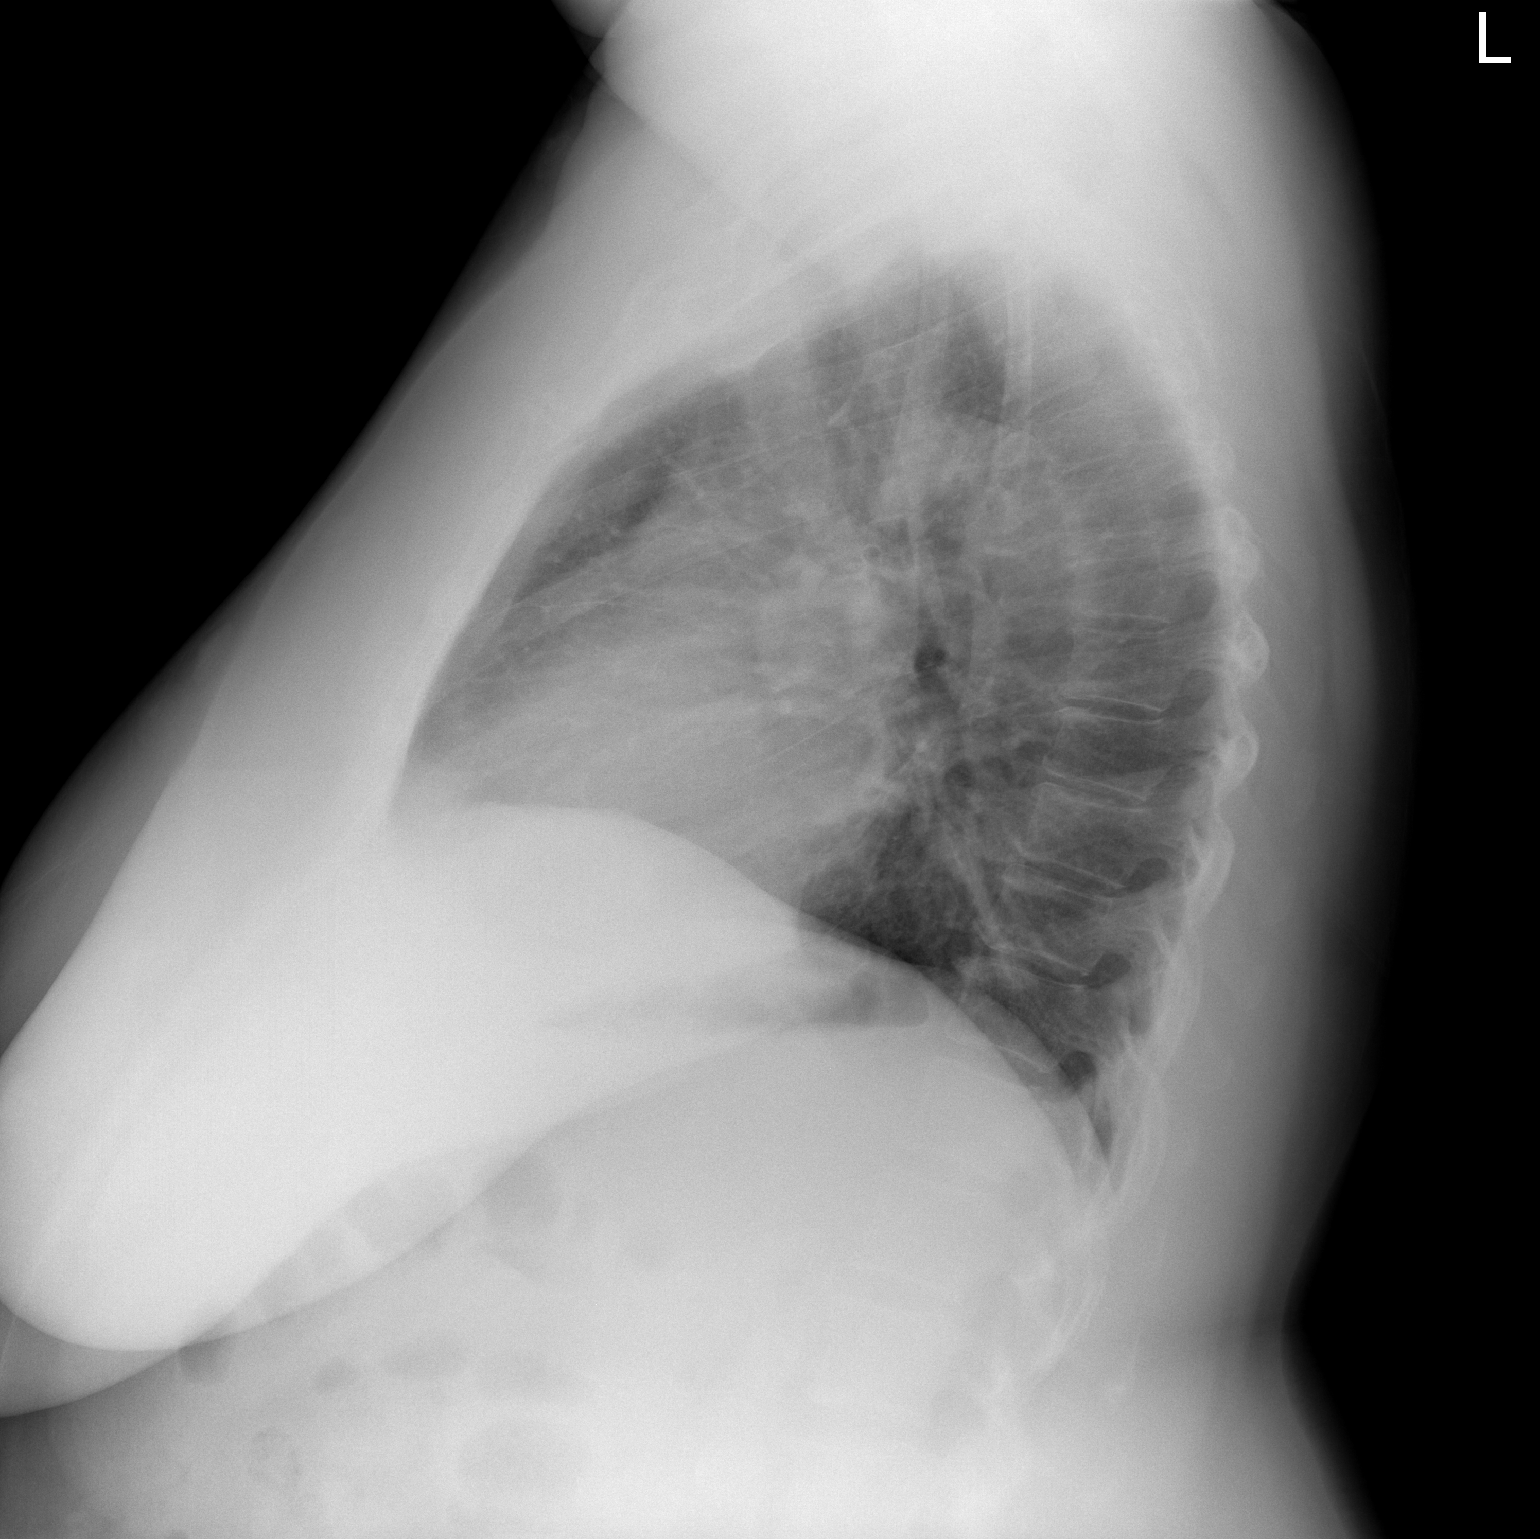

[2 of 2 positions shown; findings below may reference images not displayed]

FINDINGS: The heart size and mediastinal contours are within normal limits.
Both lungs are clear. The visualized skeletal structures are
unremarkable.
IMPRESSION: No active cardiopulmonary disease.

## 2021-12-21 NOTE — ED Provider Notes (Signed)
[facility] EMERGENCY DEPARTMENT  Provider Note  CSN: [id number] Arrival date & time: 12/21/21 1355  History Chief Complaint  Patient presents with   Chest Pain    [name] is a 56 y.o. female with history of HIV, TIA reports she had a 'heart attack' in 2005 but did not require a stent. She has been under a lot of stress recently. Reports while in her car around 10am today she had onset of tachy-palpitations and chest tightness radiating into her R arm. Symptoms improved after resting some but did not resolve and so she came to the ED for evaluation. She is currently pain free, still having occasional heart palpitations like skipping a beat. She is compliant with her HIV meds. Does not smoke cigarettes or drink. Occasional marijuana but no other drug use. She has HTN but not DM.    Home Medications Prior to Admission medications   Medication Sig Start Date End Date Taking? Authorizing Provider  aspirin EC 325 MG EC tablet Take 1 tablet (325 mg total) by mouth daily. Patient not taking: No sig reported 01/08/21   Merrilyn Puma, MD  cloNIDine (CATAPRES) 0.1 MG tablet Take 0.1 mg by mouth daily as needed (night sweats). Patient not taking: No sig reported 06/12/20   [provider]  clopidogrel (PLAVIX) 75 MG tablet Take 1 tablet (75 mg total) by mouth daily. 01/08/21   Merrilyn Puma, MD  cyclobenzaprine (FLEXERIL) 10 MG tablet Take 10 mg by mouth 3 (three) times daily as needed. 04/24/21   [provider]  Darunavir-Cobicisctat-Emtricitabine-Tenofovir Alafenamide (SYMTUZA) 800-150-200-10 MG TABS Take 1 tablet by mouth daily with breakfast. 05/13/21   Daiva Eves, Lisette Grinder, MD  gabapentin (NEURONTIN) 300 MG capsule TAKE 1 CAPSULE BY MOUTH THREE TIMES A DAY Patient taking differently: Take 300 mg by mouth 3 (three) times daily as needed (nerve pain). 09/03/20   Randall Hiss, MD  hydrochlorothiazide (HYDRODIURIL) 25 MG tablet TAKE 1 TABLET BY MOUTH EVERY  DAY Patient not taking: No sig reported 04/14/20   Daiva Eves, Lisette Grinder, MD  metoprolol succinate (TOPROL-XL) 50 MG 24 hr tablet TAKE 1 TABLET BY MOUTH EVERYDAY AT BEDTIME Patient taking differently: Take 50 mg by mouth daily. 06/02/20   Randall Hiss, MD  nystatin (MYCOSTATIN/NYSTOP) powder Apply 1 application topically daily as needed (redness). 11/07/20   [provider]  omeprazole (PRILOSEC) 20 MG capsule TAKE 1 CAPSULE BY MOUTH TWICE A DAY 03/02/21   Daiva Eves, Lisette Grinder, MD  oseltamivir (TAMIFLU) 75 MG capsule Take 1 capsule (75 mg total) by mouth every 12 (twelve) hours. 09/16/21   Ernie Avena, MD  rosuvastatin (CRESTOR) 20 MG tablet Take 1 tablet (20 mg total) by mouth daily. 05/13/21   Randall Hiss, MD  valACYclovir (VALTREX) 1000 MG tablet TAKE 1 TABLET (1,000 MG TOTAL) BY MOUTH DAILY. 12/03/21   Daiva Eves, Lisette Grinder, MD  pantoprazole sodium (PROTONIX) 40 mg/20 mL PACK Place 20 mLs (40 mg total) into feeding tube daily at 12 noon. 01/05/12 01/10/12  Randall Hiss, MD     Allergies    Morphine and Doxycycline hyclate   Review of Systems   Review of Systems Please see HPI for pertinent positives and negatives  Physical Exam BP (!) 174/100    Pulse 71    Temp 98.3 F (36.8 C) (Oral)    Resp 13    Ht 5\' 6"  (1.676 m)    Wt 105.2 kg  SpO2 97%    BMI 37.43 kg/m   Physical Exam Vitals and nursing note reviewed.  Constitutional:      Appearance: Normal appearance.  HENT:     Head: Normocephalic and atraumatic.     Nose: Nose normal.     Mouth/Throat:     Mouth: Mucous membranes are moist.  Eyes:     Extraocular Movements: Extraocular movements intact.     Conjunctiva/sclera: Conjunctivae normal.  Cardiovascular:     Rate and Rhythm: Normal rate.  Pulmonary:     Effort: Pulmonary effort is normal.     Breath sounds: Normal breath sounds.  Abdominal:     General: Abdomen is flat.     Palpations: Abdomen is soft.     Tenderness: There is no  abdominal tenderness.  Musculoskeletal:        General: No swelling. Normal range of motion.     Cervical back: Neck supple.  Skin:    General: Skin is warm and dry.  Neurological:     General: No focal deficit present.     Mental Status: She is alert.  Psychiatric:        Mood and Affect: Mood normal.    ED Results / Procedures / Treatments   EKG EKG Interpretation  Date/Time:  Monday December 21 2021 14:03:35 EST Ventricular Rate:  87 PR Interval:  166 QRS Duration: 68 QT Interval:  374 QTC Calculation: 450 R Axis:   44 Text Interpretation: Sinus rhythm with Premature atrial complexes Nonspecific ST abnormality Abnormal ECG When compared with ECG of 16-Sep-2021 08:42, Since last tracing st depression has improved Confirmed by Susy Frizzle [phone]) on 12/21/2021 4:02:28 PM  Procedures Procedures  Medications Ordered in the ED Medications - No data to display  Initial Impression and Plan  Patient with atypical chest pain during a period of tachy-palpitations earlier today, feeling better now. Occasional PVCs on the monitor now which she is feeling some of. EKG without acute ischemic changes (ST depression is improved from previous). Other labs done in triage: CBC is normal. BMP is normal. First Trop is neg. I personally viewed the images from radiology studies and agree with radiologist interpretation: CXR is clear   ED Course   Clinical Course as of 12/21/21 1741  Mon Dec 21, 2021  1628 Awaiting second troponin.  [CS]  1739 Repeat trop is negative. Patient remains well appearing, recommend she follow up with PCP. RTED if symptoms return. Given presenting complaint, I considered that admission might be necessary. After review of results from ED lab and/or imaging studies, admission to the hospital is not indicated at this time.   [CS]    Clinical Course User Index [CS] Pollyann Savoy, MD     MDM Rules/Calculators/A&P Medical Decision Making Problems  Addressed: Atypical chest pain: acute illness or injury that poses a threat to life or bodily functions Palpitations: acute illness or injury  Amount and/or Complexity of Data Reviewed Labs: ordered. Decision-making details documented in ED Course. Radiology: ordered and independent interpretation performed. Decision-making details documented in ED Course. ECG/medicine tests: ordered and independent interpretation performed. Decision-making details documented in ED Course.  Risk Decision regarding hospitalization.    Final Clinical Impression(s) / ED Diagnoses Final diagnoses:  Atypical chest pain  Palpitations  PVC (premature ventricular contraction)    Rx / DC Orders ED Discharge Orders     None        Pollyann Savoy, MD 12/21/21 [phone]

## 2021-12-21 NOTE — ED Triage Notes (Signed)
Chest tightness this am. Pain in her right arm. Light headed. She is tearful at triage.

## 2022-02-04 ENCOUNTER — Other Ambulatory Visit: Payer: Medicare Other

## 2022-02-15 ENCOUNTER — Other Ambulatory Visit: Payer: Self-pay | Admitting: Infectious Disease

## 2022-02-15 NOTE — Telephone Encounter (Signed)
Appt scheduled for 4/24.

## 2022-02-22 ENCOUNTER — Ambulatory Visit (INDEPENDENT_AMBULATORY_CARE_PROVIDER_SITE_OTHER): Payer: Medicare Other | Admitting: Infectious Disease

## 2022-02-22 ENCOUNTER — Encounter: Payer: Self-pay | Admitting: Infectious Disease

## 2022-02-22 ENCOUNTER — Other Ambulatory Visit: Payer: Self-pay

## 2022-02-22 VITALS — BP 172/109 | HR 84 | Temp 97.2°F | Wt 235.0 lb

## 2022-02-22 DIAGNOSIS — G629 Polyneuropathy, unspecified: Secondary | ICD-10-CM

## 2022-02-22 DIAGNOSIS — G459 Transient cerebral ischemic attack, unspecified: Secondary | ICD-10-CM | POA: Diagnosis not present

## 2022-02-22 DIAGNOSIS — B20 Human immunodeficiency virus [HIV] disease: Secondary | ICD-10-CM

## 2022-02-22 DIAGNOSIS — I10 Essential (primary) hypertension: Secondary | ICD-10-CM | POA: Diagnosis not present

## 2022-02-22 DIAGNOSIS — E785 Hyperlipidemia, unspecified: Secondary | ICD-10-CM

## 2022-02-22 DIAGNOSIS — F418 Other specified anxiety disorders: Secondary | ICD-10-CM

## 2022-02-22 MED ORDER — CLOPIDOGREL BISULFATE 75 MG PO TABS: 75.0000 mg | ORAL_TABLET | Freq: Every day | ORAL | 3 refills | Status: DC

## 2022-02-22 MED ORDER — SYMTUZA 800-150-200-10 MG PO TABS: 1.0000 | ORAL_TABLET | Freq: Every day | ORAL | 11 refills | Status: DC

## 2022-02-22 MED ORDER — GABAPENTIN 300 MG PO CAPS: ORAL_CAPSULE | ORAL | 4 refills | Status: DC

## 2022-02-22 MED ORDER — CYCLOBENZAPRINE HCL 10 MG PO TABS: 10.0000 mg | ORAL_TABLET | Freq: Three times a day (TID) | ORAL | 3 refills | Status: DC | PRN

## 2022-02-22 NOTE — Telephone Encounter (Signed)
Please advise 

## 2022-02-22 NOTE — Progress Notes (Signed)
? ?Subjective:  ?\Chief complaint: Follow-up for HIV disease ? ?Patient ID: [name], female    DOB: [birth year], 56 y.o.   MRN: [id number] ? ?HPI ? ?56 year old African American lady with HIV and AIDS who this  once again been able to perfectly suppress her viral load to <20 on PREZCOBIX and Truvada after having failed STRIBILD with with resistanceto integrase including raltegravir and EVG but not to DTG, N155H,S230N,D232E in 2016.  ? ? ?N155H ?INSTI Accessory Mutations: ?None ?IN Other Mutations: ?S230N ?, ?D232E ?Integrase Strand Transfer Inhibitors ? ?bictegravir (BIC) Potential Low-Level Resistance ?cabotegravir (CAB) Low-Level Resistance ?dolutegravir (DTG) Potential Low-Level Resistance ?elvitegravir (EVG) High-Level Resistance ?raltegravir (RAL) High-Level Resistance ?IN comments ? ?Major ?N155H is a non-polymorphic mutation selected in patients receiving RAL, EVG, and rarely DTG. It is associated with high-level reductions in RAL and EVG susceptibility. It causes low-level reductions in DTG susceptibility. ?Other ?S230N is a polymorphism that is not associated with reduced INSTI susceptibility. ?This virus is predicted to have low-level reduced susceptibility to CAB. The use of the combination of CAB/RPV should be considered to be relatively contraindicated. ?Mutation scoring: IN ?HIVDB 9.1 (2021-04-02) ?Drug resistance mutation scores of INSTI: ?Rule ?BIC ?CAB ?DTG ?EVG ?RAL ?N155H 10 25 10  60 60 ? ? ?She has been typically well suppressed on SYMTUZA.  Certainly I could not find any record of conventional genotypic resistance which she must of surely had with her integrase failure. ? ?Unfortunately she had a TIA rnd was hospitalized at [facility].  Her antiplatelet therapy was intensified with Plavix being added to aspirin.  Lipitor was increased though it did not appear that the providers took into account the drug drug interaction with SYMTUZA because she is on maximum dose Lipitor  despite the booster of SYMTUZA making atorvastatin levels even higher. ? ?[name]'s blood pressure was quite elevated again but she said she did not take her blood pressure medicines and she always needs to eat food if she takes her medicines because otherwise she says she becomes nauseous. ? ?She wants to get established with a different primary care physician in [place] rather than 1 she was seeing in [facility] so I will make a referral for her to 1 here locally. ? ? ? ? ? ?Past Medical History:  ?Diagnosis Date  ? Anemia   ? Depression with anxiety 05/13/2021  ? GERD (gastroesophageal reflux disease)   ? Grieving 05/13/2021  ? Headache   ? hx of migraines   ? History of cocaine abuse (HCC)   ? HIV disease (HCC)   ? HIV infection (HCC)   ? Hyperlipidemia 08/18/2021  ? Hypertension   ? not on medication   ? Incarcerated incisional hernia s/p lap VWH repair 01/02/2015 12/18/2014  ? Muscle spasm 05/11/2017  ? Myocardial infarction [facility])   ? mild MI 09/2004     no heart problems since  ? Neck swelling 11/16/2017  ? 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 Nonallopathic lesion of head 11/16/2017  ? Shingles   ? 2000  ? Vaccine counseling 12/01/2021  ? ? ?Past Surgical History:  ?Procedure Laterality Date  ? ABDOMINAL HYSTERECTOMY  1993  ? Partial  ? CESAREAN SECTION    ? LAPAROSCOPIC ASSISTED VENTRAL HERNIA REPAIR N/A 01/02/2015  ? Procedure: LAPAROSCOPIC VENTRAL WALL HERNIA REPAIR;  Surgeon: Karie Soda, MD;  Location: [facility];  Service: General;  Laterality: N/A;  With MESH  ? TONSILLECTOMY  1988  ? ? ?Family History  ?Problem  Relation Age of Onset  ? Diabetes type II Brother   ? Migraines Neg Hx   ? ? ?  ?Social History  ? ?Socioeconomic History  ? Marital status: Single  ?  Spouse name: Not on file  ? Number of children: Not on file  ? Years of education: HS  ? Highest education level: Not on file  ?Occupational History  ? Occupation: disabled  ?Tobacco Use  ? Smoking status: Former  ?  Types: Cigarettes  ? Smokeless  tobacco: Never  ?Vaping Use  ? Vaping Use: Never used  ?Substance and Sexual Activity  ? Alcohol use: Yes  ?  Comment: rare red wine   ? Drug use: Yes  ?  Frequency: 14.0 times per week  ?  Types: Marijuana  ? Sexual activity: Not Currently  ?  Comment: given condoms.  ?Other Topics Concern  ? Not on file  ?Social History Narrative  ? Not on file  ? ?Social Determinants of Health  ? ?Financial Resource Strain: Not on file  ?Food Insecurity: Not on file  ?Transportation Needs: Not on file  ?Physical Activity: Not on file  ?Stress: Not on file  ?Social Connections: Not on file  ? ? ?Allergies  ?Allergen Reactions  ? Morphine Other (See Comments)  ?  Burning under the skin  ? Doxycycline Hyclate Rash  ? ? ? ?Current Outpatient Medications:  ?  aspirin EC 325 MG EC tablet, Take 1 tablet (325 mg total) by mouth daily. (Patient not taking: No sig reported), Disp: 120 tablet, Rfl: 0 ?  cloNIDine (CATAPRES) 0.1 MG tablet, Take 0.1 mg by mouth daily as needed (night sweats). (Patient not taking: No sig reported), Disp: , Rfl:  ?  clopidogrel (PLAVIX) 75 MG tablet, Take 1 tablet (75 mg total) by mouth daily., Disp: 90 tablet, Rfl: 0 ?  cyclobenzaprine (FLEXERIL) 10 MG tablet, Take 10 mg by mouth 3 (three) times daily as needed., Disp: , Rfl:  ?  Darunavir-Cobicisctat-Emtricitabine-Tenofovir Alafenamide (SYMTUZA) 800-150-200-10 MG TABS, Take 1 tablet by mouth daily with breakfast., Disp: 30 tablet, Rfl: 11 ?  gabapentin (NEURONTIN) 300 MG capsule, TAKE 1 CAPSULE BY MOUTH THREE TIMES A DAY (Patient taking differently: Take 300 mg by mouth 3 (three) times daily as needed (nerve pain).), Disp: 270 capsule, Rfl: 1 ?  hydrochlorothiazide (HYDRODIURIL) 25 MG tablet, TAKE 1 TABLET BY MOUTH EVERY DAY (Patient not taking: No sig reported), Disp: 90 tablet, Rfl: 0 ?  metoprolol succinate (TOPROL-XL) 50 MG 24 hr tablet, TAKE 1 TABLET BY MOUTH EVERYDAY AT BEDTIME (Patient taking differently: Take 50 mg by mouth daily.), Disp: 90 tablet,  Rfl: 1 ?  nystatin (MYCOSTATIN/NYSTOP) powder, Apply 1 application topically daily as needed (redness)., Disp: , Rfl:  ?  omeprazole (PRILOSEC) 20 MG capsule, TAKE 1 CAPSULE BY MOUTH TWICE A DAY, Disp: 180 capsule, Rfl: 3 ?  oseltamivir (TAMIFLU) 75 MG capsule, Take 1 capsule (75 mg total) by mouth every 12 (twelve) hours., Disp: 10 capsule, Rfl: 0 ?  rosuvastatin (CRESTOR) 20 MG tablet, Take 1 tablet (20 mg total) by mouth daily., Disp: 30 tablet, Rfl: 11 ?  valACYclovir (VALTREX) 1000 MG tablet, TAKE 1 TABLET (1,000 MG TOTAL) BY MOUTH DAILY., Disp: 30 tablet, Rfl: 0 ? ? ?Review of Systems  ?Constitutional:  Negative for activity change, appetite change, chills, diaphoresis, fatigue, fever and unexpected weight change.  ?HENT:  Negative for congestion, rhinorrhea, sinus pressure, sneezing, sore throat and trouble swallowing.   ?Eyes:  Negative for  photophobia and visual disturbance.  ?Respiratory:  Negative for cough, chest tightness, shortness of breath, wheezing and stridor.   ?Cardiovascular:  Negative for chest pain, palpitations and leg swelling.  ?Gastrointestinal:  Negative for abdominal distention, abdominal pain, anal bleeding, blood in stool, constipation, diarrhea, nausea and vomiting.  ?Genitourinary:  Negative for difficulty urinating, dysuria, flank pain and hematuria.  ?Musculoskeletal:  Negative for arthralgias, back pain, gait problem, joint swelling and myalgias.  ?Skin:  Negative for color change, pallor, rash and wound.  ?Neurological:  Negative for dizziness, tremors, weakness and light-headedness.  ?Hematological:  Negative for adenopathy. Does not bruise/bleed easily.  ?Psychiatric/Behavioral:  Negative for agitation, behavioral problems, confusion, decreased concentration, dysphoric mood and sleep disturbance.   ? ?   ?Objective:  ? Physical Exam ?Constitutional:   ?   General: She is not in acute distress. ?   Appearance: Normal appearance. She is well-developed. She is not ill-appearing or  diaphoretic.  ?HENT:  ?   Head: Normocephalic and atraumatic.  ?   Right Ear: Hearing and external ear normal.  ?   Left Ear: Hearing and external ear normal.  ?   Nose: No nasal deformity or rhinorrhea.  ?Eyes:

## 2022-02-23 LAB — CBC WITH DIFFERENTIAL/PLATELET
Absolute Monocytes: 539 cells/uL (ref 200–950)
Basophils Absolute: 78 cells/uL (ref 0–200)
Basophils Relative: 0.9 %
Eosinophils Absolute: 670 cells/uL — ABNORMAL HIGH (ref 15–500)
Eosinophils Relative: 7.7 %
HCT: 40 % (ref 35.0–45.0)
Hemoglobin: 13.3 g/dL (ref 11.7–15.5)
Lymphs Abs: 3271 cells/uL (ref 850–3900)
MCH: 29.2 pg (ref 27.0–33.0)
MCHC: 33.3 g/dL (ref 32.0–36.0)
MCV: 87.9 fL (ref 80.0–100.0)
MPV: 10.3 fL (ref 7.5–12.5)
Monocytes Relative: 6.2 %
Neutro Abs: 4141 cells/uL (ref 1500–7800)
Neutrophils Relative %: 47.6 %
Platelets: 450 10*3/uL — ABNORMAL HIGH (ref 140–400)
RBC: 4.55 10*6/uL (ref 3.80–5.10)
RDW: 13.9 % (ref 11.0–15.0)
Total Lymphocyte: 37.6 %
WBC: 8.7 10*3/uL (ref 3.8–10.8)

## 2022-02-23 LAB — HEMOGLOBIN A1C
Hgb A1c MFr Bld: 6.2 % of total Hgb — ABNORMAL HIGH (ref <5.7)
Mean Plasma Glucose: 131 mg/dL
eAG (mmol/L): 7.3 mmol/L

## 2022-02-23 LAB — LIPID PANEL
Cholesterol: 197 mg/dL (ref <200)
HDL: 47 mg/dL — ABNORMAL LOW (ref >=50)
LDL Cholesterol (Calc): 126 mg/dL (calc) — ABNORMAL HIGH
Non-HDL Cholesterol (Calc): 150 mg/dL (calc) — ABNORMAL HIGH (ref <130)
Total CHOL/HDL Ratio: 4.2 (calc) (ref <5.0)
Triglycerides: 126 mg/dL (ref <150)

## 2022-02-23 LAB — COMPLETE METABOLIC PANEL WITH GFR
AG Ratio: 1.5 (calc) (ref 1.0–2.5)
ALT: 35 U/L — ABNORMAL HIGH (ref 6–29)
AST: 22 U/L (ref 10–35)
Albumin: 4.6 g/dL (ref 3.6–5.1)
Alkaline phosphatase (APISO): 114 U/L (ref 37–153)
BUN: 13 mg/dL (ref 7–25)
CO2: 31 mmol/L (ref 20–32)
Calcium: 10 mg/dL (ref 8.6–10.4)
Chloride: 101 mmol/L (ref 98–110)
Creat: 1.01 mg/dL (ref 0.50–1.03)
Globulin: 3 g/dL (calc) (ref 1.9–3.7)
Glucose, Bld: 99 mg/dL (ref 65–99)
Potassium: 4.1 mmol/L (ref 3.5–5.3)
Sodium: 140 mmol/L (ref 135–146)
Total Bilirubin: 0.3 mg/dL (ref 0.2–1.2)
Total Protein: 7.6 g/dL (ref 6.1–8.1)
eGFR: 65 mL/min/{1.73_m2} (ref >=60)

## 2022-02-23 LAB — LDL CHOLESTEROL, DIRECT: Direct LDL: 130 mg/dL — ABNORMAL HIGH (ref <100)

## 2022-02-23 LAB — RPR: RPR Ser Ql: NONREACTIVE

## 2022-02-24 LAB — HELPER T-LYMPH-CD4 (ARMC ONLY)
% CD 4 Pos. Lymph.: 24.9 % — ABNORMAL LOW (ref 30.8–58.5)
Absolute CD 4 Helper: 822 /uL (ref 359–1519)
Basophils Absolute: 0.1 10*3/uL (ref 0.0–0.2)
Basos: 1 %
EOS (ABSOLUTE): 0.6 10*3/uL — ABNORMAL HIGH (ref 0.0–0.4)
Eos: 7 %
Hematocrit: 41.6 % (ref 34.0–46.6)
Hemoglobin: 13 g/dL (ref 11.1–15.9)
Immature Grans (Abs): 0 10*3/uL (ref 0.0–0.1)
Immature Granulocytes: 0 %
Lymphocytes Absolute: 3.3 10*3/uL — ABNORMAL HIGH (ref 0.7–3.1)
Lymphs: 39 %
MCH: 27.7 pg (ref 26.6–33.0)
MCHC: 31.3 g/dL — ABNORMAL LOW (ref 31.5–35.7)
MCV: 89 fL (ref 79–97)
Monocytes Absolute: 0.5 10*3/uL (ref 0.1–0.9)
Monocytes: 6 %
Neutrophils Absolute: 4 10*3/uL (ref 1.4–7.0)
Neutrophils: 47 %
Platelets: 486 10*3/uL — ABNORMAL HIGH (ref 150–450)
RBC: 4.69 x10E6/uL (ref 3.77–5.28)
RDW: 13.5 % (ref 11.7–15.4)
WBC: 8.6 10*3/uL (ref 3.4–10.8)

## 2022-02-25 LAB — HIV RNA, RTPCR W/R GT (RTI, PI,INT)
HIV 1 RNA Quant: 29 copies/mL — ABNORMAL HIGH
HIV-1 RNA Quant, Log: 1.46 Log copies/mL — ABNORMAL HIGH

## 2022-02-26 LAB — T-HELPER CELLS (CD4) COUNT (NOT AT ARMC)

## 2022-04-27 ENCOUNTER — Ambulatory Visit: Payer: Medicare Other | Admitting: Infectious Disease

## 2022-04-28 ENCOUNTER — Ambulatory Visit: Payer: Medicare Other | Admitting: Infectious Disease

## 2022-06-07 ENCOUNTER — Other Ambulatory Visit: Payer: Self-pay | Admitting: Infectious Disease

## 2022-06-07 DIAGNOSIS — K219 Gastro-esophageal reflux disease without esophagitis: Secondary | ICD-10-CM

## 2022-07-12 ENCOUNTER — Ambulatory Visit (INDEPENDENT_AMBULATORY_CARE_PROVIDER_SITE_OTHER): Payer: Medicare Other | Admitting: Infectious Disease

## 2022-07-12 ENCOUNTER — Encounter: Payer: Self-pay | Admitting: Infectious Disease

## 2022-07-12 ENCOUNTER — Other Ambulatory Visit (HOSPITAL_COMMUNITY)
Admission: RE | Admit: 2022-07-12 | Discharge: 2022-07-12 | Disposition: A | Discharge status: Home / Self Care | Payer: Medicare Other | Source: Ambulatory Visit | Attending: Infectious Disease | Admitting: Infectious Disease

## 2022-07-12 ENCOUNTER — Other Ambulatory Visit: Payer: Self-pay

## 2022-07-12 VITALS — BP 146/87 | HR 73 | Temp 98.0°F | Ht 65.0 in | Wt 232.0 lb

## 2022-07-12 DIAGNOSIS — I10 Essential (primary) hypertension: Secondary | ICD-10-CM

## 2022-07-12 DIAGNOSIS — G459 Transient cerebral ischemic attack, unspecified: Secondary | ICD-10-CM

## 2022-07-12 DIAGNOSIS — B20 Human immunodeficiency virus [HIV] disease: Secondary | ICD-10-CM | POA: Insufficient documentation

## 2022-07-12 DIAGNOSIS — E7849 Other hyperlipidemia: Secondary | ICD-10-CM | POA: Diagnosis not present

## 2022-07-12 DIAGNOSIS — M791 Myalgia, unspecified site: Secondary | ICD-10-CM

## 2022-07-12 DIAGNOSIS — E782 Mixed hyperlipidemia: Secondary | ICD-10-CM

## 2022-07-12 MED ORDER — ROSUVASTATIN CALCIUM 20 MG PO TABS: 20.0000 mg | ORAL_TABLET | Freq: Every day | ORAL | 11 refills | Status: DC

## 2022-07-12 MED ORDER — METOPROLOL SUCCINATE ER 50 MG PO TB24: 50.0000 mg | ORAL_TABLET | Freq: Every day | ORAL | 4 refills | Status: DC

## 2022-07-12 MED ORDER — CLOPIDOGREL BISULFATE 75 MG PO TABS: 75.0000 mg | ORAL_TABLET | Freq: Every day | ORAL | 3 refills | Status: DC

## 2022-07-12 MED ORDER — CYCLOBENZAPRINE HCL 10 MG PO TABS: 10.0000 mg | ORAL_TABLET | Freq: Three times a day (TID) | ORAL | 3 refills | Status: DC | PRN

## 2022-07-12 MED ORDER — VALACYCLOVIR HCL 1 G PO TABS: 1000.0000 mg | ORAL_TABLET | Freq: Every day | ORAL | 11 refills | Status: DC

## 2022-07-12 MED ORDER — SYMTUZA 800-150-200-10 MG PO TABS: 1.0000 | ORAL_TABLET | Freq: Every day | ORAL | 11 refills | Status: DC

## 2022-07-12 NOTE — Progress Notes (Signed)
Subjective:  Chief complaint follow-up for HIV disease on medications also complaining of muscle spasms  Patient ID: [name], female    DOB: 11/13/1965, 56 y.o.   MRN: [id number]  HPI  [name] is a 56 year old woman living with HIV that has been relatively well controlled history of hypertension grief related to the loss of her son was murdered, musculoskeletal pain gastroesophageal reflux disease.    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   Nonallopathic lesion of head 11/16/2017   Shingles    2000   Vaccine counseling 12/01/2021    Past Surgical History:  Procedure Laterality Date   ABDOMINAL HYSTERECTOMY  1993   Partial   CESAREAN SECTION     LAPAROSCOPIC ASSISTED VENTRAL HERNIA REPAIR N/A 01/02/2015   Procedure: LAPAROSCOPIC VENTRAL WALL HERNIA REPAIR;  Surgeon: Karie Soda, MD;  Location: [facility];  Service: General;  Laterality: N/A;  With MESH   TONSILLECTOMY  1988    Family History  Problem Relation Age of Onset   Diabetes type II Brother    Migraines Neg Hx       Social History   Socioeconomic History   Marital status: Single    Spouse name: Not on file   Number of children: Not on file   Years of education: HS   Highest education level: Not on file  Occupational History   Occupation: disabled  Tobacco Use   Smoking status: Former    Types: Cigarettes   Smokeless tobacco: Never  Vaping Use   Vaping Use: Never used   Substance and Sexual Activity   Alcohol use: Yes    Comment: occasional   Drug use: Yes    Frequency: 14.0 times per week    Types: Marijuana    Comment: daily   Sexual activity: Not Currently    Comment: given condoms  Other Topics Concern   Not on file  Social History Narrative   Not on file   Social Determinants of Health   Financial Resource Strain: Not on file  Food Insecurity: Not on file  Transportation Needs: Not on file  Physical Activity: Not on file  Stress: Not on file  Social Connections: Not on file    Allergies  Allergen Reactions   Morphine Other (See Comments)    Burning under the skin   Doxycycline Hyclate Rash     Current Outpatient Medications:    gabapentin (NEURONTIN) 300 MG capsule, TAKE 1 CAPSULE BY MOUTH THREE TIMES A DAY, Disp: 270 capsule, Rfl: 4   metoprolol succinate (TOPROL XL) 50 MG 24 hr tablet, Take 1 tablet (50 mg total) by mouth daily. Take with or immediately following a meal., Disp: 90 tablet, Rfl: 4   clopidogrel (PLAVIX) 75 MG tablet, Take 1 tablet (75 mg total) by mouth daily., Disp: 90 tablet, Rfl: 3   cyclobenzaprine (  FLEXERIL) 10 MG tablet, Take 1 tablet (10 mg total) by mouth 3 (three) times daily as needed., Disp: 90 tablet, Rfl: 3   Darunavir-Cobicistat-Emtricitabine-Tenofovir Alafenamide (SYMTUZA) 800-150-200-10 MG TABS, Take 1 tablet by mouth daily with breakfast., Disp: 30 tablet, Rfl: 11   rosuvastatin (CRESTOR) 20 MG tablet, Take 1 tablet (20 mg total) by mouth daily., Disp: 30 tablet, Rfl: 11   valACYclovir (VALTREX) 1000 MG tablet, Take 1 tablet (1,000 mg total) by mouth daily., Disp: 30 tablet, Rfl: 11   Review of Systems  Constitutional:  Negative for activity change, appetite change, chills, diaphoresis, fatigue, fever and unexpected weight change.  HENT:  Negative for congestion, rhinorrhea, sinus pressure, sneezing, sore throat and trouble swallowing.   Eyes:  Negative for photophobia and visual disturbance.   Respiratory:  Negative for cough, chest tightness, shortness of breath, wheezing and stridor.   Cardiovascular:  Negative for chest pain, palpitations and leg swelling.  Gastrointestinal:  Negative for abdominal distention, abdominal pain, anal bleeding, blood in stool, constipation, diarrhea, nausea and vomiting.  Genitourinary:  Negative for difficulty urinating, dysuria, flank pain and hematuria.  Musculoskeletal:  Negative for arthralgias, back pain, gait problem, joint swelling and myalgias.  Skin:  Negative for color change, pallor, rash and wound.  Neurological:  Negative for dizziness, tremors, weakness and light-headedness.  Hematological:  Negative for adenopathy. Does not bruise/bleed easily.  Psychiatric/Behavioral:  Negative for agitation, behavioral problems, confusion, decreased concentration, dysphoric mood and sleep disturbance.        Objective:   Physical Exam Constitutional:      General: She is not in acute distress.    Appearance: Normal appearance. She is well-developed. She is not ill-appearing or diaphoretic.  HENT:     Head: Normocephalic and atraumatic.     Right Ear: Hearing and external ear normal.     Left Ear: Hearing and external ear normal.     Nose: No nasal deformity or rhinorrhea.  Eyes:     General: No scleral icterus.    Conjunctiva/sclera: Conjunctivae normal.     Right eye: Right conjunctiva is not injected.     Left eye: Left conjunctiva is not injected.     Pupils: Pupils are equal, round, and reactive to light.  Neck:     Vascular: No JVD.  Cardiovascular:     Rate and Rhythm: Normal rate and regular rhythm.     Heart sounds: S1 normal and S2 normal.  Pulmonary:     Effort: Pulmonary effort is normal. No respiratory distress.     Breath sounds: No wheezing.  Abdominal:     General: There is no distension.     Palpations: Abdomen is soft.  Musculoskeletal:        General: Normal range of motion.     Right shoulder: Normal.     Left  shoulder: Normal.     Cervical back: Normal range of motion and neck supple.     Right hip: Normal.     Left hip: Normal.     Right knee: Normal.     Left knee: Normal.  Lymphadenopathy:     Head:     Right side of head: No submandibular, preauricular or posterior auricular adenopathy.     Left side of head: No submandibular, preauricular or posterior auricular adenopathy.     Cervical: No cervical adenopathy.     Right cervical: No superficial or deep cervical adenopathy.    Left cervical: No superficial or deep cervical adenopathy.  Skin:  General: Skin is warm and dry.     Coloration: Skin is not pale.     Findings: No abrasion, bruising, ecchymosis, erythema, lesion or rash.     Nails: There is no clubbing.  Neurological:     General: No focal deficit present.     Mental Status: She is alert and oriented to person, place, and time.     Sensory: No sensory deficit.     Coordination: Coordination normal.     Gait: Gait normal.  Psychiatric:        Attention and Perception: She is attentive.        Mood and Affect: Mood normal.        Speech: Speech normal.        Behavior: Behavior normal. Behavior is cooperative.        Thought Content: Thought content normal.        Judgment: Judgment normal.           Assessment & Plan:   HIV disease: she had failed STRibild with INSTI R so really needs to be on a boosted PI  I will add order HIV viral load CD4 count CBC with differential CMP, RPR GC and chlamydia and I will continue  [name]'s SYMTUZA prescription  Hypertension: Is only taking metoprolol but I have refilled this for her.  Of TIA we will continue her Plavix as well as Toprol and her atorvastatin.    Hyperlipidemia continue atorvastatin   Myalgias we will continue her Flexeril.

## 2022-07-13 LAB — T-HELPER CELLS (CD4) COUNT (NOT AT ARMC)
CD4 % Helper T Cell: 26 % — ABNORMAL LOW (ref 33–65)
CD4 T Cell Abs: 1014 /uL (ref 400–1790)

## 2022-07-13 LAB — URINE CYTOLOGY ANCILLARY ONLY
Chlamydia: NEGATIVE
Comment: NEGATIVE
Comment: NORMAL
Neisseria Gonorrhea: NEGATIVE

## 2022-07-14 ENCOUNTER — Other Ambulatory Visit: Payer: Self-pay

## 2022-07-14 ENCOUNTER — Telehealth: Payer: Self-pay

## 2022-07-14 DIAGNOSIS — K219 Gastro-esophageal reflux disease without esophagitis: Secondary | ICD-10-CM

## 2022-07-14 NOTE — Telephone Encounter (Signed)
Patient called and requested prescription for omeprazole; stated she needed a "refill". Per chart review, patient previously on omeprazole 20 mg twice daily. Pharmacy is [facility] on [address] in [place].  Request routed to provider.  Wyvonne Lenz, RN

## 2022-07-15 LAB — COMPLETE METABOLIC PANEL WITH GFR
AG Ratio: 1.5 (calc) (ref 1.0–2.5)
ALT: 27 U/L (ref 6–29)
AST: 21 U/L (ref 10–35)
Albumin: 4.7 g/dL (ref 3.6–5.1)
Alkaline phosphatase (APISO): 118 U/L (ref 37–153)
BUN/Creatinine Ratio: 11 (calc) (ref 6–22)
BUN: 12 mg/dL (ref 7–25)
CO2: 27 mmol/L (ref 20–32)
Calcium: 10.1 mg/dL (ref 8.6–10.4)
Chloride: 104 mmol/L (ref 98–110)
Creat: 1.09 mg/dL — ABNORMAL HIGH (ref 0.50–1.03)
Globulin: 3.1 g/dL (calc) (ref 1.9–3.7)
Glucose, Bld: 90 mg/dL (ref 65–99)
Potassium: 4.3 mmol/L (ref 3.5–5.3)
Sodium: 141 mmol/L (ref 135–146)
Total Bilirubin: 0.3 mg/dL (ref 0.2–1.2)
Total Protein: 7.8 g/dL (ref 6.1–8.1)
eGFR: 60 mL/min/{1.73_m2} (ref >=60)

## 2022-07-15 LAB — CBC WITH DIFFERENTIAL/PLATELET
Absolute Monocytes: 588 cells/uL (ref 200–950)
Basophils Absolute: 78 cells/uL (ref 0–200)
Basophils Relative: 0.8 %
Eosinophils Absolute: 519 cells/uL — ABNORMAL HIGH (ref 15–500)
Eosinophils Relative: 5.3 %
HCT: 40.7 % (ref 35.0–45.0)
Hemoglobin: 13.4 g/dL (ref 11.7–15.5)
Lymphs Abs: 4341 cells/uL — ABNORMAL HIGH (ref 850–3900)
MCH: 28 pg (ref 27.0–33.0)
MCHC: 32.9 g/dL (ref 32.0–36.0)
MCV: 85.1 fL (ref 80.0–100.0)
MPV: 10 fL (ref 7.5–12.5)
Monocytes Relative: 6 %
Neutro Abs: 4273 cells/uL (ref 1500–7800)
Neutrophils Relative %: 43.6 %
Platelets: 502 10*3/uL — ABNORMAL HIGH (ref 140–400)
RBC: 4.78 10*6/uL (ref 3.80–5.10)
RDW: 13.5 % (ref 11.0–15.0)
Total Lymphocyte: 44.3 %
WBC: 9.8 10*3/uL (ref 3.8–10.8)

## 2022-07-15 LAB — LIPID PANEL
Cholesterol: 262 mg/dL — ABNORMAL HIGH (ref <200)
HDL: 40 mg/dL — ABNORMAL LOW (ref >=50)
LDL Cholesterol (Calc): 178 mg/dL (calc) — ABNORMAL HIGH
Non-HDL Cholesterol (Calc): 222 mg/dL (calc) — ABNORMAL HIGH (ref <130)
Total CHOL/HDL Ratio: 6.6 (calc) — ABNORMAL HIGH (ref <5.0)
Triglycerides: 249 mg/dL — ABNORMAL HIGH (ref <150)

## 2022-07-15 LAB — HIV-1 RNA ULTRAQUANT REFLEX TO GENTYP+
HIV 1 RNA Quant: NOT DETECTED copies/mL
HIV-1 RNA Quant, Log: NOT DETECTED Log copies/mL

## 2022-07-15 LAB — RPR: RPR Ser Ql: NONREACTIVE

## 2022-07-15 LAB — LDL CHOLESTEROL, DIRECT: Direct LDL: 198 mg/dL — ABNORMAL HIGH (ref <100)

## 2022-07-15 MED ORDER — OMEPRAZOLE 20 MG PO CPDR: 20.0000 mg | DELAYED_RELEASE_CAPSULE | Freq: Every day | ORAL | 2 refills | Status: DC

## 2022-07-15 NOTE — Telephone Encounter (Signed)
Looks like Dr. Daiva Eves already recommended omeprazole 20mg  once daily - thanks

## 2022-07-15 NOTE — Addendum Note (Signed)
Addended by: Valarie Cones on: 07/15/2022 10:12 AM   Modules accepted: Orders

## 2022-09-13 ENCOUNTER — Other Ambulatory Visit (HOSPITAL_COMMUNITY)
Admission: RE | Admit: 2022-09-13 | Discharge: 2022-09-13 | Disposition: A | Discharge status: Home / Self Care | Payer: Medicare Other | Source: Ambulatory Visit | Attending: Infectious Disease | Admitting: Infectious Disease

## 2022-09-13 ENCOUNTER — Ambulatory Visit (INDEPENDENT_AMBULATORY_CARE_PROVIDER_SITE_OTHER): Payer: Medicare Other | Admitting: Infectious Disease

## 2022-09-13 ENCOUNTER — Ambulatory Visit (INDEPENDENT_AMBULATORY_CARE_PROVIDER_SITE_OTHER): Payer: Medicare Other

## 2022-09-13 ENCOUNTER — Other Ambulatory Visit: Payer: Self-pay

## 2022-09-13 ENCOUNTER — Ambulatory Visit: Payer: Medicare Other

## 2022-09-13 VITALS — BP 155/98 | HR 91 | Temp 98.1°F | Wt 235.0 lb

## 2022-09-13 DIAGNOSIS — R002 Palpitations: Secondary | ICD-10-CM

## 2022-09-13 DIAGNOSIS — Z7185 Encounter for immunization safety counseling: Secondary | ICD-10-CM

## 2022-09-13 DIAGNOSIS — B20 Human immunodeficiency virus [HIV] disease: Secondary | ICD-10-CM | POA: Insufficient documentation

## 2022-09-13 DIAGNOSIS — E785 Hyperlipidemia, unspecified: Secondary | ICD-10-CM

## 2022-09-13 DIAGNOSIS — Z23 Encounter for immunization: Secondary | ICD-10-CM

## 2022-09-13 DIAGNOSIS — G459 Transient cerebral ischemic attack, unspecified: Secondary | ICD-10-CM

## 2022-09-13 DIAGNOSIS — I10 Essential (primary) hypertension: Secondary | ICD-10-CM

## 2022-09-13 DIAGNOSIS — I749 Embolism and thrombosis of unspecified artery: Secondary | ICD-10-CM

## 2022-09-13 MED ORDER — VALACYCLOVIR HCL 1 G PO TABS: 1000.0000 mg | ORAL_TABLET | Freq: Every day | ORAL | 11 refills | Status: DC

## 2022-09-13 MED ORDER — CLOPIDOGREL BISULFATE 75 MG PO TABS: 75.0000 mg | ORAL_TABLET | Freq: Every day | ORAL | 3 refills | Status: DC

## 2022-09-13 MED ORDER — ROSUVASTATIN CALCIUM 20 MG PO TABS: 20.0000 mg | ORAL_TABLET | Freq: Every day | ORAL | 11 refills | Status: DC

## 2022-09-13 MED ORDER — METOPROLOL SUCCINATE ER 50 MG PO TB24: 50.0000 mg | ORAL_TABLET | Freq: Every day | ORAL | 4 refills | Status: DC

## 2022-09-13 MED ORDER — SYMTUZA 800-150-200-10 MG PO TABS: 1.0000 | ORAL_TABLET | Freq: Every day | ORAL | 11 refills | Status: DC

## 2022-09-13 NOTE — Progress Notes (Signed)
Subjective:  Chief complaint    Patient ID: [name], female    DOB: 1966/09/22, 56 y.o.   MRN: [id number]  HPI  [name] is a 56 year old woman living with HIV that has been relatively well controlled history of hypertension grief related to the loss of her son was murdered, musculoskeletal pain gastroesophageal reflux disease, hyperlipidemia TIA  Fail STRIBILD with insulin resistance though I cannot find the genotype showing    INSTI Major Mutations: N155H INSTI Accessory Mutations: None IN Other Mutations: S230N , D232E Integrase Strand Transfer Inhibitors bictegravir (BIC) Potential Low-Level Resistance cabotegravir (CAB) Low-Level Resistance dolutegravir (DTG) Potential Low-Level Resistance elvitegravir (EVG) High-Level Resistance raltegravir (RAL) High-Level Resistance IN comments Major N155H is a common nonpolymorphic INSTI-resistance mutations. It has been reported in a high proportion of persons developing VF and HIVDR while receiving RAL, EVG, DTG, and CAB. Alone, it reduces RAL and EVG susceptibility about 10 and 30-fold, respectively. It has minimal effect on susceptibility to DTG, BIC, and CAB. Other S230N is a polymorphism that is not associated with reduced INSTI susceptibility. This virus is predicted to have low-level reduced susceptibility to CAB. The use of the combination of CAB/RPV should be considered to be relatively contraindicated.  She is doing relatively well that she did not take her antihypertensive medications today.  I mentioned her cholesterol-lowering medicine and her LDL not being at goal she then told me that she has had intermittent problems with heart palpitations that come and go is been going on for several months but not happening right now.  When she has them she feels tired as well briefly until they resolve.    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   Nonallopathic lesion of head 11/16/2017   Shingles    2000   Vaccine counseling 12/01/2021    Past Surgical History:  Procedure Laterality Date   ABDOMINAL HYSTERECTOMY  1993   Partial   CESAREAN SECTION     LAPAROSCOPIC ASSISTED VENTRAL HERNIA REPAIR N/A 01/02/2015   Procedure: LAPAROSCOPIC VENTRAL WALL HERNIA REPAIR;  Surgeon: Karie Soda, MD;  Location: [facility];  Service: General;  Laterality: N/A;  With MESH   TONSILLECTOMY  1988    Family History  Problem Relation Age of Onset   Diabetes type II Brother    Migraines Neg Hx       Social History   Socioeconomic History   Marital status: Single    Spouse name: Not on file   Number of children: Not on file   Years of education: HS   Highest education level: Not on file  Occupational History   Occupation: disabled  Tobacco Use   Smoking status: Former    Types: Cigarettes   Smokeless tobacco: Never  Vaping Use   Vaping Use: Never used  Substance and Sexual Activity   Alcohol use:  Yes    Comment: occasional   Drug use: Yes    Frequency: 14.0 times per week    Types: Marijuana    Comment: daily   Sexual activity: Not Currently    Comment: given condoms  Other Topics Concern   Not on file  Social History Narrative   Not on file   Social Determinants of Health   Financial Resource Strain: Not on file  Food Insecurity: Not on file  Transportation Needs: Not on file  Physical Activity: Not on file  Stress: Not on file  Social Connections: Not on file     Allergies  Allergen Reactions   Morphine Other (See Comments)    Burning under the skin   Doxycycline Hyclate Rash     Current Outpatient Medications:    clopidogrel (PLAVIX) 75 MG tablet, Take 1 tablet (75 mg total) by mouth daily., Disp: 90 tablet, Rfl: 3   cyclobenzaprine (FLEXERIL) 10 MG tablet, Take 1 tablet (10 mg total) by mouth 3 (three) times daily as needed., Disp: 90 tablet, Rfl: 3   Darunavir-Cobicistat-Emtricitabine-Tenofovir Alafenamide (SYMTUZA) 800-150-200-10 MG TABS, Take 1 tablet by mouth daily with breakfast., Disp: 30 tablet, Rfl: 11   gabapentin (NEURONTIN) 300 MG capsule, TAKE 1 CAPSULE BY MOUTH THREE TIMES A DAY, Disp: 270 capsule, Rfl: 4   metoprolol succinate (TOPROL XL) 50 MG 24 hr tablet, Take 1 tablet (50 mg total) by mouth daily. Take with or immediately following a meal., Disp: 90 tablet, Rfl: 4   omeprazole (PRILOSEC) 20 MG capsule, Take 1 capsule (20 mg total) by mouth daily., Disp: 30 capsule, Rfl: 2   rosuvastatin (CRESTOR) 20 MG tablet, Take 1 tablet (20 mg total) by mouth daily., Disp: 30 tablet, Rfl: 11   valACYclovir (VALTREX) 1000 MG tablet, Take 1 tablet (1,000 mg total) by mouth daily., Disp: 30 tablet, Rfl: 11   Review of Systems  Constitutional:  Negative for activity change, appetite change, chills, diaphoresis, fatigue, fever and unexpected weight change.  HENT:  Negative for congestion, rhinorrhea, sinus pressure, sneezing, sore throat and trouble swallowing.   Eyes:  Negative for photophobia and visual disturbance.  Respiratory:  Negative for cough, chest tightness, shortness of breath, wheezing and stridor.   Cardiovascular:  Negative for chest pain, palpitations and leg swelling.  Gastrointestinal:  Negative for abdominal distention, abdominal pain, anal bleeding, blood in stool, constipation, diarrhea, nausea and vomiting.  Genitourinary:  Negative for difficulty urinating, dysuria, flank pain and hematuria.  Musculoskeletal:   Negative for arthralgias, back pain, gait problem, joint swelling and myalgias.  Skin:  Negative for color change, pallor, rash and wound.  Neurological:  Negative for dizziness, tremors, weakness and light-headedness.  Hematological:  Negative for adenopathy. Does not bruise/bleed easily.  Psychiatric/Behavioral:  Negative for agitation, behavioral problems, confusion, decreased concentration, dysphoric mood and sleep disturbance.        Objective:   Physical Exam Constitutional:      General: She is not in acute distress.    Appearance: Normal appearance. She is well-developed. She is not ill-appearing or diaphoretic.  HENT:     Head: Normocephalic and atraumatic.     Right Ear: Hearing and external ear normal.     Left Ear: Hearing and external ear normal.     Nose: No nasal deformity or rhinorrhea.  Eyes:     General: No scleral icterus.    Conjunctiva/sclera: Conjunctivae normal.     Right eye: Right conjunctiva is not injected.     Left  eye: Left conjunctiva is not injected.     Pupils: Pupils are equal, round, and reactive to light.  Neck:     Vascular: No JVD.  Cardiovascular:     Rate and Rhythm: Normal rate and regular rhythm.     Heart sounds: Normal heart sounds, S1 normal and S2 normal. No murmur heard.    No friction rub.  Pulmonary:     Effort: No respiratory distress.     Breath sounds: No wheezing.  Abdominal:     General: Bowel sounds are normal. There is no distension.     Palpations: Abdomen is soft.     Tenderness: There is no abdominal tenderness.  Musculoskeletal:        General: Normal range of motion.     Right shoulder: Normal.     Left shoulder: Normal.     Cervical back: Normal range of motion and neck supple.     Right hip: Normal.     Left hip: Normal.     Right knee: Normal.     Left knee: Normal.  Lymphadenopathy:     Head:     Right side of head: No submandibular, preauricular or posterior auricular adenopathy.     Left side of head:  No submandibular, preauricular or posterior auricular adenopathy.     Cervical: No cervical adenopathy.     Right cervical: No superficial or deep cervical adenopathy.    Left cervical: No superficial or deep cervical adenopathy.  Skin:    General: Skin is warm and dry.     Coloration: Skin is not pale.     Findings: No abrasion, bruising, ecchymosis, erythema, lesion or rash.     Nails: There is no clubbing.  Neurological:     Mental Status: She is alert and oriented to person, place, and time.     Sensory: No sensory deficit.     Coordination: Coordination normal.     Gait: Gait normal.  Psychiatric:        Attention and Perception: She is attentive.        Speech: Speech normal.        Behavior: Behavior normal. Behavior is cooperative.        Thought Content: Thought content normal.        Judgment: Judgment normal.           Assessment & Plan:   HIV disease:  I will add order HIV viral load CD4 count CBC with differential CMP, RPR GC and chlamydia and I will continue  Reading   Not terribly eager to rearrange her SYMTUZA to a different regimen.  I would do this to try to accommodate maximizing her statin dose but she already failed and low barrier to resistance integrase strand transfer inhibitor and there are no high barrier to resistance NNRTIs available.  I would feel better just simply keeping her on SYMTUZA until we have other options that have a high barrier to resistance.  Hyperlipidemia:  Not at goal but will continue Crestor 20 mg note this is the maximum dose while someone is on SYMTUZA am also referring her to primary care and perhaps she would benefit by being seen in the lipid clinic.   Hypertension history of TIA: She is to continue her metoprolol and she is also on Plavix as well as Crestor.  Palpitations: We will refer to cardiology wonder if this could be atrial fibrillation   Vaccine counseling recommended flu and COVID-19 shots  which  she received

## 2022-09-14 LAB — URINE CYTOLOGY ANCILLARY ONLY
Chlamydia: NEGATIVE
Comment: NEGATIVE
Comment: NORMAL
Neisseria Gonorrhea: NEGATIVE

## 2022-09-15 LAB — CBC WITH DIFFERENTIAL/PLATELET
Absolute Monocytes: 602 cells/uL (ref 200–950)
Basophils Absolute: 69 cells/uL (ref 0–200)
Basophils Relative: 0.8 %
Eosinophils Absolute: 593 cells/uL — ABNORMAL HIGH (ref 15–500)
Eosinophils Relative: 6.9 %
HCT: 37.8 % (ref 35.0–45.0)
Hemoglobin: 12.2 g/dL (ref 11.7–15.5)
Lymphs Abs: 3922 cells/uL — ABNORMAL HIGH (ref 850–3900)
MCH: 27.5 pg (ref 27.0–33.0)
MCHC: 32.3 g/dL (ref 32.0–36.0)
MCV: 85.1 fL (ref 80.0–100.0)
MPV: 10.3 fL (ref 7.5–12.5)
Monocytes Relative: 7 %
Neutro Abs: 3414 cells/uL (ref 1500–7800)
Neutrophils Relative %: 39.7 %
Platelets: 445 10*3/uL — ABNORMAL HIGH (ref 140–400)
RBC: 4.44 10*6/uL (ref 3.80–5.10)
RDW: 14.1 % (ref 11.0–15.0)
Total Lymphocyte: 45.6 %
WBC: 8.6 10*3/uL (ref 3.8–10.8)

## 2022-09-15 LAB — LIPID PANEL
Cholesterol: 213 mg/dL — ABNORMAL HIGH (ref <200)
HDL: 38 mg/dL — ABNORMAL LOW (ref >=50)
LDL Cholesterol (Calc): 144 mg/dL (calc) — ABNORMAL HIGH
Non-HDL Cholesterol (Calc): 175 mg/dL (calc) — ABNORMAL HIGH (ref <130)
Total CHOL/HDL Ratio: 5.6 (calc) — ABNORMAL HIGH (ref <5.0)
Triglycerides: 177 mg/dL — ABNORMAL HIGH (ref <150)

## 2022-09-15 LAB — COMPLETE METABOLIC PANEL WITH GFR
AG Ratio: 1.6 (calc) (ref 1.0–2.5)
ALT: 38 U/L — ABNORMAL HIGH (ref 6–29)
AST: 27 U/L (ref 10–35)
Albumin: 4.2 g/dL (ref 3.6–5.1)
Alkaline phosphatase (APISO): 106 U/L (ref 37–153)
BUN: 10 mg/dL (ref 7–25)
CO2: 30 mmol/L (ref 20–32)
Calcium: 9.2 mg/dL (ref 8.6–10.4)
Chloride: 106 mmol/L (ref 98–110)
Creat: 0.94 mg/dL (ref 0.50–1.03)
Globulin: 2.6 g/dL (calc) (ref 1.9–3.7)
Glucose, Bld: 99 mg/dL (ref 65–99)
Potassium: 4.7 mmol/L (ref 3.5–5.3)
Sodium: 141 mmol/L (ref 135–146)
Total Bilirubin: 0.2 mg/dL (ref 0.2–1.2)
Total Protein: 6.8 g/dL (ref 6.1–8.1)
eGFR: 71 mL/min/{1.73_m2} (ref >=60)

## 2022-09-15 LAB — T-HELPER CELLS (CD4) COUNT (NOT AT ARMC)
CD4 % Helper T Cell: 26 % — ABNORMAL LOW (ref 33–65)
CD4 T Cell Abs: 918 /uL (ref 400–1790)

## 2022-09-15 LAB — RPR: RPR Ser Ql: NONREACTIVE

## 2022-09-15 LAB — HIV-1 RNA QUANT-NO REFLEX-BLD
HIV 1 RNA Quant: <20 Copies/mL — ABNORMAL HIGH
HIV-1 RNA Quant, Log: <1.3 Log cps/mL — ABNORMAL HIGH

## 2022-09-15 LAB — LDL CHOLESTEROL, DIRECT: Direct LDL: 163 mg/dL — ABNORMAL HIGH (ref <100)

## 2022-10-09 ENCOUNTER — Other Ambulatory Visit: Payer: Self-pay | Admitting: Infectious Disease

## 2022-10-11 ENCOUNTER — Telehealth: Payer: Self-pay

## 2022-10-11 NOTE — Telephone Encounter (Signed)
Ok to refill 

## 2022-10-11 NOTE — Telephone Encounter (Signed)
Refilled request sent to pharmacy for omeprazole.there is a drug to drug interaction with Plavix. Spoke to pharmacist Marchelle Folks who replied statement is below:  I'm assuming she's probably already been on these together since September - it's fine to continue filling as is - does [employer] follow her for her history of a TIA? Hopefully she has another MD she is following for this   Patient has an appointment with cardiology on 12/15.

## 2022-10-15 ENCOUNTER — Ambulatory Visit: Payer: Medicare Other | Attending: Cardiovascular Disease | Admitting: Cardiovascular Disease

## 2022-10-15 ENCOUNTER — Encounter: Payer: Self-pay | Admitting: Cardiovascular Disease

## 2022-10-15 VITALS — BP 135/92 | HR 83 | Ht 65.0 in | Wt 233.0 lb

## 2022-10-15 DIAGNOSIS — E782 Mixed hyperlipidemia: Secondary | ICD-10-CM | POA: Diagnosis not present

## 2022-10-15 DIAGNOSIS — I10 Essential (primary) hypertension: Secondary | ICD-10-CM

## 2022-10-15 NOTE — Patient Instructions (Addendum)
Medication Instructions:  Your physician recommends that you continue on your current medications as directed. Please refer to the Current Medication list given to you today.  *If you need a refill on your cardiac medications before your next appointment, please call your pharmacy*   Lab Work: none If you have labs (blood work) drawn today and your tests are completely normal, you will receive your results only by: MyChart Message (if you have MyChart) OR A paper copy in the mail If you have any lab test that is abnormal or we need to change your treatment, we will call you to review the results.   Testing/Procedures: none   Follow-Up: At [facility], you and your health needs are our priority.  As part of our continuing mission to provide you with exceptional heart care, we have created designated Provider Care Teams.  These Care Teams include your primary Cardiologist (physician) and Advanced Practice Providers (APPs -  Physician Assistants and Nurse Practitioners) who all work together to provide you with the care you need, when you need it.  We recommend signing up for the patient portal called "MyChart".  Sign up information is provided on this After Visit Summary.  MyChart is used to connect with patients for Virtual Visits (Telemedicine).  Patients are able to view lab/test results, encounter notes, upcoming appointments, etc.  Non-urgent messages can be sent to your provider as well.   To learn more about what you can do with MyChart, go to [email or web address].    Your next appointment:   04/19/23 at 9:00  The format for your next appointment:   In Person  Provider:   Kristeen Miss, MD     Other Instructions    Important Information About Sugar        Adopting a Healthy Lifestyle.   Weight: Know what a healthy weight is for you (roughly BMI <25) and aim to maintain this. You can calculate your body mass index on your smart phone  Diet: Aim for 7+  servings of fruits and vegetables daily Limit animal fats in diet for cholesterol and heart health - choose grass fed whenever available Avoid highly processed foods (fast food burgers, tacos, fried chicken, pizza, hot dogs, french fries)  Saturated fat comes in the form of butter, lard, coconut oil, margarine, partially hydrogenated oils, and fat in meat. These increase your risk of cardiovascular disease.  Use healthy plant oils, such as olive, canola, soy, corn, sunflower and peanut.  Whole foods such as fruits, vegetables and whole grains have fiber  Men need > 38 grams of fiber per day Women need > 25 grams of fiber per day  Load up on vegetables and fruits - one-half of your plate: Aim for color and variety, and remember that potatoes dont count. Go for whole grains - one-quarter of your plate: Whole wheat, barley, wheat berries, quinoa, oats, brown rice, and foods made with them. If you want pasta, go with whole wheat pasta. Protein power - one-quarter of your plate: Fish, chicken, beans, and nuts are all healthy, versatile protein sources. Limit red meat. You need carbohydrates for energy! The type of carbohydrate is more important than the amount. Choose carbohydrates such as vegetables, fruits, whole grains, beans, and nuts in the place of white rice, white pasta, potatoes (baked or fried), macaroni and cheese, cakes, cookies, and donuts.  If youre thirsty, drink water. Coffee and tea are good in moderation, but skip sugary drinks and limit milk and dairy products to  one or two daily servings. Keep sugar intake at 6 teaspoons or 24 grams or LESS       Exercise: Aim for 150 min of moderate intensity exercise weekly for heart health, and weights twice weekly for bone health Stay active - any steps are better than no steps! Aim for 7-9 hours of sleep daily    Greatly reduce  foods that are White, wheat, sweet  - white foods - limit your  rice, pasta, potatoes, corn.  if you do eat  these, make sure its brown rice, whole wheat pasta  - wheat - limit intake of all bread, biscuits, pizza dough.   The bread you do eat should be whole wheat bread.  - Sweets - limit cakes, cookies, desserts, ice cream, soda, sweet tea   Increase exercise

## 2022-10-15 NOTE — Progress Notes (Signed)
Cardiology Office Note:    Date:  10/15/2022   ID:  Kelly MohSharon Seith, DOB 1966-04-27, MRN [id number]  PCP:  Verdell FaceLim, Sheri, DO   [facility] Providers Cardiologist:  Israel Wunder   Referring MD: Daiva EvesVan Dam, Lisette Grinderornelius N, MD   Chief Complaint  Patient presents with   Palpitations    History of Present Illness:    [name] is a 56 y.o. female with a hx of  HIV disease, obesty, HTN,HLD and progressive dyspenea   We were asked to see her for worsening DOE  Has severe DOE with housework   Smoked for years.  Cocaine ( crack, snort ) off and on for 20 years No cocaine use since 2008 Still smokes marijuana regularly   She can hear wheezing at night when she goes to bed.    Occasional episodes of CP  Echocardiogram from March, 2022 shows normal left ventricular systolic function.  Normal diastolic function.  No significant valvular disease.  Had a mild stroke in March 2022 Was told she had carotid disease, was started on Atorva and plavix   No exercise per se,   we discussed exercise , weight loss     Past Medical History:  Diagnosis Date   Anemia    Depression with anxiety 05/13/2021   GERD (gastroesophageal reflux disease)    Grieving 05/13/2021   Headache    hx of migraines    History of cocaine abuse (HCC)    HIV disease (HCC)    HIV infection (HCC)    Hyperlipidemia 08/18/2021   Hypertension    not on medication    Incarcerated incisional hernia s/p lap VWH repair 01/02/2015 12/18/2014   Muscle spasm 05/11/2017   Myocardial infarction (HCC)    mild MI 09/2004     no heart problems since   Neck swelling 11/16/2017   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   cyclobenzaprine (FLEXERIL) 10 MG tablet Take 1 tablet (10 mg total) by mouth 3 (three) times daily as needed.   Darunavir-Cobicistat-Emtricitabine-Tenofovir Alafenamide (SYMTUZA) 800-150-200-10 MG TABS Take 1 tablet by mouth daily with breakfast.   gabapentin (NEURONTIN) 300 MG capsule TAKE 1 CAPSULE BY MOUTH THREE TIMES A DAY   metoprolol succinate (TOPROL XL) 50 MG 24 hr tablet Take 1 tablet (50 mg total) by mouth daily. Take with or immediately following a meal.   omeprazole (PRILOSEC) 20 MG capsule TAKE 1 CAPSULE BY MOUTH EVERY DAY   rosuvastatin (CRESTOR) 20 MG tablet Take 1 tablet (20 mg total) by mouth daily.   valACYclovir (VALTREX) 1000 MG tablet Take 1 tablet (1,000 mg total) by mouth daily.     Allergies:   Morphine and Doxycycline hyclate   Social History   Socioeconomic History   Marital status: Single    Spouse name: Not on file   Number of children: Not on file  Years of education: HS   Highest education level: Not on file  Occupational History   Occupation: disabled  Tobacco Use   Smoking status: Former    Types: Cigarettes   Smokeless tobacco: Never  Vaping Use   Vaping Use: Never used  Substance and Sexual Activity   Alcohol use: Yes    Comment: occasional   Drug use: Yes    Frequency: 14.0 times per week    Types: Marijuana    Comment: daily   Sexual activity: Not Currently    Comment: given condoms  Other Topics Concern   Not on file  Social History Narrative   Not on file   Social Determinants of Health   Financial Resource Strain: Not on file  Food Insecurity: Not on file  Transportation Needs: Not on file  Physical Activity: Not on file  Stress: Not on file  Social  Connections: Not on file     Family History: The patient's family history includes Diabetes type II in her brother. There is no history of Migraines.  ROS:   Please see the history of present illness.     All other systems reviewed and are negative.  EKGs/Labs/Other Studies Reviewed:    The following studies were reviewed today:   EKG:  Dec. 15, 2023:   NSR at 83.   NS ST abn   Recent Labs: 09/13/2022: ALT 38; BUN 10; Creat 0.94; Hemoglobin 12.2; Platelets 445; Potassium 4.7; Sodium 141  Recent Lipid Panel    Component Value Date/Time   CHOL 213 (H) 09/13/2022 0917   TRIG 177 (H) 09/13/2022 0917   HDL 38 (L) 09/13/2022 0917   CHOLHDL 5.6 (H) 09/13/2022 0917   VLDL 33 01/06/2021 2137   LDLCALC 144 (H) 09/13/2022 0917   LDLDIRECT 163 (H) 09/13/2022 0917     Risk Assessment/Calculations:      HYPERTENSION CONTROL Vitals:   10/15/22 1507 10/15/22 1545  BP: (!) 150/90 (!) 135/92    The patient's blood pressure is elevated above target today.  In order to address the patient's elevated BP: Blood pressure will be monitored at home to determine if medication changes need to be made.            Physical Exam:    VS:  BP (!) 135/92   Pulse 83   Ht 5\' 5"  (1.651 m)   Wt 233 lb (105.7 kg)   SpO2 98%   BMI 38.77 kg/m     Wt Readings from Last 3 Encounters:  10/15/22 233 lb (105.7 kg)  09/13/22 235 lb (106.6 kg)  07/12/22 232 lb (105.2 kg)     GEN: Middle-aged, moderately obese female in no acute distress HEENT: Normal NECK: No JVD; No carotid bruits LYMPHATICS: No lymphadenopathy CARDIAC: RRR, no murmurs, rubs, gallops RESPIRATORY:  Clear to auscultation without rales, wheezing or rhonchi  ABDOMEN: Soft, non-tender, non-distended MUSCULOSKELETAL:  No edema; No deformity  SKIN: Warm and dry NEUROLOGIC:  Alert and oriented x 3 PSYCHIATRIC:  Normal affect   ASSESSMENT:    1. Benign essential HTN   2. Mixed hyperlipidemia    PLAN:    In order of  problems listed above:  HTN:   still eats very high salt diet .     Will work on diet improved diet, exercise, weight loss  Will continue to monitor   2. Palpitations:   do not sound serious   3.   Dyspnea:  needs to stop smoking completely .  She still  smokes marijuana on occasion/frequently.           Medication Adjustments/Labs and Tests Ordered: Current medicines are reviewed at length with the patient today.  Concerns regarding medicines are outlined above.  Orders Placed This Encounter  Procedures   EKG 12-Lead   No orders of the defined types were placed in this encounter.   Patient Instructions   Medication Instructions:  Your physician recommends that you continue on your current medications as directed. Please refer to the Current Medication list given to you today.  *If you need a refill on your cardiac medications before your next appointment, please call your pharmacy*   Lab Work: none If you have labs (blood work) drawn today and your tests are completely normal, you will receive your results only by: MyChart Message (if you have MyChart) OR A paper copy in the mail If you have any lab test that is abnormal or we need to change your treatment, we will call you to review the results.   Testing/Procedures: none   Follow-Up: At [facility], you and your health needs are our priority.  As part of our continuing mission to provide you with exceptional heart care, we have created designated Provider Care Teams.  These Care Teams include your primary Cardiologist (physician) and Advanced Practice Providers (APPs -  Physician Assistants and Nurse Practitioners) who all work together to provide you with the care you need, when you need it.  We recommend signing up for the patient portal called "MyChart".  Sign up information is provided on this After Visit Summary.  MyChart is used to connect with patients for Virtual Visits (Telemedicine).  Patients are  able to view lab/test results, encounter notes, upcoming appointments, etc.  Non-urgent messages can be sent to your provider as well.   To learn more about what you can do with MyChart, go to [email or web address].    Your next appointment:   04/19/23 at 9:00  The format for your next appointment:   In Person  Provider:   Kristeen Miss, MD     Other Instructions    Important Information About Sugar        Adopting a Healthy Lifestyle.   Weight: Know what a healthy weight is for you (roughly BMI <25) and aim to maintain this. You can calculate your body mass index on your smart phone  Diet: Aim for 7+ servings of fruits and vegetables daily Limit animal fats in diet for cholesterol and heart health - choose grass fed whenever available Avoid highly processed foods (fast food burgers, tacos, fried chicken, pizza, hot dogs, french fries)  Saturated fat comes in the form of butter, lard, coconut oil, margarine, partially hydrogenated oils, and fat in meat. These increase your risk of cardiovascular disease.  Use healthy plant oils, such as olive, canola, soy, corn, sunflower and peanut.  Whole foods such as fruits, vegetables and whole grains have fiber  Men need > 38 grams of fiber per day Women need > 25 grams of fiber per day  Load up on vegetables and fruits - one-half of your plate: Aim for color and variety, and remember that potatoes dont count. Go for whole grains - one-quarter of your plate: Whole wheat, barley, wheat berries, quinoa, oats, brown rice, and foods made with them. If you want pasta, go with whole wheat pasta. Protein power - one-quarter of your plate: Fish, chicken, beans, and nuts are all healthy, versatile protein sources. Limit red meat. You need carbohydrates  for energy! The type of carbohydrate is more important than the amount. Choose carbohydrates such as vegetables, fruits, whole grains, beans, and nuts in the place of white rice, white pasta,  potatoes (baked or fried), macaroni and cheese, cakes, cookies, and donuts.  If youre thirsty, drink water. Coffee and tea are good in moderation, but skip sugary drinks and limit milk and dairy products to one or two daily servings. Keep sugar intake at 6 teaspoons or 24 grams or LESS       Exercise: Aim for 150 min of moderate intensity exercise weekly for heart health, and weights twice weekly for bone health Stay active - any steps are better than no steps! Aim for 7-9 hours of sleep daily    Greatly reduce  foods that are White, wheat, sweet  - white foods - limit your  rice, pasta, potatoes, corn.  if you do eat these, make sure its brown rice, whole wheat pasta  - wheat - limit intake of all bread, biscuits, pizza dough.   The bread you do eat should be whole wheat bread.  - Sweets - limit cakes, cookies, desserts, ice cream, soda, sweet tea   Increase exercise        Signed, Kristeen Miss, MD  10/15/2022 6:02 PM    [facility]

## 2022-11-20 ENCOUNTER — Other Ambulatory Visit: Payer: Self-pay | Admitting: Infectious Disease

## 2022-12-30 ENCOUNTER — Ambulatory Visit: Payer: 59 | Admitting: Infectious Disease

## 2022-12-30 ENCOUNTER — Telehealth: Payer: Self-pay

## 2022-12-30 NOTE — Telephone Encounter (Signed)
Please advise regarding DDI with omeprazole and clopidogrel.   Beryle Flock, RN

## 2022-12-30 NOTE — Telephone Encounter (Signed)
Patient left voicemail stating she missed her appointment this morning due to not feeling well.   Called her back and rescheduled for 4/3. She is requesting refills of omeprazole and cyclobenzaprine.  Beryle Flock, RN

## 2022-12-30 NOTE — Addendum Note (Signed)
Addended by: Lucie Leather D on: 12/30/2022 03:49 PM   Modules accepted: Orders

## 2022-12-30 NOTE — Telephone Encounter (Signed)
Patient has been on combination for well over a year and seems to be tolerating well. Omeprazole can theoretically decrease Plavix concentrations, but there truly is such a mix of data about it. Some data says it impacts CV outcomes and others show it doesn't, so I'm not overly concerned about it. Thanks for checking! - Estill Bamberg

## 2022-12-31 MED ORDER — CYCLOBENZAPRINE HCL 10 MG PO TABS: 10.0000 mg | ORAL_TABLET | Freq: Three times a day (TID) | ORAL | 0 refills | Status: DC | PRN

## 2022-12-31 MED ORDER — OMEPRAZOLE 20 MG PO CPDR: 20.0000 mg | DELAYED_RELEASE_CAPSULE | Freq: Every day | ORAL | 1 refills | Status: DC

## 2022-12-31 NOTE — Addendum Note (Signed)
Addended by: Lucie Leather D on: 12/31/2022 09:26 AM   Modules accepted: Orders

## 2022-12-31 NOTE — Telephone Encounter (Signed)
See above

## 2023-01-03 ENCOUNTER — Ambulatory Visit: Payer: Medicare Other | Admitting: Infectious Disease

## 2023-01-31 ENCOUNTER — Other Ambulatory Visit: Payer: Self-pay | Admitting: Infectious Disease

## 2023-02-02 ENCOUNTER — Ambulatory Visit (INDEPENDENT_AMBULATORY_CARE_PROVIDER_SITE_OTHER): Payer: 59 | Admitting: Infectious Disease

## 2023-02-02 ENCOUNTER — Other Ambulatory Visit: Payer: Self-pay

## 2023-02-02 ENCOUNTER — Encounter: Payer: Self-pay | Admitting: Infectious Disease

## 2023-02-02 ENCOUNTER — Other Ambulatory Visit (HOSPITAL_COMMUNITY)
Admission: RE | Admit: 2023-02-02 | Discharge: 2023-02-02 | Disposition: A | Discharge status: Home / Self Care | Payer: 59 | Source: Ambulatory Visit | Attending: Infectious Disease | Admitting: Infectious Disease

## 2023-02-02 VITALS — BP 161/99 | HR 68 | Temp 97.8°F | Wt 235.2 lb

## 2023-02-02 DIAGNOSIS — I10 Essential (primary) hypertension: Secondary | ICD-10-CM | POA: Diagnosis not present

## 2023-02-02 DIAGNOSIS — G629 Polyneuropathy, unspecified: Secondary | ICD-10-CM | POA: Diagnosis not present

## 2023-02-02 DIAGNOSIS — E782 Mixed hyperlipidemia: Secondary | ICD-10-CM

## 2023-02-02 DIAGNOSIS — G459 Transient cerebral ischemic attack, unspecified: Secondary | ICD-10-CM

## 2023-02-02 DIAGNOSIS — Z23 Encounter for immunization: Secondary | ICD-10-CM

## 2023-02-02 DIAGNOSIS — M791 Myalgia, unspecified site: Secondary | ICD-10-CM

## 2023-02-02 DIAGNOSIS — G63 Polyneuropathy in diseases classified elsewhere: Secondary | ICD-10-CM

## 2023-02-02 DIAGNOSIS — Z7185 Encounter for immunization safety counseling: Secondary | ICD-10-CM

## 2023-02-02 DIAGNOSIS — B20 Human immunodeficiency virus [HIV] disease: Secondary | ICD-10-CM | POA: Diagnosis not present

## 2023-02-02 HISTORY — DX: Myalgia, unspecified site: M79.10

## 2023-02-02 MED ORDER — CLOPIDOGREL BISULFATE 75 MG PO TABS: 75.0000 mg | ORAL_TABLET | Freq: Every day | ORAL | 3 refills | Status: DC

## 2023-02-02 MED ORDER — ROSUVASTATIN CALCIUM 20 MG PO TABS: 20.0000 mg | ORAL_TABLET | Freq: Every day | ORAL | 11 refills | Status: DC

## 2023-02-02 MED ORDER — CYCLOBENZAPRINE HCL 10 MG PO TABS: 10.0000 mg | ORAL_TABLET | Freq: Three times a day (TID) | ORAL | 0 refills | Status: DC | PRN

## 2023-02-02 MED ORDER — SYMTUZA 800-150-200-10 MG PO TABS: 1.0000 | ORAL_TABLET | Freq: Every day | ORAL | 11 refills | Status: DC

## 2023-02-02 MED ORDER — GABAPENTIN 300 MG PO CAPS: ORAL_CAPSULE | ORAL | 4 refills | Status: DC

## 2023-02-02 MED ORDER — VALACYCLOVIR HCL 1 G PO TABS: 1000.0000 mg | ORAL_TABLET | Freq: Every day | ORAL | 11 refills | Status: DC

## 2023-02-02 MED ORDER — METOPROLOL SUCCINATE ER 50 MG PO TB24: 50.0000 mg | ORAL_TABLET | Freq: Every day | ORAL | 4 refills | Status: DC

## 2023-02-02 NOTE — Progress Notes (Signed)
Subjective:  Chief complaint follow-up for HIV disease on patient still suffering from muscle spasms   Patient ID: [name], female    DOB: September 14, 1966, 57 y.o.   MRN: [id number]  HPI  [name] is a 57 year old woman living with HIV that has been relatively well controlled history of hypertension. hyperlipidemia TIA, palpitations, HSV, prior zoster, obesity who had failed failed Stribild with resistance though I cannot find the genotype showing    INSTI Major Mutations: N155H INSTI Accessory Mutations: None IN Other Mutations: S230N , D232E Integrase Strand Transfer Inhibitors bictegravir (BIC) Potential Low-Level Resistance cabotegravir (CAB) Low-Level Resistance dolutegravir (DTG) Potential Low-Level Resistance elvitegravir (EVG) High-Level Resistance raltegravir (RAL) High-Level Resistance IN comments Major N155H is a common nonpolymorphic INSTI-resistance mutations. It has been reported in a high proportion of persons developing VF and HIVDR while receiving RAL, EVG, DTG, and CAB. Alone, it reduces RAL and EVG susceptibility about 10 and 30-fold, respectively. It has minimal effect on susceptibility to DTG, BIC, and CAB. Other S230N is a polymorphism that is not associated with reduced INSTI susceptibility. This virus is predicted to have low-level reduced susceptibility to CAB. The use of the combination of CAB/RPV should be considered to be relatively contraindicated.  [name] is in good spirits her blood pressure is elevated because she did not take her medications again for come to clinic.  She did see Dr. Katharina Caper for her palpitations who gave her advice with regards to weight management.  She is taking Plavix but also taking omeprazole which lowers the levels of this drug.  She is still suffering from muscle spasms and pains in her legs and also in times in her flanks.      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 06/25/2015   Nonallopathic lesion of head 11/16/2017   Shingles    2000   Vaccine counseling 12/01/2021    Past Surgical History:  Procedure Laterality Date   ABDOMINAL HYSTERECTOMY  1993   Partial   CESAREAN SECTION     LAPAROSCOPIC ASSISTED VENTRAL HERNIA REPAIR N/A 01/02/2015   Procedure: LAPAROSCOPIC VENTRAL WALL HERNIA REPAIR;  Surgeon: Michael Boston, MD;  Location: [facility];  Service: General;  Laterality: N/A;  With MESH   TONSILLECTOMY  1988    Family History  Problem Relation Age of Onset   Diabetes type II Brother    Migraines Neg Hx       Social History   Socioeconomic History   Marital status: Single    Spouse name: Not on file   Number of children: Not on file   Years of education: HS   Highest education level: Not on file  Occupational History   Occupation: disabled  Tobacco Use   Smoking status: Former    Types: Cigarettes   Smokeless tobacco: Never  Vaping Use   Vaping Use: Never used  Substance and Sexual Activity  Alcohol use: Yes    Comment: occasional   Drug use: Yes    Frequency: 14.0 times per week    Types: Marijuana    Comment: daily   Sexual activity: Not Currently    Comment: given condoms  Other Topics Concern   Not on file  Social History Narrative   Not on file   Social Determinants of Health   Financial Resource Strain: Not on file  Food Insecurity: Not on file  Transportation Needs: Not on file  Physical Activity: Not on file  Stress: Not on file  Social Connections: Not on file     Allergies  Allergen Reactions   Morphine Other (See Comments)    Burning under the skin   Doxycycline Hyclate Rash     Current Outpatient Medications:    clopidogrel (PLAVIX) 75 MG tablet, Take 1 tablet (75 mg total) by mouth daily., Disp: 90 tablet, Rfl: 3   omeprazole (PRILOSEC) 20 MG capsule, Take 1 capsule (20 mg total) by mouth daily., Disp: 30 capsule, Rfl: 1   cyclobenzaprine (FLEXERIL) 10 MG tablet, Take 1 tablet (10 mg total) by mouth 3 (three) times daily as needed., Disp: 90 tablet, Rfl: 0   Darunavir-Cobicistat-Emtricitabine-Tenofovir Alafenamide (SYMTUZA) 800-150-200-10 MG TABS, Take 1 tablet by mouth daily with breakfast., Disp: 30 tablet, Rfl: 11   gabapentin (NEURONTIN) 300 MG capsule, TAKE 1 CAPSULE BY MOUTH THREE TIMES A DAY, Disp: 270 capsule, Rfl: 4   metoprolol succinate (TOPROL XL) 50 MG 24 hr tablet, Take 1 tablet (50 mg total) by mouth daily. Take with or immediately following a meal., Disp: 90 tablet, Rfl: 4   rosuvastatin (CRESTOR) 20 MG tablet, Take 1 tablet (20 mg total) by mouth daily., Disp: 30 tablet, Rfl: 11   valACYclovir (VALTREX) 1000 MG tablet, Take 1 tablet (1,000 mg total) by mouth daily., Disp: 30 tablet, Rfl: 11   Review of Systems  Constitutional:  Negative for activity change, appetite change, chills, diaphoresis, fatigue, fever and unexpected weight change.  HENT:  Negative for congestion, rhinorrhea, sinus pressure, sneezing, sore throat and trouble swallowing.   Eyes:  Negative for photophobia and visual disturbance.  Respiratory:  Negative for cough, chest tightness, shortness of breath, wheezing and stridor.   Cardiovascular:  Negative for chest pain, palpitations and leg swelling.  Gastrointestinal:  Negative for abdominal distention, abdominal pain, anal bleeding, blood in stool, constipation, diarrhea, nausea and vomiting.  Genitourinary:  Negative for difficulty urinating, dysuria, flank pain and hematuria.  Musculoskeletal:   Negative for arthralgias, back pain, gait problem, joint swelling and myalgias.  Skin:  Negative for color change, pallor, rash and wound.  Neurological:  Negative for dizziness, tremors, weakness and light-headedness.  Hematological:  Negative for adenopathy. Does not bruise/bleed easily.  Psychiatric/Behavioral:  Negative for agitation, behavioral problems, confusion, decreased concentration, dysphoric mood and sleep disturbance.        Objective:   Physical Exam Constitutional:      General: She is not in acute distress.    Appearance: Normal appearance. She is well-developed. She is not ill-appearing or diaphoretic.  HENT:     Head: Normocephalic and atraumatic.     Right Ear: Hearing and external ear normal.     Left Ear: Hearing and external ear normal.     Nose: No nasal deformity or rhinorrhea.  Eyes:     General: No scleral icterus.    Conjunctiva/sclera: Conjunctivae normal.     Right eye: Right conjunctiva is not injected.  Left eye: Left conjunctiva is not injected.     Pupils: Pupils are equal, round, and reactive to light.  Neck:     Vascular: No JVD.  Cardiovascular:     Rate and Rhythm: Normal rate and regular rhythm.     Heart sounds: Normal heart sounds, S1 normal and S2 normal. No murmur heard.    No friction rub.  Abdominal:     General: Bowel sounds are normal. There is no distension.     Palpations: Abdomen is soft.     Tenderness: There is no abdominal tenderness.  Musculoskeletal:        General: Normal range of motion.     Right shoulder: Normal.     Left shoulder: Normal.     Cervical back: Normal range of motion and neck supple.     Right hip: Normal.     Left hip: Normal.     Right knee: Normal.     Left knee: Normal.  Lymphadenopathy:     Head:     Right side of head: No submandibular, preauricular or posterior auricular adenopathy.     Left side of head: No submandibular, preauricular or posterior auricular adenopathy.     Cervical: No  cervical adenopathy.     Right cervical: No superficial or deep cervical adenopathy.    Left cervical: No superficial or deep cervical adenopathy.  Skin:    General: Skin is warm and dry.     Coloration: Skin is not pale.     Findings: No abrasion, bruising, ecchymosis, erythema, lesion or rash.     Nails: There is no clubbing.  Neurological:     General: No focal deficit present.     Mental Status: She is alert and oriented to person, place, and time.     Sensory: No sensory deficit.     Coordination: Coordination normal.     Gait: Gait normal.  Psychiatric:        Attention and Perception: She is attentive.        Mood and Affect: Mood normal.        Speech: Speech normal.        Behavior: Behavior normal. Behavior is cooperative.        Thought Content: Thought content normal.        Judgment: Judgment normal.           Assessment & Plan:    HIV disease:  I will add order HIV viral load CD4 count CBC with differential CMP, RPR GC and chlamydia and I will continue  [place] prescription  Hypertension:  Refilled her metoprolol.  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  GERD refilled omeprazole though it has a problem interaction with the Plavix I will inquire with pharmacy what how we could mitigate this risk  Vaccine counseling recommended tetanus and Prevnar 20 which she received.  Also recommended goals vaccine series.    I spent 42 minutes with the patient including than 50% of the time in face to face counseling of the patient HIV hypertension hyperlipidemia history of zoster,and all of the above issues along with review of medical records in preparation for the visit and during the visit  and in coordination of her  care.

## 2023-02-03 LAB — URINE CYTOLOGY ANCILLARY ONLY
Chlamydia: NEGATIVE
Comment: NEGATIVE
Comment: NORMAL
Neisseria Gonorrhea: NEGATIVE

## 2023-02-03 LAB — T-HELPER CELLS (CD4) COUNT (NOT AT ARMC)
CD4 % Helper T Cell: 27 % — ABNORMAL LOW (ref 33–65)
CD4 T Cell Abs: 850 /uL (ref 400–1790)

## 2023-02-04 ENCOUNTER — Telehealth: Payer: Self-pay

## 2023-02-04 LAB — LIPID PANEL
Cholesterol: 235 mg/dL — ABNORMAL HIGH (ref <200)
HDL: 40 mg/dL — ABNORMAL LOW (ref >=50)
LDL Cholesterol (Calc): 163 mg/dL (calc) — ABNORMAL HIGH
Non-HDL Cholesterol (Calc): 195 mg/dL (calc) — ABNORMAL HIGH (ref <130)
Total CHOL/HDL Ratio: 5.9 (calc) — ABNORMAL HIGH (ref <5.0)
Triglycerides: 169 mg/dL — ABNORMAL HIGH (ref <150)

## 2023-02-04 LAB — COMPLETE METABOLIC PANEL WITH GFR
AG Ratio: 1.5 (calc) (ref 1.0–2.5)
ALT: 27 U/L (ref 6–29)
AST: 18 U/L (ref 10–35)
Albumin: 4.3 g/dL (ref 3.6–5.1)
Alkaline phosphatase (APISO): 113 U/L (ref 37–153)
BUN: 10 mg/dL (ref 7–25)
CO2: 27 mmol/L (ref 20–32)
Calcium: 9.9 mg/dL (ref 8.6–10.4)
Chloride: 103 mmol/L (ref 98–110)
Creat: 0.94 mg/dL (ref 0.50–1.03)
Globulin: 2.8 g/dL (calc) (ref 1.9–3.7)
Glucose, Bld: 123 mg/dL — ABNORMAL HIGH (ref 65–99)
Potassium: 4.1 mmol/L (ref 3.5–5.3)
Sodium: 141 mmol/L (ref 135–146)
Total Bilirubin: 0.2 mg/dL (ref 0.2–1.2)
Total Protein: 7.1 g/dL (ref 6.1–8.1)
eGFR: 71 mL/min/{1.73_m2} (ref >=60)

## 2023-02-04 LAB — CBC WITH DIFFERENTIAL/PLATELET
Absolute Monocytes: 519 cells/uL (ref 200–950)
Basophils Absolute: 69 cells/uL (ref 0–200)
Basophils Relative: 0.7 %
Eosinophils Absolute: 314 cells/uL (ref 15–500)
Eosinophils Relative: 3.2 %
HCT: 38.9 % (ref 35.0–45.0)
Hemoglobin: 12.7 g/dL (ref 11.7–15.5)
Lymphs Abs: 3655.4 cells/uL (ref 850–3900)
MCH: 27.6 pg (ref 27.0–33.0)
MCHC: 32.6 g/dL (ref 32.0–36.0)
MCV: 84.6 fL (ref 80.0–100.0)
MPV: 10.7 fL (ref 7.5–12.5)
Monocytes Relative: 5.3 %
Neutro Abs: 5243 cells/uL (ref 1500–7800)
Neutrophils Relative %: 53.5 %
Platelets: 491 10*3/uL — ABNORMAL HIGH (ref 140–400)
RBC: 4.6 10*6/uL (ref 3.80–5.10)
RDW: 13.8 % (ref 11.0–15.0)
Total Lymphocyte: 37.3 %
WBC: 9.8 10*3/uL (ref 3.8–10.8)

## 2023-02-04 LAB — RPR: RPR Ser Ql: NONREACTIVE

## 2023-02-04 LAB — HIV-1 RNA QUANT-NO REFLEX-BLD
HIV 1 RNA Quant: 93 Copies/mL — ABNORMAL HIGH
HIV-1 RNA Quant, Log: 1.97 Log cps/mL — ABNORMAL HIGH

## 2023-02-04 NOTE — Telephone Encounter (Signed)
Cumulative HIV Genotype Data  Genotype Dates: 2007 - present   RT Mutations  S162C, I178M, T200A, R211K, F227K, V245E, I293V, K311R  PI Mutations  M36I, R41K  Integrase Mutations  N155H, S230N, D232E   Interpretation of Genotype Data per [employer] HIV Drug Resistance Database:  Nucleoside RTIs  Abacavir - Susceptible Zidovudine - Susceptible Emtricitabine - Susceptible Lamivudine - Susceptible Tenofovir - Susceptible   Non-Nucleoside RTIs  Doravirine - susceptible Efavirenz - susceptible Etravirine - susceptible Nevirapine - susceptible Rilpivirine - susceptible   Protease Inhibitors  Atazanavir - susceptible Darunavir - susceptible Lopinavir - susceptible   Integrase Inhibitors  Bictegravir - Potential Low-Level Resistance Cabotegravir - Intermediate Resistance Dolutegravir - Potential Low-Level Resistance Elvitegravir -  High-Level Resistance Raltegravir -  High-Level Resistance   Blane Ohara, PharmD  PGY1 Pharmacy Resident

## 2023-03-10 ENCOUNTER — Other Ambulatory Visit: Payer: Self-pay | Admitting: Infectious Disease

## 2023-03-11 NOTE — Telephone Encounter (Signed)
Please advise on refill.

## 2023-03-11 NOTE — Telephone Encounter (Signed)
Spoke with patient who does not have a PCP. Provided number for [facility]. Would like to know if she can have one more refill until seen.

## 2023-03-31 ENCOUNTER — Other Ambulatory Visit: Payer: Self-pay | Admitting: Infectious Disease

## 2023-03-31 NOTE — Telephone Encounter (Signed)
Ok to refill 

## 2023-04-15 ENCOUNTER — Encounter: Payer: Self-pay | Admitting: Cardiovascular Disease

## 2023-04-15 NOTE — Progress Notes (Signed)
Appt cancelled This encounter was created in error - please disregard. 

## 2023-04-16 ENCOUNTER — Other Ambulatory Visit: Payer: Self-pay | Admitting: Infectious Disease

## 2023-04-18 ENCOUNTER — Telehealth: Payer: Self-pay

## 2023-04-18 NOTE — Telephone Encounter (Signed)
Called patient regarding refill request for Flexeril. Per Md patient will need to have PCP take over rx. Relayed this to patient who states she does not have a pcp. Provided her with number for [facility] to establish care. Juanita Laster, RMA

## 2023-04-18 NOTE — Telephone Encounter (Signed)
Please advise on refill or if PCP should fill

## 2023-04-19 ENCOUNTER — Ambulatory Visit: Payer: 59 | Admitting: Cardiovascular Disease

## 2023-05-13 ENCOUNTER — Other Ambulatory Visit: Payer: Self-pay | Admitting: Infectious Disease

## 2023-05-13 NOTE — Telephone Encounter (Signed)
Patient was advised to establish care with PCP for future refills on 6/17.  Sandie Ano, RN

## 2023-05-23 ENCOUNTER — Ambulatory Visit: Payer: 59 | Attending: Cardiovascular Disease | Admitting: Cardiovascular Disease

## 2023-05-23 ENCOUNTER — Encounter: Payer: Self-pay | Admitting: Cardiovascular Disease

## 2023-05-23 VITALS — BP 120/78 | HR 73 | Ht 65.0 in | Wt 232.0 lb

## 2023-05-23 DIAGNOSIS — I10 Essential (primary) hypertension: Secondary | ICD-10-CM | POA: Diagnosis not present

## 2023-05-23 NOTE — Patient Instructions (Signed)
Medication Instructions:  Your physician recommends that you continue on your current medications as directed. Please refer to the Current Medication list given to you today.  *If you need a refill on your cardiac medications before your next appointment, please call your pharmacy*  Lab Work: None ordered today.  Testing/Procedures: None ordered today.  Follow-Up: At [facility], you and your health needs are our priority.  As part of our continuing mission to provide you with exceptional heart care, we have created designated Provider Care Teams.  These Care Teams include your primary Cardiologist (physician) and Advanced Practice Providers (APPs -  Physician Assistants and Nurse Practitioners) who all work together to provide you with the care you need, when you need it.  Your next appointment:   1 year(s)  The format for your next appointment:   In Person  Provider:   Philip Nahser, MD { 

## 2023-05-23 NOTE — Progress Notes (Signed)
Cardiology Office Note:    Date:  05/23/2023   ID:  [name], DOB [birth year], MRN [id number]  PCP:  No primary care provider on file.   [facility] Providers Cardiologist:  Rashan Rounsaville   Referring MD: Verdell Face, DO   No chief complaint on file.   History of Present Illness:    [name] is a 57 y.o. female with a hx of  HIV disease, obesty, HTN,HLD and progressive dyspenea   We were asked to see her for worsening DOE  Has severe DOE with housework   Smoked for years.  Cocaine ( crack, snort ) off and on for 20 years No cocaine use since 2008 Still smokes marijuana regularly   She can hear wheezing at night when she goes to bed.    Occasional episodes of CP  Echocardiogram from March, 2022 shows normal left ventricular systolic function.  Normal diastolic function.  No significant valvular disease.  Had a mild stroke in March 2022 Was told she had carotid disease, was started on Atorva and plavix   No exercise per se,   we discussed exercise , weight loss    June , 2024  No show   July, 22, 2024 [name] is seen for follow up of her HTN Has been doing better with diet   Is still short of breath with exertion .  Does not get regular exercise  Echo in March, 2022 showed normal LV function   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   cyclobenzaprine (FLEXERIL) 10 MG tablet TAKE 1 TABLET BY MOUTH THREE TIMES A DAY AS NEEDED   Darunavir-Cobicistat-Emtricitabine-Tenofovir Alafenamide (SYMTUZA) 800-150-200-10 MG TABS Take 1 tablet by mouth daily with breakfast.   gabapentin (NEURONTIN) 300 MG capsule TAKE 1 CAPSULE BY MOUTH THREE TIMES A DAY   metoprolol succinate (TOPROL XL) 50 MG 24 hr tablet Take 1 tablet (50 mg total) by mouth daily. Take with or immediately following a meal.   omeprazole (PRILOSEC) 20 MG capsule TAKE 1 CAPSULE BY MOUTH EVERY DAY   rosuvastatin (CRESTOR) 20 MG tablet Take 1 tablet (20 mg total) by mouth daily.   valACYclovir (VALTREX) 1000 MG tablet  Take 1 tablet (1,000 mg total) by mouth daily.     Allergies:   Morphine and Doxycycline hyclate   Social History   Socioeconomic History   Marital status: Single    Spouse name: Not on file   Number of children: Not on file   Years of education: HS   Highest education level: Not on file  Occupational History   Occupation: disabled  Tobacco Use   Smoking status: Former    Types: Cigarettes   Smokeless tobacco: Never  Vaping Use   Vaping status: Never Used  Substance and Sexual Activity   Alcohol use: Yes    Comment: occasional   Drug use: Yes    Frequency: 14.0 times per week    Types: Marijuana    Comment: daily   Sexual activity: Not Currently    Comment: given condoms  Other Topics Concern   Not on file  Social History  Narrative   Not on file   Social Determinants of Health   Financial Resource Strain: Not on file  Food Insecurity: Not on file  Transportation Needs: Not on file  Physical Activity: Not on file  Stress: Not on file  Social Connections: Not on file     Family History: The patient's family history includes Diabetes type II in her brother. There is no history of Migraines.  ROS:   Please see the history of present illness.     All other systems reviewed and are negative.  EKGs/Labs/Other Studies Reviewed:    The following studies were reviewed today:   EKG:     Recent Labs: 02/02/2023: ALT 27; BUN 10; Creat 0.94; Hemoglobin 12.7; Platelets 491; Potassium 4.1; Sodium 141  Recent Lipid Panel    Component Value Date/Time   CHOL 235 (H) 02/02/2023 0944   TRIG 169 (H) 02/02/2023 0944   HDL 40 (L) 02/02/2023 0944   CHOLHDL 5.9 (H) 02/02/2023 0944   VLDL 33 01/06/2021 2137   LDLCALC 163 (H) 02/02/2023 0944   LDLDIRECT 163 (H) 09/13/2022 0917     Risk Assessment/Calculations:     Physical Exam:    Physical Exam: Blood pressure 120/78, pulse 73, height 5\' 5"  (1.651 m), weight 232 lb (105.2 kg), SpO2 98%.       GEN:  Well nourished, well developed in no acute distress HEENT: Normal NECK: No JVD; No carotid bruits LYMPHATICS: No lymphadenopathy CARDIAC: RRR , no murmurs, rubs, gallops RESPIRATORY:  Clear to auscultation without rales, wheezing or rhonchi  ABDOMEN: Soft, non-tender, non-distended MUSCULOSKELETAL:  No edema; No deformity  SKIN: Warm and dry NEUROLOGIC:  Alert and oriented x 3    ASSESSMENT:    1. Benign essential HTN     PLAN:       HTN:    BP is well controlled.   Cont meds   2. Palpitations:      3.   Dyspnea:   has DOE ,  needs to exercise   Will see her in 1 year             Medication Adjustments/Labs and Tests Ordered: Current medicines are reviewed at length with the patient today.  Concerns regarding medicines are  outlined above.  No orders of the defined types were placed in this encounter.  No orders of the defined types were placed in this encounter.   Patient Instructions  Medication Instructions:  Your physician recommends that you continue on your current medications as directed. Please refer to the Current Medication list  given to you today.  *If you need a refill on your cardiac medications before your next appointment, please call your pharmacy*  Lab Work: None ordered today.  Testing/Procedures: None ordered today.  Follow-Up: At [facility], you and your health needs are our priority.  As part of our continuing mission to provide you with exceptional heart care, we have created designated Provider Care Teams.  These Care Teams include your primary Cardiologist (physician) and Advanced Practice Providers (APPs -  Physician Assistants and Nurse Practitioners) who all work together to provide you with the care you need, when you need it.  Your next appointment:   1 year(s)  The format for your next appointment:   In Person  Provider:   Kristeen Miss, MD {   Signed, Kristeen Miss, MD  05/23/2023 6:45 PM    [facility]

## 2023-06-24 ENCOUNTER — Ambulatory Visit: Payer: 59 | Admitting: Cardiovascular Disease

## 2023-07-26 NOTE — Progress Notes (Unsigned)
Subjective:  Chief complaint:    Patient ID: [name], female    DOB: 1966/05/17, 57 y.o.   MRN: [id number]  HPI  [name] is a 57 year old woman living with HIV that has been relatively well controlled history of hypertension. hyperlipidemia TIA, palpitations, HSV, prior zoster, obesity who had failed failed Stribild with resistance though I cannot find the genotype showing    INSTI Major Mutations: N155H INSTI Accessory Mutations: None IN Other Mutations: S230N , D232E Integrase Strand Transfer Inhibitors bictegravir (BIC) Potential Low-Level Resistance cabotegravir (CAB) Low-Level Resistance dolutegravir (DTG) Potential Low-Level Resistance elvitegravir (EVG) High-Level Resistance raltegravir (RAL) High-Level Resistance IN comments Major N155H is a common nonpolymorphic INSTI-resistance mutations. It has been reported in a high proportion of persons developing VF and HIVDR while receiving RAL, EVG, DTG, and CAB. Alone, it reduces RAL and EVG susceptibility about 10 and 30-fold, respectively. It has minimal effect on susceptibility to DTG, BIC, and CAB. Other S230N is a polymorphism that is not associated with reduced INSTI susceptibility. This virus is predicted to have low-level reduced susceptibility to CAB. The use of the combination of CAB/RPV should be considered to be relatively contraindicated.  [name] is in good spirits her blood pressure is elevated because she did not take her medications again for come to clinic.  She did see Dr. Lourena Simmonds for her palpitations who gave her advice with regards to weight management.  She was  taking Plavix but also taking omeprazole which lowers the levels of this drug.  She was suffering from muscle spasms and pains in her legs and also in times in her flanks.    In looking over her fill history based on the miscellaneous dispensing report she does not appear to of filled any of her medications since June 2024 there also  appeared to be gaps in her filling pattern which would make sense with her history of having acquired multidrug-resistant HIV infection.  I asked her about this today.    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   Nonallopathic lesion of head 11/16/2017   Shingles    2000   Vaccine counseling 12/01/2021    Past Surgical History:  Procedure Laterality Date   ABDOMINAL HYSTERECTOMY  1993   Partial   CESAREAN SECTION     LAPAROSCOPIC ASSISTED VENTRAL HERNIA REPAIR N/A 01/02/2015   Procedure: LAPAROSCOPIC VENTRAL WALL HERNIA REPAIR;  Surgeon: Karie Soda, MD;  Location: [facility];  Service: General;  Laterality: N/A;  With MESH   TONSILLECTOMY  1988    Family History  Problem Relation Age of Onset   Diabetes type II Brother    Migraines Neg Hx       Social History   Socioeconomic History   Marital status: Single    Spouse name: Not on file   Number of children:  Not on file   Years of education: HS   Highest education level: Not on file  Occupational History   Occupation: disabled  Tobacco Use   Smoking status: Former    Types: Cigarettes   Smokeless tobacco: Never  Vaping Use   Vaping status: Never Used  Substance and Sexual Activity   Alcohol use: Yes    Comment: occasional   Drug use: Yes    Frequency: 14.0 times per week    Types: Marijuana    Comment: daily   Sexual activity: Not Currently    Comment: given  condoms  Other Topics Concern   Not on file  Social History Narrative   Not on file   Social Determinants of Health   Financial Resource Strain: Not on file  Food Insecurity: Not on file  Transportation Needs: Not on file  Physical Activity: Not on file  Stress: Not on file  Social Connections: Not on file    Allergies  Allergen Reactions   Morphine Other (See Comments)    Burning under the skin   Doxycycline Hyclate Rash     Current Outpatient Medications:    clopidogrel (PLAVIX) 75 MG tablet, Take 1 tablet (75 mg total) by mouth daily., Disp: 90 tablet, Rfl: 3   cyclobenzaprine (FLEXERIL) 10 MG tablet, TAKE 1 TABLET BY MOUTH THREE TIMES A DAY AS NEEDED, Disp: 90 tablet, Rfl: 0   Darunavir-Cobicistat-Emtricitabine-Tenofovir Alafenamide (SYMTUZA) 800-150-200-10 MG TABS, Take 1 tablet by mouth daily with breakfast., Disp: 30 tablet, Rfl: 11   gabapentin (NEURONTIN) 300 MG capsule, TAKE 1 CAPSULE BY MOUTH THREE TIMES A DAY, Disp: 270 capsule, Rfl: 4   metoprolol succinate (TOPROL XL) 50 MG 24 hr tablet, Take 1 tablet (50 mg total) by mouth daily. Take with or immediately following a meal., Disp: 90 tablet, Rfl: 4   omeprazole (PRILOSEC) 20 MG capsule, TAKE 1 CAPSULE BY MOUTH EVERY DAY, Disp: 90 capsule, Rfl: 1   rosuvastatin (CRESTOR) 20 MG tablet, Take 1 tablet (20 mg total) by mouth daily., Disp: 30 tablet, Rfl: 11   valACYclovir (VALTREX) 1000 MG tablet, Take 1 tablet (1,000 mg total) by mouth daily., Disp: 30 tablet, Rfl: 11   Review of Systems  Constitutional:  Negative for activity change, appetite change, chills, diaphoresis, fatigue, fever and unexpected weight change.  HENT:  Negative for congestion, rhinorrhea, sinus pressure, sneezing, sore throat and trouble swallowing.   Eyes:  Negative for photophobia and visual disturbance.  Respiratory:  Negative for cough, chest tightness, shortness of breath, wheezing and stridor.   Cardiovascular:  Negative for chest pain,  palpitations and leg swelling.  Gastrointestinal:  Negative for abdominal distention, abdominal pain, anal bleeding, blood in stool, constipation, diarrhea, nausea and vomiting.  Genitourinary:  Negative for difficulty urinating, dysuria, flank pain and hematuria.  Musculoskeletal:  Negative for arthralgias, back pain, gait problem, joint swelling and myalgias.  Skin:  Negative for color change, pallor, rash and wound.  Neurological:  Negative for dizziness, tremors, weakness and light-headedness.  Hematological:  Negative for adenopathy. Does not bruise/bleed easily.  Psychiatric/Behavioral:  Negative for agitation, behavioral problems, confusion, decreased concentration, dysphoric mood and sleep disturbance.        Objective:   Physical Exam Constitutional:      General: She is not in acute distress.    Appearance: Normal appearance. She is well-developed. She is not ill-appearing or diaphoretic.  HENT:     Head: Normocephalic and atraumatic.     Right Ear: Hearing and external  ear normal.     Left Ear: Hearing and external ear normal.     Nose: No nasal deformity or rhinorrhea.  Eyes:     General: No scleral icterus.    Conjunctiva/sclera: Conjunctivae normal.     Right eye: Right conjunctiva is not injected.     Left eye: Left conjunctiva is not injected.     Pupils: Pupils are equal, round, and reactive to light.  Neck:     Vascular: No JVD.  Cardiovascular:     Rate and Rhythm: Normal rate and regular rhythm.     Heart sounds: Normal heart sounds, S1 normal and S2 normal. No murmur heard.    No friction rub.  Abdominal:     General: Bowel sounds are normal. There is no distension.     Palpations: Abdomen is soft.     Tenderness: There is no abdominal tenderness.  Musculoskeletal:        General: Normal range of motion.     Right shoulder: Normal.     Left shoulder: Normal.     Cervical back: Normal range of motion and neck supple.     Right hip: Normal.     Left hip:  Normal.     Right knee: Normal.     Left knee: Normal.  Lymphadenopathy:     Head:     Right side of head: No submandibular, preauricular or posterior auricular adenopathy.     Left side of head: No submandibular, preauricular or posterior auricular adenopathy.     Cervical: No cervical adenopathy.     Right cervical: No superficial or deep cervical adenopathy.    Left cervical: No superficial or deep cervical adenopathy.  Skin:    General: Skin is warm and dry.     Coloration: Skin is not pale.     Findings: No abrasion, bruising, ecchymosis, erythema, lesion or rash.     Nails: There is no clubbing.  Neurological:     Mental Status: She is alert and oriented to person, place, and time.     Sensory: No sensory deficit.     Coordination: Coordination normal.     Gait: Gait normal.  Psychiatric:        Attention and Perception: She is attentive.        Speech: Speech normal.        Behavior: Behavior normal. Behavior is cooperative.        Thought Content: Thought content normal.        Judgment: Judgment normal.           Assessment & Plan:   HIV disease:  I will add order HIV viral load CD4 count CBC with differential CMP, RPR GC and chlamydia and I will continue  [facility] prescription  Hypertension: Continue metoprolol  History of TIA continue metoprolol Plavix and Crestor  Hyperlipidemia continue Crestor  Muscle spasms: Defer to PCP  Gastroesophageal reflux disease can continue omeprazole  Preventative medicine: Make sure that she has had referral for colonoscopy and also that she has had Pap smear.   Vaccine counseling recommended updated flu shot COVID-19 shot as well as DTaP today in clinic.  She also should get Shingrix vaccine

## 2023-07-27 ENCOUNTER — Encounter: Payer: Self-pay | Admitting: Infectious Disease

## 2023-07-27 ENCOUNTER — Other Ambulatory Visit (HOSPITAL_COMMUNITY): Payer: Self-pay

## 2023-07-27 ENCOUNTER — Other Ambulatory Visit: Payer: Self-pay

## 2023-07-27 ENCOUNTER — Ambulatory Visit (INDEPENDENT_AMBULATORY_CARE_PROVIDER_SITE_OTHER): Payer: 59 | Admitting: Infectious Disease

## 2023-07-27 ENCOUNTER — Other Ambulatory Visit (HOSPITAL_COMMUNITY)
Admission: RE | Admit: 2023-07-27 | Discharge: 2023-07-27 | Disposition: A | Discharge status: Home / Self Care | Payer: 59 | Source: Ambulatory Visit | Attending: Infectious Disease | Admitting: Infectious Disease

## 2023-07-27 VITALS — BP 157/95 | HR 73 | Resp 16 | Ht 65.0 in | Wt 233.3 lb

## 2023-07-27 DIAGNOSIS — B20 Human immunodeficiency virus [HIV] disease: Secondary | ICD-10-CM | POA: Insufficient documentation

## 2023-07-27 DIAGNOSIS — Z1211 Encounter for screening for malignant neoplasm of colon: Secondary | ICD-10-CM

## 2023-07-27 DIAGNOSIS — E782 Mixed hyperlipidemia: Secondary | ICD-10-CM | POA: Diagnosis not present

## 2023-07-27 DIAGNOSIS — Z7185 Encounter for immunization safety counseling: Secondary | ICD-10-CM

## 2023-07-27 DIAGNOSIS — Z23 Encounter for immunization: Secondary | ICD-10-CM

## 2023-07-27 DIAGNOSIS — I10 Essential (primary) hypertension: Secondary | ICD-10-CM

## 2023-07-27 DIAGNOSIS — G629 Polyneuropathy, unspecified: Secondary | ICD-10-CM

## 2023-07-27 DIAGNOSIS — G459 Transient cerebral ischemic attack, unspecified: Secondary | ICD-10-CM

## 2023-07-27 MED ORDER — GABAPENTIN 300 MG PO CAPS
ORAL_CAPSULE | ORAL | 4 refills | Status: DC
Start: 2023-07-27 — End: 2024-07-24

## 2023-07-27 MED ORDER — METOPROLOL SUCCINATE ER 50 MG PO TB24: 50.0000 mg | ORAL_TABLET | Freq: Every day | ORAL | 4 refills | Status: DC

## 2023-07-27 MED ORDER — ROSUVASTATIN CALCIUM 20 MG PO TABS: 20.0000 mg | ORAL_TABLET | Freq: Every day | ORAL | 11 refills | Status: DC

## 2023-07-27 MED ORDER — VALACYCLOVIR HCL 1 G PO TABS: 1000.0000 mg | ORAL_TABLET | Freq: Every day | ORAL | 11 refills | Status: DC

## 2023-07-27 MED ORDER — CLOPIDOGREL BISULFATE 75 MG PO TABS: 75.0000 mg | ORAL_TABLET | Freq: Every day | ORAL | 3 refills | Status: DC

## 2023-07-27 MED ORDER — SYMTUZA 800-150-200-10 MG PO TABS: 1.0000 | ORAL_TABLET | Freq: Every day | ORAL | 11 refills | Status: DC

## 2023-07-28 LAB — COMPLETE METABOLIC PANEL WITH GFR
AG Ratio: 1.5 (calc) (ref 1.0–2.5)
ALT: 27 U/L (ref 6–29)
AST: 18 U/L (ref 10–35)
Albumin: 4.4 g/dL (ref 3.6–5.1)
Alkaline phosphatase (APISO): 116 U/L (ref 37–153)
BUN/Creatinine Ratio: 12 (calc) (ref 6–22)
BUN: 12 mg/dL (ref 7–25)
CO2: 28 mmol/L (ref 20–32)
Calcium: 9.9 mg/dL (ref 8.6–10.4)
Chloride: 102 mmol/L (ref 98–110)
Creat: 1.04 mg/dL — ABNORMAL HIGH (ref 0.50–1.03)
Globulin: 3 g/dL (calc) (ref 1.9–3.7)
Glucose, Bld: 100 mg/dL — ABNORMAL HIGH (ref 65–99)
Potassium: 4.2 mmol/L (ref 3.5–5.3)
Sodium: 139 mmol/L (ref 135–146)
Total Bilirubin: 0.3 mg/dL (ref 0.2–1.2)
Total Protein: 7.4 g/dL (ref 6.1–8.1)
eGFR: 63 mL/min/{1.73_m2} (ref >=60)

## 2023-07-28 LAB — URINE CYTOLOGY ANCILLARY ONLY
Chlamydia: NEGATIVE
Comment: NEGATIVE
Comment: NORMAL
Neisseria Gonorrhea: NEGATIVE

## 2023-07-28 LAB — LIPID PANEL
Cholesterol: 253 mg/dL — ABNORMAL HIGH (ref <200)
HDL: 39 mg/dL — ABNORMAL LOW (ref >=50)
LDL Cholesterol (Calc): 176 mg/dL (calc) — ABNORMAL HIGH
Non-HDL Cholesterol (Calc): 214 mg/dL (calc) — ABNORMAL HIGH (ref <130)
Total CHOL/HDL Ratio: 6.5 (calc) — ABNORMAL HIGH (ref <5.0)
Triglycerides: 214 mg/dL — ABNORMAL HIGH (ref <150)

## 2023-07-29 LAB — SYPHILIS: RPR W/REFLEX TO RPR TITER AND TREPONEMAL ANTIBODIES, TRADITIONAL SCREENING AND DIAGNOSIS ALGORITHM: RPR Ser Ql: NONREACTIVE

## 2023-07-29 LAB — HEPATITIS C AB W/RFL RNA, PCR + GENO: Hepatitis C Ab: NONREACTIVE

## 2023-07-29 LAB — CBC WITH DIFFERENTIAL/PLATELET
Absolute Monocytes: 564 {cells}/uL (ref 200–950)
Basophils Absolute: 66 {cells}/uL (ref 0–200)
Basophils Relative: 0.8 %
Eosinophils Absolute: 282 {cells}/uL (ref 15–500)
Eosinophils Relative: 3.4 %
HCT: 39.5 % (ref 35.0–45.0)
Hemoglobin: 12.8 g/dL (ref 11.7–15.5)
Lymphs Abs: 3411 {cells}/uL (ref 850–3900)
MCH: 27.8 pg (ref 27.0–33.0)
MCHC: 32.4 g/dL (ref 32.0–36.0)
MCV: 85.9 fL (ref 80.0–100.0)
MPV: 10.4 fL (ref 7.5–12.5)
Monocytes Relative: 6.8 %
Neutro Abs: 3976 {cells}/uL (ref 1500–7800)
Neutrophils Relative %: 47.9 %
Platelets: 437 Thousand/uL — ABNORMAL HIGH (ref 140–400)
RBC: 4.6 Million/uL (ref 3.80–5.10)
RDW: 13.7 % (ref 11.0–15.0)
Total Lymphocyte: 41.1 %
WBC: 8.3 Thousand/uL (ref 3.8–10.8)

## 2023-07-29 LAB — T-HELPER CELLS (CD4) COUNT (NOT AT ARMC)
CD4 % Helper T Cell: 28 % — ABNORMAL LOW (ref 33–65)
CD4 T Cell Abs: 886 /uL (ref 400–1790)

## 2023-07-29 LAB — HIV-1 RNA QUANT-NO REFLEX-BLD
HIV 1 RNA Quant: NOT DETECTED {copies}/mL
HIV-1 RNA Quant, Log: NOT DETECTED {Log}

## 2023-08-03 ENCOUNTER — Ambulatory Visit: Payer: 59 | Admitting: Infectious Disease

## 2023-09-05 ENCOUNTER — Other Ambulatory Visit: Payer: Self-pay | Admitting: Infectious Disease

## 2023-12-07 ENCOUNTER — Ambulatory Visit (INDEPENDENT_AMBULATORY_CARE_PROVIDER_SITE_OTHER): Payer: 59 | Admitting: Gastroenterology

## 2023-12-07 ENCOUNTER — Encounter: Payer: Self-pay | Admitting: Gastroenterology

## 2023-12-07 VITALS — BP 150/88 | HR 80 | Ht 64.0 in | Wt 237.0 lb

## 2023-12-07 DIAGNOSIS — B20 Human immunodeficiency virus [HIV] disease: Secondary | ICD-10-CM

## 2023-12-07 DIAGNOSIS — Z7902 Long term (current) use of antithrombotics/antiplatelets: Secondary | ICD-10-CM | POA: Diagnosis not present

## 2023-12-07 DIAGNOSIS — Z8673 Personal history of transient ischemic attack (TIA), and cerebral infarction without residual deficits: Secondary | ICD-10-CM | POA: Diagnosis not present

## 2023-12-07 DIAGNOSIS — I679 Cerebrovascular disease, unspecified: Secondary | ICD-10-CM

## 2023-12-07 DIAGNOSIS — Z1211 Encounter for screening for malignant neoplasm of colon: Secondary | ICD-10-CM

## 2023-12-07 MED ORDER — SUFLAVE 178.7 G PO SOLR
1.0000 | Freq: Once | ORAL | 0 refills | Status: AC
Start: 2023-12-07 — End: 2023-12-07

## 2023-12-07 NOTE — Patient Instructions (Signed)
 You have been scheduled for a colonoscopy. Please follow written instructions given to you at your visit today.   If you use inhalers (even only as needed), please bring them with you on the day of your procedure.  DO NOT TAKE 7 DAYS PRIOR TO TEST- Trulicity (dulaglutide) Ozempic, Wegovy (semaglutide) Mounjaro (tirzepatide) Bydureon Bcise (exanatide extended release)  DO NOT TAKE 1 DAY PRIOR TO YOUR TEST Rybelsus (semaglutide) Adlyxin (lixisenatide) Victoza (liraglutide) Byetta (exanatide) ___________________________________________________________________________  [name] will receive your bowel preparation through [facility], which ensures the lowest copay and home delivery, with outreach via text or call from an [phone] number. Please respond promptly to avoid rescheduling of your procedure. If you are interested in alternative options or have any questions regarding your prep, please contact them at [phone] ____________________________________________________________________________  Your Provider Has Sent Your Bowel Prep Regimen To [facility]   [facility] will contact you to verify your information and collect your copay, if applicable. Enjoy the comfort of your home while your prescription is mailed to you, FREE of any shipping charges.   [facility] accepts all major insurance benefits and applies discounts & coupons.  Have additional questions?   Chat: [email or web address] Call: [phone] Email: [email or web address] NCPDP: [id number]  How will [facility] contact you?  With a Welcome phone call,  a Welcome text and a checkout link in text form.  Texts you receive from [phone] Are NOT Spam.  *To set up delivery, you must complete the checkout process via link or speak to one of the patient care representatives. If [facility] is unable to reach you, your prescription may be delayed.  To avoid long hold times on the phone, you may also utilize the secure chat  feature on the [facility] website to request that they call you back for transaction completion or to expedite your concerns.   _______________________________________________________  If your blood pressure at your visit was 140/90 or greater, please contact your primary care physician to follow up on this.  _______________________________________________________  If you are age 50 or older, your body mass index should be between 23-30. Your Body mass index is 40.68 kg/m. If this is out of the aforementioned range listed, please consider follow up with your Primary Care Provider.  If you are age 60 or younger, your body mass index should be between 19-25. Your Body mass index is 40.68 kg/m. If this is out of the aformentioned range listed, please consider follow up with your Primary Care Provider.   ________________________________________________________  The [facility] providers would like to encourage you to use MYCHART to communicate with providers for non-urgent requests or questions.  Due to long hold times on the telephone, sending your provider a message by [employer] may be a faster and more efficient way to get a response.  Please allow 48 business hours for a response.  Please remember that this is for non-urgent requests.  _______________________________________________________ It was a pleasure to see you today!  Thank you for trusting me with your gastrointestinal care!

## 2023-12-07 NOTE — Progress Notes (Signed)
 Discussed the use of AI scribe software for clinical note transcription with the patient, who gave verbal consent to proceed.  HPI : [name] is a 58 y.o. female with a history of TIA, obesity who is referred to us  by Fleeta Rothman, Jomarie SAILOR, MD for colon cancer screening. She has never had a colonoscopy before, although she has used Cologuard for screening in the past, most recently 2 years ago.  There is no family history of colon cancer, and she has no concerning gastrointestinal symptoms such as blood in the stool or changes in bowel habits. She attributes her regular bowel movements to her HIV medication regimen.  She has a history of HIV, diagnosed in 2005, and has been on antiretroviral therapy since then. Initially, she was on a regimen of approximately 20 pills a day, but now she takes one pill daily. She experienced a period of non-adherence to her medication in 2012 due to depression following the loss of her only child. Since 2013, she has been adherent to her medication regimen. She experiences cramps, which she attributes to a side effect of her medication, particularly when she does not drink enough water .  She experienced a TIA in March 2022.  Her symptoms consisted of visual-spatial deficits, and was preceded by symptoms such as numbness in her hand and tingling in her feet.  She denies any permanent neurologic deficits.  She was a smoker for 30 years but quit after the stroke in 2022.  Following the stroke, she was prescribed a high dose of aspirin  and Plavix , but has since transition to Plavix  monotherapy (discharge summary indicated intention to transition to aspirin  monotherapy). She has not had any follow-up with a neurologist since her hospitalization for the stroke. No recurrent stroke symptoms.  Her weight has been stable between 225 and 240 pounds over the last six years. She walks more in the spring and has a diet that includes occasional indulgences in high-calorie foods  during holidays.      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 CTA head showing occlusion of right P2/PCA segment with reconstitution at the distal P2 segment and focal moderate stenosis at the origin of a right M2/MCA posterior division branch with poststenotic fusiform dilatation and increased tortuosity of this branch. ECHO showing EF 55-60%, no LA dilation, no ASD/PFO seen. As per SAMPPRIS trial, will discharge with ASA 325mg , plavix  75mg , and lipitor  80mg  for 3 months followed by ASA monotherapy. Also with plan for outpatient follow up in 6 weeks at [facility] with Dr. 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   Nonallopathic lesion of head 11/16/2017   Shingles    2000   Vaccine counseling 12/01/2021     Past Surgical History:  Procedure  Laterality Date   ABDOMINAL HYSTERECTOMY  1993   Partial   CESAREAN SECTION     LAPAROSCOPIC ASSISTED VENTRAL HERNIA REPAIR N/A 01/02/2015   Procedure: LAPAROSCOPIC VENTRAL WALL HERNIA REPAIR;  Surgeon: Elspeth Schultze, MD;  Location: [facility];  Service: General;  Laterality: N/A;  With MESH   TONSILLECTOMY  1988   Family History  Problem Relation Age of Onset   Diabetes type II Brother    Migraines Neg Hx    Social History   Tobacco Use   Smoking status: Former    Types: Cigarettes   Smokeless tobacco:  Never  Vaping Use   Vaping status: Never Used  Substance Use Topics   Alcohol use: Yes    Comment: occasional   Drug use: Yes    Frequency: 14.0 times per week    Types: Marijuana    Comment: daily   Current Outpatient Medications  Medication Sig Dispense Refill   clopidogrel  (PLAVIX ) 75 MG tablet Take 1 tablet (75 mg total) by mouth daily. 90 tablet 3   cyclobenzaprine  (FLEXERIL ) 10 MG tablet TAKE 1 TABLET BY MOUTH THREE TIMES A DAY AS NEEDED 90 tablet 0   Darunavir -Cobicistat -Emtricitabine -Tenofovir  Alafenamide (SYMTUZA ) 800-150-200-10 MG TABS Take 1 tablet by mouth daily with breakfast. 30 tablet 11   gabapentin  (NEURONTIN ) 300 MG capsule TAKE 1 CAPSULE BY MOUTH THREE TIMES A DAY 270 capsule 4   metoprolol  succinate (TOPROL  XL) 50 MG 24 hr tablet Take 1 tablet (50 mg total) by mouth daily. Take with or immediately following a meal. 90 tablet 4   omeprazole  (PRILOSEC) 20 MG capsule TAKE 1 CAPSULE BY MOUTH EVERY DAY 90 capsule 1   rosuvastatin  (CRESTOR ) 20 MG tablet Take 1 tablet (20 mg total) by mouth daily. 30 tablet 11   valACYclovir  (VALTREX ) 1000 MG tablet Take 1 tablet (1,000 mg total) by mouth daily. 30 tablet 11   No current facility-administered medications for this visit.   Allergies  Allergen Reactions   Morphine Other (See Comments)    Burning under the skin   Doxycycline Hyclate Rash     Review of Systems: All systems reviewed and negative except where noted in HPI.    No results found.  Physical Exam: BP (!) 150/88   Pulse 80   Ht 5' 4 (1.626 m)   Wt 237 lb (107.5 kg)   BMI 40.68 kg/m  Constitutional: Pleasant,well-developed, African American female in no acute distress. HEENT: Normocephalic and atraumatic. Conjunctivae are normal. No scleral icterus. Neck supple.  Cardiovascular: Normal rate, regular rhythm.  Pulmonary/chest: Effort normal and breath sounds normal. No wheezing, rales or rhonchi. Abdominal: Soft, nondistended, nontender. Bowel sounds  active throughout. There are no masses palpable. No hepatomegaly. Extremities: no edema Neurological: Alert and oriented to person place and time. Skin: Skin is warm and dry. No rashes noted. Psychiatric: Normal mood and affect. Behavior is normal.  CBC    Component Value Date/Time   WBC 8.3 07/27/2023 1020   RBC 4.60 07/27/2023 1020   HGB 12.8 07/27/2023 1020   HGB 13.0 02/22/2022 0930   HCT 39.5 07/27/2023 1020   HCT 41.6 02/22/2022 0930   PLT 437 (H) 07/27/2023 1020   PLT 486 (H) 02/22/2022 0930   MCV 85.9 07/27/2023 1020   MCV 89 02/22/2022 0930   MCH 27.8 07/27/2023 1020   MCHC 32.4 07/27/2023 1020   RDW 13.7 07/27/2023 1020   RDW 13.5 02/22/2022 0930   LYMPHSABS 3,411 07/27/2023 1020   LYMPHSABS 3.3 (H) 02/22/2022  0930   MONOABS 0.8 09/16/2021 0915   EOSABS 282 07/27/2023 1020   EOSABS 0.6 (H) 02/22/2022 0930   BASOSABS 66 07/27/2023 1020   BASOSABS 0.1 02/22/2022 0930    CMP     Component Value Date/Time   NA 139 07/27/2023 1020   K 4.2 07/27/2023 1020   CL 102 07/27/2023 1020   CO2 28 07/27/2023 1020   GLUCOSE 100 (H) 07/27/2023 1020   BUN 12 07/27/2023 1020   CREATININE 1.04 (H) 07/27/2023 1020   CALCIUM  9.9 07/27/2023 1020   PROT 7.4 07/27/2023 1020   ALBUMIN 4.3 09/16/2021 0915   AST 18 07/27/2023 1020   ALT 27 07/27/2023 1020   ALKPHOS 93 09/16/2021 0915   BILITOT 0.3 07/27/2023 1020   GFRNONAA >60 12/21/2021 1407   GFRNONAA 53 (L) 01/19/2018 1525   GFRAA 61 01/19/2018 1525       Latest Ref Rng & Units 07/27/2023   10:20 AM 02/02/2023    9:44 AM 09/13/2022    9:17 AM  CBC EXTENDED  WBC 3.8 - 10.8 Thousand/uL 8.3  9.8  8.6   RBC 3.80 - 5.10 Million/uL 4.60  4.60  4.44   Hemoglobin 11.7 - 15.5 g/dL 87.1  87.2  87.7   HCT 35.0 - 45.0 % 39.5  38.9  37.8   Platelets 140 - 400 Thousand/uL 437  491  445   NEUT# 1,500 - 7,800 cells/uL 3,976  5,243  3,414   Lymph# 850 - 3,900 cells/uL 3,411  3,655.4  3,922       ASSESSMENT AND  PLAN:  58 year old female with history of TIA, HIV and obesity here for colon cancer screening.  She had previously used Cologuard, but she now wants to undergo a colonoscopy.  Colon Cancer Screening Due for colon cancer screening. Previously used Cologuard but never had a colonoscopy. No family history of colon cancer or concerning gastrointestinal symptoms. Prefers colonoscopy for thorough evaluation. Discussed procedure, including use of a fiber optic camera to inspect and remove polyps, sedation with propofol , and risks such as bleeding and sedation-related complications, to include increased risk of stroke given potential changes in cerebral blood flow.  - Schedule colonoscopy - Hold Plavix  for 5 days prior to procedure - Continue baby aspirin  during the 5 days off Plavix   History of TIA/high-grade PCA stenosis, asymptomatic Had a mild stroke a few years ago and has been on Plavix  since then. Discharge summary indicated a plan to switch to aspirin  after 3 months, but has continued on Plavix  without neurologist follow-up. Discussed importance of neurologist reassessment for Plavix  versus aspirin .   She had a high-grade stenosis of a PCA MRI but has not had any symptoms for almost 3 years.  I think that she is an acceptable risk for a sedated procedure.   I recommended the patient take a baby aspirin  during the 5 days that she is off Plavix .  Taking a low-dose aspirin  will not increase her risk for post polypectomy bleeding and will also continue to provide a level of stroke prevention. - Recommend follow-up with neurologist - Consider switching from Plavix  to aspirin  as per discharge summary - Recommend taking aspirin  81 mg while off Plavix   HIV On HIV treatment for 20 years with good adherence and no recent issues. - Continue current HIV medication regimen per Dr. Van Dam  Namita Yearwood E. Stacia, MD [facility]    Fleeta Rothman, Jomarie SAILOR, MD

## 2023-12-08 ENCOUNTER — Telehealth: Payer: Self-pay

## 2023-12-08 NOTE — Telephone Encounter (Signed)
 I can clear her from an IT standpoint, but she needs to see a dedicated internist to get her general medical clearance

## 2023-12-08 NOTE — Telephone Encounter (Signed)
 [facility] Pre-operative Risk Assessment     Request for surgical clearance:     Endoscopy Procedure  What type of surgery is being performed?     Colonoscopy   When is this surgery scheduled?     01/19/2024  What type of clearance is required ?   Pharmacy  Are there any medications that need to be held prior to surgery and how long? Plavix-5 day  Practice name and name of physician performing surgery?      [facility]  What is your office phone and fax number?      Phone- [phone]  Fax- [phone]  Anesthesia type (None, local, MAC, general) ?       MAC     Please route your response to Ahliya Glatt

## 2023-12-19 ENCOUNTER — Telehealth: Payer: Self-pay

## 2023-12-19 NOTE — Telephone Encounter (Signed)
[facility] Pre-operative Risk Assessment     Request for surgical clearance:     Endoscopy Procedure  What type of surgery is being performed?     Colonoscopy   When is this surgery scheduled?     01/19/2024  What type of clearance is required ?   Pharmacy  Are there any medications that need to be held prior to surgery and how long? Plavix-5 day  Practice name and name of physician performing surgery?      [facility]  What is your office phone and fax number?      Phone- [phone]  Fax- [phone]  Anesthesia type (None, local, MAC, general) ?       MAC     Please route your response to Ahliya Glatt

## 2023-12-19 NOTE — Telephone Encounter (Signed)
   Name: [name]  DOB: 07/09/1966  MRN: [id number]   Primary Cardiologist: Kristeen Miss, MD  Chart reviewed as part of pre-operative protocol coverage. [name] is prescribed Plavix by a noncardiology provider therefore recommendations for holding deferred to prescribing provider.    I will route this recommendation to the requesting party via Epic fax function and remove from pre-op pool. Please call with questions.  Carlos Levering, NP 12/19/2023, 4:39 PM

## 2023-12-28 ENCOUNTER — Other Ambulatory Visit: Payer: Self-pay | Admitting: Infectious Disease

## 2023-12-28 DIAGNOSIS — I639 Cerebral infarction, unspecified: Secondary | ICD-10-CM

## 2024-01-02 ENCOUNTER — Encounter: Payer: Self-pay | Admitting: Neurology

## 2024-01-02 ENCOUNTER — Ambulatory Visit (INDEPENDENT_AMBULATORY_CARE_PROVIDER_SITE_OTHER): Payer: 59 | Admitting: Neurology

## 2024-01-02 VITALS — BP 151/81 | HR 75 | Ht 64.0 in | Wt 234.0 lb

## 2024-01-02 DIAGNOSIS — G459 Transient cerebral ischemic attack, unspecified: Secondary | ICD-10-CM | POA: Diagnosis not present

## 2024-01-02 DIAGNOSIS — I10 Essential (primary) hypertension: Secondary | ICD-10-CM

## 2024-01-02 NOTE — Patient Instructions (Addendum)
 Continue current medications In terms of her colonoscopy, there is a small but acceptable risk of perioperative TIA/stroke while being off Plavix for total of 5 days Please provide clearance form to complete Please set up care with PCP Return as needed.

## 2024-01-02 NOTE — Progress Notes (Signed)
 [facility]  PATIENT: [name] DOB: 1966/03/08  REQUESTING CLINICIAN: Daiva Eves, Lisette Grinder, MD HISTORY FROM: Patient  REASON FOR VISIT: TIA    HISTORICAL  CHIEF COMPLAINT:  Chief Complaint  Patient presents with   New Patient (Initial Visit)    Pt in 13, here alone  Pt is referred by Dr Algis Liming for hx of CVA.     HISTORY OF PRESENT ILLNESS:  This is a 58 year old woman past medical history hypertension, hyperlipidemia, HIV, obesity, TIA in March 2022 who is presenting to establish care.  Patient reports at that time she presented due to visual disturbance and right-sided numbness.  Her MRI brain was negative for any acute stroke but MRA did show intracranial atherosclerosis.  She was discharged on DAPT and statin.  Currently she is on Plavix and Crestor.  Patient is set up for colonoscopy in March 2024.  She reports since the stroke like symptoms, she has been doing well, has not had any additional episodes.  She reports compliance with her medications. Currently she does not have a PCP.    OTHER MEDICAL CONDITIONS: Hypertension, hyperlipidemia, prediabetes, obesity, HIV, TIA 2022   REVIEW OF SYSTEMS: Full 14 system review of systems performed and negative with exception of: As noted in HPI  ALLERGIES: Allergies  Allergen Reactions   Morphine Other (See Comments)    Burning under the skin   Doxycycline Hyclate Rash    HOME MEDICATIONS: Outpatient Medications Prior to Visit  Medication Sig Dispense Refill   clopidogrel (PLAVIX) 75 MG tablet Take 1 tablet (75 mg total) by mouth daily. 90 tablet 3   cyclobenzaprine (FLEXERIL) 10 MG tablet TAKE 1 TABLET BY MOUTH THREE TIMES A DAY AS NEEDED 90 tablet 0   Darunavir-Cobicistat-Emtricitabine-Tenofovir Alafenamide (SYMTUZA) 800-150-200-10 MG TABS Take 1 tablet by mouth daily with breakfast. 30 tablet 11   gabapentin (NEURONTIN) 300 MG capsule TAKE 1 CAPSULE BY MOUTH THREE TIMES A DAY 270 capsule 4    metoprolol succinate (TOPROL XL) 50 MG 24 hr tablet Take 1 tablet (50 mg total) by mouth daily. Take with or immediately following a meal. 90 tablet 4   omeprazole (PRILOSEC) 20 MG capsule TAKE 1 CAPSULE BY MOUTH EVERY DAY 90 capsule 1   rosuvastatin (CRESTOR) 20 MG tablet Take 1 tablet (20 mg total) by mouth daily. 30 tablet 11   valACYclovir (VALTREX) 1000 MG tablet Take 1 tablet (1,000 mg total) by mouth daily. 30 tablet 11   No facility-administered medications prior to visit.    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   Nonallopathic lesion of head 11/16/2017   Shingles    2000   Stroke [facility])    Vaccine counseling 12/01/2021    PAST SURGICAL HISTORY: Past Surgical History:  Procedure Laterality Date   ABDOMINAL  HYSTERECTOMY  1993   Partial   CESAREAN SECTION     LAPAROSCOPIC ASSISTED VENTRAL HERNIA REPAIR N/A 01/02/2015   Procedure: LAPAROSCOPIC VENTRAL WALL HERNIA REPAIR;  Surgeon: Karie Soda, MD;  Location: [facility];  Service: General;  Laterality: N/A;  With MESH   TONSILLECTOMY  1988    FAMILY HISTORY: Family History  Problem Relation Age of Onset   Diabetes type II Brother    Liver cancer Neg Hx    Colon cancer Neg Hx    Esophageal cancer Neg Hx     SOCIAL HISTORY: Social History   Socioeconomic History   Marital status: Single    Spouse name: Not on file   Number of children: Not on file    Years of education: HS   Highest education level: Not on file  Occupational History   Occupation: disabled  Tobacco Use   Smoking status: Former    Types: Cigarettes   Smokeless tobacco: Never  Vaping Use   Vaping status: Never Used  Substance and Sexual Activity   Alcohol use: Yes    Comment: occasional   Drug use: Yes    Frequency: 14.0 times per week    Types: Marijuana    Comment: daily   Sexual activity: Not Currently    Comment: given condoms  Other Topics Concern   Not on file  Social History Narrative   Not on file   Social Drivers of Health   Financial Resource Strain: Not on file  Food Insecurity: Not on file  Transportation Needs: Not on file  Physical Activity: Not on file  Stress: Not on file  Social Connections: Not on file  Intimate Partner Violence: Not on file     PHYSICAL EXAM  GENERAL EXAM/CONSTITUTIONAL: Vitals:  Vitals:   01/02/24 1315 01/02/24 1345  BP: (!) 161/91 (!) 151/81  Pulse: 81 75  Weight: 234 lb (106.1 kg)   Height: 5\' 4"  (1.626 m)    Body mass index is 40.17 kg/m. Wt Readings from Last 3 Encounters:  01/02/24 234 lb (106.1 kg)  12/07/23 237 lb (107.5 kg)  07/27/23 233 lb 4.8 oz (105.8 kg)   Patient is in no distress; well developed, nourished and groomed; neck is supple  MUSCULOSKELETAL: Gait, strength, tone, movements noted in Neurologic exam below  NEUROLOGIC: MENTAL STATUS:      No data to display         awake, alert, oriented to person, place and time recent and remote memory intact normal attention and concentration language fluent, comprehension intact, naming intact fund of knowledge appropriate  CRANIAL NERVE:  2nd, 3rd, 4th, 6th - Visual fields full to confrontation, extraocular muscles intact, no nystagmus 5th - facial sensation symmetric 7th - facial strength symmetric 8th - hearing intact 9th - palate elevates symmetrically, uvula midline 11th - shoulder shrug symmetric 12th - tongue  protrusion midline  MOTOR:  normal bulk and tone, full strength in the BUE, BLE  SENSORY:  normal and symmetric to light touch  COORDINATION:  finger-nose-finger, fine finger movements normal  REFLEXES:  deep tendon reflexes present and symmetric  GAIT/STATION:  normal     DIAGNOSTIC DATA (LABS, IMAGING, TESTING) - I reviewed patient records, labs, notes, testing and imaging myself where available.  Lab Results  Component Value Date   WBC 8.3 07/27/2023   HGB 12.8 07/27/2023   HCT 39.5 07/27/2023   MCV 85.9 07/27/2023   PLT 437 (H) 07/27/2023      Component Value Date/Time  NA 139 07/27/2023 1020   K 4.2 07/27/2023 1020   CL 102 07/27/2023 1020   CO2 28 07/27/2023 1020   GLUCOSE 100 (H) 07/27/2023 1020   BUN 12 07/27/2023 1020   CREATININE 1.04 (H) 07/27/2023 1020   CALCIUM 9.9 07/27/2023 1020   PROT 7.4 07/27/2023 1020   ALBUMIN 4.3 09/16/2021 0915   AST 18 07/27/2023 1020   ALT 27 07/27/2023 1020   ALKPHOS 93 09/16/2021 0915   BILITOT 0.3 07/27/2023 1020   GFRNONAA >60 12/21/2021 1407   GFRNONAA 53 (L) 01/19/2018 1525   GFRAA 61 01/19/2018 1525   Lab Results  Component Value Date   CHOL 253 (H) 07/27/2023   HDL 39 (L) 07/27/2023   LDLCALC 176 (H) 07/27/2023   LDLDIRECT 163 (H) 09/13/2022   TRIG 214 (H) 07/27/2023   CHOLHDL 6.5 (H) 07/27/2023   Lab Results  Component Value Date   HGBA1C 6.2 (H) 02/22/2022   No results found for: "VITAMINB12" Lab Results  Component Value Date   TSH 0.866 04/19/2012   MRI/MRA head 01/06/2021 No acute infarction, hemorrhage, or mass. Mild chronic microvascular ischemic changes. Segmental occlusion or high-grade stenosis of the proximal right P2 PCA. No occlusion or hemodynamically significant stenosis in the neck.    ASSESSMENT AND PLAN  58 y.o. year old female with medical condition including hypertension, hyperlipidemia, prediabetes, HIV, TIA in 2022 who is presenting to establish care.  She has been doing  well from her strokelike symptoms, has not had an additional event.  She report compliance with her medication.  Plan for patient is to continue current medication.  In terms of her colonoscopy scheduled in March, there is a small but acceptable risk of peri-operative TIA/Stroke while being off Plavix for a total of 5 days. Please provide clearance letter to complete.     1. TIA (transient ischemic attack)   2. Primary hypertension      Patient Instructions  Continue current medications In terms of her colonoscopy, there is a small but acceptable risk of perioperative TIA/stroke while being off Plavix for total of 5 days Please provide clearance form to complete Please set up care with PCP Return as needed.  No orders of the defined types were placed in this encounter.   No orders of the defined types were placed in this encounter.   Return if symptoms worsen or fail to improve.    Windell Norfolk, MD 01/02/2024, 1:50 PM  [facility] [address] [place], [place] [address] [phone]

## 2024-01-03 ENCOUNTER — Telehealth: Payer: Self-pay

## 2024-01-03 NOTE — Telephone Encounter (Signed)
 Patient called and stated that she received a phone call but not sure from who. Patient is requesting a call back. Please advise.

## 2024-01-03 NOTE — Telephone Encounter (Signed)
 Called and left voicemail for patient- hold plavix for 5 days

## 2024-01-05 ENCOUNTER — Telehealth: Payer: Self-pay

## 2024-01-05 NOTE — Telephone Encounter (Signed)
  [name] February 27, 1966 [id number]  @DATE @   Dear [name]:  We have scheduled the above named patient for a(n) Colonoscopy procedure. Our records show that (s)he is on anticoagulation therapy.  Please advise as to whether the patient may come off their therapy of Plavix  5 days prior to their procedure which is scheduled for 01/19/24.  Please route your response to [facility] or fax response to [phone].  Sincerely,    [facility]

## 2024-01-06 ENCOUNTER — Encounter: Payer: Self-pay | Admitting: Neurology

## 2024-01-06 NOTE — Telephone Encounter (Signed)
 Letter sent via Epic. Thanks

## 2024-01-10 NOTE — Telephone Encounter (Signed)
 Spoke to patient and clarified that patient understands to hold her Plavix for 5 days prior to her procedure.  Patient acknowledged and understood.

## 2024-01-11 ENCOUNTER — Encounter: Payer: Self-pay | Admitting: Gastroenterology

## 2024-01-19 ENCOUNTER — Encounter: Payer: Self-pay | Admitting: Gastroenterology

## 2024-01-19 ENCOUNTER — Ambulatory Visit: Payer: 59 | Admitting: Gastroenterology

## 2024-01-19 VITALS — BP 164/88 | HR 74 | Temp 97.2°F | Resp 15 | Ht 64.0 in | Wt 237.0 lb

## 2024-01-19 DIAGNOSIS — K621 Rectal polyp: Secondary | ICD-10-CM

## 2024-01-19 DIAGNOSIS — K5730 Diverticulosis of large intestine without perforation or abscess without bleeding: Secondary | ICD-10-CM

## 2024-01-19 DIAGNOSIS — Z1211 Encounter for screening for malignant neoplasm of colon: Secondary | ICD-10-CM

## 2024-01-19 DIAGNOSIS — D122 Benign neoplasm of ascending colon: Secondary | ICD-10-CM

## 2024-01-19 DIAGNOSIS — D128 Benign neoplasm of rectum: Secondary | ICD-10-CM

## 2024-01-19 DIAGNOSIS — K644 Residual hemorrhoidal skin tags: Secondary | ICD-10-CM

## 2024-01-19 MED ORDER — SODIUM CHLORIDE 0.9 % IV SOLN: 500.0000 mL | INTRAVENOUS | Status: DC

## 2024-01-19 NOTE — Progress Notes (Signed)
 [facility] History and Physical   Primary Care Physician:  Pcp, No   Reason for Procedure:   Colon cancer screening  Plan:    Screening colonoscopy     HPI: [name] is a 58 y.o. female undergoing initial average risk screening colonoscopy.  She has no family history of colon cancer and no chronic GI symptoms.  She takes Plavix for a history of stroke/TIA, last dose Mar 10.   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 Authorizing Provider  cyclobenzaprine (FLEXERIL) 10 MG tablet TAKE 1 TABLET BY MOUTH THREE TIMES A DAY AS NEEDED 04/18/23  Yes Daiva Eves, Lisette Grinder, MD  cycloSPORINE (RESTASIS) 0.05 % ophthalmic emulsion Apply to eye. 01/05/24  Yes [provider]  Darunavir-Cobicistat-Emtricitabine-Tenofovir Alafenamide [facility]) 800-150-200-10 MG TABS Take 1 tablet by mouth  daily with breakfast. 07/27/23  Yes Daiva Eves, Lisette Grinder, MD  metoprolol succinate (TOPROL XL) 50 MG 24 hr tablet Take 1 tablet (50 mg total) by mouth daily. Take with or immediately following a meal. 07/27/23  Yes Daiva Eves, Lisette Grinder, MD  omeprazole (PRILOSEC) 20 MG capsule TAKE 1 CAPSULE BY MOUTH EVERY DAY 09/05/23  Yes Daiva Eves, Lisette Grinder, MD  rosuvastatin (CRESTOR) 20 MG tablet Take 1 tablet (20 mg total) by mouth daily. 07/27/23  Yes Daiva Eves, Lisette Grinder, MD  valACYclovir (VALTREX) 1000 MG tablet Take 1 tablet (1,000 mg total) by mouth daily. 07/27/23  Yes Daiva Eves, Lisette Grinder, MD  clopidogrel (PLAVIX) 75 MG tablet Take 1 tablet (75 mg total) by mouth daily. 07/27/23   Randall Hiss, MD  gabapentin (NEURONTIN) 300 MG capsule TAKE 1 CAPSULE BY MOUTH THREE TIMES A DAY 07/27/23   Daiva Eves, Lisette Grinder, MD  pantoprazole sodium (PROTONIX) 40 mg/20 mL PACK Place 20 mLs (40 mg total) into feeding tube daily at 12 noon. 01/05/12 01/10/12  Randall Hiss, MD    Current Outpatient Medications  Medication Sig Dispense Refill   cyclobenzaprine (FLEXERIL) 10 MG tablet TAKE 1 TABLET BY MOUTH THREE TIMES A DAY AS NEEDED 90 tablet 0   cycloSPORINE (RESTASIS) 0.05 % ophthalmic emulsion Apply to eye.     Darunavir-Cobicistat-Emtricitabine-Tenofovir Alafenamide (SYMTUZA) 800-150-200-10 MG  TABS Take 1 tablet by mouth daily with breakfast. 30 tablet 11   metoprolol succinate (TOPROL XL) 50 MG 24 hr tablet Take 1 tablet (50 mg total) by mouth daily. Take with or immediately following a meal. 90 tablet 4   omeprazole (PRILOSEC) 20 MG capsule TAKE 1 CAPSULE BY MOUTH EVERY DAY 90 capsule 1   rosuvastatin (CRESTOR) 20 MG tablet Take 1 tablet (20 mg total) by mouth daily. 30 tablet 11   valACYclovir (VALTREX) 1000 MG tablet Take 1 tablet (1,000 mg total) by mouth daily. 30 tablet 11   clopidogrel (PLAVIX) 75 MG tablet Take 1 tablet (75 mg total) by mouth daily. 90 tablet 3   gabapentin (NEURONTIN) 300 MG capsule  TAKE 1 CAPSULE BY MOUTH THREE TIMES A DAY 270 capsule 4   Current Facility-Administered Medications  Medication Dose Route Frequency Provider Last Rate Last Admin   0.9 %  sodium chloride infusion  500 mL Intravenous Continuous Jenel Lucks, MD        Allergies as of 01/19/2024 - Review Complete 01/19/2024  Allergen Reaction Noted   Morphine Other (See Comments) 01/30/2007   Doxycycline hyclate Rash 09/18/2009    Family History  Problem Relation Age of Onset   Diabetes type II Brother    Liver cancer Neg Hx    Colon cancer Neg Hx    Esophageal cancer Neg Hx     Social History   Socioeconomic History   Marital status: Single    Spouse name: Not on file   Number of children: Not on file   Years of education: HS   Highest education level: Not on file  Occupational History   Occupation: disabled  Tobacco Use   Smoking status: Former    Types: Cigarettes   Smokeless tobacco: Never  Vaping Use   Vaping status: Never Used  Substance and Sexual Activity   Alcohol use: Yes    Comment: occasional   Drug use: Yes    Frequency: 14.0 times per week    Types: Marijuana    Comment: daily   Sexual activity: Not Currently    Comment: given condoms  Other Topics Concern   Not on file  Social History Narrative   Not on file   Social Drivers of Health   Financial Resource Strain: Not on file  Food Insecurity: Not on file  Transportation Needs: Not on file  Physical Activity: Not on file  Stress: Not on file  Social Connections: Not on file  Intimate Partner Violence: Not on file    Review of Systems:  All other review of systems negative except as mentioned in the HPI.  Physical Exam: Vital signs BP (!) 177/113   Pulse 86   Temp (!) 97.2 F (36.2 C) (Temporal)   Ht 5\' 4"  (1.626 m)   Wt 237 lb (107.5 kg)   SpO2 98%   BMI 40.68 kg/m   General:   Alert,  Well-developed, well-nourished, pleasant and cooperative in NAD Airway:  Mallampati 2 Lungs:  Clear  throughout to auscultation.   Heart:  Regular rate and rhythm; no murmurs, clicks, rubs,  or gallops. Abdomen:  Soft, nontender and nondistended. Normal bowel sounds.   Neuro/Psych:  Normal mood and affect. A and O x 3   Rosendo Couser E. Tomasa Rand, MD [facility]

## 2024-01-19 NOTE — Patient Instructions (Signed)
 Handouts provided on high fiber diet, diverticulosis and polyps.  Resume previous diet.  Resume Plavix (clopidogrel) at prior dose tomorrow. Await pathology results.  Repeat colonoscopy (date not yet determined) for surveillance based on pathology results.  Recommend daily fiber supplement to reduce risk of diverticular complications.    YOU HAD AN ENDOSCOPIC PROCEDURE TODAY AT THE [facility]:   Refer to the procedure report that was given to you for any specific questions about what was found during the examination.  If the procedure report does not answer your questions, please call your gastroenterologist to clarify.  If you requested that your care partner not be given the details of your procedure findings, then the procedure report has been included in a sealed envelope for you to review at your convenience later.  YOU SHOULD EXPECT: Some feelings of bloating in the abdomen. Passage of more gas than usual.  Walking can help get rid of the air that was put into your GI tract during the procedure and reduce the bloating. If you had a lower endoscopy (such as a colonoscopy or flexible sigmoidoscopy) you may notice spotting of blood in your stool or on the toilet paper. If you underwent a bowel prep for your procedure, you may not have a normal bowel movement for a few days.  Please Note:  You might notice some irritation and congestion in your nose or some drainage.  This is from the oxygen used during your procedure.  There is no need for concern and it should clear up in a day or so.  SYMPTOMS TO REPORT IMMEDIATELY:  Following lower endoscopy (colonoscopy or flexible sigmoidoscopy):  Excessive amounts of blood in the stool  Significant tenderness or worsening of abdominal pains  Swelling of the abdomen that is new, acute  Fever of 100F or higher  For urgent or emergent issues, a gastroenterologist can be reached at any hour by calling [phone]. Do not use MyChart  messaging for urgent concerns.    DIET:  We do recommend a small meal at first, but then you may proceed to your regular diet.  Drink plenty of fluids but you should avoid alcoholic beverages for 24 hours.  ACTIVITY:  You should plan to take it easy for the rest of today and you should NOT DRIVE or use heavy machinery until tomorrow (because of the sedation medicines used during the test).    FOLLOW UP: Our staff will call the number listed on your records the next business day following your procedure.  We will call around 7:15- 8:00 am to check on you and address any questions or concerns that you may have regarding the information given to you following your procedure. If we do not reach you, we will leave a message.     If any biopsies were taken you will be contacted by phone or by letter within the next 1-3 weeks.  Please call us at [phone] if you have not heard about the biopsies in 3 weeks.    SIGNATURES/CONFIDENTIALITY: You and/or your care partner have signed paperwork which will be entered into your electronic medical record.  These signatures attest to the fact that that the information above on your After Visit Summary has been reviewed and is understood.  Full responsibility of the confidentiality of this discharge information lies with you and/or your care-partner.

## 2024-01-19 NOTE — Progress Notes (Signed)
 Pt's states no medical or surgical changes since previsit or office visit.

## 2024-01-19 NOTE — Progress Notes (Signed)
 Sedate, gd SR, tolerated procedure well, VSS, report to RN

## 2024-01-19 NOTE — Progress Notes (Signed)
 Called to room to assist during endoscopic procedure.  Patient ID and intended procedure confirmed with present staff. Received instructions for my participation in the procedure from the performing physician.

## 2024-01-19 NOTE — Op Note (Signed)
  [facility] Patient Name: [name] Procedure Date: 01/19/2024 9:03 AM MRN: [id number] Endoscopist: Lorin Picket E. Tomasa Rand , MD, [id number] Age: 59 Referring MD:  Date of Birth: [birth year] Gender: Female Account #: [id number] Procedure:                Colonoscopy Indications:              Screening for colorectal malignant neoplasm, This                            is the patient's first colonoscopy Medicines:                Monitored Anesthesia Care Procedure:                Pre-Anesthesia Assessment:                           - Prior to the procedure, a History and Physical                            was performed, and patient medications and                            allergies were reviewed. The patient's tolerance of                            previous anesthesia was also reviewed. The risks                            and benefits of the procedure and the sedation                            options and risks were discussed with the patient.                            All questions were answered, and informed consent                            was obtained. Prior Anticoagulants: The patient has                            taken Plavix (clopidogrel), last dose was 10 days                            prior to procedure. ASA Grade Assessment: III - A                            patient with severe systemic disease. After                            reviewing the risks and benefits, the patient was                            deemed in satisfactory condition to undergo the  procedure.                           After obtaining informed consent, the colonoscope                            was passed under direct vision. Throughout the                            procedure, the patient's blood pressure, pulse, and                            oxygen saturations were monitored continuously. The                            Olympus Scope SN [id number] was  introduced through the                            anus and advanced to the the cecum, identified by                            appendiceal orifice and ileocecal valve. The                            colonoscopy was performed without difficulty. The                            patient tolerated the procedure well. The quality                            of the bowel preparation was good. The ileocecal                            valve, appendiceal orifice, and rectum were                            photographed. The bowel preparation used was                            SUFLAVE via split dose instruction. Scope In: 9:16:44 AM Scope Out: 9:34:25 AM Scope Withdrawal Time: 0 hours 13 minutes 29 seconds  Total Procedure Duration: 0 hours 17 minutes 41 seconds  Findings:                 Skin tags were found on perianal exam.                           The digital rectal exam was normal. Pertinent                            negatives include normal sphincter tone and no                            palpable rectal lesions.  A 3 mm polyp was found in the ascending colon. The                            polyp was sessile. The polyp was removed with a                            cold snare. Resection and retrieval were complete.                            Estimated blood loss was minimal.                           A 3 mm polyp was found in the distal rectum. The                            polyp was flat. The polyp was removed with a cold                            snare. Resection and retrieval were complete.                            Estimated blood loss was minimal.                           Many medium-mouthed and small-mouthed diverticula                            were found in the sigmoid colon and descending                            colon. There was narrowing of the colon in                            association with the diverticular opening.                            The exam was otherwise normal throughout the                            examined colon.                           The retroflexed view of the distal rectum and anal                            verge was normal and showed no anal or rectal                            abnormalities. Complications:            No immediate complications. Estimated Blood Loss:     Estimated blood loss was minimal. Impression:               - Perianal skin tags found on perianal exam.                           -  One 3 mm polyp in the ascending colon, removed                            with a cold snare. Resected and retrieved.                           - One 3 mm polyp in the distal rectum, removed with                            a cold snare. Resected and retrieved.                           - Moderate diverticulosis in the sigmoid colon and                            in the descending colon. There was narrowing of the                            colon in association with the diverticular opening.                           - The distal rectum and anal verge are normal on                            retroflexion view. Recommendation:           - Patient has a contact number available for                            emergencies. The signs and symptoms of potential                            delayed complications were discussed with the                            patient. Return to normal activities tomorrow.                            Written discharge instructions were provided to the                            patient.                           - Resume previous diet.                           - Resume Plavix (clopidogrel) at prior dose                            tomorrow.                           - Await pathology results.                           -  Repeat colonoscopy (date not yet determined) for                            surveillance based on pathology results.                           - Recommend daily  fiber supplement to reduce risk                            of diverticular complications. Carolin Quang E. Tomasa Rand, MD 01/19/2024 9:42:25 AM This report has been signed electronically.

## 2024-01-20 ENCOUNTER — Telehealth: Payer: Self-pay

## 2024-01-20 NOTE — Telephone Encounter (Signed)
  Follow up Call-     01/19/2024    8:07 AM  Call back number  Post procedure Call Back phone  # [phone]  Permission to leave phone message Yes     Patient questions:  Do you have a fever, pain , or abdominal swelling? No. Pain Score  0 *  Have you tolerated food without any problems? Yes.    Have you been able to return to your normal activities? Yes.    Do you have any questions about your discharge instructions: Diet   No. Medications  No. Follow up visit  No.  Do you have questions or concerns about your Care? No.  Actions: * If pain score is 4 or above: No action needed, pain <4.

## 2024-01-23 LAB — SURGICAL PATHOLOGY

## 2024-01-24 ENCOUNTER — Other Ambulatory Visit: Payer: Self-pay

## 2024-01-24 ENCOUNTER — Encounter: Payer: Self-pay | Admitting: Infectious Disease

## 2024-01-24 ENCOUNTER — Ambulatory Visit (INDEPENDENT_AMBULATORY_CARE_PROVIDER_SITE_OTHER): Payer: 59 | Admitting: Infectious Disease

## 2024-01-24 ENCOUNTER — Other Ambulatory Visit (HOSPITAL_COMMUNITY)
Admission: RE | Admit: 2024-01-24 | Discharge: 2024-01-24 | Disposition: A | Discharge status: Home / Self Care | Source: Ambulatory Visit | Attending: Infectious Disease | Admitting: Infectious Disease

## 2024-01-24 VITALS — BP 169/92 | HR 77 | Resp 16 | Ht 64.0 in | Wt 237.7 lb

## 2024-01-24 DIAGNOSIS — E782 Mixed hyperlipidemia: Secondary | ICD-10-CM | POA: Diagnosis not present

## 2024-01-24 DIAGNOSIS — B20 Human immunodeficiency virus [HIV] disease: Secondary | ICD-10-CM | POA: Diagnosis present

## 2024-01-24 DIAGNOSIS — R5383 Other fatigue: Secondary | ICD-10-CM

## 2024-01-24 DIAGNOSIS — I10 Essential (primary) hypertension: Secondary | ICD-10-CM

## 2024-01-24 DIAGNOSIS — G459 Transient cerebral ischemic attack, unspecified: Secondary | ICD-10-CM

## 2024-01-24 DIAGNOSIS — Z7185 Encounter for immunization safety counseling: Secondary | ICD-10-CM

## 2024-01-24 MED ORDER — ROSUVASTATIN CALCIUM 20 MG PO TABS: 20.0000 mg | ORAL_TABLET | Freq: Every day | ORAL | 11 refills | Status: DC

## 2024-01-24 MED ORDER — SYMTUZA 800-150-200-10 MG PO TABS: 1.0000 | ORAL_TABLET | Freq: Every day | ORAL | 11 refills | Status: DC

## 2024-01-24 NOTE — Progress Notes (Signed)
 Subjective:  Chief complaint: follow-up for HIV disease on medications   Patient ID: [name], female    DOB: [birth year], 58 y.o.   MRN: [id number]  HPI  Discussed the use of AI scribe software for clinical note transcription with the patient, who gave verbal consent to proceed.  History of Present Illness   The patient, with a history of prior CVA and needed to see Neurologist to get clearance to stop her blood thinner prior to colonoscopy. , presents for follow-up. She reports a positive experience with the colonoscopy and neurologist visit, having stopped her blood thinner for seven days prior to the procedure without complications. She expresses a desire to establish primary care within the same clinic for convenience and continuity of care.  The patient also discusses her medication regimen, expressing concerns about potential drug interactions. She mentions a previous issue with a high dose of a statin medication and discusses her current medications, including Symtuza nd a blood thinner. She also mentions taking Tylenol as needed and avoiding ibuprofen due to potential kidney damage.  In addition to her own health, the patient discusses her mother's health and living situation. Her mother, who lives with her, has recently quit smoking and drinking but is resistant to physical therapy and has lost weight. The patient expresses frustration with her mother's lack of cooperation and the lack of support from her siblings.  The patient also mentions plans to move to [place] but intends to continue seeing her current healthcare providers. She expresses interest in participating in a research study on an HIV vaccine, with the understanding that it may not be the actual vaccine and that it will not interfere with her current medication regimen.      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   Nonallopathic lesion of head 11/16/2017   Shingles    2000   Stroke [facility])    Vaccine counseling 12/01/2021    Past Surgical History:  Procedure Laterality Date   ABDOMINAL HYSTERECTOMY  1993   Partial   CESAREAN SECTION     LAPAROSCOPIC ASSISTED VENTRAL HERNIA REPAIR N/A 01/02/2015   Procedure: LAPAROSCOPIC VENTRAL WALL HERNIA REPAIR;  Surgeon: Karie Soda, MD;  Location: [facility];  Service: General;  Laterality: N/A;  With MESH   TONSILLECTOMY  1988    Family History  Problem Relation Age of Onset   Diabetes type II Brother    Liver cancer Neg Hx    Colon cancer Neg Hx    Esophageal cancer Neg Hx       Social History   Socioeconomic History   Marital status: Single    Spouse name: Not on file   Number of children: Not on file   Years of education: HS   Highest education level: Not on file  Occupational History  Occupation: disabled  Tobacco Use   Smoking status: Former    Types: Cigarettes   Smokeless tobacco: Never  Vaping Use   Vaping status: Never Used  Substance and Sexual Activity   Alcohol use: Yes    Comment: occasional   Drug use: Yes    Frequency: 14.0 times per week    Types: Marijuana    Comment: daily   Sexual activity: Not Currently    Comment: given condoms  Other Topics Concern   Not on file  Social History Narrative   Not on file   Social Drivers of Health   Financial Resource Strain: Not on file  Food Insecurity: Not on file  Transportation Needs: Not on file  Physical  Activity: Not on file  Stress: Not on file  Social Connections: Not on file    Allergies  Allergen Reactions   Morphine Other (See Comments)    Burning under the skin   Doxycycline Hyclate Rash     Current Outpatient Medications:    clopidogrel (PLAVIX) 75 MG tablet, Take 1 tablet (75 mg total) by mouth daily., Disp: 90 tablet, Rfl: 3   cyclobenzaprine (FLEXERIL) 10 MG tablet, TAKE 1 TABLET BY MOUTH THREE TIMES A DAY AS NEEDED, Disp: 90 tablet, Rfl: 0   cycloSPORINE (RESTASIS) 0.05 % ophthalmic emulsion, Apply to eye., Disp: , Rfl:    gabapentin (NEURONTIN) 300 MG capsule, TAKE 1 CAPSULE BY MOUTH THREE TIMES A DAY, Disp: 270 capsule, Rfl: 4   metoprolol succinate (TOPROL XL) 50 MG 24 hr tablet, Take 1 tablet (50 mg total) by mouth daily. Take with or immediately following a meal., Disp: 90 tablet, Rfl: 4   omeprazole (PRILOSEC) 20 MG capsule, TAKE 1 CAPSULE BY MOUTH EVERY DAY, Disp: 90 capsule, Rfl: 1   valACYclovir (VALTREX) 1000 MG tablet, Take 1 tablet (1,000 mg total) by mouth daily., Disp: 30 tablet, Rfl: 11   Darunavir-Cobicistat-Emtricitabine-Tenofovir Alafenamide (SYMTUZA) 800-150-200-10 MG TABS, Take 1 tablet by mouth daily with breakfast., Disp: 30 tablet, Rfl: 11   rosuvastatin (CRESTOR) 20 MG tablet, Take 1 tablet (20 mg total) by mouth daily., Disp: 30 tablet, Rfl: 11    Review of Systems  Constitutional:  Negative for activity change, appetite change, chills, diaphoresis, fatigue, fever and unexpected weight change.  HENT:  Negative for congestion, rhinorrhea, sinus pressure, sneezing, sore throat and trouble swallowing.   Eyes:  Negative for photophobia and visual disturbance.  Respiratory:  Negative for cough, chest tightness, shortness of breath, wheezing and stridor.   Cardiovascular:  Negative for chest pain, palpitations and leg swelling.  Gastrointestinal:  Negative for abdominal distention, abdominal pain, anal bleeding, blood in stool, constipation, diarrhea,  nausea and vomiting.  Genitourinary:  Negative for difficulty urinating, dysuria, flank pain and hematuria.  Musculoskeletal:  Negative for arthralgias, back pain, gait problem, joint swelling and myalgias.  Skin:  Negative for color change, pallor, rash and wound.  Neurological:  Negative for dizziness, tremors, weakness and light-headedness.  Hematological:  Negative for adenopathy. Does not bruise/bleed easily.  Psychiatric/Behavioral:  Negative for agitation, behavioral problems, confusion, decreased concentration, dysphoric mood and sleep disturbance.        Objective:   Physical Exam Constitutional:      General: She is not in acute distress.    Appearance: She is not diaphoretic.  HENT:     Head: Normocephalic and atraumatic.     Right Ear: External ear normal.     Left Ear: External ear normal.  Nose: Nose normal.     Mouth/Throat:     Pharynx: No oropharyngeal exudate.  Eyes:     General: No scleral icterus.       Right eye: No discharge.        Left eye: No discharge.     Extraocular Movements: Extraocular movements intact.     Conjunctiva/sclera: Conjunctivae normal.  Cardiovascular:     Rate and Rhythm: Normal rate and regular rhythm.  Pulmonary:     Effort: Pulmonary effort is normal. No respiratory distress.     Breath sounds: No wheezing or rales.  Abdominal:     General: There is no distension.     Palpations: Abdomen is soft.  Musculoskeletal:        General: No tenderness. Normal range of motion.     Cervical back: Normal range of motion and neck supple.  Lymphadenopathy:     Cervical: No cervical adenopathy.  Skin:    General: Skin is warm and dry.     Coloration: Skin is not jaundiced or pale.     Findings: No erythema, lesion or rash.  Neurological:     General: No focal deficit present.     Mental Status: She is alert and oriented to person, place, and time.     Coordination: Coordination normal.  Psychiatric:        Mood and Affect: Mood  normal.        Behavior: Behavior normal.        Thought Content: Thought content normal.        Judgment: Judgment normal.           Assessment & Plan:   Assessment and Plan    HIV infection On Symtuza. Discussed potential participation in HIV vaccine [phone]  aimed at immune system stimulation to recognize and eliminate HIV-infected cells. Explained study's purpose, benefits, and non-curative nature. Reassured no harm to kidneys and continuation of Symtuza. Participation involves five doses of vaccine or placebo, with labs and vaccines at no cost. Compensation provided. - Discuss potential participation in HIV vaccine study with research team. - Ensure continuation of Symtuza therapy. --check HIV RNA, CD4  Hyperlipidemia: continue crestor. Interaction with Symtuza noted and she is on maximum allowable dose of crestor with the COBI boosting. I would like to get her onto a non boosted regimen but I know she likes STR and she has extensive R. BIC/LEN tablet that is in the works might be just right thing for her down the road  Caregiver stress Experiencing stress from caregiving for mother with health issues. Acknowledged challenges and dedication. - Acknowledge caregiver stress and provide emotional support.  Primary care establishment Desires to establish care with primary care clinic in same building. Supported decision for preventive care and Development worker, international aid. - Encourage her to establish care with a primary care provider in the same building.  Follow-up Plans to continue care with current providers despite potential relocation. Values continuity and support. - Schedule follow-up appointment in six months.      HTN to follow up with PCP  TIA: on plavix, crestor and metoprolol

## 2024-01-25 LAB — URINE CYTOLOGY ANCILLARY ONLY
Chlamydia: NEGATIVE
Comment: NEGATIVE
Comment: NORMAL
Neisseria Gonorrhea: NEGATIVE

## 2024-01-26 LAB — RPR: RPR Ser Ql: NONREACTIVE

## 2024-01-26 LAB — CBC WITH DIFFERENTIAL/PLATELET
Absolute Lymphocytes: 3267 {cells}/uL (ref 850–3900)
Absolute Monocytes: 614 {cells}/uL (ref 200–950)
Basophils Absolute: 79 {cells}/uL (ref 0–200)
Basophils Relative: 0.8 %
Eosinophils Absolute: 356 {cells}/uL (ref 15–500)
Eosinophils Relative: 3.6 %
HCT: 39.2 % (ref 35.0–45.0)
Hemoglobin: 12.5 g/dL (ref 11.7–15.5)
MCH: 27.3 pg (ref 27.0–33.0)
MCHC: 31.9 g/dL — ABNORMAL LOW (ref 32.0–36.0)
MCV: 85.6 fL (ref 80.0–100.0)
MPV: 10.5 fL (ref 7.5–12.5)
Monocytes Relative: 6.2 %
Neutro Abs: 5584 {cells}/uL (ref 1500–7800)
Neutrophils Relative %: 56.4 %
Platelets: 441 10*3/uL — ABNORMAL HIGH (ref 140–400)
RBC: 4.58 10*6/uL (ref 3.80–5.10)
RDW: 14 % (ref 11.0–15.0)
Total Lymphocyte: 33 %
WBC: 9.9 10*3/uL (ref 3.8–10.8)

## 2024-01-26 LAB — COMPLETE METABOLIC PANEL WITHOUT GFR
AG Ratio: 1.4 (calc) (ref 1.0–2.5)
ALT: 26 U/L (ref 6–29)
AST: 19 U/L (ref 10–35)
Albumin: 4.2 g/dL (ref 3.6–5.1)
Alkaline phosphatase (APISO): 114 U/L (ref 37–153)
BUN: 13 mg/dL (ref 7–25)
CO2: 26 mmol/L (ref 20–32)
Calcium: 9.5 mg/dL (ref 8.6–10.4)
Chloride: 103 mmol/L (ref 98–110)
Creat: 1.01 mg/dL (ref 0.50–1.03)
Globulin: 3.1 g/dL (ref 1.9–3.7)
Glucose, Bld: 109 mg/dL — ABNORMAL HIGH (ref 65–99)
Potassium: 4.2 mmol/L (ref 3.5–5.3)
Sodium: 138 mmol/L (ref 135–146)
Total Bilirubin: 0.3 mg/dL (ref 0.2–1.2)
Total Protein: 7.3 g/dL (ref 6.1–8.1)

## 2024-01-26 LAB — LIPID PANEL
Cholesterol: 164 mg/dL (ref <200)
HDL: 39 mg/dL — ABNORMAL LOW (ref >=50)
LDL Cholesterol (Calc): 104 mg/dL — ABNORMAL HIGH
Non-HDL Cholesterol (Calc): 125 mg/dL (ref <130)
Total CHOL/HDL Ratio: 4.2 (calc) (ref <5.0)
Triglycerides: 111 mg/dL (ref <150)

## 2024-01-26 LAB — T-HELPER CELLS (CD4) COUNT (NOT AT ARMC)
CD4 % Helper T Cell: 26 % — ABNORMAL LOW (ref 33–65)
CD4 T Cell Abs: 796 /uL (ref 400–1790)

## 2024-01-26 LAB — HIV-1 RNA QUANT-NO REFLEX-BLD
HIV 1 RNA Quant: <20 {copies}/mL — AB
HIV-1 RNA Quant, Log: <1.3 {Log_copies}/mL — AB

## 2024-01-30 ENCOUNTER — Encounter: Payer: Self-pay | Admitting: Gastroenterology

## 2024-02-21 ENCOUNTER — Other Ambulatory Visit: Payer: Self-pay | Admitting: Infectious Disease

## 2024-02-21 NOTE — Telephone Encounter (Signed)
 Okay to continue refilling omeprazole ?

## 2024-03-30 NOTE — Progress Notes (Signed)
 The ASCVD Risk score (Arnett DK, et al., 2019) failed to calculate for the following reasons:   Risk score cannot be calculated because patient has a medical history suggesting prior/existing ASCVD  Arlon Bergamo, BSN, RN

## 2024-04-20 ENCOUNTER — Other Ambulatory Visit: Payer: Self-pay | Admitting: Infectious Disease

## 2024-04-23 NOTE — Telephone Encounter (Signed)
 Okay to refill?

## 2024-05-20 ENCOUNTER — Other Ambulatory Visit: Payer: Self-pay | Admitting: Infectious Disease

## 2024-05-21 NOTE — Telephone Encounter (Signed)
 Please advise on refill request

## 2024-05-22 ENCOUNTER — Other Ambulatory Visit: Payer: Self-pay

## 2024-05-22 DIAGNOSIS — I10 Essential (primary) hypertension: Secondary | ICD-10-CM

## 2024-05-22 DIAGNOSIS — Z7689 Persons encountering health services in other specified circumstances: Secondary | ICD-10-CM

## 2024-06-18 ENCOUNTER — Other Ambulatory Visit: Payer: Self-pay | Admitting: Infectious Disease

## 2024-07-24 NOTE — Progress Notes (Unsigned)
 Subjective:  Chief complaint: follow-up for HIV disease on medications   Patient ID: [name], female    DOB: 1966-04-07, 58 y.o.   MRN: [id number]  HPI  Past Medical History:  Diagnosis Date   Anemia    Depression with anxiety 05/13/2021   GERD (gastroesophageal reflux disease)    Grieving 05/13/2021   Headache    hx of migraines    History of cocaine abuse (HCC)    HIV disease (HCC)    HIV infection (HCC)    Hyperlipidemia 08/18/2021   Hypertension    not on medication    Incarcerated incisional hernia s/p lap VWH repair 01/02/2015 12/18/2014   Muscle spasm 05/11/2017   Myalgia 02/02/2023   Myocardial infarction [facility])    mild MI 09/2004     no heart problems since   Neck swelling 11/16/2017   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   Nonallopathic lesion of head 11/16/2017   Shingles    2000   Stroke [facility])    Vaccine counseling 12/01/2021    Past Surgical History:  Procedure Laterality Date   ABDOMINAL HYSTERECTOMY  1993   Partial   CESAREAN SECTION     LAPAROSCOPIC ASSISTED VENTRAL HERNIA REPAIR N/A 01/02/2015   Procedure: LAPAROSCOPIC VENTRAL WALL HERNIA REPAIR;  Surgeon: Elspeth Schultze, MD;  Location: [facility];  Service: General;  Laterality: N/A;  With MESH   TONSILLECTOMY  1988    Family History  Problem Relation Age of Onset   Diabetes type II Brother    Liver cancer Neg Hx    Colon cancer Neg Hx    Esophageal cancer Neg Hx       Social History   Socioeconomic History   Marital status: Single    Spouse name: Not on file   Number of children: Not on file   Years of education: HS   Highest education level: Not on file  Occupational History   Occupation: disabled  Tobacco Use   Smoking status: Former    Types: Cigarettes   Smokeless tobacco: Never  Vaping Use   Vaping status: Never Used  Substance and Sexual Activity   Alcohol use: Yes    Comment: occasional   Drug use: Yes    Frequency: 14.0 times per week    Types: Marijuana     Comment: daily   Sexual activity: Not Currently    Comment: given condoms  Other Topics Concern   Not on file  Social History Narrative   Not on file   Social Drivers of Health   Financial Resource Strain: Not on file  Food Insecurity: Not on file  Transportation Needs: Not on file  Physical Activity: Not on file  Stress: Not on file  Social Connections: Not on file    Allergies  Allergen Reactions   Morphine Other (See Comments)    Burning under the skin   Doxycycline Hyclate Rash     Current Outpatient Medications:    clopidogrel  (PLAVIX ) 75 MG tablet, Take 1 tablet (75 mg total) by mouth daily., Disp: 90 tablet, Rfl: 3   cyclobenzaprine  (FLEXERIL ) 10 MG tablet, TAKE 1 TABLET BY MOUTH THREE TIMES A DAY AS NEEDED, Disp: 90 tablet, Rfl: 0   cycloSPORINE (RESTASIS) 0.05 % ophthalmic emulsion, Apply to eye., Disp: , Rfl:    Darunavir -Cobicistat -Emtricitabine -Tenofovir  Alafenamide (SYMTUZA ) 800-150-200-10 MG TABS, Take 1 tablet by mouth daily with breakfast., Disp: 30 tablet, Rfl: 11   gabapentin  (NEURONTIN ) 300 MG capsule, TAKE 1 CAPSULE BY MOUTH  THREE TIMES A DAY, Disp: 270 capsule, Rfl: 4   metoprolol  succinate (TOPROL  XL) 50 MG 24 hr tablet, Take 1 tablet (50 mg total) by mouth daily. Take with or immediately following a meal., Disp: 90 tablet, Rfl: 4   omeprazole  (PRILOSEC) 20 MG capsule, TAKE 1 CAPSULE BY MOUTH EVERY DAY, Disp: 90 capsule, Rfl: 1   rosuvastatin  (CRESTOR ) 20 MG tablet, Take 1 tablet (20 mg total) by mouth daily., Disp: 30 tablet, Rfl: 11   valACYclovir  (VALTREX ) 1000 MG tablet, Take 1 tablet (1,000 mg total) by mouth daily., Disp: 30 tablet, Rfl: 11   Review of Systems     Objective:   Physical Exam        Assessment & Plan:

## 2024-07-25 ENCOUNTER — Ambulatory Visit (INDEPENDENT_AMBULATORY_CARE_PROVIDER_SITE_OTHER): Admitting: Infectious Disease

## 2024-07-25 ENCOUNTER — Other Ambulatory Visit: Payer: Self-pay

## 2024-07-25 ENCOUNTER — Encounter: Payer: Self-pay | Admitting: Infectious Disease

## 2024-07-25 ENCOUNTER — Other Ambulatory Visit (HOSPITAL_COMMUNITY)
Admission: RE | Admit: 2024-07-25 | Discharge: 2024-07-25 | Disposition: A | Discharge status: Home / Self Care | Source: Ambulatory Visit | Attending: Infectious Disease | Admitting: Infectious Disease

## 2024-07-25 VITALS — BP 180/94 | HR 89 | Temp 98.0°F | Ht 61.0 in | Wt 242.0 lb

## 2024-07-25 DIAGNOSIS — Z6841 Body Mass Index (BMI) 40.0 and over, adult: Secondary | ICD-10-CM

## 2024-07-25 DIAGNOSIS — I10 Essential (primary) hypertension: Secondary | ICD-10-CM | POA: Insufficient documentation

## 2024-07-25 DIAGNOSIS — Z124 Encounter for screening for malignant neoplasm of cervix: Secondary | ICD-10-CM | POA: Insufficient documentation

## 2024-07-25 DIAGNOSIS — E782 Mixed hyperlipidemia: Secondary | ICD-10-CM

## 2024-07-25 DIAGNOSIS — K219 Gastro-esophageal reflux disease without esophagitis: Secondary | ICD-10-CM | POA: Diagnosis not present

## 2024-07-25 DIAGNOSIS — R739 Hyperglycemia, unspecified: Secondary | ICD-10-CM

## 2024-07-25 DIAGNOSIS — Z7185 Encounter for immunization safety counseling: Secondary | ICD-10-CM | POA: Insufficient documentation

## 2024-07-25 DIAGNOSIS — B20 Human immunodeficiency virus [HIV] disease: Secondary | ICD-10-CM

## 2024-07-25 DIAGNOSIS — Z1231 Encounter for screening mammogram for malignant neoplasm of breast: Secondary | ICD-10-CM | POA: Diagnosis present

## 2024-07-25 DIAGNOSIS — G459 Transient cerebral ischemic attack, unspecified: Secondary | ICD-10-CM

## 2024-07-25 DIAGNOSIS — G629 Polyneuropathy, unspecified: Secondary | ICD-10-CM

## 2024-07-25 DIAGNOSIS — Z23 Encounter for immunization: Secondary | ICD-10-CM | POA: Diagnosis not present

## 2024-07-25 DIAGNOSIS — E669 Obesity, unspecified: Secondary | ICD-10-CM

## 2024-07-25 MED ORDER — CLOPIDOGREL BISULFATE 75 MG PO TABS: 75.0000 mg | ORAL_TABLET | Freq: Every day | ORAL | 3 refills | Status: AC

## 2024-07-25 MED ORDER — ROSUVASTATIN CALCIUM 20 MG PO TABS: 20.0000 mg | ORAL_TABLET | Freq: Every day | ORAL | 11 refills | Status: AC

## 2024-07-25 MED ORDER — METOPROLOL SUCCINATE ER 50 MG PO TB24: 50.0000 mg | ORAL_TABLET | Freq: Every day | ORAL | 4 refills | Status: AC

## 2024-07-25 MED ORDER — SYMTUZA 800-150-200-10 MG PO TABS: 1.0000 | ORAL_TABLET | Freq: Every day | ORAL | 11 refills | Status: AC

## 2024-07-25 MED ORDER — GABAPENTIN 300 MG PO CAPS
ORAL_CAPSULE | ORAL | 4 refills | Status: DC
Start: 2024-07-25 — End: 2024-07-25

## 2024-07-25 MED ORDER — VALACYCLOVIR HCL 1 G PO TABS: 1000.0000 mg | ORAL_TABLET | Freq: Every day | ORAL | 11 refills | Status: AC

## 2024-07-25 MED ORDER — OMEPRAZOLE 20 MG PO CPDR: 20.0000 mg | DELAYED_RELEASE_CAPSULE | Freq: Every day | ORAL | 3 refills | Status: DC

## 2024-07-26 ENCOUNTER — Ambulatory Visit: Payer: Self-pay

## 2024-07-26 LAB — URINE CYTOLOGY ANCILLARY ONLY
Chlamydia: NEGATIVE
Comment: NEGATIVE
Comment: NORMAL
Neisseria Gonorrhea: NEGATIVE

## 2024-07-26 LAB — T-HELPER CELLS (CD4) COUNT (NOT AT ARMC)
CD4 % Helper T Cell: 27 % — ABNORMAL LOW (ref 33–65)
CD4 T Cell Abs: 893 /uL (ref 400–1790)

## 2024-07-26 NOTE — Telephone Encounter (Signed)
-----   Message from [place] sent at 07/26/2024  2:06 PM EDT ----- Regarding: FW: [name] is now in diabetic range. I can send in some metformin for her 500 my bid ----- Message ----- From: Interface, [facility] Lab Results In Sent: 07/25/2024  10:49 PM EDT To: Jomarie LOISE Fleeta Kathie, MD

## 2024-07-26 NOTE — Telephone Encounter (Signed)
 Spoke with patient who states she would prefer to hold off on starting medication. Will follow up with pcp regarding this

## 2024-07-28 LAB — CBC WITH DIFFERENTIAL/PLATELET
Absolute Lymphocytes: 3807 {cells}/uL (ref 850–3900)
Absolute Monocytes: 620 {cells}/uL (ref 200–950)
Basophils Absolute: 66 {cells}/uL (ref 0–200)
Basophils Relative: 0.7 %
Eosinophils Absolute: 367 {cells}/uL (ref 15–500)
Eosinophils Relative: 3.9 %
HCT: 38.4 % (ref 35.0–45.0)
Hemoglobin: 12.3 g/dL (ref 11.7–15.5)
MCH: 27.9 pg (ref 27.0–33.0)
MCHC: 32 g/dL (ref 32.0–36.0)
MCV: 87.1 fL (ref 80.0–100.0)
MPV: 9.9 fL (ref 7.5–12.5)
Monocytes Relative: 6.6 %
Neutro Abs: 4540 {cells}/uL (ref 1500–7800)
Neutrophils Relative %: 48.3 %
Platelets: 437 Thousand/uL — ABNORMAL HIGH (ref 140–400)
RBC: 4.41 Million/uL (ref 3.80–5.10)
RDW: 14.2 % (ref 11.0–15.0)
Total Lymphocyte: 40.5 %
WBC: 9.4 Thousand/uL (ref 3.8–10.8)

## 2024-07-28 LAB — HEMOGLOBIN A1C
Hgb A1c MFr Bld: 6.6 % — ABNORMAL HIGH (ref <5.7)
Mean Plasma Glucose: 143 mg/dL
eAG (mmol/L): 7.9 mmol/L

## 2024-07-28 LAB — LIPID PANEL
Cholesterol: 174 mg/dL (ref <200)
HDL: 40 mg/dL — ABNORMAL LOW (ref >=50)
LDL Cholesterol (Calc): 108 mg/dL — ABNORMAL HIGH
Non-HDL Cholesterol (Calc): 134 mg/dL — ABNORMAL HIGH (ref <130)
Total CHOL/HDL Ratio: 4.4 (calc) (ref <5.0)
Triglycerides: 145 mg/dL (ref <150)

## 2024-07-28 LAB — HIV-1 RNA QUANT-NO REFLEX-BLD
HIV 1 RNA Quant: 24 {copies}/mL — ABNORMAL HIGH
HIV-1 RNA Quant, Log: 1.38 {Log_copies}/mL — ABNORMAL HIGH

## 2024-07-28 LAB — RPR: RPR Ser Ql: NONREACTIVE

## 2024-08-07 ENCOUNTER — Encounter: Payer: Self-pay | Admitting: Internal Medicine

## 2024-08-07 ENCOUNTER — Ambulatory Visit: Attending: Internal Medicine | Admitting: Internal Medicine

## 2024-08-07 VITALS — BP 132/84 | HR 83 | Ht 64.0 in | Wt 243.8 lb

## 2024-08-07 DIAGNOSIS — I10 Essential (primary) hypertension: Secondary | ICD-10-CM | POA: Diagnosis not present

## 2024-08-07 DIAGNOSIS — E1169 Type 2 diabetes mellitus with other specified complication: Secondary | ICD-10-CM

## 2024-08-07 DIAGNOSIS — E782 Mixed hyperlipidemia: Secondary | ICD-10-CM | POA: Diagnosis not present

## 2024-08-07 DIAGNOSIS — B20 Human immunodeficiency virus [HIV] disease: Secondary | ICD-10-CM

## 2024-08-07 DIAGNOSIS — R0609 Other forms of dyspnea: Secondary | ICD-10-CM

## 2024-08-07 DIAGNOSIS — E785 Hyperlipidemia, unspecified: Secondary | ICD-10-CM

## 2024-08-07 NOTE — Patient Instructions (Signed)
 Medication Instructions:  Your physician recommends that you continue on your current medications as directed. Please refer to the Current Medication list given to you today.  *If you need a refill on your cardiac medications before your next appointment, please call your pharmacy*  Lab Work: None ordered If you have labs (blood work) drawn today and your tests are completely normal, you will receive your results only by: MyChart Message (if you have MyChart) OR A paper copy in the mail If you have any lab test that is abnormal or we need to change your treatment, we will call you to review the results.  Testing/Procedures: None ordered  Follow-Up: At [facility], you and your health needs are our priority.  As part of our continuing mission to provide you with exceptional heart care, our providers are all part of one team.  This team includes your primary Cardiologist (physician) and Advanced Practice Providers or APPs (Physician Assistants and Nurse Practitioners) who all work together to provide you with the care you need, when you need it.  Your next appointment:   1 year(s)  Provider:   Emeline Calender, MD   We recommend signing up for the patient portal called MyChart.  Sign up information is provided on this After Visit Summary.  MyChart is used to connect with patients for Virtual Visits (Telemedicine).  Patients are able to view lab/test results, encounter notes, upcoming appointments, etc.  Non-urgent messages can be sent to your provider as well.   To learn more about what you can do with MyChart, go to [email or web address].

## 2024-08-07 NOTE — Progress Notes (Signed)
 Cardiology Office Note   Date:  08/07/2024  ID:  [name], DOB 17-Jun-1966, MRN [id number] PCP: Shields Shields BRAVO, MD  [facility] Providers Cardiologist:  Shields Passe, MD (Inactive)     History of Present Illness Shields Shields is a 58 y.o. female with a past medical history of HIV, obesity, TIA, hypertension, cocaine use (quit in 2008) tobacco use, marijuana use, hyperlipidemia who was initially seen initially for dyspnea on exertion.  Overall she has been doing very well.  She does have some dyspnea on exertion but relates this to weight gain.  She has an appointment with the wellness center to discuss diet and exercise and lifestyle modification this month and is very much looking forward to that.  She is active in her community and has a positive outlook on life despite multiple challenges including the death of her son 20 years ago.  Denies any chest pain or any other complaints.   ROS:  Review of Systems  All other systems reviewed and are negative.   Physical Exam  Physical Exam Vitals and nursing note reviewed.  Constitutional:      Appearance: Normal appearance. She is obese.  HENT:     Head: Normocephalic and atraumatic.  Eyes:     Conjunctiva/sclera: Conjunctivae normal.  Neck:     Vascular: No carotid bruit.  Cardiovascular:     Rate and Rhythm: Normal rate and regular rhythm.  Pulmonary:     Effort: Pulmonary effort is normal.     Breath sounds: Normal breath sounds.  Musculoskeletal:        General: No swelling or tenderness.  Skin:    Coloration: Skin is not jaundiced or pale.  Neurological:     Mental Status: She is alert.     VS:  BP 132/84   Pulse 83   Ht 5' 4 (1.626 m)   Wt 243 lb 12.8 oz (110.6 kg)   SpO2 98%   BMI 41.85 kg/m         Wt Readings from Last 3 Encounters:  08/07/24 243 lb 12.8 oz (110.6 kg)  07/25/24 242 lb (109.8 kg)  01/24/24 237 lb 11.2 oz (107.8 kg)     EKG Interpretation Date/Time:  Tuesday  August 07 2024 08:38:04 EDT Ventricular Rate:  83 PR Interval:  170 QRS Duration:  70 QT Interval:  392 QTC Calculation: 460 R Axis:   46  Text Interpretation: Normal sinus rhythm Normal ECG Nonspecific ST and T wave abnormality When compared with ECG of 21-Dec-2021 14:03, Premature atrial complexes are no longer Present Confirmed by Shields Shields [phone]) on 08/07/2024 8:39:55 AM    Studies Reviewed   Echocardiogram 01/07/2021:   1. Left ventricular ejection fraction, by estimation, is 55 to 60%. The  left ventricle has normal function. The left ventricle has no regional  wall motion abnormalities. Left ventricular diastolic parameters were  normal.   2. Right ventricular systolic function is normal. The right ventricular  size is normal.   3. The mitral valve is normal in structure. No evidence of mitral valve  regurgitation. No evidence of mitral stenosis.   4. The aortic valve is tricuspid. There is mild calcification of the  aortic valve. Aortic valve regurgitation is not visualized. No aortic  stenosis is present.      Risk Assessment/Calculations             ASCVD risk score: The ASCVD Risk score (Arnett DK, et al., 2019) failed to calculate for the following  reasons:   Risk score cannot be calculated because patient has a medical history suggesting prior/existing ASCVD   ASSESSMENT  Dyspnea on exertion, likely due to deconditioning/obesity Hypertension controlled on metoprolol  succinate 50 mg Hyperlipidemia working on lifestyle modification Well-controlled HIV follows with ID TIA Former cocaine use Type 2 diabetes working on lifestyle modification   Plan  Patient has plans to start working on aggressive lifestyle modification this month and has an appointment with the wellness center this month.   Follow up: 1 year          Signed, Shields FORBES Calender, MD

## 2024-08-15 ENCOUNTER — Encounter (INDEPENDENT_AMBULATORY_CARE_PROVIDER_SITE_OTHER): Payer: Self-pay | Admitting: Physician Assistant

## 2024-08-15 ENCOUNTER — Ambulatory Visit (INDEPENDENT_AMBULATORY_CARE_PROVIDER_SITE_OTHER): Admitting: Physician Assistant

## 2024-08-15 VITALS — BP 179/102 | HR 100 | Temp 98.1°F | Ht 64.0 in | Wt 232.0 lb

## 2024-08-15 DIAGNOSIS — Z6839 Body mass index (BMI) 39.0-39.9, adult: Secondary | ICD-10-CM

## 2024-08-15 DIAGNOSIS — E785 Hyperlipidemia, unspecified: Secondary | ICD-10-CM | POA: Diagnosis not present

## 2024-08-15 DIAGNOSIS — E66812 Obesity, class 2: Secondary | ICD-10-CM | POA: Diagnosis not present

## 2024-08-15 DIAGNOSIS — G459 Transient cerebral ischemic attack, unspecified: Secondary | ICD-10-CM

## 2024-08-15 DIAGNOSIS — F418 Other specified anxiety disorders: Secondary | ICD-10-CM

## 2024-08-15 DIAGNOSIS — I10 Essential (primary) hypertension: Secondary | ICD-10-CM

## 2024-08-15 DIAGNOSIS — B20 Human immunodeficiency virus [HIV] disease: Secondary | ICD-10-CM

## 2024-08-15 DIAGNOSIS — G500 Trigeminal neuralgia: Secondary | ICD-10-CM

## 2024-08-15 DIAGNOSIS — R7303 Prediabetes: Secondary | ICD-10-CM | POA: Diagnosis not present

## 2024-08-15 DIAGNOSIS — E782 Mixed hyperlipidemia: Secondary | ICD-10-CM

## 2024-08-15 DIAGNOSIS — G4719 Other hypersomnia: Secondary | ICD-10-CM

## 2024-08-15 DIAGNOSIS — K430 Incisional hernia with obstruction, without gangrene: Secondary | ICD-10-CM

## 2024-08-15 DIAGNOSIS — G43009 Migraine without aura, not intractable, without status migrainosus: Secondary | ICD-10-CM

## 2024-08-15 NOTE — Progress Notes (Unsigned)
 Office: [phone]  /  Fax: [phone]   Initial Visit    [name] was seen in clinic today to evaluate for obesity. She is interested in losing weight to improve overall health and reduce the risk of weight related complications. She presents today to review program treatment options, initial physical assessment, and evaluation.     She was referred by: Self-Referral  When asked what else they would like to accomplish? She states: Adopt a healthier eating pattern and lifestyle, Improve energy levels and physical activity, Improve existing medical conditions, Reduce number of medications, and Improve quality of life  When asked how has your weight affected you? She states: Contributed to medical problems, Contributed to orthopedic problems or mobility issues, Having fatigue, and Having poor endurance  Weight history: [name] is a 58 year old female who presents for an initial obesity treatment consultation.  She has experienced significant weight gain over the past three years, which she attributes to decreased physical activity while caring for her niece. Her current weight is 232 pounds, down from 242 pounds two weeks ago, due to cutting out sodas. She wants to learn healthier eating habits and increase physical activity to avoid additional medications.  She has a history of HIV, diagnosed in 09/14/2004, and was initially very ill, requiring numerous medications. She has been undetectable since 09-14-2012 and attributes her health to prayer and adherence to her medication regimen. Initially, she took approximately twenty pills a day but has since reduced this number significantly. She also used marijuana to manage medication side effects, specifically nausea and vomiting.  She has a history of smoking two packs of cigarettes a day but quit twenty years ago. She also has a history of drug and alcohol use but has since ceased these activities. She has experienced significant stress and  loss, including the death of her only child in 09-14-10, which has impacted her sleep and emotional well-being. She reports snoring and poor sleep quality, which may be related to her history of working third shift for thirteen years.  Her family history includes diabetes but no obesity. She is concerned about her high cholesterol and blood pressure, which she is managing with dietary changes, such as reducing fried foods and increasing baked and barbecued options. She has not taken her blood pressure medication today due to not eating, as she experiences nausea if she takes medication without food.  Highest weight: 242lbs  Some associated conditions: Hypertension, Hyperlipidemia, Prediabetes, and Other: antiviral therapy for HIV   Contributing factors: disruption of circadian rhythm / sleep disordered breathing, consumption of processed foods, use of obesogenic medications: Beta-blockers, moderate to high levels of stress, reduced physical activity, menopause, and slow metabolism for age  Weight promoting medications identified: Beta-blockers  Prior weight loss attempts: {emweightlossprograms:31590::None}  Current nutrition plan: Low-carb  Current level of physical activity: None  Current or previous pharmacotherapy: {EM previousRx:28311}  Response to medication: {EMResponsetomedication:28312}   Past medical history includes:   Past Medical History:  Diagnosis Date   Anemia    Depression with anxiety 05/13/2021   GERD (gastroesophageal reflux disease)    Grieving 05/13/2021   Headache    hx of migraines    History of cocaine abuse (HCC)    HIV disease (HCC)    HIV infection (HCC)    Hyperlipidemia 08/18/2021   Hypertension    not on medication    Incarcerated incisional hernia s/p lap VWH repair 01/02/2015 12/18/2014   Muscle spasm 05/11/2017   Myalgia 02/02/2023  Myocardial infarction [name])    mild MI 09/2004     no heart problems since   Neck swelling 11/16/2017    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  Body Fat%:44.8%, Visceral Fat Rating:14, Weight trend over the last 12 months: Increasing  General:  Alert, oriented and cooperative. Patient is in no acute distress.  Respiratory: Normal respiratory effort, no problems with respiration noted   Gait: able to ambulate independently  Mental Status: Normal mood and affect. Normal behavior. Normal judgment and thought content.   DIAGNOSTIC DATA REVIEWED:  BMET    Component Value Date/Time   NA 138 01/24/2024 1008   K 4.2 01/24/2024 1008   CL 103 01/24/2024 1008   CO2 26 01/24/2024 1008   GLUCOSE 109 (H) 01/24/2024 1008   BUN 13 01/24/2024 1008   CREATININE 1.01 01/24/2024 1008   CALCIUM  9.5 01/24/2024 1008   GFRNONAA >60 12/21/2021 1407   GFRNONAA 53 (L) 01/19/2018 1525   GFRAA 61 01/19/2018 1525   Lab Results  Component Value Date   HGBA1C 6.6 (H) 07/25/2024   HGBA1C 6.1 (H) 01/06/2021   No results found for: INSULIN CBC    Component Value Date/Time   WBC 9.4 07/25/2024 1041   RBC 4.41 07/25/2024 1041   HGB 12.3 07/25/2024 1041   HGB 13.0 02/22/2022 0930   HCT 38.4 07/25/2024 1041   HCT 41.6 02/22/2022 0930   PLT 437 (H) 07/25/2024 1041   PLT 486 (H) 02/22/2022 0930   MCV 87.1 07/25/2024 1041   MCV 89 02/22/2022 0930   MCH 27.9 07/25/2024 1041   MCHC 32.0 07/25/2024 1041   RDW 14.2 07/25/2024 1041   RDW 13.5 02/22/2022 0930   Iron/TIBC/Ferritin/ %Sat No results found for: IRON, TIBC, FERRITIN, IRONPCTSAT Lipid Panel     Component Value Date/Time   CHOL 174 07/25/2024 1041   TRIG 145 07/25/2024 1041   HDL 40 (L) 07/25/2024 1041    CHOLHDL 4.4 07/25/2024 1041   VLDL 33 01/06/2021 2137   LDLCALC 108 (H) 07/25/2024 1041   LDLDIRECT 163 (H) 09/13/2022 0917   Hepatic Function Panel     Component Value Date/Time   PROT 7.3 01/24/2024 1008   ALBUMIN 4.3 09/16/2021 0915   AST 19 01/24/2024 1008   ALT 26 01/24/2024 1008   ALKPHOS 93 09/16/2021 0915   BILITOT 0.3 01/24/2024 1008      Component Value Date/Time   TSH 0.866 04/19/2012 1541     Assessment and Plan   Migraine without aura and without status migrainosus, not intractable  Excessive daytime sleepiness  Trigeminal neuralgia  Benign essential HTN  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 Body composition analysis shows 44.8% body fat and visceral fat rating of  14, indicating increased risk for type 2 diabetes and heart disease. Decreased physical activity over the past three years due to caregiving responsibilities. Motivated to lose weight through lifestyle changes, interested in healthier eating patterns and increased physical activity to improve health and reduce medication use. - Schedule metabolism test to determine caloric needs for weight loss. - Develop personalized nutrition and exercise plan. - Schedule fasting appointment for further testing. - Follow up every two weeks for the first three months to monitor progress and adjust plan.  Hypertension Hypertension requiring better control. Blood pressure medication not taken today due to dietary restrictions related to menopause. Interested in managing blood pressure through lifestyle changes. - Monitor blood pressure regularly. - Incorporate dietary changes to support blood pressure control. - Discuss  medication management with primary care provider.  Hyperlipidemia Hyperlipidemia requiring dietary modifications to lower cholesterol levels. Reducing fried food intake and motivated to continue dietary changes. - Continue dietary modifications to reduce cholesterol intake. - Monitor lipid levels regularly.  General Health Maintenance Proactive about health, including vaccinations and avoiding COVID-19. Interested in learning more about healthy eating and exercise to improve overall well-being. - Encourage continued vaccination adherence. - Provide education on healthy eating and exercise.        Obesity Treatment / Action Plan:  Patient will work on garnering support from family and friends to begin weight loss journey. Will work on eliminating or reducing the presence of highly palatable, calorie dense foods in the home. Will complete provided nutritional and psychosocial assessment questionnaire before the next appointment. Will be scheduled for indirect calorimetry to determine resting energy expenditure in a fasting state.  This will allow us  to create a reduced calorie, high-protein meal plan to promote loss of fat mass while preserving muscle mass. Will think about ideas on how to incorporate physical activity into their daily routine. Will work on reducing intake of added sugars, simple sugars and processed carbs. Will reduce liquid calories and sugary drinks from diet. Will reduce the frequency of eating out and making healthier choices by advanced menu planning. Will work on managing stress via relaxation methods as this may result in unhealthy eating patterns. Will work on reading labels, making healthier choices and watching portion sizes. Counseled on the health benefits of losing 5%-15% of total body weight. Will work on improving sleep hygiene and trying to obtain at least 7 hours of sleep. Was counseled on nutritional approaches to weight loss and benefits of reducing  processed foods and consuming plant-based foods and high quality protein as part of nutritional weight management. Was counseled on pharmacotherapy and role as an adjunct in weight management.   Obesity Education Performed Today:  She was weighed on the bioimpedance scale and results were discussed and documented in the synopsis.  We discussed obesity as a disease and the importance of a more detailed evaluation of all the factors contributing to the disease.  We discussed the importance of long term lifestyle changes which include nutrition, exercise and behavioral modifications as well as the importance of customizing this to her specific health and social needs.  We discussed the benefits of reaching a healthier weight to alleviate the symptoms of existing conditions and reduce the risks of the biomechanical, metabolic and psychological effects of obesity.  We reviewed the four pillars of obesity medicine and importance of using a multimodal approach.  We reviewed the basic principles in weight management.   Jovanka Westgate appears to be in the action stage of change and states  they are ready to start intensive lifestyle modifications and behavioral modifications.  I have spent 39 minutes in the care of the patient today including: 3 minutes before the visit reviewing and preparing the chart. 27 minutes face-to-face assessing and reviewing listed medical problems as outlined in obesity care plan, providing nutritional and behavioral counseling on topics outlined in the obesity care plan, independently interpreting test results and goals of care, as described in assessment and plan, reviewing and discussing biometric information and progress, and reviewing latest PCP notes and specialist consultations 9 minutes after the visit updating chart and documentation of encounter.  Reviewed by clinician on day of visit: allergies, medications, problem list, medical history, surgical history,  family history, social history, and previous encounter notes pertinent to obesity diagnosis.   Minnetta Sandora,PA-C

## 2024-10-01 ENCOUNTER — Ambulatory Visit: Admitting: Student

## 2024-10-16 ENCOUNTER — Ambulatory Visit: Admitting: Family Medicine

## 2024-10-26 ENCOUNTER — Other Ambulatory Visit: Payer: Self-pay | Admitting: Infectious Disease

## 2024-10-29 NOTE — Telephone Encounter (Signed)
Okay to refill cyclobenzaprine.

## 2024-12-02 ENCOUNTER — Other Ambulatory Visit: Payer: Self-pay | Admitting: Infectious Disease

## 2024-12-03 NOTE — Telephone Encounter (Signed)
 Okay to refill.

## 2024-12-04 NOTE — Telephone Encounter (Signed)
 Patient stated that she takes it PRN - only when she has muscle spasms in her legs, thinks it is due to rosuvastatin . Refill sent to pharmacy.

## 2025-01-22 ENCOUNTER — Ambulatory Visit: Admitting: Infectious Disease

## 2025-02-18 ENCOUNTER — Ambulatory Visit: Admitting: Family Medicine
